# Patient Record
Sex: Male | Born: 1935 | Race: White | Hispanic: No | Smoking: Former smoker
Health system: Southern US, Community
[De-identification: ages and names within clinical notes are randomized; demographics above are authoritative.]

## PROBLEM LIST (undated history)

## (undated) DIAGNOSIS — E039 Hypothyroidism, unspecified: Secondary | ICD-10-CM

## (undated) DIAGNOSIS — J302 Other seasonal allergic rhinitis: Secondary | ICD-10-CM

## (undated) DIAGNOSIS — N200 Calculus of kidney: Principal | ICD-10-CM

## (undated) DIAGNOSIS — Z87442 Personal history of urinary calculi: Secondary | ICD-10-CM

## (undated) DIAGNOSIS — H3530 Unspecified macular degeneration: Secondary | ICD-10-CM

## (undated) DIAGNOSIS — I452 Bifascicular block: Secondary | ICD-10-CM

## (undated) DIAGNOSIS — M545 Low back pain, unspecified: Secondary | ICD-10-CM

## (undated) DIAGNOSIS — E079 Disorder of thyroid, unspecified: Secondary | ICD-10-CM

## (undated) DIAGNOSIS — M1990 Unspecified osteoarthritis, unspecified site: Secondary | ICD-10-CM

## (undated) DIAGNOSIS — I10 Essential (primary) hypertension: Secondary | ICD-10-CM

## (undated) DIAGNOSIS — Z8619 Personal history of other infectious and parasitic diseases: Secondary | ICD-10-CM

## (undated) DIAGNOSIS — C801 Malignant (primary) neoplasm, unspecified: Secondary | ICD-10-CM

## (undated) HISTORY — PX: CATARACT EXTRACTION: SUR2

## (undated) HISTORY — DX: Low back pain: M54.5

## (undated) HISTORY — DX: Calculus of kidney: N20.0

## (undated) HISTORY — DX: Low back pain, unspecified: M54.50

## (undated) HISTORY — DX: Bifascicular block: I45.2

## (undated) HISTORY — DX: Unspecified osteoarthritis, unspecified site: M19.90

## (undated) HISTORY — PX: COLONOSCOPY: SHX174

## (undated) HISTORY — DX: Disorder of thyroid, unspecified: E07.9

## (undated) HISTORY — DX: Essential (primary) hypertension: I10

## (undated) HISTORY — PX: KIDNEY STONE SURGERY: SHX686

---
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
## 1971-09-08 HISTORY — PX: OTHER SURGICAL HISTORY: SHX169

## 1992-09-07 HISTORY — PX: OTHER SURGICAL HISTORY: SHX169

## 1993-07-08 HISTORY — PX: OTHER SURGICAL HISTORY: SHX169

## 2003-01-30 LAB — HM COLONOSCOPY

## 2003-12-26 ENCOUNTER — Encounter: Payer: Self-pay | Admitting: Internal Medicine

## 2003-12-27 ENCOUNTER — Encounter: Admission: RE | Admit: 2003-12-27 | Discharge: 2003-12-27 | Payer: Self-pay | Admitting: Internal Medicine

## 2004-01-15 ENCOUNTER — Encounter (HOSPITAL_COMMUNITY): Admission: RE | Admit: 2004-01-15 | Discharge: 2004-04-14 | Payer: Self-pay | Admitting: Internal Medicine

## 2004-01-30 ENCOUNTER — Encounter: Payer: Self-pay | Admitting: Internal Medicine

## 2004-02-04 ENCOUNTER — Encounter (INDEPENDENT_AMBULATORY_CARE_PROVIDER_SITE_OTHER): Payer: Self-pay | Admitting: Specialist

## 2004-02-04 ENCOUNTER — Ambulatory Visit (HOSPITAL_COMMUNITY): Admission: RE | Admit: 2004-02-04 | Discharge: 2004-02-04 | Payer: Self-pay | Admitting: Internal Medicine

## 2004-02-26 ENCOUNTER — Ambulatory Visit (HOSPITAL_COMMUNITY): Admission: RE | Admit: 2004-02-26 | Discharge: 2004-02-26 | Payer: Self-pay | Admitting: Endocrinology

## 2004-03-06 ENCOUNTER — Ambulatory Visit (HOSPITAL_COMMUNITY): Admission: RE | Admit: 2004-03-06 | Discharge: 2004-03-06 | Payer: Self-pay | Admitting: Internal Medicine

## 2004-03-06 ENCOUNTER — Encounter (INDEPENDENT_AMBULATORY_CARE_PROVIDER_SITE_OTHER): Payer: Self-pay | Admitting: Specialist

## 2004-07-15 ENCOUNTER — Ambulatory Visit: Payer: Self-pay | Admitting: Internal Medicine

## 2004-08-13 ENCOUNTER — Ambulatory Visit: Payer: Self-pay | Admitting: Internal Medicine

## 2004-08-20 ENCOUNTER — Ambulatory Visit: Payer: Self-pay | Admitting: Internal Medicine

## 2004-08-27 ENCOUNTER — Ambulatory Visit: Payer: Self-pay | Admitting: Internal Medicine

## 2004-09-07 ENCOUNTER — Emergency Department (HOSPITAL_COMMUNITY): Admission: EM | Admit: 2004-09-07 | Discharge: 2004-09-07 | Payer: Self-pay | Admitting: Family Medicine

## 2004-09-11 ENCOUNTER — Ambulatory Visit: Payer: Self-pay | Admitting: Internal Medicine

## 2004-12-16 ENCOUNTER — Ambulatory Visit: Payer: Self-pay | Admitting: Internal Medicine

## 2004-12-26 ENCOUNTER — Ambulatory Visit: Payer: Self-pay | Admitting: Internal Medicine

## 2005-01-06 ENCOUNTER — Ambulatory Visit: Payer: Self-pay | Admitting: Internal Medicine

## 2005-01-09 ENCOUNTER — Ambulatory Visit: Payer: Self-pay | Admitting: Internal Medicine

## 2005-01-16 ENCOUNTER — Ambulatory Visit: Payer: Self-pay | Admitting: Internal Medicine

## 2005-01-21 ENCOUNTER — Ambulatory Visit: Payer: Self-pay | Admitting: Internal Medicine

## 2005-02-12 ENCOUNTER — Ambulatory Visit: Payer: Self-pay | Admitting: Internal Medicine

## 2005-04-23 ENCOUNTER — Ambulatory Visit: Payer: Self-pay | Admitting: Internal Medicine

## 2005-07-11 ENCOUNTER — Ambulatory Visit: Payer: Self-pay | Admitting: Internal Medicine

## 2005-12-10 ENCOUNTER — Ambulatory Visit: Payer: Self-pay | Admitting: Internal Medicine

## 2005-12-17 ENCOUNTER — Ambulatory Visit: Payer: Self-pay | Admitting: Internal Medicine

## 2006-02-04 ENCOUNTER — Ambulatory Visit: Payer: Self-pay | Admitting: Internal Medicine

## 2006-02-22 ENCOUNTER — Encounter: Admission: RE | Admit: 2006-02-22 | Discharge: 2006-02-22 | Payer: Self-pay | Admitting: Internal Medicine

## 2006-06-15 ENCOUNTER — Ambulatory Visit: Payer: Self-pay | Admitting: Internal Medicine

## 2006-06-15 LAB — CONVERTED CEMR LAB
ALT: 21 units/L (ref 0–40)
Albumin: 3.5 g/dL (ref 3.5–5.2)
Alkaline Phosphatase: 62 units/L (ref 39–117)
Cholesterol: 131 mg/dL (ref 0–200)
LDL Cholesterol: 75 mg/dL (ref 0–99)
Total Protein: 6.8 g/dL (ref 6.0–8.3)

## 2006-06-22 ENCOUNTER — Ambulatory Visit: Payer: Self-pay | Admitting: Internal Medicine

## 2006-12-21 ENCOUNTER — Ambulatory Visit: Payer: Self-pay | Admitting: Internal Medicine

## 2006-12-21 LAB — CONVERTED CEMR LAB
ALT: 20 units/L (ref 0–40)
AST: 19 units/L (ref 0–37)
Alkaline Phosphatase: 61 units/L (ref 39–117)
BUN: 14 mg/dL (ref 6–23)
Basophils Relative: 1.6 % — ABNORMAL HIGH (ref 0.0–1.0)
Bilirubin, Direct: 0.1 mg/dL (ref 0.0–0.3)
CO2: 28 meq/L (ref 19–32)
Calcium: 9 mg/dL (ref 8.4–10.5)
Chloride: 109 meq/L (ref 96–112)
Creatinine, Ser: 0.8 mg/dL (ref 0.4–1.5)
Eosinophils Relative: 2.1 % (ref 0.0–5.0)
Glucose, Bld: 104 mg/dL — ABNORMAL HIGH (ref 70–99)
LDL Cholesterol: 59 mg/dL (ref 0–99)
Lymphocytes Relative: 29.1 % (ref 12.0–46.0)
Neutro Abs: 2.7 10*3/uL (ref 1.4–7.7)
Platelets: 231 10*3/uL (ref 150–400)
RBC: 4.6 M/uL (ref 4.22–5.81)
RDW: 12.7 % (ref 11.5–14.6)
Total Bilirubin: 0.7 mg/dL (ref 0.3–1.2)
Total Protein: 6.6 g/dL (ref 6.0–8.3)
Triglycerides: 43 mg/dL (ref 0–149)
VLDL: 9 mg/dL (ref 0–40)
WBC: 4.8 10*3/uL (ref 4.5–10.5)

## 2006-12-28 ENCOUNTER — Ambulatory Visit: Payer: Self-pay | Admitting: Internal Medicine

## 2007-02-28 ENCOUNTER — Ambulatory Visit: Payer: Self-pay | Admitting: Internal Medicine

## 2007-03-30 ENCOUNTER — Telehealth (INDEPENDENT_AMBULATORY_CARE_PROVIDER_SITE_OTHER): Payer: Self-pay | Admitting: *Deleted

## 2007-06-20 DIAGNOSIS — I10 Essential (primary) hypertension: Secondary | ICD-10-CM | POA: Insufficient documentation

## 2007-06-20 DIAGNOSIS — N200 Calculus of kidney: Secondary | ICD-10-CM

## 2007-06-20 HISTORY — DX: Calculus of kidney: N20.0

## 2007-07-01 ENCOUNTER — Ambulatory Visit: Payer: Self-pay | Admitting: Internal Medicine

## 2007-07-01 DIAGNOSIS — M545 Low back pain, unspecified: Secondary | ICD-10-CM | POA: Insufficient documentation

## 2007-07-01 DIAGNOSIS — M1990 Unspecified osteoarthritis, unspecified site: Secondary | ICD-10-CM | POA: Insufficient documentation

## 2007-09-08 HISTORY — PX: ROTATOR CUFF REPAIR: SHX139

## 2007-10-10 ENCOUNTER — Ambulatory Visit: Payer: Self-pay | Admitting: Internal Medicine

## 2007-10-10 DIAGNOSIS — M7500 Adhesive capsulitis of unspecified shoulder: Secondary | ICD-10-CM | POA: Insufficient documentation

## 2007-10-14 ENCOUNTER — Telehealth: Payer: Self-pay | Admitting: Internal Medicine

## 2007-10-21 ENCOUNTER — Encounter: Admission: RE | Admit: 2007-10-21 | Discharge: 2007-10-21 | Payer: Self-pay | Admitting: Internal Medicine

## 2007-10-25 DIAGNOSIS — M67919 Unspecified disorder of synovium and tendon, unspecified shoulder: Secondary | ICD-10-CM | POA: Insufficient documentation

## 2007-10-25 DIAGNOSIS — M719 Bursopathy, unspecified: Secondary | ICD-10-CM

## 2007-10-26 ENCOUNTER — Telehealth (INDEPENDENT_AMBULATORY_CARE_PROVIDER_SITE_OTHER): Payer: Self-pay | Admitting: *Deleted

## 2007-11-10 ENCOUNTER — Encounter: Payer: Self-pay | Admitting: Internal Medicine

## 2007-12-19 ENCOUNTER — Ambulatory Visit: Payer: Self-pay | Admitting: Internal Medicine

## 2007-12-19 LAB — CONVERTED CEMR LAB
Basophils Absolute: 0 10*3/uL (ref 0.0–0.1)
Basophils Relative: 0.4 % (ref 0.0–1.0)
Eosinophils Absolute: 0.3 10*3/uL (ref 0.0–0.7)
Eosinophils Relative: 6.8 % — ABNORMAL HIGH (ref 0.0–5.0)
HDL: 49.6 mg/dL (ref >39.0)
LDL Cholesterol: 79 mg/dL (ref 0–99)
Lymphocytes Relative: 36.7 % (ref 12.0–46.0)
MCV: 91.8 fL (ref 78.0–100.0)
Neutrophils Relative %: 43.8 % (ref 43.0–77.0)
Platelets: 240 10*3/uL (ref 150–400)
RBC: 4.44 M/uL (ref 4.22–5.81)
VLDL: 4 mg/dL (ref 0–40)
WBC: 4.6 10*3/uL (ref 4.5–10.5)

## 2007-12-26 ENCOUNTER — Ambulatory Visit: Payer: Self-pay | Admitting: Internal Medicine

## 2008-06-19 ENCOUNTER — Ambulatory Visit: Payer: Self-pay | Admitting: Internal Medicine

## 2008-06-26 ENCOUNTER — Ambulatory Visit: Payer: Self-pay | Admitting: Internal Medicine

## 2008-06-26 DIAGNOSIS — R498 Other voice and resonance disorders: Secondary | ICD-10-CM | POA: Insufficient documentation

## 2008-06-29 ENCOUNTER — Encounter: Admission: RE | Admit: 2008-06-29 | Discharge: 2008-06-29 | Payer: Self-pay | Admitting: Internal Medicine

## 2008-07-10 ENCOUNTER — Encounter: Payer: Self-pay | Admitting: Internal Medicine

## 2008-07-12 ENCOUNTER — Telehealth: Payer: Self-pay | Admitting: Internal Medicine

## 2008-08-20 ENCOUNTER — Encounter: Payer: Self-pay | Admitting: Internal Medicine

## 2008-09-07 HISTORY — PX: THYROIDECTOMY: SHX17

## 2008-10-16 ENCOUNTER — Ambulatory Visit (HOSPITAL_COMMUNITY): Admission: RE | Admit: 2008-10-16 | Discharge: 2008-10-17 | Payer: Self-pay | Admitting: Surgery

## 2008-10-16 ENCOUNTER — Encounter (INDEPENDENT_AMBULATORY_CARE_PROVIDER_SITE_OTHER): Payer: Self-pay | Admitting: Surgery

## 2008-10-16 ENCOUNTER — Encounter: Payer: Self-pay | Admitting: Internal Medicine

## 2008-10-23 ENCOUNTER — Encounter: Payer: Self-pay | Admitting: Internal Medicine

## 2008-10-26 ENCOUNTER — Ambulatory Visit: Payer: Self-pay | Admitting: Oncology

## 2008-10-29 ENCOUNTER — Encounter: Payer: Self-pay | Admitting: Internal Medicine

## 2008-11-07 ENCOUNTER — Encounter (HOSPITAL_COMMUNITY): Admission: RE | Admit: 2008-11-07 | Discharge: 2009-01-11 | Payer: Self-pay | Admitting: Oncology

## 2008-11-20 ENCOUNTER — Ambulatory Visit: Payer: Self-pay | Admitting: Endocrinology

## 2008-11-20 DIAGNOSIS — C73 Malignant neoplasm of thyroid gland: Secondary | ICD-10-CM | POA: Insufficient documentation

## 2008-11-20 DIAGNOSIS — E890 Postprocedural hypothyroidism: Secondary | ICD-10-CM | POA: Insufficient documentation

## 2008-11-27 ENCOUNTER — Encounter: Payer: Self-pay | Admitting: Endocrinology

## 2008-12-17 ENCOUNTER — Ambulatory Visit: Payer: Self-pay | Admitting: Endocrinology

## 2008-12-19 LAB — CONVERTED CEMR LAB: TSH: 1.7 microintl units/mL (ref 0.35–5.50)

## 2008-12-21 ENCOUNTER — Telehealth (INDEPENDENT_AMBULATORY_CARE_PROVIDER_SITE_OTHER): Payer: Self-pay | Admitting: *Deleted

## 2008-12-27 ENCOUNTER — Ambulatory Visit: Payer: Self-pay | Admitting: Internal Medicine

## 2009-02-22 ENCOUNTER — Encounter: Payer: Self-pay | Admitting: Internal Medicine

## 2009-02-22 ENCOUNTER — Ambulatory Visit: Payer: Self-pay | Admitting: Internal Medicine

## 2009-02-22 DIAGNOSIS — C44599 Other specified malignant neoplasm of skin of other part of trunk: Secondary | ICD-10-CM | POA: Insufficient documentation

## 2009-03-27 ENCOUNTER — Encounter: Payer: Self-pay | Admitting: Internal Medicine

## 2009-03-27 ENCOUNTER — Ambulatory Visit: Payer: Self-pay | Admitting: Internal Medicine

## 2009-04-11 ENCOUNTER — Encounter: Payer: Self-pay | Admitting: Endocrinology

## 2009-06-11 ENCOUNTER — Encounter: Payer: Self-pay | Admitting: Internal Medicine

## 2009-10-16 ENCOUNTER — Encounter: Payer: Self-pay | Admitting: Internal Medicine

## 2009-12-24 ENCOUNTER — Ambulatory Visit: Payer: Self-pay | Admitting: Internal Medicine

## 2009-12-24 LAB — CONVERTED CEMR LAB
Albumin: 3.6 g/dL (ref 3.5–5.2)
BUN: 17 mg/dL (ref 6–23)
Basophils Absolute: 0.1 10*3/uL (ref 0.0–0.1)
Bilirubin Urine: NEGATIVE
Blood in Urine, dipstick: NEGATIVE
CO2: 27 meq/L (ref 19–32)
Calcium: 8.8 mg/dL (ref 8.4–10.5)
Cholesterol: 129 mg/dL (ref 0–200)
Eosinophils Absolute: 0.2 10*3/uL (ref 0.0–0.7)
Glucose, Bld: 94 mg/dL (ref 70–99)
Glucose, Urine, Semiquant: NEGATIVE
HCT: 39.6 % (ref 39.0–52.0)
HDL: 62.5 mg/dL (ref >39.00)
Hemoglobin: 13.5 g/dL (ref 13.0–17.0)
Ketones, urine, test strip: NEGATIVE
Lymphocytes Relative: 32.7 % (ref 12.0–46.0)
Lymphs Abs: 1.4 10*3/uL (ref 0.7–4.0)
MCHC: 34.1 g/dL (ref 30.0–36.0)
Neutro Abs: 2.3 10*3/uL (ref 1.4–7.7)
RDW: 14.2 % (ref 11.5–14.6)
Sodium: 142 meq/L (ref 135–145)
Specific Gravity, Urine: 1.02
TSH: 5.58 microintl units/mL — ABNORMAL HIGH (ref 0.35–5.50)
WBC Urine, dipstick: NEGATIVE
pH: 5.5

## 2010-01-02 ENCOUNTER — Ambulatory Visit: Payer: Self-pay | Admitting: Internal Medicine

## 2010-01-03 ENCOUNTER — Ambulatory Visit: Payer: Self-pay | Admitting: Internal Medicine

## 2010-03-24 ENCOUNTER — Ambulatory Visit: Payer: Self-pay | Admitting: Internal Medicine

## 2010-03-24 LAB — CONVERTED CEMR LAB: TSH: 52.93 microintl units/mL — ABNORMAL HIGH (ref 0.35–5.50)

## 2010-03-25 ENCOUNTER — Encounter (HOSPITAL_COMMUNITY): Admission: RE | Admit: 2010-03-25 | Discharge: 2010-06-05 | Payer: Self-pay | Admitting: Internal Medicine

## 2010-06-16 ENCOUNTER — Telehealth: Payer: Self-pay | Admitting: Internal Medicine

## 2010-06-17 ENCOUNTER — Ambulatory Visit: Payer: Self-pay | Admitting: Internal Medicine

## 2010-09-28 ENCOUNTER — Encounter: Payer: Self-pay | Admitting: Internal Medicine

## 2010-10-05 LAB — CONVERTED CEMR LAB
ALT: 20 units/L (ref 0–53)
BUN: 14 mg/dL (ref 6–23)
Basophils Absolute: 0 10*3/uL (ref 0.0–0.1)
Bilirubin, Direct: 0.1 mg/dL (ref 0.0–0.3)
Chloride: 107 meq/L (ref 96–112)
Cholesterol: 144 mg/dL (ref 0–200)
Creatinine, Ser: 0.8 mg/dL (ref 0.4–1.5)
Eosinophils Absolute: 0.2 10*3/uL (ref 0.0–0.7)
Glucose, Bld: 102 mg/dL — ABNORMAL HIGH (ref 70–99)
HCT: 44 % (ref 39.0–52.0)
LDL Cholesterol: 80 mg/dL (ref 0–99)
Lymphs Abs: 1.7 10*3/uL (ref 0.7–4.0)
MCHC: 34.7 g/dL (ref 30.0–36.0)
MCV: 91.5 fL (ref 78.0–100.0)
Monocytes Absolute: 0.5 10*3/uL (ref 0.1–1.0)
Neutrophils Relative %: 56.3 % (ref 43.0–77.0)
Platelets: 226 10*3/uL (ref 150.0–400.0)
Potassium: 4.2 meq/L (ref 3.5–5.1)
RDW: 13.9 % (ref 11.5–14.6)
TSH: 1.52 microintl units/mL (ref 0.35–5.50)
Total Bilirubin: 0.9 mg/dL (ref 0.3–1.2)
Triglycerides: 37 mg/dL (ref 0.0–149.0)
VLDL: 7.4 mg/dL (ref 0.0–40.0)

## 2010-10-07 NOTE — Letter (Signed)
Summary: [facility]   Imported By: Maryln Gottron 11/04/2009 12:49:28  _____________________________________________________________________  External Attachment:    Type:   Image     Comment:   External Document

## 2010-10-07 NOTE — Assessment & Plan Note (Signed)
Summary: hip inj/ok per bonnie/njr   Vital Signs:  Patient profile:   77 year old male Height:      68 inches Weight:      196 pounds BMI:     29.91 Temp:     98.2 degrees F oral Pulse rate:   72 / minute Resp:     14 per minute BP sitting:   130 / 80  (left arm)  Vitals Entered By: Willy Eddy, LPN (June 17, 2010 4:31 PM) CC: c/o left hip pain, Hypertension Management Is Patient Diabetic? No   Primary Care [place]:  Stacie Glaze MD  CC:  c/o left hip pain and Hypertension Management.  History of Present Illness: the pt has increased  hip pain fro several months that has been responding to motrin 600 for daily use and this is controlling pain Back dz is a contributing factor he is requestimg evaluation fro a hip injection blood pressure stable on the nsaids  Hypertension History:      He denies headache, chest pain, palpitations, dyspnea with exertion, orthopnea, PND, peripheral edema, visual symptoms, neurologic problems, syncope, and side effects from treatment.        Positive major cardiovascular risk factors include male age 59 years old or older and hypertension.  Negative major cardiovascular risk factors include negative family history for ischemic heart disease and non-tobacco-user status.     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) 5)  Hoarseness, Chronic  (ICD-784.49) 6)  Preventive Health Care  (ICD-V70.0) 7)  Rotator Cuff Syndrome, Left  (ICD-726.10) 8)  Adhesive Capsulitis of Shoulder  (ICD-726.0) 9)  Osteoarthritis  (ICD-715.90) 10)  Low Back Pain  (ICD-724.2) 11)   Hypertension  (ICD-401.9) 12)  Gout  (ICD-274.9) 13)  Family History Diabetes 1st Degree Relative  (ICD-V18.0) 14)  Family History of Cad Male 1st Degree Relative <50  (ICD-V17.3)  Current Problems (verified): 1)  Hip Pain, Left  (ICD-719.45) 2)  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 Once Daily 2)  Adult Aspirin Low Strength 81 Mg  Tbdp (Aspirin) .... Once Daily 3)  Lotensin 20 Mg  Tabs (Benazepril Hcl) .... One  By Mouth Daily 4)  Icaps  Caps (Multiple Vitamins-Minerals) .... 2 Once Daily 5)  Fish Oil 1000 Mg Caps (Omega-3 Fatty Acids) .Marland Kitchen.. 1 Once Daily 6)  Levothyroxine Sodium 150 Mcg Tabs (Levothyroxine Sodium) .... One By Mouth Daily( Overrides The )  Current Medications (verified): 1)  Allopurinol 300 Mg  Tabs (Allopurinol) .... Once Daily 2)  Adult Aspirin Low Strength 81 Mg  Tbdp (Aspirin) .... Once Daily 3)  Lotensin 20 Mg  Tabs (Benazepril Hcl) .... One  By Mouth Daily 4)  Icaps  Caps (Multiple Vitamins-Minerals) .... 2 Once Daily 5)  Fish Oil 1000 Mg Caps (Omega-3 Fatty Acids) .Marland Kitchen.. 1 Once Daily 6)  Levothyroxine Sodium 150 Mcg Tabs (Levothyroxine Sodium) .... One By Mouth Daily( Overrides The ) 7)  Avastin .05 in Eye Monthly  Allergies (verified): 1)  ! Pcn  Past History:  Family History: Last updated: 06/20/2007 Family History Thyroid  disease Family History of Anemia/FE deficiency Family History of CAD Male 1st degree relative <50 Family History Diabetes 1st degree relative  Social History: Last updated: 07/01/2007 Retired Single Former Smoker Alcohol use-yes Domestic Partner  Risk Factors: Smoking Status: quit (06/17/2010) Passive Smoke Exposure: no (06/17/2010)  Past medical, surgical, family and social histories (including risk factors) reviewed, and no changes noted (except as noted below).  Past Medical History: Reviewed history from 12/27/2008 and no changes required. Gout Hypertension Low back pain Osteoarthritis Hypothyroidism right bundle branch block  Past Surgical History: Reviewed history from 12/26/2007 and no changes required. Colonoscopy Appendectomy Rotator cuff repair  Family History: Reviewed history from 06/20/2007 and no changes required. Family History Thyroid disease Family History of Anemia/FE deficiency Family History of CAD Male 1st degree relative <50 Family History Diabetes 1st degree relative  Social History: Reviewed history from 07/01/2007 and no changes required. 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    Physical Exam  General:  Well developed, well nourished, in no acute distress.  Head:  head: no deformity eyes: no periorbital swelling, no proptosis external nose and ears are normal mouth: no lesion seen  Eyes:   pupils equal and pupils round.   Ears:  R ear normal and L ear normal.   Nose:  no nasal discharge.  no mucosal edema.   Mouth:  pharynx pink and moist.   Neck:  a healed scar is present.  i do not appreciate a nodule in the thyroid or elsewhere in the neck  Lungs:  Clear to auscultation bilaterally. Normal respiratory effort.  Heart:  Regular rate and rhythm without murmurs or gallops noted. Normal S1,S2.   Msk:  tender groin tendon no mass or adenpopathy pai n with eversion of hipno joint swelling, no joint warmth, no redness over joints, decreased ROM, and joint tenderness.   Neurologic:  No cranial nerve deficits noted. Station and gait are normal. Plantar reflexes are down-going bilaterally. DTRs are symmetrical throughout. Sensory, motor and coordinative functions appear intact.   Impression & Recommendations:  Problem # 1:  HIP PAIN, LEFT (ICD-719.45) Assessment Deteriorated Informed consent obtained and then the ligamient in the femoral triagle were palpated and the groin tendon  was prepped in a sterile manor and 40 mg depo and 1/2 cc 1% lidocaine injected into the synovial space. After care discussed. Pt tolerated procedure well.  His updated medication list for this problem includes:    Adult Aspirin Low Strength 81 Mg Tbdp (Aspirin) ..... Once daily  Discussed use  of medications, application of heat or cold, and exercises.   Orders: Injection, Tendon / Ligament ([address]) Depo- Medrol 40mg  (J1030)  Problem # 2:  LOW BACK PAIN (ICD-724.2) Assessment: Unchanged  His updated medication list for this problem includes:    Adult Aspirin Low Strength 81 Mg Tbdp (Aspirin) ..... Once daily  Discussed use of moist heat or ice, modified activities, medications, and stretching/strengthening exercises. Back care instructions given. To be seen in 2 weeks if no improvement; sooner if worsening of symptoms.   Problem # 3:  HYPERTENSION (ICD-401.9) Assessment: Unchanged  His updated  medication list for this problem includes:    Lotensin 20 Mg Tabs (Benazepril hcl) ..... One  by mouth daily  BP today: 130/80 Prior BP: 116/80 (01/02/2010)  10 Yr Risk Heart Disease: 9 % Prior 10 Yr Risk Heart Disease: 7 % (01/02/2010)  Labs Reviewed: K+: 4.4 (12/24/2009) Creat: : 0.8 (12/24/2009)   Chol: 129 (12/24/2009)   HDL: 62.50 (12/24/2009)   LDL: 59 (12/24/2009)   TG: 40.0 (12/24/2009)  Complete Medication List: 1)  Allopurinol 300 Mg Tabs (Allopurinol) .... Once daily 2)  Adult Aspirin Low Strength 81 Mg Tbdp (Aspirin) .... Once daily 3)  Lotensin 20 Mg Tabs (Benazepril hcl) .... One  by mouth daily 4)  Icaps Caps (Multiple vitamins-minerals) .... 2 once daily 5)  Fish Oil 1000 Mg Caps (Omega-3 fatty acids) .Marland Kitchen.. 1 once daily 6)  Levothyroxine Sodium 150 Mcg Tabs (Levothyroxine sodium) .... One by mouth daily( overrides the ) 7)  Avastin .05 in Eye Monthly   Hypertension Assessment/Plan:      The patient's hypertensive risk group is category B: At least one risk factor (excluding diabetes) with no target organ damage.  His calculated 10 year risk of coronary heart disease is 9 %.  Today's blood pressure is 130/80.  His blood pressure goal is < 140/90.  Patient Instructions: 1)  ice site for 15 min every couple of hours tonight 2)  no exertion for 2-3 days 3)  40 mf of depomedrol in hip joint

## 2010-10-07 NOTE — Assessment & Plan Note (Signed)
Summary: cpx/njr......Jay KitchenPT RSC - BUMPED // RS/PT [facility] FROM BMP/CJR/pt re...   Vital Signs:  Patient profile:   77 year old male Height:      68 inches Weight:      196 pounds BMI:     29.91 Temp:     98.2 degrees F oral Pulse rate:   72 / minute Resp:     14 per minute BP sitting:   116 / 80  (left arm)  Vitals Entered By: Willy Eddy, LPN (January 02, 2010 2:11 PM) CC: annual visit for disease mangement, Hypertension Management   Primary Care Provider:  Stacie Glaze MD  CC:  annual visit for disease mangement and Hypertension Management.  History of Present Illness: In sept he pulled a groin muscle and this has caused increased difficulty with exercize the pain is preceiptated by movements and have a variable duration he has no hx of hip disorder the pt has a hx of surgical hypothroidism with stable prior tsh   The pt was asked about all immunizations, health maint. services that are appropriate to their age and was given guidance on diet exercize  and weight management   Hypertension History:      Positive major cardiovascular risk factors include male age 55 years old or older and hypertension.  Negative major cardiovascular risk factors include negative family history for ischemic heart disease and non-tobacco-user status.    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 Once Daily 2)  Adult Aspirin Low Strength 81 Mg  Tbdp (Aspirin) .... Once Daily 3)  Lotensin 20 Mg  Tabs (Benazepril Hcl) .... One  By Mouth Daily 4)  Icaps  Caps (Multiple Vitamins-Minerals) .... 2 Once Daily 5)  Fish Oil 1000 Mg Caps (Omega-3 Fatty Acids) .Jay Webster.. 1 Once Daily 6)  Levothyroxine Sodium 125 Mcg Tabs (Levothyroxine Sodium) .... Qd  Current Medications (verified): 1)  Allopurinol 300 Mg  Tabs (Allopurinol) .... Once Daily 2)  Adult Aspirin Low Strength 81 Mg  Tbdp (Aspirin) .... Once Daily 3)  Lotensin 20 Mg  Tabs (Benazepril Hcl) .... One  By Mouth Daily 4)  Icaps  Caps (Multiple Vitamins-Minerals) .... 2 Once Daily 5)  Fish Oil 1000 Mg Caps (Omega-3 Fatty Acids) .Jay Webster.. 1 Once Daily 6)  Levothyroxine Sodium 150 Mcg Tabs (Levothyroxine Sodium) .... One By Mouth Daily( Overrides The )  Allergies (verified): 1)  ! 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  Past Medical History: Reviewed history from 12/27/2008 and no changes required. Gout Hypertension Low back pain Osteoarthritis Hypothyroidism right bundle branch block  Past Surgical History: Reviewed history from 12/26/2007 and no changes required. Colonoscopy Appendectomy Rotator cuff repair  Family  History: Reviewed history from 06/20/2007 and no changes required. Family History Thyroid disease Family History of Anemia/FE deficiency Family History of CAD Male 1st degree relative <50 Family History Diabetes 1st degree relative  Social History: Reviewed history from 07/01/2007 and no changes required. 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    Physical Exam  General:  Well developed, well nourished, in no acute distress.  Head:  head: no deformity eyes: no periorbital swelling, no proptosis external nose and ears are normal mouth: no lesion seen  Eyes:  pupils equal and pupils round.   Ears:  R ear normal and L ear normal.   Nose:  no nasal discharge.  no mucosal edema.   Mouth:  pharynx pink and moist.   Neck:  a healed scar is present.  i do not appreciate a nodule in the thyroid or elsewhere in the neck  Chest Wall:  barrel-chest deformity.   Lungs:  Clear to auscultation bilaterally. Normal respiratory effort.  Heart:  Regular rate and rhythm without murmurs or gallops noted. Normal S1,S2.   Abdomen:  soft, non-tender, and normal bowel sounds.   Msk:  Normal muscle tone and bulk.  Normal posture, no vertebral or CVA tenderness.  No joint swelling.  Pulses:  R and L carotid,radial,femoral,dorsalis pedis and posterior tibial pulses are full and equal bilaterally Extremities:  trace right pedal edema and trace left pedal edema.     Neurologic:  cn 2-12 grossly intact.   readily moves all 4's.   sensation is intact to touch on the feet  Skin:  TOW MOLE N CHAEST AND ONE ON BACK IDENTIFIED Cervical Nodes:  No significant adenopathy.  Axillary Nodes:  No palpable lymphadenopathy Inguinal Nodes:  No significant adenopathy Psych:  Alert and cooperative; normal mood and affect; normal attention span and concentration.     Impression & Recommendations:  Problem # 1:  PREVENTIVE HEALTH CARE (ICD-V70.0) The pt was asked about all immunizations, health maint. services that are appropriate to their age and was given guidance on diet exercize  and weight management  Colonoscopy: normal (01/30/2003) Td Booster: Historical (09/07/2005)   Flu Vax: Historical (05/17/2009)   Pneumovax: Historical (09/07/2006) Chol: 129 (12/24/2009)   HDL: 62.50 (12/24/2009)   LDL: 59 (12/24/2009)   TG: 40.0 (12/24/2009) TSH: 5.58 (12/24/2009)   PSA: 1.39 (12/24/2009) Next Colonoscopy due:: 02/2013 (12/27/2008)  Discussed using sunscreen, use of alcohol, drug use, self testicular exam, routine dental care, routine eye care, routine physical exam, seat belts, multiple vitamins, osteoporosis prevention, adequate calcium intake in diet, and recommendations for immunizations.  Discussed exercise and checking cholesterol.  Discussed gun safety, safe sex, and contraception. Also recommend checking PSA.  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 Qd His updated medication list for this problem includes:    Levothyroxine Sodium 150 Mcg Tabs (Levothyroxine sodium) ..... One by mouth daily( overrides the )  Labs Reviewed: TSH: 5.58 (12/24/2009)    Chol: 129 (12/24/2009)   HDL: 62.50 (12/24/2009)   LDL: 59 (12/24/2009)   TG: 40.0 (12/24/2009)  Orders: Radiology Referral (Radiology)  Problem # 3:   OSTEOARTHRITIS (ICD-715.90)  ont eh hip sus[pect sput His updated medication list for this problem includes:    Adult Aspirin Low Strength 81 Mg Tbdp (Aspirin) ..... Once daily  Discussed use of medications, application of heat or cold, and exercises.   Orders: T-Hip Comp Left Min 2-views (73510TC)  Complete Medication List: 1)  Allopurinol 300 Mg Tabs (Allopurinol) .... Once daily 2)  Adult Aspirin Low Strength 81 Mg Tbdp (Aspirin) .... Once daily 3)  Lotensin 20 Mg Tabs (Benazepril hcl) .... One  by mouth daily 4)  Icaps Caps (Multiple vitamins-minerals) .... 2 once daily 5)  Fish Oil 1000 Mg Caps (Omega-3 fatty acids) .Jay Webster.. 1 once daily 6)  Levothyroxine Sodium 150 Mcg Tabs (Levothyroxine sodium) .... One by mouth daily( overrides the )  Hypertension Assessment/Plan:      The patient's hypertensive risk group is category B: At least one risk factor (excluding diabetes) with no target organ damage.  His calculated 10 year risk of coronary heart disease is 7 %.  Today's blood pressure is 116/80.  His blood pressure goal is < 140/90.   Patient Instructions: 1)  Please schedule a follow-up appointment in 2 months. 2)  TSH prior to visit, ICD-9:244.8 Prescriptions: LEVOTHYROXINE SODIUM 150 MCG TABS (LEVOTHYROXINE SODIUM) one by mouth daily( overrides the )  #90 x 3   Entered and Authorized by:   Stacie Glaze MD   Signed by:   Stacie Glaze MD on 01/02/2010   Method used:   Electronically to        [id number]* (retail)       [address]       [place], [place]  [address]       Ph: [phone]       Fax: [phone]   RxID:   [id number] LEVOTHYROXINE SODIUM 125 MCG TABS (LEVOTHYROXINE SODIUM) qd  #90 x 3   Entered by:   Willy Eddy, LPN   Authorized by:   Stacie Glaze MD   Signed by:   Willy Eddy, LPN on 43/32/9518   Method used:   Electronically to        [id number]* (retail)       [address]       [place], [place]  [address]       Ph: [phone]       Fax: [phone]   RxID:   [id number] LOTENSIN 20 MG  TABS (BENAZEPRIL HCL) one  by mouth daily  #90 x 3   Entered by:   Willy Eddy, LPN   Authorized by:   Stacie Glaze MD   Signed by:   Willy Eddy, LPN on 17/61/6073   Method used:   Electronically to        [id number]* (retail)       [address]       [place],  [place]  [address]       Ph: [phone]       Fax: [phone]   RxID:   [id number] ALLOPURINOL 300 MG  TABS (ALLOPURINOL) once daily  #90 x 3   Entered by:   Willy Eddy, LPN   Authorized by:   Stacie Glaze MD   Signed by:   Willy Eddy, LPN on 84/13/2440   Method used:   Electronically to        [facility] [address] (retail)       [address]       [place], [place]  [address]       Ph: [phone]       Fax: [phone]   RxID:   [id number]

## 2010-10-07 NOTE — Progress Notes (Signed)
Summary: ?hip inj  Phone Note Call from Patient Call back at Home Phone [phone]   Caller: Patient Call For: Stacie Glaze MD Summary of Call: pt had xray on hip in april shown bone spur. Pt would like to come in for hip inj can I sch? Initial call taken by: Heron Sabins,  June 16, 2010 8:04 AM  Follow-up for Phone Call        may use 4 pm tomorrow Follow-up by: Willy Eddy, LPN,  June 16, 2010 8:42 AM  Additional Follow-up for Phone Call Additional follow up Details #1::        sch for 06-17-2010 4pm pt is aware Additional Follow-up by: Heron Sabins,  June 16, 2010 8:46 AM

## 2010-12-23 LAB — COMPREHENSIVE METABOLIC PANEL
AST: 20 U/L (ref 0–37)
Albumin: 3.6 g/dL (ref 3.5–5.2)
Alkaline Phosphatase: 72 U/L (ref 39–117)
Chloride: 105 mEq/L (ref 96–112)
Creatinine, Ser: 0.75 mg/dL (ref 0.4–1.5)
GFR calc Af Amer: >60 mL/min (ref >60)
Potassium: 4.4 mEq/L (ref 3.5–5.1)
Sodium: 136 mEq/L (ref 135–145)
Total Bilirubin: 0.7 mg/dL (ref 0.3–1.2)

## 2010-12-23 LAB — CBC
Platelets: 227 10*3/uL (ref 150–400)
WBC: 5.8 10*3/uL (ref 4.0–10.5)

## 2010-12-23 LAB — URINALYSIS, ROUTINE W REFLEX MICROSCOPIC
Bilirubin Urine: NEGATIVE
Ketones, ur: NEGATIVE mg/dL
Nitrite: NEGATIVE
Protein, ur: NEGATIVE mg/dL
Urobilinogen, UA: 0.2 mg/dL (ref 0.0–1.0)
pH: 7.5 (ref 5.0–8.0)

## 2010-12-23 LAB — DIFFERENTIAL
Basophils Absolute: 0 10*3/uL (ref 0.0–0.1)
Basophils Relative: 1 % (ref 0–1)
Eosinophils Absolute: 0.1 10*3/uL (ref 0.0–0.7)
Monocytes Relative: 9 % (ref 3–12)
Neutro Abs: 3.5 10*3/uL (ref 1.7–7.7)
Neutrophils Relative %: 60 % (ref 43–77)

## 2010-12-23 LAB — PROTIME-INR
INR: 1.1 (ref 0.00–1.49)
Prothrombin Time: 14.4 seconds (ref 11.6–15.2)

## 2010-12-25 ENCOUNTER — Encounter: Payer: Self-pay | Admitting: Internal Medicine

## 2010-12-29 ENCOUNTER — Other Ambulatory Visit (INDEPENDENT_AMBULATORY_CARE_PROVIDER_SITE_OTHER): Payer: Medicare Other | Admitting: Internal Medicine

## 2010-12-29 DIAGNOSIS — Z0000 Encounter for general adult medical examination without abnormal findings: Secondary | ICD-10-CM

## 2010-12-29 DIAGNOSIS — I10 Essential (primary) hypertension: Secondary | ICD-10-CM

## 2010-12-29 DIAGNOSIS — Z125 Encounter for screening for malignant neoplasm of prostate: Secondary | ICD-10-CM

## 2010-12-29 LAB — BASIC METABOLIC PANEL
BUN: 17 mg/dL (ref 6–23)
CO2: 26 mEq/L (ref 19–32)
Calcium: 8.5 mg/dL (ref 8.4–10.5)
Chloride: 101 mEq/L (ref 96–112)
Creatinine, Ser: 0.7 mg/dL (ref 0.4–1.5)
Glucose, Bld: 99 mg/dL (ref 70–99)

## 2010-12-29 LAB — LIPID PANEL
HDL: 55.3 mg/dL (ref >39.00)
Total CHOL/HDL Ratio: 2
Triglycerides: 33 mg/dL (ref 0.0–149.0)
VLDL: 6.6 mg/dL (ref 0.0–40.0)

## 2010-12-29 LAB — HEPATIC FUNCTION PANEL
Albumin: 3.4 g/dL — ABNORMAL LOW (ref 3.5–5.2)
Bilirubin, Direct: 0.1 mg/dL (ref 0.0–0.3)
Total Protein: 6.3 g/dL (ref 6.0–8.3)

## 2010-12-29 LAB — POCT URINALYSIS DIPSTICK
Bilirubin, UA: NEGATIVE
Blood, UA: NEGATIVE
Ketones, UA: NEGATIVE
Protein, UA: NEGATIVE
pH, UA: 5

## 2010-12-29 LAB — CBC WITH DIFFERENTIAL/PLATELET
Basophils Absolute: 0 10*3/uL (ref 0.0–0.1)
Basophils Relative: 0.8 % (ref 0.0–3.0)
Eosinophils Absolute: 0.1 10*3/uL (ref 0.0–0.7)
Lymphocytes Relative: 33.7 % (ref 12.0–46.0)
MCHC: 34.2 g/dL (ref 30.0–36.0)
MCV: 92.7 fl (ref 78.0–100.0)
Monocytes Absolute: 0.5 10*3/uL (ref 0.1–1.0)
Neutrophils Relative %: 51.9 % (ref 43.0–77.0)
RBC: 4.43 Mil/uL (ref 4.22–5.81)
RDW: 14.3 % (ref 11.5–14.6)

## 2011-01-05 ENCOUNTER — Encounter: Payer: Self-pay | Admitting: Internal Medicine

## 2011-01-05 ENCOUNTER — Ambulatory Visit (INDEPENDENT_AMBULATORY_CARE_PROVIDER_SITE_OTHER): Payer: PRIVATE HEALTH INSURANCE | Admitting: Internal Medicine

## 2011-01-05 ENCOUNTER — Other Ambulatory Visit (INDEPENDENT_AMBULATORY_CARE_PROVIDER_SITE_OTHER): Payer: PRIVATE HEALTH INSURANCE | Admitting: *Deleted

## 2011-01-05 ENCOUNTER — Other Ambulatory Visit: Payer: Self-pay | Admitting: *Deleted

## 2011-01-05 DIAGNOSIS — Z2911 Encounter for prophylactic immunotherapy for respiratory syncytial virus (RSV): Secondary | ICD-10-CM

## 2011-01-05 DIAGNOSIS — Z0000 Encounter for general adult medical examination without abnormal findings: Secondary | ICD-10-CM

## 2011-01-05 DIAGNOSIS — H3530 Unspecified macular degeneration: Secondary | ICD-10-CM | POA: Insufficient documentation

## 2011-01-05 DIAGNOSIS — M25552 Pain in left hip: Secondary | ICD-10-CM

## 2011-01-05 DIAGNOSIS — M25559 Pain in unspecified hip: Secondary | ICD-10-CM

## 2011-01-05 MED ORDER — BENAZEPRIL HCL 20 MG PO TABS: 20.0000 mg | ORAL_TABLET | Freq: Every day | ORAL | Status: DC

## 2011-01-05 MED ORDER — LEVOTHYROXINE SODIUM 150 MCG PO TABS: 150.0000 ug | ORAL_TABLET | Freq: Every day | ORAL | Status: DC

## 2011-01-05 MED ORDER — ALLOPURINOL 300 MG PO TABS: 300.0000 mg | ORAL_TABLET | Freq: Every day | ORAL | Status: DC

## 2011-01-05 NOTE — Progress Notes (Signed)
  Subjective:    Patient ID: Jay Webster, male    DOB: 1932/03/19, 77 y.o.   MRN: [id number]  HPI Shingles vaccine discusssion, Advanced macular degeneration on multiple therapies including  avastin and laser He has notes weight gain Hip pain left hip...injection helped for one month The pain is worse with walking up hill He limps after overuse and has not been exercizing.    Review of Systems  Constitutional: Negative for fever and fatigue.  HENT: Negative for hearing loss, congestion, neck pain and postnasal drip.   Eyes: Positive for visual disturbance. Negative for discharge.  Respiratory: Negative for cough, shortness of breath and wheezing.   Cardiovascular: Negative for leg swelling.  Gastrointestinal: Negative for abdominal pain, constipation and abdominal distention.  Genitourinary: Negative for urgency and frequency.  Musculoskeletal: Positive for joint swelling, arthralgias and gait problem.  Skin: Negative for color change and rash.  Neurological: Negative for weakness and light-headedness.  Hematological: Negative for adenopathy.  Psychiatric/Behavioral: Negative for behavioral problems.   Past Medical History  Diagnosis Date  . Hypertension   . Gout   . Low back pain   . Arthritis   . Thyroid disease   . RBBB (right bundle branch block with left anterior fascicular block)    Past Surgical History  Procedure Date  . Colonoscopy   . Appendectomy   . Rotator cuff repair     reports that he has quit smoking. He does not have any smokeless tobacco history on file. He reports that he does not drink alcohol or use illicit drugs. family history includes Anemia in an unspecified family member; Diabetes in an unspecified family member; Heart disease in an unspecified family member; and Thyroid disease in an unspecified family member. Allergies  Allergen Reactions  . Penicillins         Objective:   Physical Exam  Constitutional: He is oriented to person,  place, and time. He appears well-developed and well-nourished.  HENT:  Head: Normocephalic and atraumatic.  Eyes: Conjunctivae are normal. Pupils are equal, round, and reactive to light.  Neck: Normal range of motion. Neck supple.  Cardiovascular: Normal rate and regular rhythm.   Pulmonary/Chest: Effort normal and breath sounds normal.  Abdominal: Soft. Bowel sounds are normal.  Musculoskeletal: Normal range of motion.  Neurological: He is alert and oriented to person, place, and time.  Skin: Skin is warm and dry.  Psychiatric: He has a normal mood and affect. His behavior is normal.          Assessment & Plan:  Referral made for macular degeneration T. think [facility] ophthalmology clinic for further evaluation of treatment options and possible experimental protocols.  Refer to previous per orthopedic for left hip pain consideration for possible arthroscopic surgery for spurs versus hip resurfacing per consultant  prescription for shingles vaccination  Patient presents for yearly preventative medicine examination.   all immunizations and health maintenance protocols were reviewed with the patient and they are up to date with these protocols.   screening laboratory values were reviewed with the patient including screening of hyperlipidemia PSA renal function and hepatic function.   There medications past medical history social history problem list and allergies were reviewed in detail.   Goals were established with regard to weight loss exercise diet in compliance with medications

## 2011-01-20 NOTE — Op Note (Signed)
NAMESIGISMUND, CROSS               ACCOUNT NO.:  [id number]   MEDICAL RECORD NO.:  [id number]          PATIENT TYPE:  AMB   LOCATION:  DAY                          FACILITY:  [facility]   PHYSICIAN:  Velora Heckler, MD      DATE OF BIRTH:  [birth year]   DATE OF PROCEDURE:  10/16/2008  DATE OF DISCHARGE:                               OPERATIVE REPORT   PREOPERATIVE DIAGNOSES:  Multinodular thyroid goiter with compressive  symptoms.   POSTOPERATIVE DIAGNOSES:  Multinodular thyroid goiter with compressive  symptoms.   PROCEDURE:  Total thyroidectomy.   SURGEON:  Velora Heckler, MD, FACS   ASSISTANT:  Ardeth Sportsman, MD   ANESTHESIA:  General per Hezzie Bump. Okey Dupre, M.D.   ESTIMATED BLOOD LOSS:  Minimal.   PREPARATION:  Betadine.   COMPLICATIONS:  None.   INDICATIONS:  The patient is a 77 year old white male from [place],  [place].  He has been followed by Stacie Glaze, MD with a  known multinodular goiter.  He has been on thyroid hormone suppression.  Ultrasound examination showed an enlarging thyroid gland with dominant  nodules bilaterally.  There were microcalcifications present.  The  patient was referred for thyroidectomy for management of multinodular  thyroid goiter with compressive symptoms.   BODY OF REPORT:  The procedure was done in OR #6 at the [facility].  The patient was brought to the operating room,  placed in a supine position on the operating room table.  Following  administration of general anesthesia the patient was positioned and then  prepped and draped in the usual strict aseptic fashion.  After  ascertaining that an adequate level of anesthesia had been obtained, a  Kocher incision was made with a #15 blade.  Dissection was carried  through subcutaneous tissues.  The platysma was divided with the  electrocautery.  Subplatysmal flaps were elevated cephalad and caudad  from the thyroid notch to the sternal notch.  The Mahorner  self-  retaining retractor was placed for exposure.  The strap muscles were  incised in the midline and dissection was begun on the left thyroid  lobe.  The strap muscles were reflected laterally.  Left lobe was  exposed.  Using gentle blunt dissection the entire gland was mobilized.  Venous tributaries are divided between medium Ligaclips with the  Harmonic scalpel.  The superior pole vessels were dissected out, ligated  in continuity with 2-0 silk ties, and divided with the Harmonic scalpel.  The middle thyroid vein was divided between Ligaclips with the Harmonic  scalpel.  The inferior venous tributaries are ligated in continuity with  2-0 silk ties and divided with the Harmonic scalpel.  The gland was  rolled anteriorly.  The parathyroid tissue was identified and preserved.  Branches of the inferior thyroid artery are divided between small  Ligaclips with the Harmonic scalpel.  The gland was rolled further  anteriorly.  The recurrent nerve was identified and preserved.  Ligament  of Allyson Sabal was transected with electrocautery and the gland was mobilized  up and onto the anterior trachea.  The isthmus was mobilized across the  midline.  The inferior venous tributaries are ligated with 2-0 silk ties  and divided with the Harmonic scalpel.   Next, we turned our attention to the right thyroid lobe.  The right lobe  contains a dominant nodule in the superior portion of the right lobe.  This was gently mobilized with blunt dissection.  The superior pole  vessels are ligated in continuity with 2-0 silk ties and divided with  the Harmonic scalpel.  The gland was rolled anteriorly.  Parathyroid  tissue was again identified and preserved.  Branches of the inferior  thyroid artery are divided between small and medium Ligaclips with the  Harmonic scalpel.  Recurrent nerve was identified and preserved.  Inferior venous tributaries were ligated in continuity with 2-0 silk  ties and divided with the  Harmonic scalpel.  The gland was rolled  further anteriorly and the ligament of Allyson Sabal was transected with  electrocautery.  The isthmus was mobilized onto the anterior trachea.  There was a moderate size pyramidal lobe which was dissected off of the  anterior thyroid cartilage with electrocautery.  The entire gland was  then excised.  A suture was used to mark the left superior pole.  The  specimen was submitted to pathology for review.  The neck was irrigated  bilaterally with warm saline which was evacuated.  Surgicel was placed  in the operative field bilaterally.  The strap muscles were  reapproximated in the midline with interrupted 3-0 Vicryl sutures.  The  platysma was closed with interrupted 3-0 Vicryl sutures.  The skin was  closed with a running 4-0 Monocryl subcuticular suture.  The wound was  washed and dried.  Benzoin and Steri-Strips are applied.  Sterile  dressings were applied.  The patient was awakened from anesthesia and  brought to the recovery room in stable condition.  The patient tolerated  the procedure well.      Velora Heckler, MD  Electronically Signed     TMG/MEDQ  D:  10/16/2008  T:  10/16/2008  Job:  [id number]   cc:   Stacie Glaze, MD  [address] [place]  [place] [address]

## 2011-04-06 ENCOUNTER — Encounter: Payer: Self-pay | Admitting: Internal Medicine

## 2011-04-06 ENCOUNTER — Ambulatory Visit (INDEPENDENT_AMBULATORY_CARE_PROVIDER_SITE_OTHER): Payer: PRIVATE HEALTH INSURANCE | Admitting: Internal Medicine

## 2011-04-06 VITALS — BP 130/80 | HR 72 | Temp 98.2°F | Resp 16 | Ht 67.0 in | Wt 198.0 lb

## 2011-04-06 DIAGNOSIS — M1990 Unspecified osteoarthritis, unspecified site: Secondary | ICD-10-CM

## 2011-04-06 DIAGNOSIS — M109 Gout, unspecified: Secondary | ICD-10-CM

## 2011-04-06 DIAGNOSIS — E890 Postprocedural hypothyroidism: Secondary | ICD-10-CM

## 2011-04-06 DIAGNOSIS — R221 Localized swelling, mass and lump, neck: Secondary | ICD-10-CM

## 2011-04-06 DIAGNOSIS — M545 Low back pain, unspecified: Secondary | ICD-10-CM

## 2011-04-06 DIAGNOSIS — R220 Localized swelling, mass and lump, head: Secondary | ICD-10-CM

## 2011-04-06 DIAGNOSIS — I10 Essential (primary) hypertension: Secondary | ICD-10-CM

## 2011-04-06 LAB — BASIC METABOLIC PANEL
Calcium: 8.9 mg/dL (ref 8.4–10.5)
GFR: 94.22 mL/min (ref >60.00)
Sodium: 139 mEq/L (ref 135–145)

## 2011-04-06 LAB — TSH: TSH: 2.84 u[IU]/mL (ref 0.35–5.50)

## 2011-04-06 LAB — T4, FREE: Free T4: 1.29 ng/dL (ref 0.60–1.60)

## 2011-04-06 LAB — URIC ACID: Uric Acid, Serum: 4 mg/dL (ref 4.0–7.8)

## 2011-04-06 NOTE — Progress Notes (Signed)
  Subjective:    Patient ID: Jay Webster, male    DOB: 05/01/1932, 77 y.o.   MRN: [id number]  HPI On new drug therapy for macular degeration Had been receiving laser for eyes The hip injection from [facility] has worked The pts thyroid  Is due for monitoring of levels Has appointment with surgeon for recheck Has recurrent mass in neck    Review of Systems  Constitutional: Negative for fever and fatigue.  HENT: Negative for hearing loss, congestion, neck pain and postnasal drip.   Eyes: Negative for discharge, redness and visual disturbance.  Respiratory: Negative for cough, shortness of breath and wheezing.   Cardiovascular: Negative for leg swelling.  Gastrointestinal: Negative for abdominal pain, constipation and abdominal distention.  Genitourinary: Negative for urgency and frequency.  Musculoskeletal: Negative for joint swelling and arthralgias.  Skin: Negative for color change and rash.  Neurological: Negative for weakness and light-headedness.  Hematological: Negative for adenopathy.  Psychiatric/Behavioral: Negative for behavioral problems.   Past Medical History  Diagnosis Date  . Hypertension   . Gout   . Low back pain   . Arthritis   . Thyroid disease   . RBBB (right bundle branch block with left anterior fascicular block)    Past Surgical History  Procedure Date  . Colonoscopy   . Appendectomy   . Rotator cuff repair     reports that he has quit smoking. He does not have any smokeless tobacco history on file. He reports that he does not drink alcohol or use illicit drugs. family history includes Anemia in an unspecified family member; Diabetes in his mother; Heart disease in his mother; and Thyroid disease in an unspecified family member. Allergies  Allergen Reactions  . Penicillins        Objective:   Physical Exam  Nursing note and vitals reviewed. Constitutional: He appears well-developed and well-nourished.  HENT:  Head: Normocephalic and atraumatic.    Eyes: Conjunctivae are normal. Pupils are equal, round, and reactive to light.  Neck: Normal range of motion. Neck supple.  Cardiovascular: Normal rate and regular rhythm.   Pulmonary/Chest: Effort normal and breath sounds normal.  Abdominal: Soft. Bowel sounds are normal.   Examination soft tissues of the neck show a midline scar across the base of the neck, left side there is a palpable mass approximately 3 x 3 cm       Assessment & Plan:   New neck mass with a history of thyroid cancer Will obtain a CT scan of the soft tissues prior to his surgery visit on August 15.  We'll monitor T3-T4 and TSH today.  We'll get a basic metabolic panel prior to the dye exposure but his blood pressure is stable on his current medications.  We'll check her uric acid due to the gout history

## 2011-04-14 ENCOUNTER — Ambulatory Visit (INDEPENDENT_AMBULATORY_CARE_PROVIDER_SITE_OTHER)
Admission: RE | Admit: 2011-04-14 | Discharge: 2011-04-14 | Disposition: A | Discharge status: Home / Self Care | Payer: Medicare Other | Source: Ambulatory Visit | Attending: Internal Medicine | Admitting: Internal Medicine

## 2011-04-14 DIAGNOSIS — R220 Localized swelling, mass and lump, head: Secondary | ICD-10-CM

## 2011-04-14 DIAGNOSIS — R221 Localized swelling, mass and lump, neck: Secondary | ICD-10-CM

## 2011-04-14 MED ORDER — IOHEXOL 300 MG/ML  SOLN
75.0000 mL | Freq: Once | INTRAMUSCULAR | Status: AC | PRN
  Administered 2011-04-14: 75 mL via INTRAVENOUS

## 2011-04-21 ENCOUNTER — Ambulatory Visit (INDEPENDENT_AMBULATORY_CARE_PROVIDER_SITE_OTHER): Payer: Medicare Other | Admitting: Surgery

## 2011-04-21 ENCOUNTER — Encounter (INDEPENDENT_AMBULATORY_CARE_PROVIDER_SITE_OTHER): Payer: Self-pay | Admitting: Surgery

## 2011-04-21 VITALS — BP 138/92 | HR 80 | Temp 97.4°F | Ht 67.0 in | Wt 198.0 lb

## 2011-04-21 DIAGNOSIS — C73 Malignant neoplasm of thyroid gland: Secondary | ICD-10-CM

## 2011-04-21 DIAGNOSIS — Z85850 Personal history of malignant neoplasm of thyroid: Secondary | ICD-10-CM | POA: Insufficient documentation

## 2011-04-21 NOTE — Progress Notes (Signed)
Visit Diagnoses: 1. Thyroid cancer     HISTORY: Patient is a 77 year old male who underwent total thyroidectomy in February of 2010. 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 Patient was subsequently treated with radioactive iodine. Over the past 2-1/2 years he has been disease-free. He presents today for his final surgical followup.   PERTINENT REVIEW OF SYSTEMS: Patient does note a soft tissue mass in the anterior left neck. This is a long-standing lipoma which has had minimal change. Patient denies any problems with voice. He denies dysphagia. He denies tremors. He denies palpitations.   EXAM: HEENT: normocephalic; pupils equal and reactive; sclerae clear; dentition good; mucous membranes moist NECK:  Palpation reveals no nodularity in the thyroid bed. Surgical wound is well healed; symmetric on extension; no palpable anterior or posterior cervical lymphadenopathy; no supraclavicular masses; no tenderness CHEST: clear to auscultation bilaterally without rales, rhonchi, or wheezes CARDIAC: regular rate and rhythm without significant murmur; peripheral pulses are full EXT:  non-tender without edema; no deformity NEURO: no gross focal deficits; no sign of tremor   IMPRESSION: Follicular variant of papillary thyroid carcinoma, T1b, NX, no evidence of recurrent disease   PLAN: Patient has no evidence of recurrent disease at this point in time. He continued close followup with his primary physician. Patient should have a TSH level done at regular intervals. He should have a thyroglobulin determination done at least yearly. He will followup with Dr. Romero Belling in endocrinology as needed. He will return to see me in this office as needed.   Velora Heckler, MD, FACS General & Endocrine Surgery [facility]

## 2011-06-09 DIAGNOSIS — H3530 Unspecified macular degeneration: Secondary | ICD-10-CM | POA: Insufficient documentation

## 2011-08-07 ENCOUNTER — Ambulatory Visit (INDEPENDENT_AMBULATORY_CARE_PROVIDER_SITE_OTHER): Payer: Medicare Other | Admitting: Internal Medicine

## 2011-08-07 ENCOUNTER — Encounter: Payer: Self-pay | Admitting: Internal Medicine

## 2011-08-07 VITALS — BP 110/78 | HR 72 | Temp 98.3°F | Resp 16 | Ht 66.0 in | Wt 198.0 lb

## 2011-08-07 DIAGNOSIS — E039 Hypothyroidism, unspecified: Secondary | ICD-10-CM

## 2011-08-07 DIAGNOSIS — N209 Urinary calculus, unspecified: Secondary | ICD-10-CM

## 2011-08-07 DIAGNOSIS — I10 Essential (primary) hypertension: Secondary | ICD-10-CM

## 2011-08-07 DIAGNOSIS — M109 Gout, unspecified: Secondary | ICD-10-CM

## 2011-08-07 LAB — T4, FREE: Free T4: 1.43 ng/dL (ref 0.60–1.60)

## 2011-08-07 LAB — BASIC METABOLIC PANEL
CO2: 26 mEq/L (ref 19–32)
Chloride: 105 mEq/L (ref 96–112)
Potassium: 4.4 mEq/L (ref 3.5–5.1)
Sodium: 138 mEq/L (ref 135–145)

## 2011-08-07 LAB — URIC ACID: Uric Acid, Serum: 4.6 mg/dL (ref 4.0–7.8)

## 2011-08-07 LAB — TSH: TSH: 1.18 u[IU]/mL (ref 0.35–5.50)

## 2011-08-07 LAB — T3, FREE: T3, Free: 2.8 pg/mL (ref 2.3–4.2)

## 2011-08-07 NOTE — Patient Instructions (Signed)
The patient is instructed to continue all medications as prescribed. Schedule followup with check out clerk upon leaving the clinic  

## 2011-08-07 NOTE — Progress Notes (Signed)
Subjective:    Patient ID: Jay Webster, male    DOB: Dec 14, 1931, 77 y.o.   MRN: [id number]  HPI Follow up for Hypertension, hypothyroidism and hyperlipidemia No chest pain, SOB or edema No current gout but needs measurement of uric acid due to stone hx Blood pressure medications stable      Review of Systems  Constitutional: Negative for fever and fatigue.  HENT: Negative for hearing loss, congestion, neck pain and postnasal drip.   Eyes: Negative for discharge, redness and visual disturbance.  Respiratory: Negative for cough, shortness of breath and wheezing.   Cardiovascular: Negative for leg swelling.  Gastrointestinal: Negative for abdominal pain, constipation and abdominal distention.  Genitourinary: Negative for urgency and frequency.  Musculoskeletal: Negative for joint swelling and arthralgias.  Skin: Negative for color change and rash.  Neurological: Negative for weakness and light-headedness.  Hematological: Negative for adenopathy.  Psychiatric/Behavioral: Negative for behavioral problems.   Past Medical History  Diagnosis Date  . Hypertension   . Gout     Patient stated he does not have gout. He has a problem with kidney stones  . Low back pain   . Arthritis   . Thyroid disease   . RBBB (right bundle branch block with left anterior fascicular block)   . Chronic kidney disease     Patient had stones. Does not and never had gout.    History   Social History  . Marital Status: Significant Other    Spouse Name: N/A    Number of Children: N/A  . Years of Education: N/A   Occupational History  . retired    Social History Main Topics  . Smoking status: Former Games developer  . Smokeless tobacco: Not on file  . Alcohol Use: No  . Drug Use: No  . Sexually Active: Yes   Other Topics Concern  . Not on file   Social History Narrative  . No narrative on file    Past Surgical History  Procedure Date  . Colonoscopy   . Appendectomy   . Rotator cuff repair      Family History  Problem Relation Age of Onset  . Anemia    . Thyroid disease    . Heart disease Mother   . Diabetes Mother     Allergies  Allergen Reactions  . Penicillins Swelling and Palpitations    Swelling of joints.    Current Outpatient Prescriptions on File Prior to Visit  Medication Sig Dispense Refill  . allopurinol (ZYLOPRIM) 300 MG tablet Take 1 tablet (300 mg total) by mouth daily.  90 tablet  3  . aspirin 81 MG tablet Take 81 mg by mouth as needed. Taken every other night      . benazepril (LOTENSIN) 20 MG tablet Take 1 tablet (20 mg total) by mouth daily.  90 tablet  3  . fish oil-omega-3 fatty acids 1000 MG capsule Take 2 g by mouth daily.        Marland Kitchen levothyroxine (SYNTHROID, LEVOTHROID) 150 MCG tablet Take 1 tablet (150 mcg total) by mouth daily.  90 tablet  3  . NON FORMULARY eylia- administered by opth         BP 110/78  Pulse 72  Temp 98.3 F (36.8 C)  Resp 16  Ht 5\' 6"  (1.676 m)  Wt 198 lb (89.812 kg)  BMI 31.96 kg/m2       Objective:   Physical Exam  Nursing note and vitals reviewed. Constitutional: He appears well-developed and well-nourished.  HENT:  Head: Normocephalic and atraumatic.  Eyes: Conjunctivae are normal. Pupils are equal, round, and reactive to light.  Neck: Normal range of motion. Neck supple.  Cardiovascular: Normal rate and regular rhythm.   Pulmonary/Chest: Effort normal and breath sounds normal.  Abdominal: Soft. Bowel sounds are normal.  l lipoma on left neck corresponds to CT finding last year of lipoma.  Status post thyroidectomy for thyroid cancer       Assessment & Plan:  Patient status post thyroid cancer on Synthroid replacement we'll monitor TSH T3 free T4 today patient with a history of uric acid renal stones on IVP or an ALT will monitor uric acid level today patient with controlled hypertension on Lotensin we'll monitor basic metabolic panel today

## 2011-09-11 DIAGNOSIS — H35723 Serous detachment of retinal pigment epithelium, bilateral: Secondary | ICD-10-CM | POA: Insufficient documentation

## 2011-09-11 DIAGNOSIS — H35059 Retinal neovascularization, unspecified, unspecified eye: Secondary | ICD-10-CM | POA: Insufficient documentation

## 2011-11-10 DIAGNOSIS — H35359 Cystoid macular degeneration, unspecified eye: Secondary | ICD-10-CM | POA: Insufficient documentation

## 2012-01-01 ENCOUNTER — Other Ambulatory Visit (INDEPENDENT_AMBULATORY_CARE_PROVIDER_SITE_OTHER): Payer: Medicare Other

## 2012-01-01 DIAGNOSIS — C73 Malignant neoplasm of thyroid gland: Secondary | ICD-10-CM

## 2012-01-01 DIAGNOSIS — Z0000 Encounter for general adult medical examination without abnormal findings: Secondary | ICD-10-CM

## 2012-01-01 DIAGNOSIS — I10 Essential (primary) hypertension: Secondary | ICD-10-CM

## 2012-01-01 DIAGNOSIS — Z125 Encounter for screening for malignant neoplasm of prostate: Secondary | ICD-10-CM

## 2012-01-01 LAB — HEPATIC FUNCTION PANEL
ALT: 18 U/L (ref 0–53)
AST: 18 U/L (ref 0–37)
Albumin: 3.5 g/dL (ref 3.5–5.2)
Total Bilirubin: 0.4 mg/dL (ref 0.3–1.2)

## 2012-01-01 LAB — BASIC METABOLIC PANEL
BUN: 16 mg/dL (ref 6–23)
CO2: 24 mEq/L (ref 19–32)
Chloride: 104 mEq/L (ref 96–112)
Glucose, Bld: 94 mg/dL (ref 70–99)
Potassium: 4.5 mEq/L (ref 3.5–5.1)
Sodium: 136 mEq/L (ref 135–145)

## 2012-01-01 LAB — POCT URINALYSIS DIPSTICK
Bilirubin, UA: NEGATIVE
Ketones, UA: NEGATIVE
Leukocytes, UA: NEGATIVE
Spec Grav, UA: 1.015

## 2012-01-01 LAB — PSA: PSA: 1.02 ng/mL (ref 0.10–4.00)

## 2012-01-01 LAB — CBC WITH DIFFERENTIAL/PLATELET
Basophils Absolute: 0 10*3/uL (ref 0.0–0.1)
HCT: 41.6 % (ref 39.0–52.0)
Hemoglobin: 14 g/dL (ref 13.0–17.0)
Lymphs Abs: 1.5 10*3/uL (ref 0.7–4.0)
MCV: 91.2 fl (ref 78.0–100.0)
Monocytes Absolute: 0.6 10*3/uL (ref 0.1–1.0)
Monocytes Relative: 11.4 % (ref 3.0–12.0)
Neutro Abs: 2.7 10*3/uL (ref 1.4–7.7)
Platelets: 233 10*3/uL (ref 150.0–400.0)
RDW: 14.3 % (ref 11.5–14.6)

## 2012-01-01 LAB — LIPID PANEL
Cholesterol: 131 mg/dL (ref 0–200)
HDL: 56 mg/dL (ref >39.00)
VLDL: 7 mg/dL (ref 0.0–40.0)

## 2012-01-01 LAB — TSH: TSH: 0.7 u[IU]/mL (ref 0.35–5.50)

## 2012-01-08 ENCOUNTER — Encounter: Payer: Self-pay | Admitting: Internal Medicine

## 2012-01-08 ENCOUNTER — Encounter: Payer: PRIVATE HEALTH INSURANCE | Admitting: Internal Medicine

## 2012-01-08 ENCOUNTER — Ambulatory Visit (INDEPENDENT_AMBULATORY_CARE_PROVIDER_SITE_OTHER): Payer: Medicare Other | Admitting: Internal Medicine

## 2012-01-08 VITALS — BP 124/80 | HR 72 | Temp 98.2°F | Resp 16 | Ht 66.0 in | Wt 195.0 lb

## 2012-01-08 DIAGNOSIS — Z0000 Encounter for general adult medical examination without abnormal findings: Secondary | ICD-10-CM

## 2012-01-08 DIAGNOSIS — M25559 Pain in unspecified hip: Secondary | ICD-10-CM

## 2012-01-08 DIAGNOSIS — E669 Obesity, unspecified: Secondary | ICD-10-CM

## 2012-01-08 DIAGNOSIS — M25552 Pain in left hip: Secondary | ICD-10-CM

## 2012-01-08 DIAGNOSIS — I10 Essential (primary) hypertension: Secondary | ICD-10-CM

## 2012-01-08 MED ORDER — ALLOPURINOL 300 MG PO TABS: 300.0000 mg | ORAL_TABLET | Freq: Every day | ORAL | Status: DC

## 2012-01-08 MED ORDER — LEVOTHYROXINE SODIUM 150 MCG PO TABS: 150.0000 ug | ORAL_TABLET | Freq: Every day | ORAL | Status: DC

## 2012-01-08 MED ORDER — BENAZEPRIL HCL 20 MG PO TABS: 20.0000 mg | ORAL_TABLET | Freq: Every day | ORAL | Status: DC

## 2012-01-08 NOTE — Progress Notes (Signed)
Subjective:    Patient ID: Jay Webster, male    DOB: 20-Dec-1931, 78 y.o.   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 History of gout on allopurinol a history of hypothyroidism on Synthroid mild hyperlipidemia on visual only.  He has a primary complaint of progressive arthritis of his lower extremities knees and hips to prevent him from exercising he also has an issue with obesity and this is secondary to overeating and lack of exercise    Review of Systems  Constitutional: Negative for fever and fatigue.  HENT: Negative for hearing loss, congestion, neck pain and postnasal drip.   Eyes: Negative for discharge, redness and visual disturbance.  Respiratory: Negative for cough, shortness of breath and wheezing.   Cardiovascular: Negative for leg swelling.  Gastrointestinal: Negative for abdominal pain, constipation and abdominal distention.  Genitourinary: Negative for urgency and frequency.  Musculoskeletal: Negative for joint swelling and arthralgias.  Skin: Negative for color change and rash.  Neurological: Negative for weakness and light-headedness.  Hematological: Negative for adenopathy.  Psychiatric/Behavioral: Negative for behavioral problems.   Past Medical History  Diagnosis Date  . Hypertension   . Gout     Patient stated he does not have gout. He has a problem with kidney stones  . Low back pain   . Arthritis   . Thyroid disease   . RBBB (right bundle branch block with left anterior fascicular block)   . Chronic kidney disease     Patient had stones. Does not and never had gout.    History   Social History  . Marital Status: Significant Other    Spouse Name: N/A    Number of Children: N/A  . Years of Education: N/A   Occupational History  . retired    Social History Main Topics  . Smoking status: Former Games developer  . Smokeless tobacco: Not on file    . Alcohol Use: No  . Drug Use: No  . Sexually Active: Yes   Other Topics Concern  . Not on file   Social History Narrative  . No narrative on file    Past Surgical History  Procedure Date  . Colonoscopy   . Appendectomy   . Rotator cuff repair     Family History  Problem Relation Age of Onset  . Anemia    . Thyroid disease    . Heart disease Mother   . Diabetes Mother     Allergies  Allergen Reactions  . Penicillins Swelling and Palpitations    Swelling of joints.    Current Outpatient Prescriptions on File Prior to Visit  Medication Sig Dispense Refill  . aspirin 81 MG tablet Take 81 mg by mouth as needed. Taken every other night      . fish oil-omega-3 fatty acids 1000 MG capsule Take 2 g by mouth daily.        . NON FORMULARY eylia- administered by opth       . DISCONTD: allopurinol (ZYLOPRIM) 300 MG tablet Take 1 tablet (300 mg total) by mouth daily.  90 tablet  3  . DISCONTD: benazepril (LOTENSIN) 20 MG tablet Take 1 tablet (20 mg total) by mouth daily.  90 tablet  3  . DISCONTD: levothyroxine (SYNTHROID, LEVOTHROID) 150 MCG tablet Take 1 tablet (150 mcg total) by mouth daily.  90 tablet  3    BP 124/80  Pulse 72  Temp 98.2 F (  36.8 C)  Resp 16  Ht 5\' 6"  (1.676 m)  Wt 195 lb (88.451 kg)  BMI 31.47 kg/m2       Objective:   Physical Exam  Nursing note and vitals reviewed. Constitutional: He appears well-developed and well-nourished.  HENT:  Head: Normocephalic and atraumatic.  Eyes: Conjunctivae are normal.  Neck: Normal range of motion. Neck supple.  Cardiovascular: Normal rate and regular rhythm.   Pulmonary/Chest: Effort normal and breath sounds normal.  Abdominal: Soft. Bowel sounds are normal.          Assessment & Plan:   Patient presents for yearly preventative medicine examination.   all immunizations and health maintenance protocols were reviewed with the patient and they are up to date with these protocols.   screening  laboratory values were reviewed with the patient including screening of hyperlipidemia PSA renal function and hepatic function.   There medications past medical history social history problem list and allergies were reviewed in detail.   Goals were established with regard to weight loss exercise diet in compliance with medications  Plan was put in place for exercise including water aerobics and dietary changes to help him with his obesity we'll monitor that in 3 months time.  Thyroid is stable blood pressure is stable gout is stable. The

## 2012-01-08 NOTE — Patient Instructions (Addendum)
The patient is instructed to continue all medications as prescribed. Schedule followup with check out clerk upon leaving the clinic 3 things are going to suggest for helping you with weight loss He is exercising he can tolerate such as pool activities water aerobics swimming,  recumbent bike.   The diet I want to look up online "paleo" diet

## 2012-04-12 DIAGNOSIS — H43819 Vitreous degeneration, unspecified eye: Secondary | ICD-10-CM | POA: Insufficient documentation

## 2012-04-14 ENCOUNTER — Ambulatory Visit (INDEPENDENT_AMBULATORY_CARE_PROVIDER_SITE_OTHER): Payer: Medicare Other | Admitting: Internal Medicine

## 2012-04-14 ENCOUNTER — Encounter: Payer: Self-pay | Admitting: Internal Medicine

## 2012-04-14 VITALS — BP 120/74 | HR 72 | Temp 98.1°F | Resp 14 | Wt 190.0 lb

## 2012-04-14 DIAGNOSIS — E038 Other specified hypothyroidism: Secondary | ICD-10-CM

## 2012-04-14 DIAGNOSIS — I10 Essential (primary) hypertension: Secondary | ICD-10-CM

## 2012-04-14 DIAGNOSIS — H3530 Unspecified macular degeneration: Secondary | ICD-10-CM

## 2012-04-14 NOTE — Progress Notes (Signed)
Subjective:    Patient ID: Jay Webster, male    DOB: 1932/01/25, 78 y.o.   MRN: [id number]  HPI The patient has been on avastin for progressive loss of vision but has not had good results He is follow by an opthamalogist I believe he has situational depression monitoring of hypothyroidism HTN stable    Review of Systems  Constitutional: Negative for fever and fatigue.  HENT: Negative for hearing loss, congestion, neck pain and postnasal drip.   Eyes: Positive for pain and visual disturbance. Negative for discharge and redness.  Respiratory: Negative for cough, shortness of breath and wheezing.   Cardiovascular: Negative for leg swelling.  Gastrointestinal: Negative for abdominal pain, constipation and abdominal distention.  Genitourinary: Negative for urgency and frequency.  Musculoskeletal: Negative for joint swelling and arthralgias.  Skin: Negative for color change and rash.  Neurological: Negative for weakness and light-headedness.  Hematological: Negative for adenopathy.  Psychiatric/Behavioral: Positive for behavioral problems.   Past Medical History  Diagnosis Date  . Hypertension   . Gout     Patient stated he does not have gout. He has a problem with kidney stones  . Low back pain   . Arthritis   . Thyroid disease   . RBBB (right bundle branch block with left anterior fascicular block)   . Chronic kidney disease     Patient had stones. Does not and never had gout.    History   Social History  . Marital Status: Significant Other    Spouse Name: N/A    Number of Children: N/A  . Years of Education: N/A   Occupational History  . retired    Social History Main Topics  . Smoking status: Former Games developer  . Smokeless tobacco: Not on file  . Alcohol Use: No  . Drug Use: No  . Sexually Active: Yes   Other Topics Concern  . Not on file   Social History Narrative  . No narrative on file    Past Surgical History  Procedure Date  . Colonoscopy   .  Appendectomy   . Rotator cuff repair     Family History  Problem Relation Age of Onset  . Anemia    . Thyroid disease    . Heart disease Mother   . Diabetes Mother     Allergies  Allergen Reactions  . Penicillins Swelling and Palpitations    Swelling of joints.    Current Outpatient Prescriptions on File Prior to Visit  Medication Sig Dispense Refill  . allopurinol (ZYLOPRIM) 300 MG tablet Take 1 tablet (300 mg total) by mouth daily.  90 tablet  3  . aspirin 81 MG tablet Take 81 mg by mouth as needed. Taken every other night      . benazepril (LOTENSIN) 20 MG tablet Take 1 tablet (20 mg total) by mouth daily.  90 tablet  3  . fish oil-omega-3 fatty acids 1000 MG capsule Take 2 g by mouth daily.        Marland Kitchen levothyroxine (SYNTHROID, LEVOTHROID) 150 MCG tablet Take 1 tablet (150 mcg total) by mouth daily.  90 tablet  3  . Multiple Vitamin (MULTIVITAMIN) capsule Take 1 capsule by mouth daily.      . Multiple Vitamins-Minerals (OCUVITE ADULT 50+) CAPS Take 1 capsule by mouth daily.      . NON FORMULARY eylia- administered by opth         BP 120/74  Pulse 72  Temp 98.1 F (36.7 C)  Resp 14  Wt 190 lb (86.183 kg)       Objective:   Physical Exam  Nursing note and vitals reviewed. Constitutional: He appears well-developed and well-nourished.  HENT:  Head: Normocephalic and atraumatic.  Eyes: Conjunctivae are normal. Pupils are equal, round, and reactive to light.  Neck: Normal range of motion. Neck supple.  Cardiovascular: Normal rate and regular rhythm.   Pulmonary/Chest: Effort normal and breath sounds normal.  Abdominal: Soft. Bowel sounds are normal.          Assessment & Plan:  Discussion of medications ofr depression Stable blood pressure Lab monitoring Colon due in 2014 Follow up of macular degeneration

## 2012-04-14 NOTE — Patient Instructions (Addendum)
The patient is instructed to continue all medications as prescribed. Schedule followup with check out clerk upon leaving the clinic  

## 2012-05-24 DIAGNOSIS — H43829 Vitreomacular adhesion, unspecified eye: Secondary | ICD-10-CM | POA: Insufficient documentation

## 2012-06-06 ENCOUNTER — Telehealth: Payer: Self-pay | Admitting: Internal Medicine

## 2012-06-06 MED ORDER — CITALOPRAM HYDROBROMIDE 40 MG PO TABS: 40.0000 mg | ORAL_TABLET | Freq: Every day | ORAL | Status: DC

## 2012-06-06 NOTE — Telephone Encounter (Signed)
He was calling to let doctor know how Cymbalta working   He said people around him have said they don't see any change in his attitude but he has noticed a big improvement in skeletal pain.  He was also wanting to know if there is something equivalent that is less expensive     If you call and no answer let ring 20-30 seconds because it will ring to his cell phone

## 2012-06-06 NOTE — Telephone Encounter (Signed)
Per dr Lovell Sheehan- can try celexa 40-pt informed and med sent in

## 2012-10-25 ENCOUNTER — Telehealth: Payer: Self-pay | Admitting: *Deleted

## 2012-10-25 DIAGNOSIS — Z79899 Other long term (current) drug therapy: Secondary | ICD-10-CM | POA: Insufficient documentation

## 2012-10-25 NOTE — Telephone Encounter (Signed)
Saw dr Hebert Soho from [facility] here in [place], this am .  He wants to start him on methortrexate 2.5 4 tabs q week and will need lab work prior-please advise- dr Liana Crocker office number in [place] is [phone]

## 2012-10-26 ENCOUNTER — Other Ambulatory Visit: Payer: Self-pay | Admitting: *Deleted

## 2012-10-27 ENCOUNTER — Other Ambulatory Visit (INDEPENDENT_AMBULATORY_CARE_PROVIDER_SITE_OTHER): Payer: Medicare Other

## 2012-10-27 DIAGNOSIS — Z79899 Other long term (current) drug therapy: Secondary | ICD-10-CM

## 2012-10-27 LAB — CBC WITH DIFFERENTIAL/PLATELET
Basophils Absolute: 0.1 10*3/uL (ref 0.0–0.1)
HCT: 38.1 % — ABNORMAL LOW (ref 39.0–52.0)
Lymphs Abs: 1.5 10*3/uL (ref 0.7–4.0)
MCV: 91.8 fl (ref 78.0–100.0)
Monocytes Absolute: 0.4 10*3/uL (ref 0.1–1.0)
Platelets: 219 10*3/uL (ref 150.0–400.0)
RDW: 13.6 % (ref 11.5–14.6)

## 2012-10-27 LAB — HEPATIC FUNCTION PANEL
ALT: 21 U/L (ref 0–53)
Albumin: 3.5 g/dL (ref 3.5–5.2)
Total Bilirubin: 0.4 mg/dL (ref 0.3–1.2)

## 2012-10-28 NOTE — Telephone Encounter (Signed)
Faxed labs to dr Bethel Born

## 2012-11-09 ENCOUNTER — Other Ambulatory Visit (INDEPENDENT_AMBULATORY_CARE_PROVIDER_SITE_OTHER): Payer: Medicare Other

## 2012-11-09 DIAGNOSIS — Z79899 Other long term (current) drug therapy: Secondary | ICD-10-CM

## 2012-11-09 LAB — HEPATIC FUNCTION PANEL
ALT: 24 U/L (ref 0–53)
AST: 19 U/L (ref 0–37)
Alkaline Phosphatase: 60 U/L (ref 39–117)
Bilirubin, Direct: 0.1 mg/dL (ref 0.0–0.3)
Total Protein: 7 g/dL (ref 6.0–8.3)

## 2012-11-09 LAB — CBC WITH DIFFERENTIAL/PLATELET
Basophils Relative: 1.3 % (ref 0.0–3.0)
Eosinophils Relative: 4.1 % (ref 0.0–5.0)
HCT: 40.1 % (ref 39.0–52.0)
MCV: 90.3 fl (ref 78.0–100.0)
Monocytes Absolute: 0.5 10*3/uL (ref 0.1–1.0)
Monocytes Relative: 11 % (ref 3.0–12.0)
Neutrophils Relative %: 47.7 % (ref 43.0–77.0)
RBC: 4.45 Mil/uL (ref 4.22–5.81)
WBC: 4.6 10*3/uL (ref 4.5–10.5)

## 2012-11-15 ENCOUNTER — Other Ambulatory Visit (INDEPENDENT_AMBULATORY_CARE_PROVIDER_SITE_OTHER): Payer: Medicare Other

## 2012-11-15 ENCOUNTER — Other Ambulatory Visit: Payer: Medicare Other

## 2012-11-15 DIAGNOSIS — Z79899 Other long term (current) drug therapy: Secondary | ICD-10-CM

## 2012-11-15 LAB — CBC WITH DIFFERENTIAL/PLATELET
Basophils Absolute: 0 10*3/uL (ref 0.0–0.1)
Eosinophils Absolute: 0.1 10*3/uL (ref 0.0–0.7)
Lymphocytes Relative: 26.6 % (ref 12.0–46.0)
MCHC: 34.4 g/dL (ref 30.0–36.0)
MCV: 90.3 fl (ref 78.0–100.0)
Monocytes Absolute: 0.5 10*3/uL (ref 0.1–1.0)
Neutrophils Relative %: 60.1 % (ref 43.0–77.0)
Platelets: 248 10*3/uL (ref 150.0–400.0)
RBC: 4.42 Mil/uL (ref 4.22–5.81)
RDW: 13.7 % (ref 11.5–14.6)

## 2012-11-15 LAB — HEPATIC FUNCTION PANEL
Alkaline Phosphatase: 66 U/L (ref 39–117)
Bilirubin, Direct: 0 mg/dL (ref 0.0–0.3)
Total Bilirubin: 0.5 mg/dL (ref 0.3–1.2)

## 2012-11-22 ENCOUNTER — Other Ambulatory Visit (INDEPENDENT_AMBULATORY_CARE_PROVIDER_SITE_OTHER): Payer: Medicare Other

## 2012-11-22 DIAGNOSIS — Z79899 Other long term (current) drug therapy: Secondary | ICD-10-CM

## 2012-11-22 LAB — CBC WITH DIFFERENTIAL/PLATELET
Basophils Absolute: 0 10*3/uL (ref 0.0–0.1)
Basophils Relative: 1 % (ref 0.0–3.0)
Eosinophils Absolute: 0.1 10*3/uL (ref 0.0–0.7)
HCT: 41.3 % (ref 39.0–52.0)
Hemoglobin: 14.1 g/dL (ref 13.0–17.0)
Lymphs Abs: 1.4 10*3/uL (ref 0.7–4.0)
MCHC: 34 g/dL (ref 30.0–36.0)
Neutro Abs: 3.1 10*3/uL (ref 1.4–7.7)
RBC: 4.55 Mil/uL (ref 4.22–5.81)
RDW: 13.8 % (ref 11.5–14.6)

## 2012-11-22 LAB — HEPATIC FUNCTION PANEL
ALT: 31 U/L (ref 0–53)
Albumin: 3.7 g/dL (ref 3.5–5.2)
Total Bilirubin: 0.7 mg/dL (ref 0.3–1.2)

## 2013-01-02 ENCOUNTER — Other Ambulatory Visit (INDEPENDENT_AMBULATORY_CARE_PROVIDER_SITE_OTHER): Payer: Medicare Other

## 2013-01-02 DIAGNOSIS — Z0000 Encounter for general adult medical examination without abnormal findings: Secondary | ICD-10-CM

## 2013-01-02 LAB — HEPATIC FUNCTION PANEL
ALT: 19 U/L (ref 0–53)
AST: 18 U/L (ref 0–37)
Albumin: 3.3 g/dL — ABNORMAL LOW (ref 3.5–5.2)
Alkaline Phosphatase: 67 U/L (ref 39–117)

## 2013-01-02 LAB — BASIC METABOLIC PANEL
Calcium: 8.5 mg/dL (ref 8.4–10.5)
GFR: 103.7 mL/min (ref >60.00)
Glucose, Bld: 85 mg/dL (ref 70–99)
Potassium: 4.4 mEq/L (ref 3.5–5.1)
Sodium: 135 mEq/L (ref 135–145)

## 2013-01-02 LAB — POCT URINALYSIS DIPSTICK
Blood, UA: NEGATIVE
Ketones, UA: NEGATIVE
Protein, UA: NEGATIVE
Spec Grav, UA: 1.01
Urobilinogen, UA: 0.2

## 2013-01-02 LAB — CBC WITH DIFFERENTIAL/PLATELET
Basophils Relative: 1.3 % (ref 0.0–3.0)
Eosinophils Relative: 3.3 % (ref 0.0–5.0)
HCT: 38.7 % — ABNORMAL LOW (ref 39.0–52.0)
Hemoglobin: 13.4 g/dL (ref 13.0–17.0)
Lymphocytes Relative: 31.2 % (ref 12.0–46.0)
Lymphs Abs: 1.6 10*3/uL (ref 0.7–4.0)
Monocytes Relative: 10.1 % (ref 3.0–12.0)
Neutro Abs: 2.8 10*3/uL (ref 1.4–7.7)
RBC: 4.22 Mil/uL (ref 4.22–5.81)
RDW: 14.1 % (ref 11.5–14.6)
WBC: 5.2 10*3/uL (ref 4.5–10.5)

## 2013-01-02 LAB — LIPID PANEL
Cholesterol: 116 mg/dL (ref 0–200)
LDL Cholesterol: 62 mg/dL (ref 0–99)
Total CHOL/HDL Ratio: 2
Triglycerides: 29 mg/dL (ref 0.0–149.0)
VLDL: 5.8 mg/dL (ref 0.0–40.0)

## 2013-01-03 LAB — TSH: TSH: 1.16 u[IU]/mL (ref 0.35–5.50)

## 2013-01-09 ENCOUNTER — Encounter: Payer: Self-pay | Admitting: Internal Medicine

## 2013-01-09 ENCOUNTER — Ambulatory Visit (INDEPENDENT_AMBULATORY_CARE_PROVIDER_SITE_OTHER): Payer: Medicare Other | Admitting: Internal Medicine

## 2013-01-09 VITALS — BP 130/80 | HR 72 | Temp 98.1°F | Resp 16 | Ht 66.0 in | Wt 195.0 lb

## 2013-01-09 DIAGNOSIS — T887XXA Unspecified adverse effect of drug or medicament, initial encounter: Secondary | ICD-10-CM

## 2013-01-09 DIAGNOSIS — Z0000 Encounter for general adult medical examination without abnormal findings: Secondary | ICD-10-CM

## 2013-01-09 DIAGNOSIS — N200 Calculus of kidney: Secondary | ICD-10-CM

## 2013-01-09 NOTE — Patient Instructions (Addendum)
Tell your [facility] doctor to look for your blood work under "care everywhere"

## 2013-01-09 NOTE — Progress Notes (Signed)
  Subjective:    Patient ID: Jay Webster, male    DOB: 07/14/1932, 79 y.o.   MRN: [id number]  HPI  Wellness exam Discussion of the need for hip replacement Hypothyroid HTN stable On Methotrexate Need screening for hep C due to the MXT  Review of Systems  Constitutional: Negative for fever and fatigue.  HENT: Negative for hearing loss, congestion, neck pain and postnasal drip.   Eyes: Negative for discharge, redness and visual disturbance.  Respiratory: Negative for cough, shortness of breath and wheezing.   Cardiovascular: Negative for leg swelling.  Gastrointestinal: Negative for abdominal pain, constipation and abdominal distention.  Genitourinary: Negative for urgency and frequency.  Musculoskeletal: Positive for joint swelling, arthralgias and gait problem.  Skin: Negative for color change and rash.  Neurological: Negative for weakness and light-headedness.  Hematological: Negative for adenopathy.  Psychiatric/Behavioral: Negative for behavioral problems.       Objective:   Physical Exam  Nursing note and vitals reviewed. Constitutional: He is oriented to person, place, and time. He appears well-developed and well-nourished.  HENT:  Head: Normocephalic and atraumatic.  Eyes: Conjunctivae are normal. Pupils are equal, round, and reactive to light.  Neck: Normal range of motion. Neck supple.  Cardiovascular: Normal rate and regular rhythm.   Pulmonary/Chest: Effort normal and breath sounds normal.  Abdominal: Soft. Bowel sounds are normal.  Musculoskeletal: He exhibits edema and tenderness.  Left hip decreased ROM  Neurological: He is alert and oriented to person, place, and time.  Skin: Skin is warm and dry.  Psychiatric: He has a normal mood and affect. His behavior is normal.          Assessment & Plan:

## 2013-01-17 ENCOUNTER — Encounter: Payer: Self-pay | Admitting: Internal Medicine

## 2013-01-18 ENCOUNTER — Telehealth: Payer: Self-pay | Admitting: Internal Medicine

## 2013-01-18 NOTE — Telephone Encounter (Signed)
Updated health maintenance

## 2013-03-27 ENCOUNTER — Other Ambulatory Visit: Payer: Self-pay | Admitting: Internal Medicine

## 2013-04-03 ENCOUNTER — Telehealth: Payer: Self-pay | Admitting: Internal Medicine

## 2013-04-03 NOTE — Telephone Encounter (Signed)
PT called and stated that he will be having surgery with Dr. Lequita Halt on 05/31/13. He will be seeing the PA on 05/16/13, and they would like him to have his surgical clearance prior to then. He would like to see Dr. Lovell Sheehan. Please assist.

## 2013-04-03 NOTE — Telephone Encounter (Signed)
Pt informed , they can send paper her for clearance and dr Lovell Sheehan will sign

## 2013-04-04 ENCOUNTER — Encounter (HOSPITAL_COMMUNITY): Payer: Self-pay | Admitting: Pharmacy Technician

## 2013-04-04 NOTE — Progress Notes (Signed)
Need orders in EPIC.  Surgery scheduled for 05/31/13.  Preop on 05/16/13 at 0800am.  Thank You.

## 2013-04-15 ENCOUNTER — Other Ambulatory Visit: Payer: Self-pay | Admitting: Orthopedic Surgery

## 2013-04-15 NOTE — Progress Notes (Signed)
Preoperative surgical orders have been place into the Epic hospital system for Jay Webster on 04/15/2013, 10:52 AM  by Patrica Duel for surgery on 05/31/13.  Preop Total Hip - Anterior Approach orders including Experel Injecion, IV Tylenol, and IV Decadron as long as there are no contraindications to the above medications. Avel Peace, PA-C

## 2013-05-16 ENCOUNTER — Other Ambulatory Visit: Payer: Self-pay | Admitting: Orthopedic Surgery

## 2013-05-16 ENCOUNTER — Encounter (HOSPITAL_COMMUNITY): Payer: Self-pay

## 2013-05-16 ENCOUNTER — Encounter (HOSPITAL_COMMUNITY)
Admission: RE | Admit: 2013-05-16 | Discharge: 2013-05-16 | Disposition: A | Discharge status: Home / Self Care | Payer: Medicare Other | Source: Ambulatory Visit | Attending: Orthopedic Surgery | Admitting: Orthopedic Surgery

## 2013-05-16 ENCOUNTER — Ambulatory Visit (HOSPITAL_COMMUNITY)
Admission: RE | Admit: 2013-05-16 | Discharge: 2013-05-16 | Disposition: A | Discharge status: Home / Self Care | Payer: Medicare Other | Source: Ambulatory Visit | Attending: Orthopedic Surgery | Admitting: Orthopedic Surgery

## 2013-05-16 DIAGNOSIS — Z01810 Encounter for preprocedural cardiovascular examination: Secondary | ICD-10-CM | POA: Insufficient documentation

## 2013-05-16 DIAGNOSIS — M169 Osteoarthritis of hip, unspecified: Secondary | ICD-10-CM | POA: Insufficient documentation

## 2013-05-16 DIAGNOSIS — I10 Essential (primary) hypertension: Secondary | ICD-10-CM | POA: Insufficient documentation

## 2013-05-16 DIAGNOSIS — Z01818 Encounter for other preprocedural examination: Secondary | ICD-10-CM | POA: Insufficient documentation

## 2013-05-16 DIAGNOSIS — Z01812 Encounter for preprocedural laboratory examination: Secondary | ICD-10-CM | POA: Insufficient documentation

## 2013-05-16 DIAGNOSIS — M1610 Unilateral primary osteoarthritis, unspecified hip: Secondary | ICD-10-CM | POA: Insufficient documentation

## 2013-05-16 HISTORY — DX: Personal history of urinary calculi: Z87.442

## 2013-05-16 HISTORY — DX: Other seasonal allergic rhinitis: J30.2

## 2013-05-16 HISTORY — DX: Unspecified macular degeneration: H35.30

## 2013-05-16 HISTORY — DX: Hypothyroidism, unspecified: E03.9

## 2013-05-16 HISTORY — DX: Personal history of other infectious and parasitic diseases: Z86.19

## 2013-05-16 HISTORY — DX: Malignant (primary) neoplasm, unspecified: C80.1

## 2013-05-16 LAB — URINALYSIS, ROUTINE W REFLEX MICROSCOPIC
Bilirubin Urine: NEGATIVE
Ketones, ur: NEGATIVE mg/dL
Leukocytes, UA: NEGATIVE
Nitrite: NEGATIVE
Protein, ur: NEGATIVE mg/dL
Urobilinogen, UA: 0.2 mg/dL (ref 0.0–1.0)

## 2013-05-16 LAB — CBC
HCT: 40.3 % (ref 39.0–52.0)
MCH: 30.7 pg (ref 26.0–34.0)
MCV: 87.6 fL (ref 78.0–100.0)
Platelets: 222 10*3/uL (ref 150–400)
RBC: 4.6 MIL/uL (ref 4.22–5.81)
WBC: 4.5 10*3/uL (ref 4.0–10.5)

## 2013-05-16 LAB — PROTIME-INR
INR: 1.03 (ref 0.00–1.49)
Prothrombin Time: 13.3 seconds (ref 11.6–15.2)

## 2013-05-16 LAB — COMPREHENSIVE METABOLIC PANEL
BUN: 17 mg/dL (ref 6–23)
CO2: 27 mEq/L (ref 19–32)
Calcium: 9.4 mg/dL (ref 8.4–10.5)
Chloride: 103 mEq/L (ref 96–112)
Creatinine, Ser: 0.71 mg/dL (ref 0.50–1.35)
GFR calc Af Amer: >90 mL/min (ref >90)
GFR calc non Af Amer: 88 mL/min — ABNORMAL LOW (ref >90)
Total Bilirubin: 0.3 mg/dL (ref 0.3–1.2)

## 2013-05-16 LAB — SURGICAL PCR SCREEN: MRSA, PCR: NEGATIVE

## 2013-05-16 NOTE — Pre-Procedure Instructions (Signed)
PT'S PTT ELEVATED, PT, INR W/IN NORMALS.  PTT, PT REPORTS FAXED TO DR. ALUISIO'S OFFICE ALONG WITH NASAL SWAB REPORT POSITIVE FOR [facility]- AND WENDY AT DR'S OFFICE NOTIFIED OF POSITIVE [facility] AND FAX SENT.

## 2013-05-16 NOTE — Pre-Procedure Instructions (Signed)
EKG, CXR, LEFT HIP XRAY WERE DONE TODAY - PREOP AT [facility].

## 2013-05-16 NOTE — Patient Instructions (Addendum)
YOUR SURGERY IS SCHEDULED AT [facility]  ON:  Wednesday  9/24  REPORT TO [place] SHORT STAY CENTER AT:  5:15 AM      PHONE # FOR SHORT STAY IS [phone]  DO NOT EAT OR DRINK ANYTHING AFTER MIDNIGHT THE NIGHT BEFORE YOUR SURGERY.  YOU MAY BRUSH YOUR TEETH, RINSE OUT YOUR MOUTH--BUT NO WATER, NO FOOD, NO CHEWING GUM, NO MINTS, NO CANDIES, NO CHEWING TOBACCO.  PLEASE TAKE THE FOLLOWING MEDICATIONS THE AM OF YOUR SURGERY WITH A FEW SIPS OF WATER:  LEVOTHYROXINE    DO NOT BRING VALUABLES, MONEY, CREDIT CARDS.  DO NOT WEAR JEWELRY, MAKE-UP, NAIL POLISH AND NO METAL PINS OR CLIPS IN YOUR HAIR. CONTACT LENS, DENTURES / PARTIALS, GLASSES SHOULD NOT BE WORN TO SURGERY AND IN MOST CASES-HEARING AIDS WILL NEED TO BE REMOVED.  BRING YOUR GLASSES CASE, ANY EQUIPMENT NEEDED FOR YOUR CONTACT LENS. FOR PATIENTS ADMITTED TO THE HOSPITAL--CHECK OUT TIME THE DAY OF DISCHARGE IS 11:00 AM.  ALL INPATIENT ROOMS ARE PRIVATE - WITH BATHROOM, TELEPHONE, TELEVISION AND WIFI INTERNET.                             PLEASE READ OVER ANY  FACT SHEETS THAT YOU WERE GIVEN: MRSA INFORMATION, BLOOD TRANSFUSION INFORMATION, INCENTIVE SPIROMETER INFORMATION. FAILURE TO FOLLOW THESE INSTRUCTIONS MAY RESULT IN THE CANCELLATION OF YOUR SURGERY.   PATIENT SIGNATURE_________________________________

## 2013-05-17 ENCOUNTER — Ambulatory Visit (INDEPENDENT_AMBULATORY_CARE_PROVIDER_SITE_OTHER): Payer: Medicare Other

## 2013-05-17 DIAGNOSIS — Z23 Encounter for immunization: Secondary | ICD-10-CM

## 2013-05-30 ENCOUNTER — Other Ambulatory Visit: Payer: Self-pay | Admitting: Orthopedic Surgery

## 2013-05-30 NOTE — Anesthesia Preprocedure Evaluation (Addendum)
Anesthesia Evaluation  Patient identified by MRN, date of birth, ID band Patient awake    Reviewed: Allergy & Precautions, H&P , NPO status , Patient's Chart, lab work & pertinent test results  Airway Mallampati: II TM Distance: >3 FB     Dental  (+) Teeth Intact, Chipped and Dental Advisory Given,    Pulmonary former smoker,  breath sounds clear to auscultation  Pulmonary exam normal       Cardiovascular hypertension, Pt. on medications + dysrhythmias Rhythm:Regular     Neuro/Psych negative neurological ROS  negative psych ROS   GI/Hepatic negative GI ROS, Neg liver ROS,   Endo/Other  Hypothyroidism   Renal/GU negative Renal ROS  negative genitourinary   Musculoskeletal negative musculoskeletal ROS (+)   Abdominal   Peds  Hematology negative hematology ROS (+)   Anesthesia Other Findings   Reproductive/Obstetrics                          Anesthesia Physical Anesthesia Plan  ASA: II  Anesthesia Plan: General   Post-op Pain Management:    Induction: Intravenous  Airway Management Planned: Oral ETT  Additional Equipment:   Intra-op Plan:   Post-operative Plan: Extubation in OR  Informed Consent: I have reviewed the patients History and Physical, chart, labs and discussed the procedure including the risks, benefits and alternatives for the proposed anesthesia with the patient or authorized representative who has indicated his/her understanding and acceptance.   Dental advisory given  Plan Discussed with: CRNA  Anesthesia Plan Comments:         Anesthesia Quick Evaluation

## 2013-05-30 NOTE — H&P (Signed)
Jay Webster  DOB: [birth year] Single / Language: [place] / Race: White Male  Date of Admission:  05/31/2013  Chief Complaint:  Left Hip Pain  History of Present Illness The patient is a 79 year old male who comes in  for a preoperative History and Physical. The patient is scheduled for a total hip arthroplasty (anterior approach) to be performed by Dr. Gus Rankin. Aluisio, MD at [facility] on 05/31/2013. The patient is a 79 year old male who presents for follow up of their hip. The patient is being followed for their left hip pain and osteoarthritis. They are 1 year(s) (5 months IA with Dr. Ethelene Hal) out. Symptoms reported today include: pain. The patient feels that they are doing poorly (shot only lasted week so decided to watch it) and report their pain level to be moderate (good days bad days). The following medication has been used for pain control: none. The hip is getting progressively worse. This is starting to limit him more as to what he can and can not do. He said for the most part he can tolerate the pain but the function is getting intolerable. He is ready to proceed with surgery. They have been treated conservatively in the past for the above stated problem and despite conservative measures, they continue to have progressive pain and severe functional limitations and dysfunction. They have failed non-operative management including home exercise, medications, and injections. It is felt that they would benefit from undergoing total joint replacement. Risks and benefits of the procedure have been discussed with the patient and they elect to proceed with surgery. There are no active contraindications to surgery such as ongoing infection or rapidly progressive neurological disease.   Problem List Osteoarthrosis NOS, pelvis/thigh (715.95). 01/30/2011   Allergies PENICILLIN. 11/15/2007 Swelling. Palpitations   Family History Cancer. brother Diabetes Mellitus.  brother Osteoarthritis. mother   Social History Drug/Alcohol Rehab (Currently). no Exercise. Exercises rarely Living situation. live with partner Alcohol use. current drinker; drinks beer, wine and hard liquor; less than 5 per week Children. 0 Current work status. retired Previously in rehab. no Tobacco / smoke exposure. no Tobacco use. former smoker; smoke(d) 3 or more pack(s) per day Marital status. single Number of flights of stairs before winded. 4-5 Pain Contract. no Advance Directives. Living Will, Healthcare POA Post-Surgical Plans. Home   Medication History Allopurinol (300MG  Tablet, Oral) Active. Benazepril HCl (20MG  Tablet, Oral) Active. Levothyroxine Sodium ( Tablet, Oral) Active.   Past Surgical History Thyroidectomy; Total. Date: 2010. Rotator Cuff Repair. right Other Surgery. 1st rib removed right side. It and the collarbone had closed off a blood vain to the right arm Appendectomy. Date: 38. Kidney Stone Surgery. 1952, 1971 Elbow Radial Head Resection. Date: 76.   Medical History Kidney Stone. 1986 Skin Cancer High blood pressure Hepatitis B Impaired Vision Impaired Hearing Macular Degeneration Cataract Diverticulosis Hypothyroidism Measles Mumps Rubella Thyroid Cancer. Surgical Removal - No treatment   Review of Systems General:Not Present- Chills, Fever, Night Sweats, Fatigue, Weight Gain, Weight Loss and Memory Loss. Skin:Not Present- Hives, Itching, Rash, Eczema and Lesions. HEENT:Present- Decreased Hearing. Not Present- Tinnitus, Headache, Double Vision, Visual Loss, Hearing Loss and Dentures. Respiratory:Not Present- Shortness of breath with exertion, Shortness of breath at rest, Allergies, Coughing up blood and Chronic Cough. Cardiovascular:Not Present- Chest Pain, Racing/skipping heartbeats, Difficulty Breathing Lying Down, Murmur, Swelling and Palpitations. Gastrointestinal:Not Present-  Bloody Stool, Heartburn, Abdominal Pain, Vomiting, Nausea, Constipation, Diarrhea, Difficulty Swallowing, Jaundice and Loss of appetitie. Male Genitourinary:Not Present-  Urinary frequency, Blood in Urine, Weak urinary stream, Discharge, Flank Pain, Incontinence, Painful Urination, Urgency, Urinary Retention and Urinating at Night. Musculoskeletal:Present- Morning Stiffness. Not Present- Muscle Weakness, Muscle Pain, Joint Swelling, Joint Pain, Back Pain and Spasms. Neurological:Not Present- Tremor, Dizziness, Blackout spells, Paralysis, Difficulty with balance and Weakness. Psychiatric:Not Present- Insomnia.   Vitals Pulse: 72 (Regular) Resp.: 14 (Unlabored) BP: 142/72 (Sitting, Left Arm, Standard)    Physical Exam The physical exam findings are as follows:   General Mental Status - Alert, cooperative and good historian. General Appearance- pleasant. Not in acute distress. Orientation- Oriented X3. Build & Nutrition- Well nourished and Well developed.   Head and Neck Head- normocephalic, atraumatic . Neck Global Assessment- supple. no bruit auscultated on the right and no bruit auscultated on the left.   Eye Vision- Wears corrective lenses. Pupil- Bilateral- Regular and Round. Motion- Bilateral- EOMI.   ENMT EarsNote: bilateral hearing aids partial lower bridge  Chest and Lung Exam Auscultation: Breath sounds:- clear at anterior chest wall and - clear at posterior chest wall. Adventitious sounds:- No Adventitious sounds.   Cardiovascular Auscultation:Rhythm- Regular rate and rhythm. Heart Sounds- S1 WNL and S2 WNL. Murmurs & Other Heart Sounds:Auscultation of the heart reveals - No Murmurs.   Abdomen Palpation/Percussion:Tenderness- Abdomen is non-tender to palpation. Rigidity (guarding)- Abdomen is soft. Auscultation:Auscultation of the abdomen reveals - Bowel sounds normal.   Male Genitourinary Not done, not  pertinent to present illness  Musculoskeletal He is alert and oriented. No apparent distress. The left hip can be flexed to 90 with no internal rotation. There is about 5 degrees of external rotation and 10 degrees of abduction. The right hip has a normal range of motion. He is not having any trochanteric tenderness at all. He does have pain on range of motion of that left hip. His knee examination is normal. Pulse, sensation and motor are intact.  RADIOGRAPHS: AP of the pelvis and lateral of the left hip show he is now completely bone on bone of the left hip with some small erosion in the superior femoral head. He also has a subchondral cystic formation in the femoral head and acetabulum. We compared these X-rays with those of a year and a half ago and he has had significant progression during that time span.  Assessment & Plan Osteoarthrosis NOS, pelvis/thigh (715.95) Impression: Left Hip  Note: Plan is for a Left Total Hip Replacement - Anterior Approach by Dr. Lequita Halt.  Plan is to go home.  PCP - Dr. Darryll Capers - Patient has been seen preoperatively and felt to be stable for surgery.   The patient does not have any contraindications and will recieve TXA (tranexamic acid) prior to surgery.  Signed electronically by Lauraine Rinne, III PA-C

## 2013-05-31 ENCOUNTER — Encounter (HOSPITAL_COMMUNITY): Payer: Self-pay | Admitting: Anesthesiology

## 2013-05-31 ENCOUNTER — Inpatient Hospital Stay (HOSPITAL_COMMUNITY): Payer: Medicare Other

## 2013-05-31 ENCOUNTER — Encounter (HOSPITAL_COMMUNITY)
Admission: RE | Disposition: A | Discharge status: Home Health | Payer: Self-pay | Source: Ambulatory Visit | Attending: Orthopedic Surgery

## 2013-05-31 ENCOUNTER — Inpatient Hospital Stay (HOSPITAL_COMMUNITY)
Admission: RE | Admit: 2013-05-31 | Discharge: 2013-06-02 | DRG: 470 | Disposition: A | Discharge status: Home Health | Payer: Medicare Other | Source: Ambulatory Visit | Attending: Orthopedic Surgery | Admitting: Orthopedic Surgery

## 2013-05-31 ENCOUNTER — Inpatient Hospital Stay (HOSPITAL_COMMUNITY): Payer: Medicare Other | Admitting: Anesthesiology

## 2013-05-31 ENCOUNTER — Encounter (HOSPITAL_COMMUNITY): Payer: Self-pay | Admitting: *Deleted

## 2013-05-31 DIAGNOSIS — Z96649 Presence of unspecified artificial hip joint: Secondary | ICD-10-CM

## 2013-05-31 DIAGNOSIS — E871 Hypo-osmolality and hyponatremia: Secondary | ICD-10-CM | POA: Diagnosis not present

## 2013-05-31 DIAGNOSIS — M169 Osteoarthritis of hip, unspecified: Secondary | ICD-10-CM

## 2013-05-31 DIAGNOSIS — M1610 Unilateral primary osteoarthritis, unspecified hip: Principal | ICD-10-CM | POA: Diagnosis present

## 2013-05-31 DIAGNOSIS — H9190 Unspecified hearing loss, unspecified ear: Secondary | ICD-10-CM | POA: Diagnosis present

## 2013-05-31 DIAGNOSIS — E890 Postprocedural hypothyroidism: Secondary | ICD-10-CM | POA: Diagnosis present

## 2013-05-31 DIAGNOSIS — Z96642 Presence of left artificial hip joint: Secondary | ICD-10-CM | POA: Insufficient documentation

## 2013-05-31 DIAGNOSIS — Z87891 Personal history of nicotine dependence: Secondary | ICD-10-CM

## 2013-05-31 DIAGNOSIS — Z85850 Personal history of malignant neoplasm of thyroid: Secondary | ICD-10-CM

## 2013-05-31 DIAGNOSIS — D62 Acute posthemorrhagic anemia: Secondary | ICD-10-CM

## 2013-05-31 HISTORY — PX: TOTAL HIP ARTHROPLASTY: SHX124

## 2013-05-31 LAB — TYPE AND SCREEN

## 2013-05-31 SURGERY — ARTHROPLASTY, HIP, TOTAL, ANTERIOR APPROACH
Anesthesia: General | Site: Hip | Laterality: Left | Wound class: Clean

## 2013-05-31 MED ORDER — METOCLOPRAMIDE HCL 5 MG/ML IJ SOLN: 5.0000 mg | Freq: Three times a day (TID) | INTRAMUSCULAR | Status: DC | PRN

## 2013-05-31 MED ORDER — ONDANSETRON HCL 4 MG PO TABS: 4.0000 mg | ORAL_TABLET | Freq: Four times a day (QID) | ORAL | Status: DC | PRN

## 2013-05-31 MED ORDER — MENTHOL 3 MG MT LOZG: 1.0000 | LOZENGE | OROMUCOSAL | Status: DC | PRN

## 2013-05-31 MED ORDER — METOCLOPRAMIDE HCL 10 MG PO TABS
5.0000 mg | ORAL_TABLET | Freq: Three times a day (TID) | ORAL | Status: DC | PRN
Start: 2013-05-31 — End: 2013-06-02

## 2013-05-31 MED ORDER — LACTATED RINGERS IV SOLN: INTRAVENOUS | Status: DC

## 2013-05-31 MED ORDER — SODIUM CHLORIDE 0.9 % IJ SOLN
INTRAMUSCULAR | Status: AC
  Filled 2013-05-31: qty 50

## 2013-05-31 MED ORDER — OXYCODONE HCL 5 MG PO TABS
5.0000 mg | ORAL_TABLET | ORAL | Status: DC | PRN
  Administered 2013-05-31 – 2013-06-01 (×2): 5 mg via ORAL
  Administered 2013-06-01: 10 mg via ORAL
  Administered 2013-06-01 – 2013-06-02 (×4): 5 mg via ORAL
  Filled 2013-05-31 (×3): qty 1
  Filled 2013-05-31: qty 2
  Filled 2013-05-31 (×3): qty 1

## 2013-05-31 MED ORDER — ACETAMINOPHEN 500 MG PO TABS
1000.0000 mg | ORAL_TABLET | Freq: Four times a day (QID) | ORAL | Status: AC
  Administered 2013-05-31 – 2013-06-01 (×4): 1000 mg via ORAL
  Filled 2013-05-31 (×4): qty 2

## 2013-05-31 MED ORDER — PHENOL 1.4 % MT LIQD: 1.0000 | OROMUCOSAL | Status: DC | PRN

## 2013-05-31 MED ORDER — RIVAROXABAN 10 MG PO TABS
10.0000 mg | ORAL_TABLET | Freq: Every day | ORAL | Status: DC
  Administered 2013-06-01 – 2013-06-02 (×2): 10 mg via ORAL
  Filled 2013-05-31 (×3): qty 1

## 2013-05-31 MED ORDER — DEXAMETHASONE SODIUM PHOSPHATE 10 MG/ML IJ SOLN
INTRAMUSCULAR | Status: DC | PRN
  Administered 2013-05-31: 10 mg via INTRAVENOUS

## 2013-05-31 MED ORDER — POLYETHYLENE GLYCOL 3350 17 G PO PACK: 17.0000 g | PACK | Freq: Every day | ORAL | Status: DC | PRN

## 2013-05-31 MED ORDER — BUPIVACAINE HCL (PF) 0.25 % IJ SOLN
INTRAMUSCULAR | Status: AC
  Filled 2013-05-31: qty 30

## 2013-05-31 MED ORDER — SODIUM CHLORIDE 0.9 % IV SOLN
INTRAVENOUS | Status: DC
  Administered 2013-05-31 – 2013-06-01 (×2): via INTRAVENOUS

## 2013-05-31 MED ORDER — STERILE WATER FOR IRRIGATION IR SOLN
Status: DC | PRN
  Administered 2013-05-31: 3000 mL

## 2013-05-31 MED ORDER — SODIUM CHLORIDE 0.9 % IJ SOLN
INTRAMUSCULAR | Status: DC | PRN
  Administered 2013-05-31: 09:00:00

## 2013-05-31 MED ORDER — NEOSTIGMINE METHYLSULFATE 1 MG/ML IJ SOLN
INTRAMUSCULAR | Status: DC | PRN
  Administered 2013-05-31: 5 mg via INTRAVENOUS

## 2013-05-31 MED ORDER — VANCOMYCIN HCL IN DEXTROSE 1-5 GM/200ML-% IV SOLN
1000.0000 mg | Freq: Two times a day (BID) | INTRAVENOUS | Status: AC
  Administered 2013-05-31: 1000 mg via INTRAVENOUS
  Filled 2013-05-31: qty 200

## 2013-05-31 MED ORDER — BUPIVACAINE LIPOSOME 1.3 % IJ SUSP
20.0000 mL | Freq: Once | INTRAMUSCULAR | Status: DC
  Filled 2013-05-31: qty 20

## 2013-05-31 MED ORDER — ONDANSETRON HCL 4 MG/2ML IJ SOLN: 4.0000 mg | Freq: Four times a day (QID) | INTRAMUSCULAR | Status: DC | PRN

## 2013-05-31 MED ORDER — EPHEDRINE SULFATE 50 MG/ML IJ SOLN
INTRAMUSCULAR | Status: DC | PRN
  Administered 2013-05-31: 5 mg via INTRAVENOUS
  Administered 2013-05-31: 10 mg via INTRAVENOUS

## 2013-05-31 MED ORDER — BENAZEPRIL HCL 20 MG PO TABS
20.0000 mg | ORAL_TABLET | Freq: Every day | ORAL | Status: DC
  Administered 2013-05-31 – 2013-06-02 (×3): 20 mg via ORAL
  Filled 2013-05-31 (×4): qty 1

## 2013-05-31 MED ORDER — PROMETHAZINE HCL 25 MG/ML IJ SOLN: 6.2500 mg | INTRAMUSCULAR | Status: DC | PRN

## 2013-05-31 MED ORDER — GLYCOPYRROLATE 0.2 MG/ML IJ SOLN
INTRAMUSCULAR | Status: DC | PRN
  Administered 2013-05-31: .8 mg via INTRAVENOUS

## 2013-05-31 MED ORDER — BUPIVACAINE HCL (PF) 0.25 % IJ SOLN
INTRAMUSCULAR | Status: DC | PRN
  Administered 2013-05-31: 20 mL

## 2013-05-31 MED ORDER — HYDROMORPHONE HCL PF 1 MG/ML IJ SOLN
0.2500 mg | INTRAMUSCULAR | Status: DC | PRN
  Administered 2013-05-31 (×2): 0.5 mg via INTRAVENOUS

## 2013-05-31 MED ORDER — TRANEXAMIC ACID 100 MG/ML IV SOLN
1000.0000 mg | INTRAVENOUS | Status: AC
  Administered 2013-05-31: 1000 mg via INTRAVENOUS
  Filled 2013-05-31: qty 10

## 2013-05-31 MED ORDER — DOCUSATE SODIUM 100 MG PO CAPS
100.0000 mg | ORAL_CAPSULE | Freq: Two times a day (BID) | ORAL | Status: DC
  Administered 2013-05-31 – 2013-06-02 (×4): 100 mg via ORAL

## 2013-05-31 MED ORDER — FLEET ENEMA 7-19 GM/118ML RE ENEM: 1.0000 | ENEMA | Freq: Once | RECTAL | Status: AC | PRN

## 2013-05-31 MED ORDER — ACETAMINOPHEN 10 MG/ML IV SOLN
1000.0000 mg | Freq: Once | INTRAVENOUS | Status: AC
  Administered 2013-05-31: 1000 mg via INTRAVENOUS
  Filled 2013-05-31: qty 100

## 2013-05-31 MED ORDER — LACTATED RINGERS IV SOLN
INTRAVENOUS | Status: DC | PRN
  Administered 2013-05-31 (×2): via INTRAVENOUS

## 2013-05-31 MED ORDER — CHLORHEXIDINE GLUCONATE 4 % EX LIQD
60.0000 mL | Freq: Once | CUTANEOUS | Status: DC
  Filled 2013-05-31: qty 60

## 2013-05-31 MED ORDER — SODIUM CHLORIDE 0.9 % IV SOLN
1500.0000 mg | INTRAVENOUS | Status: AC
  Administered 2013-05-31: 1500 mg via INTRAVENOUS
  Filled 2013-05-31: qty 1500

## 2013-05-31 MED ORDER — SODIUM CHLORIDE 0.9 % IV SOLN: INTRAVENOUS | Status: DC

## 2013-05-31 MED ORDER — DEXAMETHASONE 4 MG PO TABS
10.0000 mg | ORAL_TABLET | Freq: Every day | ORAL | Status: AC
  Administered 2013-06-01: 10 mg via ORAL
  Filled 2013-05-31: qty 1

## 2013-05-31 MED ORDER — LEVOTHYROXINE SODIUM 150 MCG PO TABS
150.0000 ug | ORAL_TABLET | Freq: Every day | ORAL | Status: DC
  Administered 2013-06-01 – 2013-06-02 (×2): 150 ug via ORAL
  Filled 2013-05-31 (×4): qty 1

## 2013-05-31 MED ORDER — DEXAMETHASONE SODIUM PHOSPHATE 10 MG/ML IJ SOLN
10.0000 mg | Freq: Every day | INTRAMUSCULAR | Status: AC
  Filled 2013-05-31: qty 1

## 2013-05-31 MED ORDER — ONDANSETRON HCL 4 MG/2ML IJ SOLN
INTRAMUSCULAR | Status: DC | PRN
  Administered 2013-05-31: 4 mg via INTRAVENOUS

## 2013-05-31 MED ORDER — KETOROLAC TROMETHAMINE 15 MG/ML IJ SOLN: 7.5000 mg | Freq: Four times a day (QID) | INTRAMUSCULAR | Status: AC | PRN

## 2013-05-31 MED ORDER — DEXAMETHASONE SODIUM PHOSPHATE 10 MG/ML IJ SOLN: 10.0000 mg | Freq: Once | INTRAMUSCULAR | Status: DC

## 2013-05-31 MED ORDER — TRAMADOL HCL 50 MG PO TABS: 50.0000 mg | ORAL_TABLET | Freq: Four times a day (QID) | ORAL | Status: DC | PRN

## 2013-05-31 MED ORDER — ALLOPURINOL 300 MG PO TABS
300.0000 mg | ORAL_TABLET | Freq: Every day | ORAL | Status: DC
  Administered 2013-05-31 – 2013-06-01 (×2): 300 mg via ORAL
  Filled 2013-05-31 (×3): qty 1

## 2013-05-31 MED ORDER — PROPOFOL 10 MG/ML IV BOLUS
INTRAVENOUS | Status: DC | PRN
  Administered 2013-05-31: 170 mg via INTRAVENOUS

## 2013-05-31 MED ORDER — DIPHENHYDRAMINE HCL 12.5 MG/5ML PO ELIX: 12.5000 mg | ORAL_SOLUTION | ORAL | Status: DC | PRN

## 2013-05-31 MED ORDER — FENTANYL CITRATE 0.05 MG/ML IJ SOLN
INTRAMUSCULAR | Status: DC | PRN
  Administered 2013-05-31 (×5): 50 ug via INTRAVENOUS
  Administered 2013-05-31 (×2): 100 ug via INTRAVENOUS

## 2013-05-31 MED ORDER — METHOCARBAMOL 100 MG/ML IJ SOLN
500.0000 mg | Freq: Four times a day (QID) | INTRAVENOUS | Status: DC | PRN
  Administered 2013-05-31: 500 mg via INTRAVENOUS
  Filled 2013-05-31: qty 5

## 2013-05-31 MED ORDER — CISATRACURIUM BESYLATE (PF) 10 MG/5ML IV SOLN
INTRAVENOUS | Status: DC | PRN
  Administered 2013-05-31: 10 mg via INTRAVENOUS
  Administered 2013-05-31: 4 mg via INTRAVENOUS

## 2013-05-31 MED ORDER — LORATADINE 10 MG PO TABS
10.0000 mg | ORAL_TABLET | Freq: Every day | ORAL | Status: DC | PRN
  Filled 2013-05-31: qty 1

## 2013-05-31 MED ORDER — HYDROMORPHONE HCL PF 1 MG/ML IJ SOLN
INTRAMUSCULAR | Status: AC
  Filled 2013-05-31: qty 1

## 2013-05-31 MED ORDER — 0.9 % SODIUM CHLORIDE (POUR BTL) OPTIME
TOPICAL | Status: DC | PRN
  Administered 2013-05-31: 1000 mL

## 2013-05-31 MED ORDER — MORPHINE SULFATE 2 MG/ML IJ SOLN
1.0000 mg | INTRAMUSCULAR | Status: DC | PRN
  Administered 2013-05-31: 2 mg via INTRAVENOUS
  Filled 2013-05-31: qty 1

## 2013-05-31 MED ORDER — METHOCARBAMOL 500 MG PO TABS
500.0000 mg | ORAL_TABLET | Freq: Four times a day (QID) | ORAL | Status: DC | PRN
  Administered 2013-05-31 – 2013-06-01 (×2): 500 mg via ORAL
  Filled 2013-05-31 (×2): qty 1

## 2013-05-31 MED ORDER — BISACODYL 10 MG RE SUPP: 10.0000 mg | Freq: Every day | RECTAL | Status: DC | PRN

## 2013-05-31 SURGICAL SUPPLY — 42 items
BAG SPEC THK2 15X12 ZIP CLS (MISCELLANEOUS) ×2
BAG ZIPLOCK 12X15 (MISCELLANEOUS) ×4 IMPLANT
BLADE SAW SGTL 18X1.27X75 (BLADE) ×2 IMPLANT
CAPT HIP PF MOP ×2 IMPLANT
CLOTH BEACON ORANGE TIMEOUT ST (SAFETY) ×2 IMPLANT
DECANTER SPIKE VIAL GLASS SM (MISCELLANEOUS) ×2 IMPLANT
DRAPE C-ARM 42X120 X-RAY (DRAPES) ×2 IMPLANT
DRAPE STERI IOBAN 125X83 (DRAPES) ×2 IMPLANT
DRAPE U-SHAPE 47X51 STRL (DRAPES) ×6 IMPLANT
DRSG ADAPTIC 3X8 NADH LF (GAUZE/BANDAGES/DRESSINGS) ×2 IMPLANT
DRSG MEPILEX BORDER 4X4 (GAUZE/BANDAGES/DRESSINGS) ×2 IMPLANT
DRSG MEPILEX BORDER 4X8 (GAUZE/BANDAGES/DRESSINGS) ×2 IMPLANT
DURAPREP 26ML APPLICATOR (WOUND CARE) ×2 IMPLANT
ELECT BLADE 6.5 EXT (BLADE) ×2 IMPLANT
ELECT REM PT RETURN 9FT ADLT (ELECTROSURGICAL) ×2
ELECTRODE REM PT RTRN 9FT ADLT (ELECTROSURGICAL) ×1 IMPLANT
EVACUATOR 1/8 PVC DRAIN (DRAIN) ×2 IMPLANT
FACESHIELD LNG OPTICON STERILE (SAFETY) ×9 IMPLANT
GLOVE BIO SURGEON STRL SZ7.5 (GLOVE) ×2 IMPLANT
GLOVE BIO SURGEON STRL SZ8 (GLOVE) ×4 IMPLANT
GLOVE BIOGEL PI IND STRL 8 (GLOVE) ×2 IMPLANT
GLOVE BIOGEL PI INDICATOR 8 (GLOVE) ×2
GOWN PREVENTION PLUS LG XLONG (DISPOSABLE) ×2 IMPLANT
GOWN STRL REIN XL XLG (GOWN DISPOSABLE) ×2 IMPLANT
KIT BASIN OR (CUSTOM PROCEDURE TRAY) ×2 IMPLANT
NDL SAFETY ECLIPSE 18X1.5 (NEEDLE) ×2 IMPLANT
NEEDLE HYPO 18GX1.5 SHARP (NEEDLE) ×4
PACK TOTAL JOINT (CUSTOM PROCEDURE TRAY) ×2 IMPLANT
PADDING CAST COTTON 6X4 STRL (CAST SUPPLIES) ×2 IMPLANT
SPONGE GAUZE 4X4 12PLY (GAUZE/BANDAGES/DRESSINGS) ×2 IMPLANT
STRIP CLOSURE SKIN 1/2X4 (GAUZE/BANDAGES/DRESSINGS) ×2 IMPLANT
SUCTION FRAZIER 12FR DISP (SUCTIONS) IMPLANT
SUT ETHIBOND NAB CT1 #1 30IN (SUTURE) ×2 IMPLANT
SUT MNCRL AB 4-0 PS2 18 (SUTURE) ×2 IMPLANT
SUT VIC AB 2-0 CT1 27 (SUTURE) ×4
SUT VIC AB 2-0 CT1 TAPERPNT 27 (SUTURE) ×2 IMPLANT
SUT VLOC 180 0 24IN GS25 (SUTURE) ×2 IMPLANT
SYR 20CC LL (SYRINGE) ×2 IMPLANT
SYR 50ML LL SCALE MARK (SYRINGE) ×2 IMPLANT
TOWEL OR 17X26 10 PK STRL BLUE (TOWEL DISPOSABLE) ×2 IMPLANT
TRAY FOLEY CATH 14FRSI W/METER (CATHETERS) ×1 IMPLANT
TRAY FOLEY CATH 16FR SILVER (SET/KITS/TRAYS/PACK) ×1 IMPLANT

## 2013-05-31 NOTE — Interval H&P Note (Signed)
History and Physical Interval Note:  05/31/2013 7:08 AM  Jay Webster  has presented today for surgery, with the diagnosis of OA OF LEFT HIP  The various methods of treatment have been discussed with the patient and family. After consideration of risks, benefits and other options for treatment, the patient has consented to  Procedure(s): LEFT TOTAL HIP ARTHROPLASTY ANTERIOR APPROACH (Left) as a surgical intervention .  The patient's history has been reviewed, patient examined, no change in status, stable for surgery.  I have reviewed the patient's chart and labs.  Questions were answered to the patient's satisfaction.     Loanne Drilling

## 2013-05-31 NOTE — Progress Notes (Signed)
Utilization review completed.  

## 2013-05-31 NOTE — Op Note (Signed)
OPERATIVE REPORT  PREOPERATIVE DIAGNOSIS: Osteoarthritis of the Left hip.   POSTOPERATIVE DIAGNOSIS: Osteoarthritis of the Left  hip.   PROCEDURE: Left total hip arthroplasty, anterior approach.   SURGEON: Ollen Gross, MD   ASSISTANT: Avel Peace, PA-C  ANESTHESIA:  General  ESTIMATED BLOOD LOSS:- 700 ml   DRAINS: Hemovac x1.   COMPLICATIONS: None   CONDITION: PACU - hemodynamically stable.   BRIEF CLINICAL NOTE: Jay Webster is a 79 y.o. male who has advanced end-  stage arthritis of his Left  hip with progressively worsening pain and  dysfunction.The patient has failed nonoperative management and presents for  total hip arthroplasty.   PROCEDURE IN DETAIL: After successful administration of spinal  anesthetic, the traction boots for the [facility] bed were placed on both  feet and the patient was placed onto the [facility] bed, boots placed into the leg  holders. The Left hip was then isolated from the perineum with plastic  drapes and prepped and draped in the usual sterile fashion. ASIS and  greater trochanter were marked and a oblique incision was made, starting  at about 1 cm lateral and 2 cm distal to the ASIS and coursing towards  the anterior cortex of the femur. The skin was cut with a 10 blade  through subcutaneous tissue to the level of the fascia overlying the  tensor fascia lata muscle. The fascia was then incised in line with the  incision at the junction of the anterior third and posterior 2/3rd. The  muscle was teased off the fascia and then the interval between the TFL  and the rectus was developed. The Hohmann retractor was then placed at  the top of the femoral neck over the capsule. The vessels overlying the  capsule were cauterized and the fat on top of the capsule was removed.  A Hohmann retractor was then placed anterior underneath the rectus  femoris to give exposure to the entire anterior capsule. A T-shaped  capsulotomy was performed. The  edges were tagged and the femoral head  was identified.       Osteophytes are removed off the superior acetabulum.  The femoral neck was then cut in situ with an oscillating saw. Traction  was then applied to the left lower extremity utilizing the [facility]  traction. The femoral head was then removed. Retractors were placed  around the acetabulum and then circumferential removal of the labrum was  performed. Osteophytes were also removed. Reaming starts at 47 mm to  medialize and  Increased in 2 mm increments to 51 mm. We reamed in  approximately 40 degrees of abduction, 20 degrees anteversion. A 52 mm  pinnacle acetabular shell was then impacted in anatomic position under  fluoroscopic guidance with excellent purchase. We did not need to place  any additional dome screws. A 32 mm neutral + 4 marathon liner was then  placed into the acetabular shell.       The femoral lift was then placed along the lateral aspect of the femur  just distal to the vastus ridge. The leg was  externally rotated and capsule  was stripped off the inferior aspect of the femoral neck down to the  level of the lesser trochanter, this was done with electrocautery. The femur was lifted after this was performed. The  leg was then placed and extended in adducted position to essentially delivering the femur. We also removed the capsule superiorly and the  piriformis from  the piriformis fossa to gain excellent exposure of the  proximal femur. Rongeur was used to remove some cancellous bone to get  into the lateral portion of the proximal femur for placement of the  initial starter reamer. The starter broaches was placed  the starter broach  and was shown to go down the center of the canal. Broaching  with the  Corail system was then performed starting at size 8, coursing  Up to size 11. A size 11 had excellent torsional and rotational  and axial stability. The trial standard offset neck was then placed  with a 32 +1 trial  head. The hip was then reduced. We confirmed that  the stem was in the canal both on AP and lateral x-rays. It also has excellent sizing. The hip was reduced with outstanding stability through full extension, full external rotation,  and then flexion in adduction internal rotation. AP pelvis was taken  and the leg lengths were measured and found to be exactly equal. Hip  was then dislocated again and the femoral head and neck removed. The  femoral broach was removed. Size 11 Corail stem with a standard offset  neck was then impacted into the femur following native anteversion. Has  excellent purchase in the canal. Excellent torsional and rotational and  axial stability. It is confirmed to be in the canal on AP and lateral  fluoroscopic views. The 32 + 1 metal head was placed and the hip  reduced with outstanding stability. Again AP pelvis was taken and it  confirmed that the leg lengths were equal. The wound was then copiously  irrigated with saline solution and the capsule reattached and repaired  with Ethibond suture.  20 mL of Exparel mixed with 50 mL of saline then additional 20 ml of .25% Bupivicaine injected into the capsule and into the edge of the tensor fascia lata as well as subcutaneous tissue. The fascia overlying the tensor fascia lata was  then closed with a running #1 V-Loc. Subcu was closed with interrupted  2-0 Vicryl and subcuticular running 4-0 Monocryl. Incision was cleaned  and dried. Steri-Strips and a bulky sterile dressing applied. Hemovac  drain was hooked to suction and then he was awakened and transported to  recovery in stable condition.        Please note that a surgical assistant was a medical necessity for this procedure to perform it in a safe and expeditious manner. Assistant was necessary to provide appropriate retraction of vital neurovascular structures and to prevent femoral fracture and allow for anatomic placement of the prosthesis.  Ollen Gross, M.D.

## 2013-05-31 NOTE — Anesthesia Postprocedure Evaluation (Signed)
Anesthesia Post Note  Patient: Jay Webster  Procedure(s) Performed: Procedure(s) (LRB): LEFT TOTAL HIP ARTHROPLASTY ANTERIOR APPROACH (Left)  Anesthesia type: General  Patient location: PACU  Post pain: Pain level controlled  Post assessment: Post-op Vital signs reviewed  Last Vitals:  Filed Vitals:   05/31/13 1357  BP: 117/68  Pulse: 98  Temp: 36 C  Resp: 16    Post vital signs: Reviewed  Level of consciousness: sedated  Complications: No apparent anesthesia complications

## 2013-05-31 NOTE — Progress Notes (Signed)
X-ray results noted 

## 2013-05-31 NOTE — Transfer of Care (Signed)
Immediate Anesthesia Transfer of Care Note  Patient: Jay Webster  Procedure(s) Performed: Procedure(s): LEFT TOTAL HIP ARTHROPLASTY ANTERIOR APPROACH (Left)  Patient Location: PACU  Anesthesia Type:General  Level of Consciousness: awake, alert , sedated and patient cooperative  Airway & Oxygen Therapy: Patient Spontanous Breathing and Patient connected to face mask oxygen  Post-op Assessment: Report given to PACU RN and Post -op Vital signs reviewed and stable  Post vital signs: Reviewed and stable  Complications: No apparent anesthesia complications

## 2013-05-31 NOTE — Evaluation (Signed)
Physical Therapy Evaluation Patient Details Name: Jay Webster MRN: [id number] DOB: [birth year] Today's Date: 05/31/2013 Time: 4098-1191 PT Time Calculation (min): 30 min  PT Assessment / Plan / Recommendation History of Present Illness     Clinical Impression  Pt s/p L THR presents with decreased L LE strength/ROM and post op pain limiting functional mobility.  Pt should progress well to d/c home with family assist and HHPT follow up.    PT Assessment  Patient needs continued PT services    Follow Up Recommendations  Home health PT    Does the patient have the potential to tolerate intense rehabilitation      Barriers to Discharge        Equipment Recommendations  None recommended by PT    Recommendations for Other Services OT consult   Frequency 7X/week    Precautions / Restrictions Precautions Precautions: Fall Restrictions Weight Bearing Restrictions: No Other Position/Activity Restrictions: WBAT   Pertinent Vitals/Pain 3/10; premed, ice pack provided      Mobility  Bed Mobility Bed Mobility: Supine to Sit Supine to Sit: 4: Min assist;3: Mod assist Details for Bed Mobility Assistance: cues for sequence and use of R LE to self assist. Transfers Transfers: Sit to Stand;Stand to Sit Sit to Stand: 4: Min assist;3: Mod assist Stand to Sit: 4: Min assist Details for Transfer Assistance: cues for LE management and use of UEs to self assist Ambulation/Gait Ambulation/Gait Assistance: 4: Min assist Ambulation Distance (Feet): 55 Feet Assistive device: Rolling walker Ambulation/Gait Assistance Details: cues for initial sequence, posture, stride length and position from RW Gait Pattern: Decreased step length - right;Decreased step length - left;Step-to pattern;Trunk flexed;Shuffle;Step-through pattern    Exercises Total Joint Exercises Ankle Circles/Pumps: AROM;10 reps;Supine;Both Quad Sets: AROM;Both;10 reps;Supine Heel Slides: AAROM;15 reps;Supine;Left Hip  ABduction/ADduction: AAROM;10 reps;Left;Supine   PT Diagnosis: Difficulty walking  PT Problem List: Decreased strength;Decreased range of motion;Decreased activity tolerance;Decreased balance;Decreased mobility;Decreased knowledge of use of DME;Pain PT Treatment Interventions: DME instruction;Gait training;Stair training;Functional mobility training;Therapeutic activities;Therapeutic exercise;Patient/family education     PT Goals(Current goals can be found in the care plan section) Acute Rehab PT Goals Patient Stated Goal: Resume previous lifestyle with decreased pain PT Goal Formulation: With patient Time For Goal Achievement: 06/07/13 Potential to Achieve Goals: Good  Visit Information  Last PT Received On: 05/31/13 Assistance Needed: +1       Prior Functioning  Home Living Family/patient expects to be discharged to:: Private residence Living Arrangements: Spouse/significant other Available Help at Discharge: Family Type of Home: House Home Access: Stairs to enter Secretary/administrator of Steps: 1 Entrance Stairs-Rails: None Home Layout: One level Home Equipment: Environmental consultant - 2 wheels;Bedside commode Prior Function Level of Independence: Independent;Independent with assistive device(s) Communication Communication: No difficulties Dominant Hand: Right    Cognition  Cognition Arousal/Alertness: Awake/alert Behavior During Therapy: WFL for tasks assessed/performed Overall Cognitive Status: Within Functional Limits for tasks assessed    Extremity/Trunk Assessment Upper Extremity Assessment Upper Extremity Assessment: Overall WFL for tasks assessed Lower Extremity Assessment Lower Extremity Assessment: LLE deficits/detail LLE Deficits / Details: Hip strength 2+/5 with AAROM at hip to 100 flex and 20 abd   Balance    End of Session PT - End of Session Equipment Utilized During Treatment: Gait belt Activity Tolerance: Patient tolerated treatment well Patient left: in  chair;with call bell/phone within reach Nurse Communication: Mobility status  GP     Loriene Taunton 05/31/2013, 6:25 PM

## 2013-05-31 NOTE — Care Management Note (Addendum)
    Page 1 of 1   06/02/2013     10:44:06 AM   CARE MANAGEMENT NOTE 06/02/2013  Patient:  [name], Webster   Account Number:  [id number]  Date Initiated:  05/31/2013  Documentation initiated by:  Colleen Can  Subjective/Objective Assessment:   dx left hip replacemnt-anterior approach    Referral from doctor's office for [facility] to provide [facility] services after pt is discharged to home.     Action/Plan:   CM spoke with patient. Plans are for patient to return to his home in [place] where partner will be caregiver. He already has 3n1, RW and cane. Wants HH agency in network for hhpt services   Anticipated DC Date:  06/02/2013   Anticipated DC Plan:  HOME W HOME HEALTH SERVICES      DC Planning Services  CM consult      [facility] Choice  HOME HEALTH   Choice offered to / List presented to:  C-1 Patient        HH arranged  HH-2 PT      [facility] agency  [facility]   Status of service:  Completed, signed off Medicare Important Message given?  NA - LOS <3 / Initial given by admissions (If response is "NO", the following Medicare IM given date fields will be blank) Date Medicare IM given:   Date Additional Medicare IM given:    Discharge Disposition:  HOME W HOME HEALTH SERVICES  Per UR Regulation:    If discussed at Long Length of Stay Meetings, dates discussed:    Comments:  06/02/2013 Colleen Can BSN RN CCM [phone] Pt for discharge today with [facility] services to [facility] services tomorrow 06/03/2013.

## 2013-05-31 NOTE — H&P (View-Only) (Signed)
Jay Webster  DOB: 03/03/1932 Single / Language: English / Race: White Male  Date of Admission:  05/31/2013  Chief Complaint:  Left Hip Pain  History of Present Illness The patient is a 79 year old male who comes in  for a preoperative History and Physical. The patient is scheduled for a total hip arthroplasty (anterior approach) to be performed by Dr. Frank V. Aluisio, MD at [facility] on 05/31/2013. The patient is a 79 year old male who presents for follow up of their hip. The patient is being followed for their left hip pain and osteoarthritis. They are 1 year(s) (5 months IA with Dr. Ramos) out. Symptoms reported today include: pain. The patient feels that they are doing poorly (shot only lasted week so decided to watch it) and report their pain level to be moderate (good days bad days). The following medication has been used for pain control: none. The hip is getting progressively worse. This is starting to limit him more as to what he can and can not do. He said for the most part he can tolerate the pain but the function is getting intolerable. He is ready to proceed with surgery. They have been treated conservatively in the past for the above stated problem and despite conservative measures, they continue to have progressive pain and severe functional limitations and dysfunction. They have failed non-operative management including home exercise, medications, and injections. It is felt that they would benefit from undergoing total joint replacement. Risks and benefits of the procedure have been discussed with the patient and they elect to proceed with surgery. There are no active contraindications to surgery such as ongoing infection or rapidly progressive neurological disease.   Problem List Osteoarthrosis NOS, pelvis/thigh (715.95). 01/30/2011   Allergies PENICILLIN. 11/15/2007 Swelling. Palpitations   Family History Cancer. brother Diabetes Mellitus.  brother Osteoarthritis. mother   Social History Drug/Alcohol Rehab (Currently). no Exercise. Exercises rarely Living situation. live with partner Alcohol use. current drinker; drinks beer, wine and hard liquor; less than 5 per week Children. 0 Current work status. retired Previously in rehab. no Tobacco / smoke exposure. no Tobacco use. former smoker; smoke(d) 3 or more pack(s) per day Marital status. single Number of flights of stairs before winded. 4-5 Pain Contract. no Advance Directives. Living Will, Healthcare POA Post-Surgical Plans. Home   Medication History Allopurinol (300MG Tablet, Oral) Active. Benazepril HCl (20MG Tablet, Oral) Active. Levothyroxine Sodium (150MCG Tablet, Oral) Active.   Past Surgical History Thyroidectomy; Total. Date: 2010. Rotator Cuff Repair. right Other Surgery. 1st rib removed right side. It and the collarbone had closed off a blood vain to the right arm Appendectomy. Date: 1948. Kidney Stone Surgery. 1952, 1971 Elbow Radial Head Resection. Date: 1973.   Medical History Kidney Stone. 1986 Skin Cancer High blood pressure Hepatitis B Impaired Vision Impaired Hearing Macular Degeneration Cataract Diverticulosis Hypothyroidism Measles Mumps Rubella Thyroid Cancer. Surgical Removal - No treatment   Review of Systems General:Not Present- Chills, Fever, Night Sweats, Fatigue, Weight Gain, Weight Loss and Memory Loss. Skin:Not Present- Hives, Itching, Rash, Eczema and Lesions. HEENT:Present- Decreased Hearing. Not Present- Tinnitus, Headache, Double Vision, Visual Loss, Hearing Loss and Dentures. Respiratory:Not Present- Shortness of breath with exertion, Shortness of breath at rest, Allergies, Coughing up blood and Chronic Cough. Cardiovascular:Not Present- Chest Pain, Racing/skipping heartbeats, Difficulty Breathing Lying Down, Murmur, Swelling and Palpitations. Gastrointestinal:Not Present-  Bloody Stool, Heartburn, Abdominal Pain, Vomiting, Nausea, Constipation, Diarrhea, Difficulty Swallowing, Jaundice and Loss of appetitie. Male Genitourinary:Not Present-   Urinary frequency, Blood in Urine, Weak urinary stream, Discharge, Flank Pain, Incontinence, Painful Urination, Urgency, Urinary Retention and Urinating at Night. Musculoskeletal:Present- Morning Stiffness. Not Present- Muscle Weakness, Muscle Pain, Joint Swelling, Joint Pain, Back Pain and Spasms. Neurological:Not Present- Tremor, Dizziness, Blackout spells, Paralysis, Difficulty with balance and Weakness. Psychiatric:Not Present- Insomnia.   Vitals Pulse: 72 (Regular) Resp.: 14 (Unlabored) BP: 142/72 (Sitting, Left Arm, Standard)    Physical Exam The physical exam findings are as follows:   General Mental Status - Alert, cooperative and good historian. General Appearance- pleasant. Not in acute distress. Orientation- Oriented X3. Build & Nutrition- Well nourished and Well developed.   Head and Neck Head- normocephalic, atraumatic . Neck Global Assessment- supple. no bruit auscultated on the right and no bruit auscultated on the left.   Eye Vision- Wears corrective lenses. Pupil- Bilateral- Regular and Round. Motion- Bilateral- EOMI.   ENMT EarsNote: bilateral hearing aids partial lower bridge  Chest and Lung Exam Auscultation: Breath sounds:- clear at anterior chest wall and - clear at posterior chest wall. Adventitious sounds:- No Adventitious sounds.   Cardiovascular Auscultation:Rhythm- Regular rate and rhythm. Heart Sounds- S1 WNL and S2 WNL. Murmurs & Other Heart Sounds:Auscultation of the heart reveals - No Murmurs.   Abdomen Palpation/Percussion:Tenderness- Abdomen is non-tender to palpation. Rigidity (guarding)- Abdomen is soft. Auscultation:Auscultation of the abdomen reveals - Bowel sounds normal.   Male Genitourinary Not done, not  pertinent to present illness  Musculoskeletal He is alert and oriented. No apparent distress. The left hip can be flexed to 90 with no internal rotation. There is about 5 degrees of external rotation and 10 degrees of abduction. The right hip has a normal range of motion. He is not having any trochanteric tenderness at all. He does have pain on range of motion of that left hip. His knee examination is normal. Pulse, sensation and motor are intact.  RADIOGRAPHS: AP of the pelvis and lateral of the left hip show he is now completely bone on bone of the left hip with some small erosion in the superior femoral head. He also has a subchondral cystic formation in the femoral head and acetabulum. We compared these X-rays with those of a year and a half ago and he has had significant progression during that time span.  Assessment & Plan Osteoarthrosis NOS, pelvis/thigh (715.95) Impression: Left Hip  Note: Plan is for a Left Total Hip Replacement - Anterior Approach by Dr. Aluisio.  Plan is to go home.  PCP - Dr. John Jenkins - Patient has been seen preoperatively and felt to be stable for surgery.   The patient does not have any contraindications and will recieve TXA (tranexamic acid) prior to surgery.  Signed electronically by Dajsha Massaro L Dezra Mandella, III PA-C 

## 2013-05-31 NOTE — Progress Notes (Signed)
Portable AP Pelvis X-ray done. 

## 2013-06-01 ENCOUNTER — Encounter (HOSPITAL_COMMUNITY): Payer: Self-pay | Admitting: Orthopedic Surgery

## 2013-06-01 DIAGNOSIS — E871 Hypo-osmolality and hyponatremia: Secondary | ICD-10-CM

## 2013-06-01 LAB — CBC
HCT: 31.9 % — ABNORMAL LOW (ref 39.0–52.0)
Hemoglobin: 11 g/dL — ABNORMAL LOW (ref 13.0–17.0)
MCH: 30 pg (ref 26.0–34.0)
MCHC: 34.5 g/dL (ref 30.0–36.0)
MCV: 86.9 fL (ref 78.0–100.0)
RDW: 13.5 % (ref 11.5–15.5)
WBC: 10.3 10*3/uL (ref 4.0–10.5)

## 2013-06-01 LAB — BASIC METABOLIC PANEL
BUN: 13 mg/dL (ref 6–23)
Calcium: 8.5 mg/dL (ref 8.4–10.5)
Chloride: 104 mEq/L (ref 96–112)
GFR calc Af Amer: >90 mL/min (ref >90)
GFR calc non Af Amer: 89 mL/min — ABNORMAL LOW (ref >90)
Glucose, Bld: 131 mg/dL — ABNORMAL HIGH (ref 70–99)
Potassium: 4.2 mEq/L (ref 3.5–5.1)
Sodium: 134 mEq/L — ABNORMAL LOW (ref 135–145)

## 2013-06-01 MED ORDER — ALUM & MAG HYDROXIDE-SIMETH 200-200-20 MG/5ML PO SUSP
30.0000 mL | Freq: Four times a day (QID) | ORAL | Status: DC | PRN
  Administered 2013-06-01: 30 mL via ORAL
  Filled 2013-06-01: qty 30

## 2013-06-01 NOTE — Progress Notes (Signed)
Physical Therapy Treatment Patient Details Name: Doniven Vanpatten MRN: [id number] DOB: May 14, 1932 Today's Date: 06/01/2013 Time: 3086-5784 PT Time Calculation (min): 27 min  PT Assessment / Plan / Recommendation  History of Present Illness     PT Comments   Pt remains very motivated and progressing well.  Stairs and car transfers reviewed  Follow Up Recommendations  Home health PT     Does the patient have the potential to tolerate intense rehabilitation     Barriers to Discharge        Equipment Recommendations  None recommended by PT    Recommendations for Other Services OT consult  Frequency 7X/week   Progress towards PT Goals Progress towards PT goals: Progressing toward goals  Plan Current plan remains appropriate    Precautions / Restrictions Precautions Precautions: Fall Restrictions Weight Bearing Restrictions: No Other Position/Activity Restrictions: WBAT   Pertinent Vitals/Pain 1/10;    Mobility  Bed Mobility Bed Mobility: Supine to Sit Supine to Sit: 4: Min guard Details for Bed Mobility Assistance: cues for sequence and use of R LE to self assist. Transfers Transfers: Sit to Stand;Stand to Sit Sit to Stand: 4: Min guard Stand to Sit: 4: Min guard Details for Transfer Assistance: cues for LE management and use of UEs to self assist Ambulation/Gait Ambulation/Gait Assistance: 4: Min assist;4: Min guard Ambulation Distance (Feet): 400 Feet Assistive device: Rolling walker Ambulation/Gait Assistance Details: cues for position from RW and posture Gait Pattern: Step-to pattern;Step-through pattern;Antalgic;Trunk flexed Stairs: Yes Stairs Assistance: 4: Min assist Stairs Assistance Details (indicate cue type and reason): cues for sequence, foot/RW placement Stair Management Technique: No rails;Two rails Number of Stairs: 4 (2 x 1 step with RW fwd and 2 steps with bil rails)    Exercises     PT Diagnosis:    PT Problem List:   PT Treatment  Interventions:     PT Goals (current goals can now be found in the care plan section) Acute Rehab PT Goals Patient Stated Goal: Resume previous lifestyle with decreased pain PT Goal Formulation: With patient Time For Goal Achievement: 06/07/13 Potential to Achieve Goals: Good  Visit Information  Last PT Received On: 06/01/13 Assistance Needed: +1    Subjective Data  Patient Stated Goal: Resume previous lifestyle with decreased pain   Cognition  Cognition Arousal/Alertness: Awake/alert Behavior During Therapy: WFL for tasks assessed/performed Overall Cognitive Status: Within Functional Limits for tasks assessed    Balance     End of Session PT - End of Session Equipment Utilized During Treatment: Gait belt Activity Tolerance: Patient tolerated treatment well Patient left: with call bell/phone within reach;in chair Nurse Communication: Mobility status   GP     William Laske 06/01/2013, 2:26 PM

## 2013-06-01 NOTE — Progress Notes (Signed)
   Subjective: 1 Day Post-Op Procedure(s) (LRB): LEFT TOTAL HIP ARTHROPLASTY ANTERIOR APPROACH (Left) Patient reports pain as mild.   Patient seen in rounds with Dr. Lequita Halt. Patient is well, and has had no acute complaints or problems We will start therapy today.  Plan is to go Home after hospital stay.  Objective: Vital signs in last 24 hours: Temp:  [96.8 F (36 C)-97.8 F (36.6 C)] 97.7 F (36.5 C) (09/25 0309) Pulse Rate:  [79-124] 79 (09/25 0309) Resp:  [12-20] 16 (09/25 0440) BP: (108-173)/(65-87) 111/67 mmHg (09/25 0309) SpO2:  [97 %-100 %] 97 % (09/25 0309) Weight:  [86.637 kg (191 lb)] 86.637 kg (191 lb) (09/24 1110)  Intake/Output from previous day:  Intake/Output Summary (Last 24 hours) at 06/01/13 0823 Last data filed at 06/01/13 0500  Gross per 24 hour  Intake 3461.25 ml  Output   4155 ml  Net -693.75 ml    Intake/Output this shift:    Labs:  Recent Labs  06/01/13 0403  HGB 11.0*    Recent Labs  06/01/13 0403  WBC 10.3  RBC 3.67*  HCT 31.9*  PLT 189    Recent Labs  06/01/13 0403  NA 134*  K 4.2  CL 104  CO2 25  BUN 13  CREATININE 0.69  GLUCOSE 131*  CALCIUM 8.5   No results found for this basename: LABPT, INR,  in the last 72 hours  EXAM General - Patient is Alert, Appropriate and Oriented Extremity - Neurovascular intact Sensation intact distally Dorsiflexion/Plantar flexion intact Dressing - dressing C/D/I Motor Function - intact, moving foot and toes well on exam.  Hemovac pulled without difficulty.  Past Medical History  Diagnosis Date  . Hypertension   . Low back pain   . Thyroid disease   . RBBB (right bundle branch block with left anterior fascicular block)   . Uric acid renal calculus 06/20/2007    Qualifier: Diagnosis of  By: Claiborne Billings CMA, Terance Ice    . Hypothyroidism   . Seasonal allergies   . History of kidney stones   . History of shingles     RESOLVED  . Arthritis     OA AND PAIN LEFT HIP AND OTHER  JOINT PAINS  . Cancer     THYROID CANCER - HAD THYROID REMOVED  . Macular degeneration of both eyes     Assessment/Plan: 1 Day Post-Op Procedure(s) (LRB): LEFT TOTAL HIP ARTHROPLASTY ANTERIOR APPROACH (Left) Principal Problem:   OA (osteoarthritis) of hip Active Problems:   Hyponatremia  Estimated body mass index is 30.84 kg/(m^2) as calculated from the following:   Height as of this encounter: 5\' 6"  (1.676 m).   Weight as of this encounter: 86.637 kg (191 lb). Advance diet Up with therapy Plan for discharge tomorrow Discharge home with home health  DVT Prophylaxis - Xarelto Weight Bearing As Tolerated left Leg Hemovac Pulled Begin Therapy No vaccines.  Tinita Brooker 06/01/2013, 8:23 AM

## 2013-06-01 NOTE — Progress Notes (Signed)
Physical Therapy Treatment Patient Details Name: Jay Webster MRN: [id number] DOB: 01-Oct-1931 Today's Date: 06/01/2013 Time: 4098-1191 PT Time Calculation (min): 30 min  PT Assessment / Plan / Recommendation  History of Present Illness     PT Comments     Follow Up Recommendations  Home health PT     Does the patient have the potential to tolerate intense rehabilitation     Barriers to Discharge        Equipment Recommendations  None recommended by PT    Recommendations for Other Services OT consult  Frequency 7X/week   Progress towards PT Goals Progress towards PT goals: Progressing toward goals  Plan Current plan remains appropriate    Precautions / Restrictions Precautions Precautions: Fall Restrictions Weight Bearing Restrictions: No Other Position/Activity Restrictions: WBAT   Pertinent Vitals/Pain Min c/o pain; premedicated, ice pack provided    Mobility  Bed Mobility Bed Mobility: Sit to Supine Sit to Supine: 4: Min assist Details for Bed Mobility Assistance: cues for sequence and use of R LE to self assist. Transfers Transfers: Sit to Stand;Stand to Sit Sit to Stand: 4: Min assist Stand to Sit: 4: Min assist Details for Transfer Assistance: cues for LE management and use of UEs to self assist Ambulation/Gait Ambulation/Gait Assistance: 4: Min assist Ambulation Distance (Feet): 200 Feet Assistive device: Rolling walker Ambulation/Gait Assistance Details: cues for initial sequence, posture and position from RW Gait Pattern: Decreased step length - right;Decreased step length - left;Step-to pattern;Trunk flexed;Shuffle;Step-through pattern Stairs: No    Exercises Total Joint Exercises Ankle Circles/Pumps: AROM;Supine;Both;15 reps Quad Sets: AROM;Both;10 reps;Supine Heel Slides: AAROM;Supine;Left;20 reps Hip ABduction/ADduction: AAROM;Left;Supine;15 reps   PT Diagnosis:    PT Problem List:   PT Treatment Interventions:     PT Goals (current  goals can now be found in the care plan section) Acute Rehab PT Goals Patient Stated Goal: Resume previous lifestyle with decreased pain PT Goal Formulation: With patient Time For Goal Achievement: 06/07/13 Potential to Achieve Goals: Good  Visit Information  Last PT Received On: 06/01/13 Assistance Needed: +1    Subjective Data  Patient Stated Goal: Resume previous lifestyle with decreased pain   Cognition  Cognition Arousal/Alertness: Awake/alert Behavior During Therapy: WFL for tasks assessed/performed Overall Cognitive Status: Within Functional Limits for tasks assessed    Balance     End of Session PT - End of Session Equipment Utilized During Treatment: Gait belt Activity Tolerance: Patient tolerated treatment well Patient left: with call bell/phone within reach;in bed Nurse Communication: Mobility status   GP     Jay Webster 06/01/2013, 1:00 PM

## 2013-06-02 LAB — CBC
HCT: 30.7 % — ABNORMAL LOW (ref 39.0–52.0)
Hemoglobin: 10.8 g/dL — ABNORMAL LOW (ref 13.0–17.0)
MCH: 30.7 pg (ref 26.0–34.0)
MCHC: 35.2 g/dL (ref 30.0–36.0)
MCV: 87.2 fL (ref 78.0–100.0)
Platelets: 204 10*3/uL (ref 150–400)
RBC: 3.52 MIL/uL — ABNORMAL LOW (ref 4.22–5.81)

## 2013-06-02 LAB — BASIC METABOLIC PANEL
BUN: 14 mg/dL (ref 6–23)
CO2: 24 mEq/L (ref 19–32)
Calcium: 8.6 mg/dL (ref 8.4–10.5)
Creatinine, Ser: 0.69 mg/dL (ref 0.50–1.35)
GFR calc Af Amer: >90 mL/min (ref >90)
GFR calc non Af Amer: 89 mL/min — ABNORMAL LOW (ref >90)
Glucose, Bld: 141 mg/dL — ABNORMAL HIGH (ref 70–99)
Sodium: 137 mEq/L (ref 135–145)

## 2013-06-02 MED ORDER — TRAMADOL HCL 50 MG PO TABS: 50.0000 mg | ORAL_TABLET | Freq: Four times a day (QID) | ORAL | Status: DC | PRN

## 2013-06-02 MED ORDER — RIVAROXABAN 10 MG PO TABS: 10.0000 mg | ORAL_TABLET | Freq: Every day | ORAL | Status: DC

## 2013-06-02 MED ORDER — METHOCARBAMOL 500 MG PO TABS: 500.0000 mg | ORAL_TABLET | Freq: Four times a day (QID) | ORAL | Status: DC | PRN

## 2013-06-02 MED ORDER — OXYCODONE HCL 5 MG PO TABS: 5.0000 mg | ORAL_TABLET | ORAL | Status: DC | PRN

## 2013-06-02 NOTE — Progress Notes (Signed)
Physical Therapy Treatment Patient Details Name: Jay Webster MRN: [id number] DOB: Apr 17, 1932 Today's Date: 06/02/2013 Time: 4098-1191 PT Time Calculation (min): 41 min  PT Assessment / Plan / Recommendation  History of Present Illness Pt with L THR   PT Comments   Reviewed stairs and car transfers with pt.  Follow Up Recommendations  Home health PT     Does the patient have the potential to tolerate intense rehabilitation     Barriers to Discharge        Equipment Recommendations  None recommended by PT    Recommendations for Other Services OT consult  Frequency 7X/week   Progress towards PT Goals Progress towards PT goals: Progressing toward goals  Plan Current plan remains appropriate    Precautions / Restrictions Precautions Precautions: Fall Restrictions Weight Bearing Restrictions: No Other Position/Activity Restrictions: WBAT   Pertinent Vitals/Pain 2/10    Mobility  Bed Mobility Bed Mobility: Not assessed Supine to Sit: 5: Supervision Details for Bed Mobility Assistance: pt in recliner Transfers Transfers: Sit to Stand;Stand to Sit Sit to Stand: From chair/3-in-1;From toilet Stand to Sit: To toilet;To chair/3-in-1;5: Supervision Details for Transfer Assistance: cues for LE management and use of UEs to self assist Ambulation/Gait Ambulation/Gait Assistance: 4: Min guard;5: Supervision Ambulation Distance (Feet): 600 Feet Assistive device: Rolling walker Ambulation/Gait Assistance Details: cues for posture and position from RW Gait Pattern: Step-to pattern;Step-through pattern;Antalgic;Trunk flexed General Gait Details: Pt ambulating into bathroom, hallways Stairs: Yes Stairs Assistance: 4: Min assist Stair Management Technique: No rails;Forwards;With walker;Step to pattern Number of Stairs: 1    Exercises Total Joint Exercises Ankle Circles/Pumps: AROM;Supine;Both;15 reps Quad Sets: AROM;Both;10 reps;Supine Gluteal Sets: AROM;Both;10  reps;Supine Heel Slides: AAROM;Supine;Left;20 reps Hip ABduction/ADduction: AAROM;Left;Supine;20 reps Long Arc Quad: AROM;Both;15 reps;Seated   PT Diagnosis:    PT Problem List:   PT Treatment Interventions:     PT Goals (current goals can now be found in the care plan section) Acute Rehab PT Goals Patient Stated Goal: Resume previous lifestyle with decreased pain PT Goal Formulation: With patient Time For Goal Achievement: 06/07/13 Potential to Achieve Goals: Good  Visit Information  Last PT Received On: 06/02/13 Assistance Needed: +1 History of Present Illness: Pt with L THR    Subjective Data  Patient Stated Goal: Resume previous lifestyle with decreased pain   Cognition  Cognition Arousal/Alertness: Awake/alert Behavior During Therapy: WFL for tasks assessed/performed Overall Cognitive Status: Within Functional Limits for tasks assessed    Balance  Balance Balance Assessed: Yes Dynamic Standing Balance Dynamic Standing - Balance Support: During functional activity;No upper extremity supported Dynamic Standing - Level of Assistance: 5: Stand by assistance  End of Session PT - End of Session Equipment Utilized During Treatment: Gait belt Activity Tolerance: Patient tolerated treatment well Patient left: with call bell/phone within reach;in chair Nurse Communication: Mobility status   GP     Jay Webster 06/02/2013, 12:22 PM

## 2013-06-02 NOTE — Evaluation (Signed)
Occupational Therapy Evaluation Patient Details Name: Jay Webster MRN: [id number] DOB: [birth year] Today's Date: 06/02/2013 Time: 4696-2952 OT Time Calculation (min): 25 min  OT Assessment / Plan / Recommendation History of present illness Pt with L THR   Clinical Impression   Pt demos decline in function with LB ADLs and is at sup level with ADL mobility. Pt has A/E at home and will have necessary DME. All education completed and no further acute OT services indicated at this time    OT Assessment  Patient does not need any further OT services    Follow Up Recommendations  No OT follow up;Supervision - Intermittent    Barriers to Discharge  None    Equipment Recommendations  3 in 1 bedside comode    Recommendations for Other Services    Frequency       Precautions / Restrictions Precautions Precautions: Fall Restrictions Other Position/Activity Restrictions: WBAT   Pertinent Vitals/Pain 2/10    ADL  Grooming: Performed;Wash/dry hands;Wash/dry face;Supervision/safety;Min guard Where Assessed - Grooming: Supported standing Upper Body Bathing: Simulated;Set up;Supervision/safety Lower Body Bathing: Simulated;Moderate assistance Upper Body Dressing: Performed;Set up;Supervision/safety Lower Body Dressing: Performed;Moderate assistance Toilet Transfer: Research scientist (life sciences) Method: Sit to Barista: Raised toilet seat with arms (or 3-in-1 over toilet) Toileting - Clothing Manipulation and Hygiene: Performed;Min guard Where Assessed - Engineer, mining and Hygiene: Standing Tub/Shower Transfer Method: Not assessed Equipment Used: Long-handled shoe horn;Long-handled sponge;Reacher;Gait belt;Rolling walker ADL Comments: Pt familiar with A/E and has at home, reviewed use with pt    OT Diagnosis:    OT Problem List:   OT Treatment Interventions:     OT Goals(Current goals can be found in the care plan  section) Acute Rehab OT Goals Patient Stated Goal: Resume previous lifestyle with decreased pain  Visit Information  Last OT Received On: 06/02/13 Assistance Needed: +1 History of Present Illness: Pt with L THR       Prior Functioning     Home Living Family/patient expects to be discharged to:: Private residence Living Arrangements: Spouse/significant other Available Help at Discharge: Family Type of Home: House Home Access: Stairs to enter Secretary/administrator of Steps: 1 Entrance Stairs-Rails: None Home Layout: One level Home Equipment: Environmental consultant - 2 wheels;Bedside commode;Cane - single point;Adaptive equipment;Shower seat Adaptive Equipment: Reacher;Sock aid;Long-handled shoe horn;Long-handled sponge Additional Comments: has leg lifter Prior Function Level of Independence: Independent;Independent with assistive device(s) Communication Communication: No difficulties Dominant Hand: Right         Vision/Perception Vision - History Baseline Vision: Wears glasses all the time Patient Visual Report: No change from baseline Perception Perception: Within Functional Limits   Cognition  Cognition Arousal/Alertness: Awake/alert Behavior During Therapy: WFL for tasks assessed/performed Overall Cognitive Status: Within Functional Limits for tasks assessed    Extremity/Trunk Assessment Upper Extremity Assessment Upper Extremity Assessment: Overall WFL for tasks assessed Lower Extremity Assessment Lower Extremity Assessment: Defer to PT evaluation Cervical / Trunk Assessment Cervical / Trunk Assessment: Normal     Mobility Bed Mobility Bed Mobility: Not assessed Details for Bed Mobility Assistance: pt in recliner Transfers Transfers: Sit to Stand Sit to Stand: From chair/3-in-1;From toilet Stand to Sit: To toilet;To chair/3-in-1;5: Supervision     Exercise     Balance Balance Balance Assessed: Yes Dynamic Standing Balance Dynamic Standing - Balance Support:  During functional activity;No upper extremity supported Dynamic Standing - Level of Assistance: 5: Stand by assistance   End of Session OT - End of Session Equipment Utilized During  Treatment: Rolling walker;Other (comment) (3 in 1 over toilet, ADL A/E) Activity Tolerance: Patient tolerated treatment well Patient left: in chair;with call bell/phone within reach  GO     Galen Manila 06/02/2013, 11:55 AM

## 2013-06-02 NOTE — Discharge Summary (Signed)
Physician Discharge Summary   Patient ID: Jay Webster MRN: [id number] DOB/AGE: Jan 12, 1932 79 y.o.  Admit date: 05/31/2013 Discharge date: 06/02/2013  Primary Diagnosis:  Osteoarthritis of the Left hip.   Admission Diagnoses:  Past Medical History  Diagnosis Date  . Hypertension   . Low back pain   . Thyroid disease   . RBBB (right bundle branch block with left anterior fascicular block)   . Uric acid renal calculus 06/20/2007    Qualifier: Diagnosis of  By: Claiborne Billings CMA, Terance Ice    . Hypothyroidism   . Seasonal allergies   . History of kidney stones   . History of shingles     RESOLVED  . Arthritis     OA AND PAIN LEFT HIP AND OTHER JOINT PAINS  . Cancer     THYROID CANCER - HAD THYROID REMOVED  . Macular degeneration of both eyes    Discharge Diagnoses:   Principal Problem:   OA (osteoarthritis) of hip Active Problems:   Hyponatremia   Postoperative anemia due to acute blood loss  Estimated body mass index is 30.84 kg/(m^2) as calculated from the following:   Height as of this encounter: 5\' 6"  (1.676 m).   Weight as of this encounter: 86.637 kg (191 lb).  Procedure(s) (LRB): LEFT TOTAL HIP ARTHROPLASTY ANTERIOR APPROACH (Left)   Consults: None  HPI: Jay Webster is a 79 y.o. male who has advanced end-  stage arthritis of his Left hip with progressively worsening pain and  dysfunction.The patient has failed nonoperative management and presents for  total hip arthroplasty.   Laboratory Data: Admission on 05/31/2013, Discharged on 06/02/2013  Component Date Value Range Status  . ABO/RH(D) 05/31/2013 O POS   Final  . Antibody Screen 05/31/2013 NEG   Final  . Sample Expiration 05/31/2013 06/03/2013   Final  . ABO/RH(D) 05/31/2013 O POS   Final  . WBC 06/01/2013 10.3  4.0 - 10.5 K/uL Final  . RBC 06/01/2013 3.67* 4.22 - 5.81 MIL/uL Final  . Hemoglobin 06/01/2013 11.0* 13.0 - 17.0 g/dL Final  . HCT 30/86/5784 31.9* 39.0 - 52.0 % Final  . MCV  06/01/2013 86.9  78.0 - 100.0 fL Final  . MCH 06/01/2013 30.0  26.0 - 34.0 pg Final  . MCHC 06/01/2013 34.5  30.0 - 36.0 g/dL Final  . RDW 69/62/9528 13.5  11.5 - 15.5 % Final  . Platelets 06/01/2013 189  150 - 400 K/uL Final  . Sodium 06/01/2013 134* 135 - 145 mEq/L Final  . Potassium 06/01/2013 4.2  3.5 - 5.1 mEq/L Final  . Chloride 06/01/2013 104  96 - 112 mEq/L Final  . CO2 06/01/2013 25  19 - 32 mEq/L Final  . Glucose, Bld 06/01/2013 131* 70 - 99 mg/dL Final  . BUN 41/32/4401 13  6 - 23 mg/dL Final  . Creatinine, Ser 06/01/2013 0.69  0.50 - 1.35 mg/dL Final  . Calcium 02/72/5366 8.5  8.4 - 10.5 mg/dL Final  . GFR calc non Af Amer 06/01/2013 89* >90 mL/min Final  . GFR calc Af Amer 06/01/2013 >90  >90 mL/min Final   Comment: (NOTE)                          The eGFR has been calculated using the CKD EPI equation.                          This calculation  has not been validated in all clinical situations.                          eGFR's persistently <90 mL/min signify possible Chronic Kidney                          Disease.  . WBC 06/02/2013 10.7* 4.0 - 10.5 K/uL Final  . RBC 06/02/2013 3.52* 4.22 - 5.81 MIL/uL Final  . Hemoglobin 06/02/2013 10.8* 13.0 - 17.0 g/dL Final  . HCT 08/65/7846 30.7* 39.0 - 52.0 % Final  . MCV 06/02/2013 87.2  78.0 - 100.0 fL Final  . MCH 06/02/2013 30.7  26.0 - 34.0 pg Final  . MCHC 06/02/2013 35.2  30.0 - 36.0 g/dL Final  . RDW 96/29/5284 13.7  11.5 - 15.5 % Final  . Platelets 06/02/2013 204  150 - 400 K/uL Final  . Sodium 06/02/2013 137  135 - 145 mEq/L Final  . Potassium 06/02/2013 3.8  3.5 - 5.1 mEq/L Final  . Chloride 06/02/2013 104  96 - 112 mEq/L Final  . CO2 06/02/2013 24  19 - 32 mEq/L Final  . Glucose, Bld 06/02/2013 141* 70 - 99 mg/dL Final  . BUN 13/24/4010 14  6 - 23 mg/dL Final  . Creatinine, Ser 06/02/2013 0.69  0.50 - 1.35 mg/dL Final  . Calcium 27/25/3664 8.6  8.4 - 10.5 mg/dL Final  . GFR calc non Af Amer 06/02/2013 89* >90 mL/min  Final  . GFR calc Af Amer 06/02/2013 >90  >90 mL/min Final   Comment: (NOTE)                          The eGFR has been calculated using the CKD EPI equation.                          This calculation has not been validated in all clinical situations.                          eGFR's persistently <90 mL/min signify possible Chronic Kidney                          Disease.  Hospital Outpatient Visit on 05/16/2013  Component Date Value Range Status  . MRSA, PCR 05/16/2013 NEGATIVE  NEGATIVE Final  . Staphylococcus aureus 05/16/2013 POSITIVE* NEGATIVE Final   Comment:                                 The Xpert SA Assay (FDA                          approved for NASAL specimens                          in patients over 81 years of age),                          is one component of                          a comprehensive surveillance  program.  Test performance has                          been validated by [facility] for patients greater                          than or equal to 86 year old.                          It is not intended                          to diagnose infection nor to                          guide or monitor treatment.  Marland Kitchen aPTT 05/16/2013 42* 24 - 37 seconds Final   Comment:                                 IF BASELINE aPTT IS ELEVATED,                          SUGGEST PATIENT RISK ASSESSMENT                          BE USED TO DETERMINE APPROPRIATE                          ANTICOAGULANT THERAPY.  . WBC 05/16/2013 4.5  4.0 - 10.5 K/uL Final  . RBC 05/16/2013 4.60  4.22 - 5.81 MIL/uL Final  . Hemoglobin 05/16/2013 14.1  13.0 - 17.0 g/dL Final  . HCT 95/62/1308 40.3  39.0 - 52.0 % Final  . MCV 05/16/2013 87.6  78.0 - 100.0 fL Final  . MCH 05/16/2013 30.7  26.0 - 34.0 pg Final  . MCHC 05/16/2013 35.0  30.0 - 36.0 g/dL Final  . RDW 65/78/4696 13.2  11.5 - 15.5 % Final  . Platelets 05/16/2013 222  150 - 400 K/uL Final    . Sodium 05/16/2013 136  135 - 145 mEq/L Final  . Potassium 05/16/2013 4.5  3.5 - 5.1 mEq/L Final  . Chloride 05/16/2013 103  96 - 112 mEq/L Final  . CO2 05/16/2013 27  19 - 32 mEq/L Final  . Glucose, Bld 05/16/2013 104* 70 - 99 mg/dL Final  . BUN 29/52/8413 17  6 - 23 mg/dL Final  . Creatinine, Ser 05/16/2013 0.71  0.50 - 1.35 mg/dL Final  . Calcium 24/40/1027 9.4  8.4 - 10.5 mg/dL Final  . Total Protein 05/16/2013 7.2  6.0 - 8.3 g/dL Final  . Albumin 25/36/6440 3.3* 3.5 - 5.2 g/dL Final  . AST 34/74/2595 21  0 - 37 U/L Final  . ALT 05/16/2013 24  0 - 53 U/L Final  . Alkaline Phosphatase 05/16/2013 74  39 - 117 U/L Final  . Total Bilirubin 05/16/2013 0.3  0.3 - 1.2 mg/dL Final  . GFR calc non Af Amer 05/16/2013 88* >90 mL/min Final  . GFR calc Af Amer 05/16/2013 >90  >90 mL/min  Final   Comment: (NOTE)                          The eGFR has been calculated using the CKD EPI equation.                          This calculation has not been validated in all clinical situations.                          eGFR's persistently <90 mL/min signify possible Chronic Kidney                          Disease.  Marland Kitchen Prothrombin Time 05/16/2013 13.3  11.6 - 15.2 seconds Final  . INR 05/16/2013 1.03  0.00 - 1.49 Final  . Color, Urine 05/16/2013 YELLOW  YELLOW Final  . APPearance 05/16/2013 CLEAR  CLEAR Final  . Specific Gravity, Urine 05/16/2013 1.013  1.005 - 1.030 Final  . pH 05/16/2013 6.0  5.0 - 8.0 Final  . Glucose, UA 05/16/2013 NEGATIVE  NEGATIVE mg/dL Final  . Hgb urine dipstick 05/16/2013 NEGATIVE  NEGATIVE Final  . Bilirubin Urine 05/16/2013 NEGATIVE  NEGATIVE Final  . Ketones, ur 05/16/2013 NEGATIVE  NEGATIVE mg/dL Final  . Protein, ur 16/06/9603 NEGATIVE  NEGATIVE mg/dL Final  . Urobilinogen, UA 05/16/2013 0.2  0.0 - 1.0 mg/dL Final  . Nitrite 54/05/8118 NEGATIVE  NEGATIVE Final  . Leukocytes, UA 05/16/2013 NEGATIVE  NEGATIVE Final   MICROSCOPIC NOT DONE ON URINES WITH NEGATIVE PROTEIN,  BLOOD, LEUKOCYTES, NITRITE, OR GLUCOSE <1000 mg/dL.     X-Rays:Dg Chest 2 View  05/16/2013   *RADIOLOGY REPORT*  Clinical Data: Preoperative evaluation for left hip replacement, history hypertension, smoking, thyroid cancer  CHEST - 2 VIEW  Comparison: 10/12/2008  Findings: Normal heart size, mediastinal contours, and pulmonary vascularity. Bronchitic changes without infiltrate, pleural effusion or pneumothorax. Minimal scarring lingula. Surgical clips in the cervical region from prior thyroidectomy. Vascular stent at the right subclavian vessels. End plate spur formation thoracic spine.  IMPRESSION: Mild bronchitic changes with minimal lingular scarring. No acute abnormalities.   Original Report Authenticated By: Ulyses Southward, M.D.   Dg Hip Complete Left  05/16/2013   *RADIOLOGY REPORT*  Clinical Data: Preoperative evaluation for left total hip replacement  LEFT HIP - COMPLETE 2+ VIEW  Comparison: 01/03/2010  Findings: Osseous demineralization. Symmetric SI joints. Right hip joint space preserved. Diffuse narrowing of left hip joint with minimal subchondral sclerosis and cystic change compatible with osteoarthritic changes, progressive since 2011. No acute fracture, dislocation or bone destruction. Small left pelvic phleboliths noted. Soft tissues otherwise unremarkable.  IMPRESSION: Progressive osteoarthritic changes of the left hip since 2011.   Original Report Authenticated By: Ulyses Southward, M.D.   Dg Pelvis Portable  05/31/2013   CLINICAL DATA:  Postop left total hip replacement.  EXAM: PORTABLE PELVIS  COMPARISON:  05/16/2013.  FINDINGS: On this single projection, left total hip prosthesis appears in satisfactory position without complication noted. Surgical drain is in place.  Mild right hip joint degenerative changes.  IMPRESSION: On this single projection, left total hip prosthesis appears in satisfactory position without complication noted.   Electronically Signed   By: Bridgett Larsson   On: 05/31/2013  10:25   Dg C-arm 1-60 Min-no Report  05/31/2013   CLINICAL DATA: left anterior hip   C-ARM 1-60 MINUTES  Fluoroscopy was utilized by the requesting physician.  No radiographic  interpretation.     EKG: Orders placed during the hospital encounter of 05/31/13  . EKG     Hospital Course: Patient was admitted to [facility] and taken to the OR and underwent the above state procedure without complications.  Patient tolerated the procedure well and was later transferred to the recovery room and then to the orthopaedic floor for postoperative care.  They were given PO and IV analgesics for pain control following their surgery.  They were given 24 hours of postoperative antibiotics of  Anti-infectives   Start     Dose/Rate Route Frequency Ordered Stop   05/31/13 1800  vancomycin (VANCOCIN) IVPB 1000 mg/200 mL premix     1,000 mg 200 mL/hr over 60 Minutes Intravenous Every 12 hours 05/31/13 1132 05/31/13 1854   05/31/13 0521  vancomycin (VANCOCIN) 1,500 mg in sodium chloride 0.9 % 500 mL IVPB     1,500 mg 250 mL/hr over 120 Minutes Intravenous On call to O.R. 05/31/13 1610 05/31/13 0701     and started on DVT prophylaxis in the form of Xarelto.   PT and OT were ordered for total hip protocol.  The patient was allowed to be WBAT with therapy. Discharge planning was consulted to help with postop disposition and equipment needs.  Patient had a good night on the evening of surgery.  They started to get up OOB with therapy on day one.  Hemovac drain was pulled without difficulty.  The knee immobilizer was removed and discontinued.  Continued to work with therapy into day two.  Dressing was changed on day two and the incision was healing well.  Patient was seen in rounds and was ready to go home later that day.    Discharge Medications: Prior to Admission medications   Medication Sig Start Date End Date Taking? Authorizing Provider  allopurinol (ZYLOPRIM) 300 MG tablet Take 300 mg by mouth at  bedtime.   Yes Historical Provider, MD  benazepril (LOTENSIN) 20 MG tablet Take 20 mg by mouth daily before breakfast.   Yes Historical Provider, MD  levothyroxine (SYNTHROID, LEVOTHROID) 150 MCG tablet Take 150 mcg by mouth daily before breakfast.   Yes Historical Provider, MD  loratadine (ALAVERT) 10 MG tablet Take 10 mg by mouth daily as needed for allergies.   Yes Historical Provider, MD  Multiple Vitamins-Minerals (OCUVITE ADULT 50+) CAPS Take 1 capsule by mouth daily.   Yes Historical Provider, MD  methocarbamol (ROBAXIN) 500 MG tablet Take 1 tablet (500 mg total) by mouth every 6 (six) hours as needed (spasms). 06/02/13   Jeily Guthridge, PA-C  oxyCODONE (OXY IR/ROXICODONE) 5 MG immediate release tablet Take 1-2 tablets (5-10 mg total) by mouth every 3 (three) hours as needed for pain. 06/02/13   Yohana Bartha Julien Girt, PA-C  rivaroxaban (XARELTO) 10 MG TABS tablet Take 1 tablet (10 mg total) by mouth daily with breakfast. Take Xarelto for two and a half more weeks, then discontinue Xarelto. Once the patient has completed the Xarelto, they may resume the 81 mg Aspirin. 06/02/13   Shanty Ginty, PA-C  traMADol (ULTRAM) 50 MG tablet Take 1-2 tablets (50-100 mg total) by mouth every 6 (six) hours as needed (mild pain). 06/02/13   Deanglo Hissong Julien Girt, PA-C   Discharge home with home health  Diet - Cardiac diet  Follow up - in 2 weeks  Activity - WBAT  Disposition - Home  Condition Upon Discharge - Good  D/C Meds -  See DC Summary  DVT Prophylaxis - Xarelto       Discharge Orders   Future Appointments Provider Department Dept Phone   07/12/2013 9:00 AM Stacie Glaze, MD [facility] [phone]   Future Orders Complete By Expires   Call MD / Call 911  As directed    Comments:     If you experience chest pain or shortness of breath, CALL 911 and be transported to the hospital emergency room.  If you develope a fever above 101 F, pus (white drainage) or increased  drainage or redness at the wound, or calf pain, call your surgeon's office.   Change dressing  As directed    Comments:     You may change your dressing dressing daily with sterile 4 x 4 inch gauze dressing and paper tape.  Do not submerge the incision under water.   Constipation Prevention  As directed    Comments:     Drink plenty of fluids.  Prune juice may be helpful.  You may use a stool softener, such as Colace (over the counter) 100 mg twice a day.  Use MiraLax (over the counter) for constipation as needed.   Diet - low sodium heart healthy  As directed    Discharge instructions  As directed    Comments:     Pick up stool softner and laxative for home. Do not submerge incision under water. May shower. Continue to use ice for pain and swelling from surgery.  Take Xarelto for two and a half more weeks, then discontinue Xarelto. Once the patient has completed the Xarelto, they may resume the 81 mg Aspirin.   Do not sit on low chairs, stoools or toilet seats, as it may be difficult to get up from low surfaces  As directed    Driving restrictions  As directed    Comments:     No driving until released by the physician.   Increase activity slowly as tolerated  As directed    Lifting restrictions  As directed    Comments:     No lifting until released by the physician.   Patient may shower  As directed    Comments:     You may shower without a dressing once there is no drainage.  Do not wash over the wound.  If drainage remains, do not shower until drainage stops.   TED hose  As directed    Comments:     Use stockings (TED hose) for 3 weeks on both leg(s).  You may remove them at night for sleeping.   Weight bearing as tolerated  As directed        Medication List    STOP taking these medications       aspirin 81 MG tablet     fish oil-omega-3 fatty acids 1000 MG capsule     multivitamin with minerals Tabs tablet     NON FORMULARY      TAKE these medications        ALAVERT 10 MG tablet  Generic drug:  loratadine  Take 10 mg by mouth daily as needed for allergies.     allopurinol 300 MG tablet  Commonly known as:  ZYLOPRIM  Take 300 mg by mouth at bedtime.     benazepril 20 MG tablet  Commonly known as:  LOTENSIN  Take 20 mg by mouth daily before breakfast.     levothyroxine 150 MCG tablet  Commonly known as:  SYNTHROID, LEVOTHROID  Take 150 mcg by mouth daily before breakfast.     methocarbamol 500 MG tablet  Commonly known as:  ROBAXIN  Take 1 tablet (500 mg total) by mouth every 6 (six) hours as needed (spasms).     OCUVITE ADULT 50+ Caps  Take 1 capsule by mouth daily.     oxyCODONE 5 MG immediate release tablet  Commonly known as:  Oxy IR/ROXICODONE  Take 1-2 tablets (5-10 mg total) by mouth every 3 (three) hours as needed for pain.     rivaroxaban 10 MG Tabs tablet  Commonly known as:  XARELTO  - Take 1 tablet (10 mg total) by mouth daily with breakfast. Take Xarelto for two and a half more weeks, then discontinue Xarelto.  - Once the patient has completed the Xarelto, they may resume the 81 mg Aspirin.     traMADol 50 MG tablet  Commonly known as:  ULTRAM  Take 1-2 tablets (50-100 mg total) by mouth every 6 (six) hours as needed (mild pain).       Follow-up Information   Follow up with Loanne Drilling, MD. Schedule an appointment as soon as possible for a visit in 2 weeks.   Specialty:  Orthopedic Surgery   Contact information:   [address] [place] [address] [phone]       Signed: Patrica Duel 06/08/2013, 10:11 AM

## 2013-06-02 NOTE — Progress Notes (Signed)
   Subjective: 2 Days Post-Op Procedure(s) (LRB): LEFT TOTAL HIP ARTHROPLASTY ANTERIOR APPROACH (Left) Patient reports pain as mild.   Patient seen in rounds for Dr. Lequita Halt. Patient is well, and has had no acute complaints or problems Patient is ready to go home  Objective: Vital signs in last 24 hours: Temp:  [97 F (36.1 C)-98.4 F (36.9 C)] 97.5 F (36.4 C) (09/26 0550) Pulse Rate:  [72-80] 77 (09/26 0550) Resp:  [16-17] 16 (09/26 0550) BP: (92-144)/(48-80) 134/77 mmHg (09/26 0550) SpO2:  [96 %-98 %] 98 % (09/26 0550)  Intake/Output from previous day:  Intake/Output Summary (Last 24 hours) at 06/02/13 1610 Last data filed at 06/02/13 0348  Gross per 24 hour  Intake 641.25 ml  Output   3205 ml  Net -2563.75 ml    Labs:  Recent Labs  06/01/13 0403 06/02/13 0430  HGB 11.0* 10.8*    Recent Labs  06/01/13 0403 06/02/13 0430  WBC 10.3 10.7*  RBC 3.67* 3.52*  HCT 31.9* 30.7*  PLT 189 204    Recent Labs  06/01/13 0403 06/02/13 0430  NA 134* 137  K 4.2 3.8  CL 104 104  CO2 25 24  BUN 13 14  CREATININE 0.69 0.69  GLUCOSE 131* 141*  CALCIUM 8.5 8.6   No results found for this basename: LABPT, INR,  in the last 72 hours  EXAM: General - Patient is Alert, Appropriate and Oriented Extremity - Neurovascular intact Sensation intact distally Dorsiflexion/Plantar flexion intact No cellulitis present Incision - clean, dry, healing, scant serous from previous drain site  Motor Function - intact, moving foot and toes well on exam.   Assessment/Plan: 2 Days Post-Op Procedure(s) (LRB): LEFT TOTAL HIP ARTHROPLASTY ANTERIOR APPROACH (Left) Procedure(s) (LRB): LEFT TOTAL HIP ARTHROPLASTY ANTERIOR APPROACH (Left) Past Medical History  Diagnosis Date  . Hypertension   . Low back pain   . Thyroid disease   . RBBB (right bundle branch block with left anterior fascicular block)   . Uric acid renal calculus 06/20/2007    Qualifier: Diagnosis of  By: Claiborne Billings  CMA, Terance Ice    . Hypothyroidism   . Seasonal allergies   . History of kidney stones   . History of shingles     RESOLVED  . Arthritis     OA AND PAIN LEFT HIP AND OTHER JOINT PAINS  . Cancer     THYROID CANCER - HAD THYROID REMOVED  . Macular degeneration of both eyes    Principal Problem:   OA (osteoarthritis) of hip Active Problems:   Hyponatremia  Estimated body mass index is 30.84 kg/(m^2) as calculated from the following:   Height as of this encounter: 5\' 6"  (1.676 m).   Weight as of this encounter: 86.637 kg (191 lb). Discharge home with home health Diet - Cardiac diet Follow up - in 2 weeks Activity - WBAT Disposition - Home Condition Upon Discharge - Good D/C Meds - See DC Summary DVT Prophylaxis - Xarelto  PERKINS, ALEXZANDREW 06/02/2013, 8:22 AM

## 2013-06-02 NOTE — Plan of Care (Signed)
Problem: Consults Goal: Diagnosis- Total Joint Replacement Outcome: Completed/Met Date Met:  06/02/13 Primary Total Hip Left, Anterior

## 2013-06-08 DIAGNOSIS — D62 Acute posthemorrhagic anemia: Secondary | ICD-10-CM

## 2013-07-12 ENCOUNTER — Ambulatory Visit: Payer: Medicare Other | Admitting: Internal Medicine

## 2013-07-21 ENCOUNTER — Encounter: Payer: Self-pay | Admitting: Internal Medicine

## 2013-07-21 ENCOUNTER — Ambulatory Visit (INDEPENDENT_AMBULATORY_CARE_PROVIDER_SITE_OTHER): Payer: Medicare Other | Admitting: Internal Medicine

## 2013-07-21 VITALS — BP 124/66 | HR 76 | Temp 98.6°F | Ht 66.0 in | Wt 194.0 lb

## 2013-07-21 DIAGNOSIS — H16299 Other keratoconjunctivitis, unspecified eye: Secondary | ICD-10-CM

## 2013-07-21 DIAGNOSIS — L718 Other rosacea: Secondary | ICD-10-CM

## 2013-07-21 DIAGNOSIS — D500 Iron deficiency anemia secondary to blood loss (chronic): Secondary | ICD-10-CM

## 2013-07-21 DIAGNOSIS — H35349 Macular cyst, hole, or pseudohole, unspecified eye: Secondary | ICD-10-CM

## 2013-07-21 DIAGNOSIS — H35343 Macular cyst, hole, or pseudohole, bilateral: Secondary | ICD-10-CM

## 2013-07-21 DIAGNOSIS — L719 Rosacea, unspecified: Secondary | ICD-10-CM

## 2013-07-21 LAB — CBC WITH DIFFERENTIAL/PLATELET
Basophils Absolute: 0 10*3/uL (ref 0.0–0.1)
Basophils Relative: 0.7 % (ref 0.0–3.0)
Eosinophils Absolute: 0.1 10*3/uL (ref 0.0–0.7)
HCT: 38.8 % — ABNORMAL LOW (ref 39.0–52.0)
Lymphocytes Relative: 32.3 % (ref 12.0–46.0)
MCHC: 33.8 g/dL (ref 30.0–36.0)
MCV: 89.1 fl (ref 78.0–100.0)
Monocytes Absolute: 0.7 10*3/uL (ref 0.1–1.0)
Neutrophils Relative %: 52.5 % (ref 43.0–77.0)
Platelets: 273 10*3/uL (ref 150.0–400.0)
RBC: 4.36 Mil/uL (ref 4.22–5.81)
RDW: 14.3 % (ref 11.5–14.6)

## 2013-07-21 NOTE — Progress Notes (Signed)
Subjective:    Patient ID: Jay Webster, male    DOB: 09-12-1931, 79 y.o.   MRN: [id number]  HPI Post surgery left knee with dr Berton Lan and is in PT Had post operative constipation adn mild post operative anemia   Review of Systems  Constitutional: Positive for fatigue. Negative for fever.  HENT: Negative for congestion, hearing loss and postnasal drip.   Eyes: Negative for discharge, redness and visual disturbance.  Respiratory: Negative for cough, shortness of breath and wheezing.   Cardiovascular: Positive for leg swelling.  Gastrointestinal: Negative for abdominal pain, constipation and abdominal distention.  Genitourinary: Positive for frequency. Negative for urgency.  Musculoskeletal: Positive for gait problem and joint swelling. Negative for arthralgias and neck pain.  Skin: Positive for color change and pallor. Negative for rash.  Neurological: Negative for weakness and light-headedness.  Hematological: Negative for adenopathy.  Psychiatric/Behavioral: Negative for behavioral problems.   Past Medical History  Diagnosis Date  . Hypertension   . Low back pain   . Thyroid disease   . RBBB (right bundle branch block with left anterior fascicular block)   . Uric acid renal calculus 06/20/2007    Qualifier: Diagnosis of  By: Claiborne Billings CMA, Terance Ice    . Hypothyroidism   . Seasonal allergies   . History of kidney stones   . History of shingles     RESOLVED  . Arthritis     OA AND PAIN LEFT HIP AND OTHER JOINT PAINS  . Cancer     THYROID CANCER - HAD THYROID REMOVED  . Macular degeneration of both eyes     History   Social History  . Marital Status: Significant Other    Spouse Name: N/A    Number of Children: N/A  . Years of Education: N/A   Occupational History  . retired    Social History Main Topics  . Smoking status: Former Games developer  . Smokeless tobacco: Not on file  . Alcohol Use: No  . Drug Use: No  . Sexual Activity: Yes   Other Topics Concern   . Not on file   Social History Narrative  . No narrative on file    Past Surgical History  Procedure Laterality Date  . Colonoscopy    . Appendectomy    . Rotator cuff repair    . Thyroidectomy  2010  . Removal of first rib   NOV 1994    FOR COMPRESSED VEIN BETWEEN 1 ST RIB AND COLLARBONE  . Total hip arthroplasty Left 05/31/2013    Procedure: LEFT TOTAL HIP ARTHROPLASTY ANTERIOR APPROACH;  Surgeon: Loanne Drilling, MD;  Location: [facility];  Service: Orthopedics;  Laterality: Left;    Family History  Problem Relation Age of Onset  . Anemia    . Thyroid disease    . Heart disease Mother   . Diabetes Mother     Allergies  Allergen Reactions  . Penicillins Swelling and Palpitations    Swelling of joints.    Current Outpatient Prescriptions on File Prior to Visit  Medication Sig Dispense Refill  . allopurinol (ZYLOPRIM) 300 MG tablet Take 300 mg by mouth at bedtime.      . benazepril (LOTENSIN) 20 MG tablet Take 20 mg by mouth daily before breakfast.      . levothyroxine (SYNTHROID, LEVOTHROID) 150 MCG tablet Take 150 mcg by mouth daily before breakfast.      . loratadine (ALAVERT) 10 MG tablet Take 10 mg by mouth daily as needed for  allergies.      . Multiple Vitamins-Minerals (OCUVITE ADULT 50+) CAPS Take 1 capsule by mouth daily.       No current facility-administered medications on file prior to visit.    BP 124/66  Pulse 76  Temp(Src) 98.6 F (37 C) (Oral)  Ht 5\' 6"  (1.676 m)  Wt 194 lb (87.998 kg)  BMI 31.33 kg/m2       Objective:   Physical Exam  Nursing note and vitals reviewed. Constitutional: He is oriented to person, place, and time. He appears well-developed.  HENT:  Head: Normocephalic and atraumatic.  Neck: Normal range of motion. Neck supple.  Cardiovascular: Normal rate.   Murmur heard. Pulmonary/Chest: Effort normal and breath sounds normal.  Musculoskeletal: He exhibits edema and tenderness.  Neurological: He is alert and oriented to  person, place, and time.  Skin: Skin is dry.  Psychiatric: He has a normal mood and affect. His behavior is normal.          Assessment & Plan:  Monitor for resolution of the post operative anemia and iron deficiency rosacea on fore head Trial of mirvason to face trail Has right and left eye macular degeneration

## 2013-07-21 NOTE — Patient Instructions (Signed)
The patient is instructed to continue all medications as prescribed. Schedule followup with check out clerk upon leaving the clinic  

## 2013-07-21 NOTE — Progress Notes (Signed)
Pre visit review using our clinic review tool, if applicable. No additional management support is needed unless otherwise documented below in the visit note. 

## 2014-01-12 ENCOUNTER — Encounter: Payer: Self-pay | Admitting: Internal Medicine

## 2014-01-15 ENCOUNTER — Other Ambulatory Visit (INDEPENDENT_AMBULATORY_CARE_PROVIDER_SITE_OTHER): Payer: Medicare Other

## 2014-01-15 DIAGNOSIS — Z0000 Encounter for general adult medical examination without abnormal findings: Secondary | ICD-10-CM

## 2014-01-15 LAB — PSA: PSA: 0.99 ng/mL (ref 0.10–4.00)

## 2014-01-15 LAB — CBC WITH DIFFERENTIAL/PLATELET
BASOS PCT: 0.9 % (ref 0.0–3.0)
Basophils Absolute: 0 10*3/uL (ref 0.0–0.1)
Eosinophils Absolute: 0.2 10*3/uL (ref 0.0–0.7)
Eosinophils Relative: 2.9 % (ref 0.0–5.0)
HCT: 42 % (ref 39.0–52.0)
Hemoglobin: 14.4 g/dL (ref 13.0–17.0)
LYMPHS ABS: 2.1 10*3/uL (ref 0.7–4.0)
Lymphocytes Relative: 37.9 % (ref 12.0–46.0)
MCHC: 34.2 g/dL (ref 30.0–36.0)
MCV: 90.6 fl (ref 78.0–100.0)
MONO ABS: 0.6 10*3/uL (ref 0.1–1.0)
Monocytes Relative: 10.2 % (ref 3.0–12.0)
Neutro Abs: 2.6 10*3/uL (ref 1.4–7.7)
Neutrophils Relative %: 48.1 % (ref 43.0–77.0)
Platelets: 249 10*3/uL (ref 150.0–400.0)
RBC: 4.64 Mil/uL (ref 4.22–5.81)
RDW: 14.2 % (ref 11.5–15.5)
WBC: 5.5 10*3/uL (ref 4.0–10.5)

## 2014-01-15 LAB — LIPID PANEL
Cholesterol: 133 mg/dL (ref 0–200)
HDL: 55.9 mg/dL (ref >39.00)
LDL Cholesterol: 72 mg/dL (ref 0–99)
Total CHOL/HDL Ratio: 2
Triglycerides: 24 mg/dL (ref 0.0–149.0)
VLDL: 4.8 mg/dL (ref 0.0–40.0)

## 2014-01-15 LAB — POCT URINALYSIS DIPSTICK
Bilirubin, UA: NEGATIVE
Blood, UA: NEGATIVE
Glucose, UA: NEGATIVE
Ketones, UA: NEGATIVE
NITRITE UA: NEGATIVE
PH UA: 5.5
PROTEIN UA: NEGATIVE
Spec Grav, UA: 1.015
UROBILINOGEN UA: 0.2

## 2014-01-15 LAB — BASIC METABOLIC PANEL
BUN: 16 mg/dL (ref 6–23)
CHLORIDE: 104 meq/L (ref 96–112)
CO2: 25 meq/L (ref 19–32)
Calcium: 8.7 mg/dL (ref 8.4–10.5)
Creatinine, Ser: 0.8 mg/dL (ref 0.4–1.5)
GFR: 103.42 mL/min (ref >60.00)
Glucose, Bld: 94 mg/dL (ref 70–99)
Potassium: 4.2 mEq/L (ref 3.5–5.1)
Sodium: 137 mEq/L (ref 135–145)

## 2014-01-15 LAB — HEPATIC FUNCTION PANEL
ALBUMIN: 3.6 g/dL (ref 3.5–5.2)
ALT: 18 U/L (ref 0–53)
AST: 20 U/L (ref 0–37)
Alkaline Phosphatase: 67 U/L (ref 39–117)
Bilirubin, Direct: 0.1 mg/dL (ref 0.0–0.3)
TOTAL PROTEIN: 6.6 g/dL (ref 6.0–8.3)
Total Bilirubin: 0.7 mg/dL (ref 0.2–1.2)

## 2014-01-15 LAB — TSH: TSH: 1.89 u[IU]/mL (ref 0.35–4.50)

## 2014-01-22 ENCOUNTER — Encounter: Payer: Self-pay | Admitting: Internal Medicine

## 2014-01-22 ENCOUNTER — Ambulatory Visit (INDEPENDENT_AMBULATORY_CARE_PROVIDER_SITE_OTHER): Payer: Medicare Other | Admitting: Internal Medicine

## 2014-01-22 VITALS — BP 120/80 | HR 80 | Temp 98.7°F | Ht 66.0 in | Wt 198.0 lb

## 2014-01-22 DIAGNOSIS — T50905A Adverse effect of unspecified drugs, medicaments and biological substances, initial encounter: Secondary | ICD-10-CM

## 2014-01-22 DIAGNOSIS — L299 Pruritus, unspecified: Secondary | ICD-10-CM

## 2014-01-22 DIAGNOSIS — E0789 Other specified disorders of thyroid: Secondary | ICD-10-CM

## 2014-01-22 DIAGNOSIS — L298 Other pruritus: Secondary | ICD-10-CM

## 2014-01-22 DIAGNOSIS — T50904A Poisoning by unspecified drugs, medicaments and biological substances, undetermined, initial encounter: Secondary | ICD-10-CM

## 2014-01-22 DIAGNOSIS — D497 Neoplasm of unspecified behavior of endocrine glands and other parts of nervous system: Secondary | ICD-10-CM

## 2014-01-22 LAB — HEPATIC FUNCTION PANEL
ALT: 18 U/L (ref 0–53)
AST: 20 U/L (ref 0–37)
Albumin: 3.8 g/dL (ref 3.5–5.2)
Alkaline Phosphatase: 64 U/L (ref 39–117)
BILIRUBIN DIRECT: 0 mg/dL (ref 0.0–0.3)
BILIRUBIN TOTAL: 0.6 mg/dL (ref 0.2–1.2)
Total Protein: 7.1 g/dL (ref 6.0–8.3)

## 2014-01-22 LAB — T3, FREE: T3, Free: 2.8 pg/mL (ref 2.3–4.2)

## 2014-01-22 LAB — CBC WITH DIFFERENTIAL/PLATELET
BASOS PCT: 0.7 % (ref 0.0–3.0)
Basophils Absolute: 0 10*3/uL (ref 0.0–0.1)
EOS ABS: 0.1 10*3/uL (ref 0.0–0.7)
EOS PCT: 1.5 % (ref 0.0–5.0)
HCT: 40.1 % (ref 39.0–52.0)
Hemoglobin: 13.6 g/dL (ref 13.0–17.0)
Lymphocytes Relative: 29.9 % (ref 12.0–46.0)
Lymphs Abs: 1.9 10*3/uL (ref 0.7–4.0)
MCHC: 33.9 g/dL (ref 30.0–36.0)
MCV: 91.2 fl (ref 78.0–100.0)
Monocytes Absolute: 0.6 10*3/uL (ref 0.1–1.0)
Monocytes Relative: 9.3 % (ref 3.0–12.0)
NEUTROS PCT: 58.6 % (ref 43.0–77.0)
Neutro Abs: 3.8 10*3/uL (ref 1.4–7.7)
Platelets: 230 10*3/uL (ref 150.0–400.0)
RBC: 4.4 Mil/uL (ref 4.22–5.81)
RDW: 14 % (ref 11.5–15.5)
WBC: 6.4 10*3/uL (ref 4.0–10.5)

## 2014-01-22 LAB — T4, FREE: FREE T4: 1.4 ng/dL (ref 0.60–1.60)

## 2014-01-22 LAB — SEDIMENTATION RATE: Sed Rate: 28 mm/hr — ABNORMAL HIGH (ref 0–22)

## 2014-01-22 MED ORDER — METHYLPREDNISOLONE (PAK) 4 MG PO TABS: ORAL_TABLET | ORAL | Status: DC

## 2014-01-22 NOTE — Progress Notes (Signed)
Subjective:    Patient ID: Jay Webster, male    DOB: 1932-08-11, 80 y.o.   MRN: [id number]  HPI Itching  Diffuse without fever. Has been using anti itching cream. Using dove soap No new medications Started about one week ago  wellness exam today Stable lipids iching started after the labs  Increased thyroid mass Has seen Dr Harlow Asa in the past Has been on benadryl prn for the itching   Review of Systems  Constitutional: Negative for fever and fatigue.  HENT: Negative for congestion, hearing loss and postnasal drip.   Eyes: Negative for discharge, redness and visual disturbance.  Respiratory: Negative for cough, shortness of breath and wheezing.   Cardiovascular: Negative for leg swelling.  Gastrointestinal: Negative for abdominal pain, constipation and abdominal distention.  Genitourinary: Negative for urgency and frequency.  Musculoskeletal: Negative for arthralgias, joint swelling and neck pain.  Skin: Negative for color change.       itching  Neurological: Negative for weakness and light-headedness.  Hematological: Negative for adenopathy.  Psychiatric/Behavioral: Negative.  Negative for behavioral problems.   Past Medical History  Diagnosis Date  . Hypertension   . Low back pain   . Thyroid disease   . RBBB (right bundle branch block with left anterior fascicular block)   . Uric acid renal calculus 06/20/2007    Qualifier: Diagnosis of  By: Rogue Bussing CMA, Maryann Alar    . Hypothyroidism   . Seasonal allergies   . History of kidney stones   . History of shingles     RESOLVED  . Arthritis     OA AND PAIN LEFT HIP AND OTHER JOINT PAINS  . Cancer     THYROID CANCER - HAD THYROID REMOVED  . Macular degeneration of both eyes     History   Social History  . Marital Status: Significant Other    Spouse Name: N/A    Number of Children: N/A  . Years of Education: N/A   Occupational History  . retired    Social History Main Topics  . Smoking status:  Former Research scientist (life sciences)  . Smokeless tobacco: Not on file  . Alcohol Use: No  . Drug Use: No  . Sexual Activity: Yes   Other Topics Concern  . Not on file   Social History Narrative  . No narrative on file    Past Surgical History  Procedure Laterality Date  . Colonoscopy    . Appendectomy    . Rotator cuff repair    . Thyroidectomy  2010  . Removal of first rib   NOV 1994    FOR COMPRESSED VEIN BETWEEN 1 ST RIB AND COLLARBONE  . Total hip arthroplasty Left 05/31/2013    Procedure: LEFT TOTAL HIP ARTHROPLASTY ANTERIOR APPROACH;  Surgeon: Gearlean Alf, MD;  Location: [facility];  Service: Orthopedics;  Laterality: Left;    Family History  Problem Relation Age of Onset  . Anemia    . Thyroid disease    . Heart disease Mother   . Diabetes Mother     Allergies  Allergen Reactions  . Penicillins Swelling and Palpitations    Swelling of joints.    Current Outpatient Prescriptions on File Prior to Visit  Medication Sig Dispense Refill  . allopurinol (ZYLOPRIM) 300 MG tablet Take 300 mg by mouth at bedtime.      . benazepril (LOTENSIN) 20 MG tablet Take 20 mg by mouth daily before breakfast.      . cetirizine (ZYRTEC) 10 MG  tablet Take 10 mg by mouth daily.      Marland Kitchen levothyroxine (SYNTHROID, LEVOTHROID) 150 MCG tablet Take 150 mcg by mouth daily before breakfast.      . loratadine (ALAVERT) 10 MG tablet Take 10 mg by mouth daily as needed for allergies.      . Multiple Vitamins-Minerals (OCUVITE ADULT 50+) CAPS Take 1 capsule by mouth daily.       No current facility-administered medications on file prior to visit.    BP 120/80  Pulse 80  Temp(Src) 98.7 F (37.1 C) (Oral)  Ht 5\' 6"  (1.676 m)  Wt 198 lb (89.812 kg)  BMI 31.97 kg/m2  SpO2 96%       Objective:   Physical Exam  Nursing note and vitals reviewed. Constitutional: He is oriented to person, place, and time. He appears well-developed and well-nourished.  HENT:  Head: Normocephalic and atraumatic.  Eyes:  Conjunctivae are normal. Pupils are equal, round, and reactive to light.  Neck: Normal range of motion. Neck supple.  Cardiovascular: Normal rate and regular rhythm.   Pulmonary/Chest: Effort normal and breath sounds normal.  Abdominal: Soft. Bowel sounds are normal.  Neurological: He is alert and oriented to person, place, and time.  Skin: Skin is warm and dry. No rash noted. No erythema.  Psychiatric: He has a normal mood and affect. His behavior is normal.          Assessment & Plan:  Patient presents for yearly preventative medicine examination. Medicare questionnaire was completed  All immunizations and health maintenance protocols were reviewed with the patient and needed orders were placed.  Appropriate screening laboratory values were ordered for the patient including screening of hyperlipidemia, renal function and hepatic function. If indicated by BPH, a PSA was ordered.  Medication reconciliation,  past medical history, social history, problem list and allergies were reviewed in detail with the patient  Goals were established with regard to weight loss, exercise, and  diet in compliance with medications  End of life planning was discussed.  Itching.... T3 and T 4 due to history of thyroid cancer. March 2010 CT of head and neck at [place] Refer back to Dr Harlow Asa  Medrol dose pack  And resume the zytec

## 2014-01-22 NOTE — Patient Instructions (Signed)
Alvino oatmeal bath  Resume the zyrtec twice a day for week Then get the medrol dose pack

## 2014-01-22 NOTE — Progress Notes (Signed)
Pre visit review using our clinic review tool, if applicable. No additional management support is needed unless otherwise documented below in the visit note. 

## 2014-01-26 ENCOUNTER — Ambulatory Visit (INDEPENDENT_AMBULATORY_CARE_PROVIDER_SITE_OTHER)
Admission: RE | Admit: 2014-01-26 | Discharge: 2014-01-26 | Disposition: A | Discharge status: Home / Self Care | Payer: Medicare Other | Source: Ambulatory Visit | Attending: Internal Medicine | Admitting: Internal Medicine

## 2014-01-26 DIAGNOSIS — D497 Neoplasm of unspecified behavior of endocrine glands and other parts of nervous system: Secondary | ICD-10-CM

## 2014-01-26 DIAGNOSIS — E0789 Other specified disorders of thyroid: Secondary | ICD-10-CM

## 2014-01-26 MED ORDER — IOHEXOL 300 MG/ML  SOLN
76.0000 mL | Freq: Once | INTRAMUSCULAR | Status: AC | PRN
  Administered 2014-01-26: 76 mL via INTRAVENOUS

## 2014-01-30 ENCOUNTER — Telehealth: Payer: Self-pay | Admitting: Internal Medicine

## 2014-01-30 NOTE — Telephone Encounter (Signed)
Derinda Late, does this need to go to you or Dr Arnoldo Morale?

## 2014-01-30 NOTE — Telephone Encounter (Signed)
OK 

## 2014-01-30 NOTE — Telephone Encounter (Signed)
Patient does not wish to proceed with colonoscopy recall.  He will call back for any other GI concerns

## 2014-01-30 NOTE — Telephone Encounter (Signed)
Pt states that dr. Arnoldo Morale ordered a blood test on 01/19/14, pt states that [facility] denied the test and pt is now needing a justification for the blood test, because the payment was denied.

## 2014-02-05 ENCOUNTER — Encounter: Payer: Self-pay | Admitting: Physician Assistant

## 2014-02-05 ENCOUNTER — Ambulatory Visit (INDEPENDENT_AMBULATORY_CARE_PROVIDER_SITE_OTHER): Payer: Medicare Other | Admitting: Physician Assistant

## 2014-02-05 VITALS — BP 118/80 | HR 78 | Temp 98.4°F | Resp 18 | Wt 195.0 lb

## 2014-02-05 DIAGNOSIS — T50905A Adverse effect of unspecified drugs, medicaments and biological substances, initial encounter: Secondary | ICD-10-CM

## 2014-02-05 DIAGNOSIS — L299 Pruritus, unspecified: Secondary | ICD-10-CM

## 2014-02-05 DIAGNOSIS — L298 Other pruritus: Secondary | ICD-10-CM

## 2014-02-05 DIAGNOSIS — T50904A Poisoning by unspecified drugs, medicaments and biological substances, undetermined, initial encounter: Secondary | ICD-10-CM

## 2014-02-05 DIAGNOSIS — Z09 Encounter for follow-up examination after completed treatment for conditions other than malignant neoplasm: Secondary | ICD-10-CM

## 2014-02-05 NOTE — Progress Notes (Signed)
Pre visit review using our clinic review tool, if applicable. No additional management support is needed unless otherwise documented below in the visit note. 

## 2014-02-05 NOTE — Patient Instructions (Signed)
Follow up as needed.   Health Maintenance, Males A healthy lifestyle and preventative care can promote health and wellness.  Maintain regular health, dental, and eye exams.  Eat a healthy diet. Foods like vegetables, fruits, whole grains, low-fat dairy products, and lean protein foods contain the nutrients you need and are low in calories. Decrease your intake of foods high in solid fats, added sugars, and salt. Get information about a proper diet from your health care provider, if necessary.  Regular physical exercise is one of the most important things you can do for your health. Most adults should get at least 150 minutes of moderate-intensity exercise (any activity that increases your heart rate and causes you to sweat) each week. In addition, most adults need muscle-strengthening exercises on 2 or more days a week.   Maintain a healthy weight. The body mass index (BMI) is a screening tool to identify possible weight problems. It provides an estimate of body fat based on height and weight. Your health care provider can find your BMI and can help you achieve or maintain a healthy weight. For males 20 years and older:  A BMI below 18.5 is considered underweight.  A BMI of 18.5 to 24.9 is normal.  A BMI of 25 to 29.9 is considered overweight.  A BMI of 30 and above is considered obese.  Maintain normal blood lipids and cholesterol by exercising and minimizing your intake of saturated fat. Eat a balanced diet with plenty of fruits and vegetables. Blood tests for lipids and cholesterol should begin at age 74 and be repeated every 5 years. If your lipid or cholesterol levels are high, you are over 50, or you are at high risk for heart disease, you may need your cholesterol levels checked more frequently.Ongoing high lipid and cholesterol levels should be treated with medicines, if diet and exercise are not working.  If you smoke, find out from your health care provider how to quit. If you do  not use tobacco, do not start.  Lung cancer screening is recommended for adults aged 17 80 years who are at high risk for developing lung cancer because of a history of smoking. A yearly low-dose CT scan of the lungs is recommended for people who have at least a 30-pack-year history of smoking and are a current smoker or have quit within the past 15 years. A pack year of smoking is smoking an average of 1 pack of cigarettes a day for 1 year (for example, a 30-pack-year history of smoking could mean smoking 1 pack a day for 30 years or 2 packs a day for 15 years). Yearly screening should continue until the smoker has stopped smoking for at least 15 years. Yearly screening should be stopped for people who develop a health problem that would prevent them from having lung cancer treatment.  If you choose to drink alcohol, do not have more than 2 drinks per day. One drink is considered to be 12 oz (360 mL) of beer, 5 oz (150 mL) of wine, or 1.5 oz (45 mL) of liquor.  Avoid use of street drugs. Do not share needles with anyone. Ask for help if you need support or instructions about stopping the use of drugs.  High blood pressure causes heart disease and increases the risk of stroke. Blood pressure should be checked at least every 1 2 years. Ongoing high blood pressure should be treated with medicines if weight loss and exercise are not effective.  If you are 45  78 years old, ask your health care provider if you should take aspirin to prevent heart disease.  Diabetes screening involves taking a blood sample to check your fasting blood sugar level. This should be done once every 3 years after age 65, if you are at a normal weight and without risk factors for diabetes. Testing should be considered at a younger age or be carried out more frequently if you are overweight and have at least 1 risk factor for diabetes.  Colorectal cancer can be detected and often prevented. Most routine colorectal cancer screening  begins at the age of 26 and continues through age 33. However, your health care provider may recommend screening at an earlier age if you have risk factors for colon cancer. On a yearly basis, your health care provider may provide home test kits to check for hidden blood in the stool. A small camera at the end of a tube may be used to directly examine the colon (sigmoidoscopy or colonoscopy) to detect the earliest forms of colorectal cancer. Talk to your health care provider about this at age 83, when routine screening begins. A direct exam of the colon should be repeated every 5 10 years through age 67, unless early forms of pre-cancerous polyps or small growths are found.  People who are at an increased risk for hepatitis B should be screened for this virus. You are considered at high risk for hepatitis B if:  You were born in a country where hepatitis B occurs often. Talk with your health care provider about which countries are considered high-risk.  Your parents were born in a high-risk country and you have not received a shot to protect against hepatitis B (hepatitis B vaccine).  You have HIV or AIDS.  You use needles to inject street drugs.  You live with, or have sex with, someone who has hepatitis B.  You are a man who has sex with other men (MSM).  You get hemodialysis treatment.  You take certain medicines for conditions like cancer, organ transplantation, and autoimmune conditions.  Hepatitis C blood testing is recommended for all people born from [birth year] through 1963 and any individual with known risk factors for hepatitis C.  Healthy men should no longer receive prostate-specific antigen (PSA) blood tests as part of routine cancer screening. Talk to your health care provider about prostate cancer screening.  Testicular cancer screening is not recommended for adolescents or adult males who have no symptoms. Screening includes self-exam, a health care provider exam, and other  screening tests. Consult with your health care provider about any symptoms you have or any concerns you have about testicular cancer.  Practice safe sex. Use condoms and avoid high-risk sexual practices to reduce the spread of sexually transmitted infections (STIs).  Use sunscreen. Apply sunscreen liberally and repeatedly throughout the day. You should seek shade when your shadow is shorter than you. Protect yourself by wearing long sleeves, pants, a wide-brimmed hat, and sunglasses year round, whenever you are outdoors.  Tell your health care provider of new moles or changes in moles, especially if there is a change in shape or color. Also tell your provider if a mole is larger than the size of a pencil eraser.  A one-time screening for abdominal aortic aneurysm (AAA) and surgical repair of large AAAs by ultrasound is recommended for men aged 83 75 years who are current or former smokers.  Stay current with your vaccines (immunizations). Document Released: 02/20/2008 Document Revised: 06/14/2013 Document Reviewed: 01/19/2011  ExitCare Patient Information 2014 [employer].  

## 2014-02-05 NOTE — Progress Notes (Signed)
Subjective:    Patient ID: Jay Webster, male    DOB: 1932/01/15, 80 y.o.   MRN: [id number]  HPI Patient is a 80 year old Caucasian male presenting to the clinic for followup of itching. Patient states that 2 weeks ago he was seen for head to waist itching with no rash. He is still unsure of what the actual cause of this was. He was prescribed a prednisone Dosepak for this. He states that the prednisone Dosepak completely resolved his symptoms. He is no longer experiencing itching symptoms. He is currently asymptomatic. He denies fevers, chills, nausea, vomiting, diarrhea, shortness of breath.   Review of Systems As per the history of present illness and otherwise negative.   Past Medical History  Diagnosis Date  . Hypertension   . Low back pain   . Thyroid disease   . RBBB (right bundle branch block with left anterior fascicular block)   . Uric acid renal calculus 06/20/2007    Qualifier: Diagnosis of  By: Rogue Bussing CMA, Maryann Alar    . Hypothyroidism   . Seasonal allergies   . History of kidney stones   . History of shingles     RESOLVED  . Arthritis     OA AND PAIN LEFT HIP AND OTHER JOINT PAINS  . Cancer     THYROID CANCER - HAD THYROID REMOVED  . Macular degeneration of both eyes    Past Surgical History  Procedure Laterality Date  . Colonoscopy    . Appendectomy    . Rotator cuff repair    . Thyroidectomy  2010  . Removal of first rib   NOV 1994    FOR COMPRESSED VEIN BETWEEN 1 ST RIB AND COLLARBONE  . Total hip arthroplasty Left 05/31/2013    Procedure: LEFT TOTAL HIP ARTHROPLASTY ANTERIOR APPROACH;  Surgeon: Gearlean Alf, MD;  Location: [facility];  Service: Orthopedics;  Laterality: Left;    reports that he has quit smoking. He does not have any smokeless tobacco history on file. He reports that he does not drink alcohol or use illicit drugs. family history includes Anemia in an other family member; Diabetes in his mother; Heart disease in his mother; Thyroid  disease in an other family member. Allergies  Allergen Reactions  . Penicillins Swelling and Palpitations    Swelling of joints.        Objective:   Physical Exam  Nursing note and vitals reviewed. Constitutional: He is oriented to person, place, and time. He appears well-developed and well-nourished. No distress.  HENT:  Head: Normocephalic and atraumatic.  Eyes: Conjunctivae and EOM are normal. Pupils are equal, round, and reactive to light.  Neck: Normal range of motion. Neck supple.  Cardiovascular: Normal rate, regular rhythm, normal heart sounds and intact distal pulses.  Exam reveals no gallop and no friction rub.   No murmur heard. Pulmonary/Chest: Effort normal and breath sounds normal. No respiratory distress. He has no wheezes. He has no rales. He exhibits no tenderness.  Musculoskeletal: Normal range of motion. He exhibits no edema.  Neurological: He is alert and oriented to person, place, and time.  Skin: Skin is warm and dry. No rash noted. He is not diaphoretic. No erythema. No pallor.  Psychiatric: He has a normal mood and affect. His behavior is normal. Judgment and thought content normal.    Filed Vitals:   02/05/14 0920  BP: 118/80  Pulse: 78  Temp: 98.4 F (36.9 C)  Resp: 18    Lab Results  Component Value Date   WBC 6.4 01/22/2014   HGB 13.6 01/22/2014   HCT 40.1 01/22/2014   PLT 230.0 01/22/2014   GLUCOSE 94 01/15/2014   CHOL 133 01/15/2014   TRIG 24.0 01/15/2014   HDL 55.90 01/15/2014   LDLCALC 72 01/15/2014   ALT 18 01/22/2014   AST 20 01/22/2014   NA 137 01/15/2014   K 4.2 01/15/2014   CL 104 01/15/2014   CREATININE 0.8 01/15/2014   BUN 16 01/15/2014   CO2 25 01/15/2014   TSH 1.89 01/15/2014   PSA 0.99 01/15/2014   INR 1.03 05/16/2013        Assessment & Plan:  Jay Webster was seen today for follow-up itching.  Diagnoses and associated orders for this visit:  Follow up Comments: Provided copy of lab results. Went over results with pt. - No change in  management per lab results. Itching due to drug Comments: Resolved. Prednisone dose pack helped this resolve.    Plan to follow up as needed.  Patient Instructions  Follow up as needed.

## 2014-02-21 ENCOUNTER — Ambulatory Visit (INDEPENDENT_AMBULATORY_CARE_PROVIDER_SITE_OTHER): Payer: Medicare Other | Admitting: Surgery

## 2014-02-21 ENCOUNTER — Encounter (INDEPENDENT_AMBULATORY_CARE_PROVIDER_SITE_OTHER): Payer: Self-pay | Admitting: Surgery

## 2014-02-21 VITALS — BP 128/82 | HR 78 | Temp 98.0°F | Ht 66.0 in | Wt 194.0 lb

## 2014-02-21 DIAGNOSIS — R220 Localized swelling, mass and lump, head: Secondary | ICD-10-CM

## 2014-02-21 DIAGNOSIS — R221 Localized swelling, mass and lump, neck: Secondary | ICD-10-CM

## 2014-02-21 DIAGNOSIS — Z85850 Personal history of malignant neoplasm of thyroid: Secondary | ICD-10-CM

## 2014-02-21 NOTE — Patient Instructions (Signed)
Lipoma  A lipoma is a noncancerous (benign) tumor composed of fat cells. They are usually found under the skin (subcutaneous). A lipoma may occur in any tissue of the body that contains fat. Common areas for lipomas to appear include the back, shoulders, buttocks, and thighs. Lipomas are a very common soft tissue growth. They are soft and grow slowly. Most problems caused by a lipoma depend on where it is growing.  DIAGNOSIS   A lipoma can be diagnosed with a physical exam. These tumors rarely become cancerous, but radiographic studies can help determine this for certain. Studies used may include:  · Computerized X-ray scans (CT or CAT scan).  · Computerized magnetic scans (MRI).  TREATMENT   Small lipomas that are not causing problems may be watched. If a lipoma continues to enlarge or causes problems, removal is often the best treatment. Lipomas can also be removed to improve appearance. Surgery is done to remove the fatty cells and the surrounding capsule. Most often, this is done with medicine that numbs the area (local anesthetic). The removed tissue is examined under a microscope to make sure it is not cancerous. Keep all follow-up appointments with your caregiver.  SEEK MEDICAL CARE IF:   · The lipoma becomes larger or hard.  · The lipoma becomes painful, red, or increasingly swollen. These could be signs of infection or a more serious condition.  Document Released: 08/14/2002 Document Revised: 11/16/2011 Document Reviewed: 01/24/2010  ExitCare® Patient Information ©2015 [employer]. This information is not intended to replace advice given to you by your health care provider. Make sure you discuss any questions you have with your health care provider.

## 2014-02-21 NOTE — Progress Notes (Signed)
General Surgery [facility]  Chief Complaint  Patient presents with  . Follow-up    evaluate neck mass - personal history of thyroid cancer - referral from Dr. Benay Pillow    HISTORY: Patient is a 80 year old male known to my practice from previous thyroidectomy for management of an early stage thyroid carcinoma. Patient has a soft tissue mass in the left neck which has been present for several years. It was recently noted by his dentist on routine examination.  Patient's primary care physician ordered a CT scan of the neck. This showed the mass to be a simple lipoma measuring 1.0 x 2.8 x 2.6 cm and was minimally enlarged compared to a prior scan in August 2012. There was no sign of recurrence of his thyroid cancer and no sign of metastatic disease.  PERTINENT REVIEW OF SYSTEMS: Denies pain. Denies tremor. Denies palpitation. Denies new palpable masses.  EXAM: HEENT: normocephalic; pupils equal and reactive; sclerae clear; dentition good; mucous membranes moist NECK:  Well-healed cervical incision; no palpable masses in the thyroid bed; palpable soft tissue mass in the subcutaneous tissues overlying the anterior and inferior portion of the left sternocleidomastoid muscle; symmetric on extension; no palpable anterior or posterior cervical lymphadenopathy; no supraclavicular masses; no tenderness CHEST: clear to auscultation bilaterally without rales, rhonchi, or wheezes CARDIAC: regular rate and rhythm without significant murmur; peripheral pulses are full EXT:  non-tender without edema; no deformity NEURO: no gross focal deficits; no sign of tremor   IMPRESSION: #1 personal history of thyroid carcinoma, no evidence of recurrent disease #2 postsurgical hypothyroidism #3 soft tissue mass left neck, 2.8 cm, consistent with benign lipoma  PLAN: Patient and I discussed these findings and review the CT scan results. This is almost certainly a benign lipoma which has not  changed significantly over the past several years. I do not think it requires surgical excision unless it is symptomatic. Patient does not wish to have it removed at this time. Certainly if it enlarges or changes in character, he should call me and we will proceed with surgical excision on an outpatient surgical basis.  Patient will return for surgical care as needed.  Earnstine Regal, MD, [facility] Office: [phone]  Visit Diagnoses: 1. History of thyroid cancer   2. Mass of left side of neck, 2.8 cm

## 2014-03-26 ENCOUNTER — Telehealth: Payer: Self-pay | Admitting: Internal Medicine

## 2014-03-26 ENCOUNTER — Other Ambulatory Visit: Payer: Self-pay | Admitting: Internal Medicine

## 2014-03-26 NOTE — Telephone Encounter (Signed)
Pt would like to switch to dr kwiatkowski °

## 2014-03-26 NOTE — Telephone Encounter (Signed)
ok 

## 2014-03-27 NOTE — Telephone Encounter (Signed)
Pt is aware.  

## 2014-03-28 ENCOUNTER — Other Ambulatory Visit: Payer: Self-pay | Admitting: Internal Medicine

## 2014-04-12 DIAGNOSIS — H25813 Combined forms of age-related cataract, bilateral: Secondary | ICD-10-CM | POA: Insufficient documentation

## 2014-06-25 ENCOUNTER — Other Ambulatory Visit: Payer: Self-pay | Admitting: Internal Medicine

## 2014-07-18 ENCOUNTER — Ambulatory Visit (INDEPENDENT_AMBULATORY_CARE_PROVIDER_SITE_OTHER): Payer: Medicare Other | Admitting: Internal Medicine

## 2014-07-18 ENCOUNTER — Encounter: Payer: Self-pay | Admitting: Internal Medicine

## 2014-07-18 VITALS — BP 128/80 | HR 70 | Resp 20 | Ht 66.0 in | Wt 197.0 lb

## 2014-07-18 DIAGNOSIS — I10 Essential (primary) hypertension: Secondary | ICD-10-CM

## 2014-07-18 DIAGNOSIS — R221 Localized swelling, mass and lump, neck: Secondary | ICD-10-CM

## 2014-07-18 DIAGNOSIS — C73 Malignant neoplasm of thyroid gland: Secondary | ICD-10-CM

## 2014-07-18 DIAGNOSIS — M150 Primary generalized (osteo)arthritis: Secondary | ICD-10-CM

## 2014-07-18 DIAGNOSIS — M159 Polyosteoarthritis, unspecified: Secondary | ICD-10-CM

## 2014-07-18 MED ORDER — FLUTICASONE PROPIONATE 50 MCG/ACT NA SUSP: 2.0000 | Freq: Every day | NASAL | Status: DC

## 2014-07-18 NOTE — Progress Notes (Signed)
Pre visit review using our clinic review tool, if applicable. No additional management support is needed unless otherwise documented below in the visit note. 

## 2014-07-18 NOTE — Progress Notes (Signed)
Subjective:    Patient ID: Jay Webster, male    DOB: Nov 04, 1931, 80 y.o.   MRN: [id number]  HPI  80 year old patient who is seen today in follow-up.  He has treated hypertension, osteoarthritis and remote history of papillary thyroid cancer.  He has been seen by general surgery recently due to a benign nodule in the left neck area consistent with a lipoma. He is doing quite well without concerns or complaints.  He has a history of kidney stone disease which has been stable.  He has done quite well following left total hip replacement surgery about 1 year ago.  He is followed closely by ophthalmology due to macular degeneration.  Past Medical History  Diagnosis Date  . Hypertension   . Low back pain   . Thyroid disease   . RBBB (right bundle branch block with left anterior fascicular block)   . Uric acid renal calculus 06/20/2007    Qualifier: Diagnosis of  By: Rogue Bussing CMA, Maryann Alar    . Hypothyroidism   . Seasonal allergies   . History of kidney stones   . History of shingles     RESOLVED  . Arthritis     OA AND PAIN LEFT HIP AND OTHER JOINT PAINS  . Cancer     THYROID CANCER - HAD THYROID REMOVED  . Macular degeneration of both eyes     History   Social History  . Marital Status: Married    Spouse Name: N/A    Number of Children: N/A  . Years of Education: N/A   Occupational History  . retired    Social History Main Topics  . Smoking status: Former Smoker    Types: Cigarettes    Quit date: 02/22/1979  . Smokeless tobacco: Not on file  . Alcohol Use: Yes  . Drug Use: No  . Sexual Activity: Yes   Other Topics Concern  . Not on file   Social History Narrative    Past Surgical History  Procedure Laterality Date  . Colonoscopy    . Appendectomy    . Rotator cuff repair    . Thyroidectomy  2010  . Removal of first rib   NOV 1994    FOR COMPRESSED VEIN BETWEEN 1 ST RIB AND COLLARBONE  . Total hip arthroplasty Left 05/31/2013    Procedure: LEFT TOTAL  HIP ARTHROPLASTY ANTERIOR APPROACH;  Surgeon: Gearlean Alf, MD;  Location: [facility];  Service: Orthopedics;  Laterality: Left;    Family History  Problem Relation Age of Onset  . Anemia    . Thyroid disease    . Heart disease Mother   . Diabetes Mother     Allergies  Allergen Reactions  . Penicillins Swelling and Palpitations    Swelling of joints.    Current Outpatient Prescriptions on File Prior to Visit  Medication Sig Dispense Refill  . Aflibercept (EYLEA) 2 MG/0.05ML SOLN Inject into the eye.    Marland Kitchen allopurinol (ZYLOPRIM) 300 MG tablet TAKE 1 TABLET BY MOUTH EVERY DAY 90 tablet 0  . benazepril (LOTENSIN) 20 MG tablet TAKE 1 TABLET BY MOUTH EVERY DAY 90 tablet 0  . cetirizine (ZYRTEC) 10 MG tablet Take 10 mg by mouth daily.    Marland Kitchen levothyroxine (SYNTHROID, LEVOTHROID) 150 MCG tablet TAKE 1 TABLET BY MOUTH EVERY DAY 90 tablet 0   No current facility-administered medications on file prior to visit.    BP 128/80 mmHg  Pulse 70  Resp 20  Ht 5\' 6"  (  1.676 m)  Wt 197 lb (89.359 kg)  BMI 31.81 kg/m2  SpO2 98%     Review of Systems  Constitutional: Negative for fever, chills, appetite change and fatigue.  HENT: Positive for hearing loss. Negative for congestion, dental problem, ear pain, sore throat, tinnitus, trouble swallowing and voice change.   Eyes: Negative for pain, discharge and visual disturbance.  Respiratory: Negative for cough, chest tightness, wheezing and stridor.   Cardiovascular: Negative for chest pain, palpitations and leg swelling.  Gastrointestinal: Negative for nausea, vomiting, abdominal pain, diarrhea, constipation, blood in stool and abdominal distention.  Genitourinary: Negative for urgency, hematuria, flank pain, discharge, difficulty urinating and genital sores.  Musculoskeletal: Negative for myalgias, back pain, joint swelling, arthralgias, gait problem and neck stiffness.  Skin: Negative for rash.  Neurological: Negative for dizziness, syncope,  speech difficulty, weakness, numbness and headaches.  Hematological: Negative for adenopathy. Does not bruise/bleed easily.  Psychiatric/Behavioral: Negative for behavioral problems and dysphoric mood. The patient is not nervous/anxious.        Objective:   Physical Exam  Constitutional: He appears well-developed and well-nourished.  Blood pressure 130/70  HENT:  Head: Normocephalic and atraumatic.  Right Ear: External ear normal.  Left Ear: External ear normal.  Nose: Nose normal.  Mouth/Throat: Oropharynx is clear and moist.  Left tympanic membrane slightly dull Weber did lateralize to the left  Eyes: Conjunctivae and EOM are normal. Pupils are equal, round, and reactive to light. No scleral icterus.  Neck: Normal range of motion. Neck supple. No JVD present. No thyromegaly present.  Soft, freely movable nodule, left upper neck area  Cardiovascular: Regular rhythm, normal heart sounds and intact distal pulses.  Exam reveals no gallop and no friction rub.   No murmur heard. Pulmonary/Chest: Effort normal and breath sounds normal. He exhibits no tenderness.  Abdominal: Soft. Bowel sounds are normal. He exhibits no distension and no mass. There is no tenderness.  Genitourinary: Prostate normal and penis normal.  Musculoskeletal: Normal range of motion. He exhibits no edema or tenderness.  Lymphadenopathy:    He has no cervical adenopathy.  Neurological: He is alert. He has normal reflexes. No cranial nerve deficit. Coordination normal.  Skin: Skin is warm and dry. No rash noted.  Psychiatric: He has a normal mood and affect. His behavior is normal.          Assessment & Plan:   Hypertension, well controlled Left serous otitis media Osteoarthritis History thyroid cancer  CPX 6 months

## 2014-07-18 NOTE — Patient Instructions (Addendum)
Serous Otitis Media Serous otitis media is fluid in the middle ear space. This space contains the bones for hearing and air. Air in the middle ear space helps to transmit sound.  The air gets there through the eustachian tube. This tube goes from the back of the nose (nasopharynx) to the middle ear space. It keeps the pressure in the middle ear the same as the outside world. It also helps to drain fluid from the middle ear space. CAUSES  Serous otitis media occurs when the eustachian tube gets blocked. Blockage can come from:  Ear infections.  Colds and other upper respiratory infections.  Allergies.  Irritants such as cigarette smoke.  Sudden changes in air pressure (such as descending in an airplane).  Enlarged adenoids.  A mass in the nasopharynx. During colds and upper respiratory infections, the middle ear space can become temporarily filled with fluid. This can happen after an ear infection also. Once the infection clears, the fluid will generally drain out of the ear through the eustachian tube. If it does not, then serous otitis media occurs. SIGNS AND SYMPTOMS   Hearing loss.  A feeling of fullness in the ear, without pain.  Young children may not show any symptoms but may show slight behavioral changes, such as agitation, ear pulling, or crying. DIAGNOSIS  Serous otitis media is diagnosed by an ear exam. Tests may be done to check on the movement of the eardrum. Hearing exams may also be done. TREATMENT  The fluid most often goes away without treatment. If allergy is the cause, allergy treatment may be helpful. Fluid that persists for several months may require minor surgery. A small tube is placed in the eardrum to:  Drain the fluid.  Restore the air in the middle ear space. In certain situations, antibiotic medicines are used to avoid surgery. Surgery may be done to remove enlarged adenoids (if this is the cause). HOME CARE INSTRUCTIONS   Keep children away from  tobacco smoke.  Keep all follow-up visits as directed by your health care provider. SEEK MEDICAL CARE IF:   Your hearing is not better in 3 months.  Your hearing is worse.  You have ear pain.  You have drainage from the ear.  You have dizziness.  You have serous otitis media only in one ear or have any bleeding from your nose (epistaxis).  You notice a lump on your neck. MAKE SURE YOU:  Understand these instructions.   Will watch your condition.   Will get help right away if you are not doing well or get worse.  Document Released: 11/14/2003 Document Revised: 01/08/2014 Document Reviewed: 03/21/2013 [facility] Patient Information 2015 [place], [place]. This information is not intended to replace advice given to you by your health care provider. Make sure you discuss any questions you have with your health care provider.  Limit your sodium (Salt) intake  Please check your blood pressure on a regular basis.  If it is consistently greater than 150/90, please make an office appointment.  Return in 6 months for follow-up

## 2014-08-14 ENCOUNTER — Encounter: Payer: Self-pay | Admitting: *Deleted

## 2014-09-10 ENCOUNTER — Telehealth: Payer: Self-pay | Admitting: Internal Medicine

## 2014-09-10 MED ORDER — HYDROCODONE-HOMATROPINE 5-1.5 MG/5ML PO SYRP: 5.0000 mL | ORAL_SOLUTION | Freq: Four times a day (QID) | ORAL | Status: DC | PRN

## 2014-09-10 NOTE — Telephone Encounter (Signed)
Hydromet 6 ounces 1 teaspoon  every 6 hours as needed for cough and congestion 

## 2014-09-10 NOTE — Telephone Encounter (Signed)
Pt notified Rx for cough medicine is ready to pickup. Rx printed and signed.

## 2014-09-10 NOTE — Telephone Encounter (Signed)
Please advise 

## 2014-09-10 NOTE — Telephone Encounter (Signed)
Has been taking Mucinex x 3 days and it is breaking up his congestion and it is clear but he has a cough that he cannot get to stop. Would like to have something called in please.

## 2014-09-17 ENCOUNTER — Other Ambulatory Visit: Payer: Self-pay | Admitting: Internal Medicine

## 2014-09-18 ENCOUNTER — Encounter: Payer: Self-pay | Admitting: Internal Medicine

## 2014-09-18 ENCOUNTER — Ambulatory Visit (INDEPENDENT_AMBULATORY_CARE_PROVIDER_SITE_OTHER)
Admission: RE | Admit: 2014-09-18 | Discharge: 2014-09-18 | Disposition: A | Discharge status: Home / Self Care | Payer: Medicare Other | Source: Ambulatory Visit | Attending: Internal Medicine | Admitting: Internal Medicine

## 2014-09-18 ENCOUNTER — Ambulatory Visit (INDEPENDENT_AMBULATORY_CARE_PROVIDER_SITE_OTHER): Payer: Medicare Other | Admitting: Internal Medicine

## 2014-09-18 VITALS — BP 130/76 | HR 81 | Temp 98.3°F | Resp 20 | Ht 66.0 in | Wt 195.0 lb

## 2014-09-18 DIAGNOSIS — J189 Pneumonia, unspecified organism: Secondary | ICD-10-CM

## 2014-09-18 DIAGNOSIS — E890 Postprocedural hypothyroidism: Secondary | ICD-10-CM

## 2014-09-18 DIAGNOSIS — M159 Polyosteoarthritis, unspecified: Secondary | ICD-10-CM

## 2014-09-18 DIAGNOSIS — M150 Primary generalized (osteo)arthritis: Secondary | ICD-10-CM

## 2014-09-18 DIAGNOSIS — I10 Essential (primary) hypertension: Secondary | ICD-10-CM

## 2014-09-18 MED ORDER — AZITHROMYCIN 250 MG PO TABS: ORAL_TABLET | ORAL | Status: DC

## 2014-09-18 MED ORDER — HYDROCODONE-HOMATROPINE 5-1.5 MG/5ML PO SYRP: 5.0000 mL | ORAL_SOLUTION | Freq: Four times a day (QID) | ORAL | Status: DC | PRN

## 2014-09-18 NOTE — Patient Instructions (Signed)
   Take over-the-counter expectorants and cough medications such as  Mucinex DM.  Call if there is no improvement in 5 to 7 days or if  you develop worsening cough, fever, or new symptoms, such as shortness of breath or chest pain.  Chest x-ray as discussed  Take your antibiotic as prescribed until ALL of it is gone, but stop if you develop a rash, swelling, or any side effects of the medication.  Contact our office as soon as possible if  there are side effects of the medication.

## 2014-09-18 NOTE — Progress Notes (Signed)
Subjective:    Patient ID: [place], male    DOB: 1932/01/22, 81 y.o.   MRN: [id number]  HPI 81 year old patient who presents with a eight-day history of cough.  Cough is largely nonproductive but he has had low-grade fever and worsening chest congestion.  He has been treated symptomatically.  Medical problems include hypertension and osteoarthritis.  No chills, shortness of breath or active wheezing.  Past Medical History  Diagnosis Date  . Hypertension   . Low back pain   . Thyroid disease   . RBBB (right bundle branch block with left anterior fascicular block)   . Uric acid renal calculus 06/20/2007    Qualifier: Diagnosis of  By: Rogue Bussing CMA, Maryann Alar    . Hypothyroidism   . Seasonal allergies   . History of kidney stones   . History of shingles     RESOLVED  . Arthritis     OA AND PAIN LEFT HIP AND OTHER JOINT PAINS  . Cancer     THYROID CANCER - HAD THYROID REMOVED  . Macular degeneration of both eyes     History   Social History  . Marital Status: Married    Spouse Name: N/A    Number of Children: N/A  . Years of Education: N/A   Occupational History  . retired    Social History Main Topics  . Smoking status: Former Smoker    Types: Cigarettes    Quit date: 02/22/1979  . Smokeless tobacco: Not on file  . Alcohol Use: Yes  . Drug Use: No  . Sexual Activity: Yes   Other Topics Concern  . Not on file   Social History Narrative    Past Surgical History  Procedure Laterality Date  . Colonoscopy    . Appendectomy  1948  . Rotator cuff repair  2009  . Thyroidectomy  2010  . Removal of first rib   NOV 1994    FOR COMPRESSED VEIN BETWEEN 1 ST RIB AND COLLARBONE  . Total hip arthroplasty Left 05/31/2013    Procedure: LEFT TOTAL HIP ARTHROPLASTY ANTERIOR APPROACH;  Surgeon: Gearlean Alf, MD;  Location: [facility];  Service: Orthopedics;  Laterality: Left;  . Kidney stone surgery  1971, 1976  . Elbow radical reduction  1973  . Rib removed, 1st   1994    Family History  Problem Relation Age of Onset  . Anemia    . Thyroid disease    . Heart disease Mother   . Diabetes Mother     Allergies  Allergen Reactions  . Penicillins Swelling and Palpitations    Swelling of joints.    Current Outpatient Prescriptions on File Prior to Visit  Medication Sig Dispense Refill  . Aflibercept (EYLEA) 2 MG/0.05ML SOLN Inject into the eye.    Marland Kitchen allopurinol (ZYLOPRIM) 300 MG tablet TAKE 1 TABLET BY MOUTH EVERY DAY 90 tablet 1  . benazepril (LOTENSIN) 20 MG tablet TAKE 1 TABLET BY MOUTH EVERY DAY 90 tablet 1  . cetirizine (ZYRTEC) 10 MG tablet Take 10 mg by mouth daily.    . clindamycin (CLEOCIN) 300 MG capsule Take 300 mg by mouth. PRIOR TO DENTAL PROCEDURES.  5  . fluticasone (FLONASE) 50 MCG/ACT nasal spray Place 2 sprays into both nostrils daily. 16 g 6  . HYDROcodone-homatropine (HYCODAN) 5-1.5 MG/5ML syrup Take 5 mLs by mouth every 6 (six) hours as needed for cough. 120 mL 0  . levothyroxine (SYNTHROID, LEVOTHROID) 150 MCG tablet TAKE 1 TABLET  BY MOUTH EVERY DAY 90 tablet 1  . Multiple Vitamins-Minerals (PRESERVISION AREDS 2 PO) Take 1 tablet by mouth 2 (two) times daily.     No current facility-administered medications on file prior to visit.    BP 130/76 mmHg  Pulse 81  Temp(Src) 98.3 F (36.8 C) (Oral)  Resp 20  Ht 5\' 6"  (1.676 m)  Wt 195 lb (88.451 kg)  BMI 31.49 kg/m2  SpO2 97%       Review of Systems  Constitutional: Positive for fever, activity change, appetite change and fatigue. Negative for chills.  HENT: Positive for congestion. Negative for dental problem, ear pain, hearing loss, sore throat, tinnitus, trouble swallowing and voice change.   Eyes: Negative for pain, discharge and visual disturbance.  Respiratory: Positive for cough. Negative for chest tightness, wheezing and stridor.   Cardiovascular: Negative for chest pain, palpitations and leg swelling.  Gastrointestinal: Negative for nausea, vomiting,  abdominal pain, diarrhea, constipation, blood in stool and abdominal distention.  Genitourinary: Negative for urgency, hematuria, flank pain, discharge, difficulty urinating and genital sores.  Musculoskeletal: Negative for myalgias, back pain, joint swelling, arthralgias, gait problem and neck stiffness.  Skin: Negative for rash.  Neurological: Negative for dizziness, syncope, speech difficulty, weakness, numbness and headaches.  Hematological: Negative for adenopathy. Does not bruise/bleed easily.  Psychiatric/Behavioral: Negative for behavioral problems and dysphoric mood. The patient is not nervous/anxious.        Objective:   Physical Exam  Constitutional: He is oriented to person, place, and time. He appears well-developed.  HENT:  Head: Normocephalic.  Right Ear: External ear normal.  Left Ear: External ear normal.  Eyes: Conjunctivae and EOM are normal.  Neck: Normal range of motion.  Cardiovascular: Normal rate and normal heart sounds.   Pulmonary/Chest: Effort normal.  Right thoracotomy scar Right lung clear Extensive rales on the left  No tachycardia or tachypnea O2 saturation 97%  Abdominal: Bowel sounds are normal.  Musculoskeletal: Normal range of motion. He exhibits no edema or tenderness.  Neurological: He is alert and oriented to person, place, and time.  Psychiatric: He has a normal mood and affect. His behavior is normal.          Assessment & Plan:   URI/bronchitis.  Rule out community acquired pneumonia.  Chest x-ray will be reviewed.  Will treat with azithromycin and expectorants Hypertension, stable Osteoarthritis

## 2014-09-18 NOTE — Progress Notes (Signed)
Pre visit review using our clinic review tool, if applicable. No additional management support is needed unless otherwise documented below in the visit note. 

## 2014-10-02 ENCOUNTER — Encounter: Payer: Self-pay | Admitting: Internal Medicine

## 2014-10-02 ENCOUNTER — Ambulatory Visit (INDEPENDENT_AMBULATORY_CARE_PROVIDER_SITE_OTHER): Payer: Medicare Other | Admitting: Internal Medicine

## 2014-10-02 DIAGNOSIS — M159 Polyosteoarthritis, unspecified: Secondary | ICD-10-CM

## 2014-10-02 DIAGNOSIS — M150 Primary generalized (osteo)arthritis: Secondary | ICD-10-CM

## 2014-10-02 DIAGNOSIS — Z23 Encounter for immunization: Secondary | ICD-10-CM

## 2014-10-02 DIAGNOSIS — I10 Essential (primary) hypertension: Secondary | ICD-10-CM

## 2014-10-02 NOTE — Progress Notes (Signed)
Subjective:    Patient ID: Jay Webster, male    DOB: 06/13/1932, 81 y.o.   MRN: [id number]  HPI 81 year old patient who is seen today in follow-up.  He was seen 2 days ago and treated with azithromycin for a community-acquired pneumonia.  He has had a very nice clinical response and today feels that he has fully recovered.  No cough, fever or other complaints.  He generally feels well.  Past Medical History  Diagnosis Date  . Hypertension   . Low back pain   . Thyroid disease   . RBBB (right bundle branch block with left anterior fascicular block)   . Uric acid renal calculus 06/20/2007    Qualifier: Diagnosis of  By: Rogue Bussing CMA, Maryann Alar    . Hypothyroidism   . Seasonal allergies   . History of kidney stones   . History of shingles     RESOLVED  . Arthritis     OA AND PAIN LEFT HIP AND OTHER JOINT PAINS  . Cancer     THYROID CANCER - HAD THYROID REMOVED  . Macular degeneration of both eyes     History   Social History  . Marital Status: Married    Spouse Name: N/A    Number of Children: N/A  . Years of Education: N/A   Occupational History  . retired    Social History Main Topics  . Smoking status: Former Smoker    Types: Cigarettes    Quit date: 02/22/1979  . Smokeless tobacco: Not on file  . Alcohol Use: Yes  . Drug Use: No  . Sexual Activity: Yes   Other Topics Concern  . Not on file   Social History Narrative    Past Surgical History  Procedure Laterality Date  . Colonoscopy    . Appendectomy  1948  . Rotator cuff repair  2009  . Thyroidectomy  2010  . Removal of first rib   NOV 1994    FOR COMPRESSED VEIN BETWEEN 1 ST RIB AND COLLARBONE  . Total hip arthroplasty Left 05/31/2013    Procedure: LEFT TOTAL HIP ARTHROPLASTY ANTERIOR APPROACH;  Surgeon: Gearlean Alf, MD;  Location: [facility];  Service: Orthopedics;  Laterality: Left;  . Kidney stone surgery  1971, 1976  . Elbow radical reduction  1973  . Rib removed, 1st  1994    Family  History  Problem Relation Age of Onset  . Anemia    . Thyroid disease    . Heart disease Mother   . Diabetes Mother     Allergies  Allergen Reactions  . Penicillins Swelling and Palpitations    Swelling of joints.    Current Outpatient Prescriptions on File Prior to Visit  Medication Sig Dispense Refill  . Aflibercept (EYLEA) 2 MG/0.05ML SOLN Inject into the eye.    Marland Kitchen allopurinol (ZYLOPRIM) 300 MG tablet TAKE 1 TABLET BY MOUTH EVERY DAY 90 tablet 1  . benazepril (LOTENSIN) 20 MG tablet TAKE 1 TABLET BY MOUTH EVERY DAY 90 tablet 1  . cetirizine (ZYRTEC) 10 MG tablet Take 10 mg by mouth daily.    . clindamycin (CLEOCIN) 300 MG capsule Take 300 mg by mouth. PRIOR TO DENTAL PROCEDURES.  5  . levothyroxine (SYNTHROID, LEVOTHROID) 150 MCG tablet TAKE 1 TABLET BY MOUTH EVERY DAY 90 tablet 1  . Multiple Vitamins-Minerals (PRESERVISION AREDS 2 PO) Take 1 tablet by mouth 2 (two) times daily.     No current facility-administered medications on file prior to  visit.    BP 118/70 mmHg  Pulse 71  Temp(Src) 97.9 F (36.6 C) (Oral)  Resp 20  Ht 5\' 6"  (1.676 m)  Wt 195 lb (88.451 kg)  BMI 31.49 kg/m2  SpO2 99%      Review of Systems  Constitutional: Negative for fever, chills, appetite change and fatigue.  HENT: Negative for congestion, dental problem, ear pain, hearing loss, sore throat, tinnitus, trouble swallowing and voice change.   Eyes: Negative for pain, discharge and visual disturbance.  Respiratory: Negative for cough, chest tightness, wheezing and stridor.   Cardiovascular: Negative for chest pain, palpitations and leg swelling.  Gastrointestinal: Negative for nausea, vomiting, abdominal pain, diarrhea, constipation, blood in stool and abdominal distention.  Genitourinary: Negative for urgency, hematuria, flank pain, discharge, difficulty urinating and genital sores.  Musculoskeletal: Negative for myalgias, back pain, joint swelling, arthralgias, gait problem and neck  stiffness.  Skin: Negative for rash.  Neurological: Negative for dizziness, syncope, speech difficulty, weakness, numbness and headaches.  Hematological: Negative for adenopathy. Does not bruise/bleed easily.  Psychiatric/Behavioral: Negative for behavioral problems and dysphoric mood. The patient is not nervous/anxious.        Objective:   Physical Exam  Constitutional: He is oriented to person, place, and time. He appears well-developed.  HENT:  Head: Normocephalic.  Right Ear: External ear normal.  Left Ear: External ear normal.  Eyes: Conjunctivae and EOM are normal.  Neck: Normal range of motion.  Cardiovascular: Normal rate and normal heart sounds.   Pulmonary/Chest:  Rales, left lower lung field, improved  Abdominal: Bowel sounds are normal.  Musculoskeletal: Normal range of motion. He exhibits no edema or tenderness.  Neurological: He is alert and oriented to person, place, and time.  Psychiatric: He has a normal mood and affect. His behavior is normal.          Assessment & Plan:   Status post required pneumonia.  Clinically stable Hypertension, well-controlled  CPX as scheduled this spring

## 2014-10-02 NOTE — Addendum Note (Signed)
Addended by: Marian Sorrow on: 10/02/2014 09:15 AM   Modules accepted: Orders

## 2014-10-02 NOTE — Patient Instructions (Signed)
Return as scheduled for your annual examination  Call or return to clinic prn if these symptoms worsen or fail to improve as anticipated.

## 2014-10-02 NOTE — Progress Notes (Signed)
Pre visit review using our clinic review tool, if applicable. No additional management support is needed unless otherwise documented below in the visit note. 

## 2014-10-04 ENCOUNTER — Telehealth: Payer: Self-pay | Admitting: Internal Medicine

## 2014-10-04 NOTE — Telephone Encounter (Signed)
emmi emailed °

## 2014-11-08 ENCOUNTER — Ambulatory Visit (INDEPENDENT_AMBULATORY_CARE_PROVIDER_SITE_OTHER): Payer: Medicare Other | Admitting: Internal Medicine

## 2014-11-08 ENCOUNTER — Telehealth: Payer: Self-pay | Admitting: Internal Medicine

## 2014-11-08 ENCOUNTER — Encounter: Payer: Self-pay | Admitting: Internal Medicine

## 2014-11-08 VITALS — BP 130/74 | HR 84 | Temp 98.4°F | Resp 20 | Ht 66.0 in | Wt 198.0 lb

## 2014-11-08 DIAGNOSIS — M150 Primary generalized (osteo)arthritis: Secondary | ICD-10-CM

## 2014-11-08 DIAGNOSIS — I10 Essential (primary) hypertension: Secondary | ICD-10-CM

## 2014-11-08 DIAGNOSIS — M159 Polyosteoarthritis, unspecified: Secondary | ICD-10-CM

## 2014-11-08 DIAGNOSIS — L299 Pruritus, unspecified: Secondary | ICD-10-CM

## 2014-11-08 MED ORDER — TRIAMCINOLONE ACETONIDE 0.1 % EX CREA: 1.0000 "application " | TOPICAL_CREAM | Freq: Two times a day (BID) | CUTANEOUS | Status: DC

## 2014-11-08 MED ORDER — PREDNISONE 10 MG PO TABS: 10.0000 mg | ORAL_TABLET | Freq: Two times a day (BID) | ORAL | Status: DC

## 2014-11-08 NOTE — Progress Notes (Signed)
Pre visit review using our clinic review tool, if applicable. No additional management support is needed unless otherwise documented below in the visit note. 

## 2014-11-08 NOTE — Progress Notes (Signed)
Subjective:    Patient ID: [place], male    DOB: [birth year], 81 y.o.   MRN: [id number]  HPI 81 year old patient who presents with a two-week history of a pruritic rash.  He states that he had a similar rash in June of last year that responded to oral prednisone.  The rash and pruritus is most marked over the upper back area with less involvement of the arms and legs. He has hypertension and osteoarthritis.  Did have been stable.  Past Medical History  Diagnosis Date  . Hypertension   . Low back pain   . Thyroid disease   . RBBB (right bundle branch block with left anterior fascicular block)   . Uric acid renal calculus 06/20/2007    Qualifier: Diagnosis of  By: Rogue Bussing CMA, Maryann Alar    . Hypothyroidism   . Seasonal allergies   . History of kidney stones   . History of shingles     RESOLVED  . Arthritis     OA AND PAIN LEFT HIP AND OTHER JOINT PAINS  . Cancer     THYROID CANCER - HAD THYROID REMOVED  . Macular degeneration of both eyes     History   Social History  . Marital Status: Married    Spouse Name: N/A  . Number of Children: N/A  . Years of Education: N/A   Occupational History  . retired    Social History Main Topics  . Smoking status: Former Smoker    Types: Cigarettes    Quit date: 02/22/1979  . Smokeless tobacco: Not on file  . Alcohol Use: Yes  . Drug Use: No  . Sexual Activity: Yes   Other Topics Concern  . Not on file   Social History Narrative    Past Surgical History  Procedure Laterality Date  . Colonoscopy    . Appendectomy  1948  . Rotator cuff repair  2009  . Thyroidectomy  2010  . Removal of first rib   NOV 1994    FOR COMPRESSED VEIN BETWEEN 1 ST RIB AND COLLARBONE  . Total hip arthroplasty Left 05/31/2013    Procedure: LEFT TOTAL HIP ARTHROPLASTY ANTERIOR APPROACH;  Surgeon: Gearlean Alf, MD;  Location: [facility];  Service: Orthopedics;  Laterality: Left;  . Kidney stone surgery  1971, 1976  . Elbow radical  reduction  1973  . Rib removed, 1st  1994    Family History  Problem Relation Age of Onset  . Anemia    . Thyroid disease    . Heart disease Mother   . Diabetes Mother     Allergies  Allergen Reactions  . Penicillins Swelling and Palpitations    Swelling of joints.    Current Outpatient Prescriptions on File Prior to Visit  Medication Sig Dispense Refill  . Aflibercept (EYLEA) 2 MG/0.05ML SOLN Inject into the eye.    Marland Kitchen allopurinol (ZYLOPRIM) 300 MG tablet TAKE 1 TABLET BY MOUTH EVERY DAY 90 tablet 1  . benazepril (LOTENSIN) 20 MG tablet TAKE 1 TABLET BY MOUTH EVERY DAY 90 tablet 1  . cetirizine (ZYRTEC) 10 MG tablet Take 10 mg by mouth daily.    Marland Kitchen levothyroxine (SYNTHROID, LEVOTHROID) 150 MCG tablet TAKE 1 TABLET BY MOUTH EVERY DAY 90 tablet 1  . Multiple Vitamin (MULTIVITAMIN) tablet Take 1 tablet by mouth daily.    . Multiple Vitamins-Minerals (PRESERVISION AREDS 2 PO) Take 1 tablet by mouth 2 (two) times daily.    . clindamycin (CLEOCIN) 300  MG capsule Take 300 mg by mouth. PRIOR TO DENTAL PROCEDURES.  5   No current facility-administered medications on file prior to visit.    BP 130/74 mmHg  Pulse 84  Temp(Src) 98.4 F (36.9 C) (Oral)  Resp 20  Ht 5\' 6"  (1.676 m)  Wt 198 lb (89.812 kg)  BMI 31.97 kg/m2  SpO2 98%      Review of Systems  Constitutional: Negative for fever, chills, appetite change and fatigue.  HENT: Negative for congestion, dental problem, ear pain, hearing loss, sore throat, tinnitus, trouble swallowing and voice change.   Eyes: Negative for pain, discharge and visual disturbance.  Respiratory: Negative for cough, chest tightness, wheezing and stridor.   Cardiovascular: Negative for chest pain, palpitations and leg swelling.  Gastrointestinal: Negative for nausea, vomiting, abdominal pain, diarrhea, constipation, blood in stool and abdominal distention.  Genitourinary: Negative for urgency, hematuria, flank pain, discharge, difficulty urinating  and genital sores.  Musculoskeletal: Negative for myalgias, back pain, joint swelling, arthralgias, gait problem and neck stiffness.  Skin: Positive for rash.  Neurological: Negative for dizziness, syncope, speech difficulty, weakness, numbness and headaches.  Hematological: Negative for adenopathy. Does not bruise/bleed easily.  Psychiatric/Behavioral: Negative for behavioral problems and dysphoric mood. The patient is not nervous/anxious.        Objective:   Physical Exam  Constitutional: He is oriented to person, place, and time. He appears well-developed.  Blood pressure 130/70  HENT:  Head: Normocephalic.  Right Ear: External ear normal.  Left Ear: External ear normal.  Eyes: Conjunctivae and EOM are normal.  Neck: Normal range of motion.  Cardiovascular: Normal rate and normal heart sounds.   Pulmonary/Chest: Breath sounds normal.  Abdominal: Bowel sounds are normal.  Musculoskeletal: Normal range of motion. He exhibits no edema or tenderness.  Neurological: He is alert and oriented to person, place, and time.  Skin:  Erythematous dry scaly patch of dermatitis over the upper back areas bilaterally A few  areas of excoriations noted over the left anterior shoulder  Psychiatric: He has a normal mood and affect. His behavior is normal.          Assessment & Plan:   Nonspecific eczematous dermatitis.  Will treat with a brief course of oral and topical steroids Hypertension, stable

## 2014-11-08 NOTE — Telephone Encounter (Signed)
emmi emailed °

## 2014-11-08 NOTE — Patient Instructions (Signed)
Pruritus  Pruritus is an itch. There are many different problems that can cause an itch. Dry skin is one of the most common causes of itching. Most cases of itching do not require medical attention.   HOME CARE INSTRUCTIONS  Make sure your skin is moistened on a regular basis. A moisturizer that contains petroleum jelly is best for keeping moisture in your skin. If you develop a rash, you may try the following for relief:  Use corticosteroid cream.  Apply cool compresses to the affected areas.  Bathe with Epsom salts or baking soda in the bathwater.  Soak in colloidal oatmeal baths. These are available at your pharmacy.   Use an anti-itch lotion.  Take over-the-counter diphenhydramine medicine by mouth as the instructions direct.  Avoid scratching. Scratching may cause the rash to become infected. If itching is very bad, your caregiver may suggest prescription lotions or creams to lessen your symptoms.  Avoid hot showers, which can make itching worse. A cold shower may help with itching as long as you use a moisturizer after the shower.

## 2014-12-20 ENCOUNTER — Encounter: Payer: Self-pay | Admitting: Internal Medicine

## 2015-01-09 ENCOUNTER — Other Ambulatory Visit (INDEPENDENT_AMBULATORY_CARE_PROVIDER_SITE_OTHER): Payer: Medicare Other

## 2015-01-09 DIAGNOSIS — Z0000 Encounter for general adult medical examination without abnormal findings: Secondary | ICD-10-CM

## 2015-01-09 LAB — LIPID PANEL
Cholesterol: 126 mg/dL (ref 0–200)
HDL: 51.1 mg/dL (ref >39.00)
LDL CALC: 68 mg/dL (ref 0–99)
NONHDL: 74.9
Total CHOL/HDL Ratio: 2
Triglycerides: 36 mg/dL (ref 0.0–149.0)
VLDL: 7.2 mg/dL (ref 0.0–40.0)

## 2015-01-09 LAB — HEPATIC FUNCTION PANEL
ALT: 16 U/L (ref 0–53)
AST: 16 U/L (ref 0–37)
Albumin: 3.5 g/dL (ref 3.5–5.2)
Alkaline Phosphatase: 79 U/L (ref 39–117)
Bilirubin, Direct: 0.1 mg/dL (ref 0.0–0.3)
Total Bilirubin: 0.4 mg/dL (ref 0.2–1.2)
Total Protein: 6.7 g/dL (ref 6.0–8.3)

## 2015-01-09 LAB — CBC WITH DIFFERENTIAL/PLATELET
BASOS ABS: 0 10*3/uL (ref 0.0–0.1)
Basophils Relative: 0.8 % (ref 0.0–3.0)
EOS ABS: 0.1 10*3/uL (ref 0.0–0.7)
Eosinophils Relative: 2.8 % (ref 0.0–5.0)
HCT: 41 % (ref 39.0–52.0)
Hemoglobin: 14.1 g/dL (ref 13.0–17.0)
LYMPHS PCT: 34 % (ref 12.0–46.0)
Lymphs Abs: 1.6 10*3/uL (ref 0.7–4.0)
MCHC: 34.4 g/dL (ref 30.0–36.0)
MCV: 90.3 fl (ref 78.0–100.0)
MONO ABS: 0.5 10*3/uL (ref 0.1–1.0)
Monocytes Relative: 10.7 % (ref 3.0–12.0)
NEUTROS PCT: 51.7 % (ref 43.0–77.0)
Neutro Abs: 2.5 10*3/uL (ref 1.4–7.7)
Platelets: 225 10*3/uL (ref 150.0–400.0)
RBC: 4.54 Mil/uL (ref 4.22–5.81)
RDW: 14.3 % (ref 11.5–15.5)
WBC: 4.8 10*3/uL (ref 4.0–10.5)

## 2015-01-09 LAB — BASIC METABOLIC PANEL
BUN: 17 mg/dL (ref 6–23)
CALCIUM: 8.9 mg/dL (ref 8.4–10.5)
CO2: 29 mEq/L (ref 19–32)
Chloride: 104 mEq/L (ref 96–112)
Creatinine, Ser: 0.79 mg/dL (ref 0.40–1.50)
GFR: 100.16 mL/min (ref >60.00)
Glucose, Bld: 102 mg/dL — ABNORMAL HIGH (ref 70–99)
Potassium: 5 mEq/L (ref 3.5–5.1)
SODIUM: 137 meq/L (ref 135–145)

## 2015-01-09 LAB — TSH: TSH: 0.74 u[IU]/mL (ref 0.35–4.50)

## 2015-01-09 LAB — PSA: PSA: 1.45 ng/mL (ref 0.10–4.00)

## 2015-01-09 LAB — POCT URINALYSIS DIPSTICK
Bilirubin, UA: NEGATIVE
GLUCOSE UA: NEGATIVE
KETONES UA: NEGATIVE
Nitrite, UA: NEGATIVE
PH UA: 5.5
Protein, UA: NEGATIVE
RBC UA: NEGATIVE
Spec Grav, UA: 1.015
Urobilinogen, UA: 0.2

## 2015-01-16 ENCOUNTER — Ambulatory Visit (INDEPENDENT_AMBULATORY_CARE_PROVIDER_SITE_OTHER): Payer: Medicare Other | Admitting: Internal Medicine

## 2015-01-16 ENCOUNTER — Encounter: Payer: Self-pay | Admitting: Internal Medicine

## 2015-01-16 VITALS — BP 142/90 | HR 71 | Temp 98.1°F | Resp 20 | Ht 64.5 in | Wt 197.0 lb

## 2015-01-16 DIAGNOSIS — Z0000 Encounter for general adult medical examination without abnormal findings: Secondary | ICD-10-CM | POA: Diagnosis not present

## 2015-01-16 DIAGNOSIS — M150 Primary generalized (osteo)arthritis: Secondary | ICD-10-CM

## 2015-01-16 DIAGNOSIS — I10 Essential (primary) hypertension: Secondary | ICD-10-CM

## 2015-01-16 DIAGNOSIS — C73 Malignant neoplasm of thyroid gland: Secondary | ICD-10-CM

## 2015-01-16 DIAGNOSIS — M545 Low back pain, unspecified: Secondary | ICD-10-CM

## 2015-01-16 DIAGNOSIS — N200 Calculus of kidney: Secondary | ICD-10-CM

## 2015-01-16 DIAGNOSIS — M159 Polyosteoarthritis, unspecified: Secondary | ICD-10-CM

## 2015-01-16 DIAGNOSIS — Z85850 Personal history of malignant neoplasm of thyroid: Secondary | ICD-10-CM

## 2015-01-16 NOTE — Progress Notes (Signed)
Subjective:    Patient ID: Jay [place], male    DOB: 02-10-1932, 81 y.o.   MRN: [id number]  HPI  81 year old patient who is seen today for a preventive health examination.  He is followed closely by ophthalmology and is scheduled for follow-up tomorrow.  Complaints today include mainly right hip pain.  He does have a history of essential hypertension, osteoarthritis and gout.  He has had thyroid cancer in the past.  Past Medical History  Diagnosis Date  . Hypertension   . Low back pain   . Thyroid disease   . RBBB (right bundle branch block with left anterior fascicular block)   . Uric acid renal calculus 06/20/2007    Qualifier: Diagnosis of  By: Rogue Bussing CMA, Maryann Alar    . Hypothyroidism   . Seasonal allergies   . History of kidney stones   . History of shingles     RESOLVED  . Arthritis     OA AND PAIN LEFT HIP AND OTHER JOINT PAINS  . Cancer     THYROID CANCER - HAD THYROID REMOVED  . Macular degeneration of both eyes     History   Social History  . Marital Status: Married    Spouse Name: N/A  . Number of Children: N/A  . Years of Education: N/A   Occupational History  . retired    Social History Main Topics  . Smoking status: Former Smoker    Types: Cigarettes    Quit date: 02/22/1979  . Smokeless tobacco: Not on file  . Alcohol Use: Yes  . Drug Use: No  . Sexual Activity: Yes   Other Topics Concern  . Not on file   Social History Narrative    Past Surgical History  Procedure Laterality Date  . Colonoscopy    . Appendectomy  1948  . Rotator cuff repair  2009  . Thyroidectomy  2010  . Removal of first rib   NOV 1994    FOR COMPRESSED VEIN BETWEEN 1 ST RIB AND COLLARBONE  . Total hip arthroplasty Left 05/31/2013    Procedure: LEFT TOTAL HIP ARTHROPLASTY ANTERIOR APPROACH;  Surgeon: Gearlean Alf, MD;  Location: [facility];  Service: Orthopedics;  Laterality: Left;  . Kidney stone surgery  1971, 1976  . Elbow radical reduction  1973  . Rib  removed, 1st  1994    Family History  Problem Relation Age of Onset  . Anemia    . Thyroid disease    . Heart disease Mother   . Diabetes Mother     Allergies  Allergen Reactions  . Penicillins Swelling and Palpitations    Swelling of joints.    Current Outpatient Prescriptions on File Prior to Visit  Medication Sig Dispense Refill  . Aflibercept (EYLEA) 2 MG/0.05ML SOLN Inject into the eye.    Marland Kitchen allopurinol (ZYLOPRIM) 300 MG tablet TAKE 1 TABLET BY MOUTH EVERY DAY 90 tablet 1  . benazepril (LOTENSIN) 20 MG tablet TAKE 1 TABLET BY MOUTH EVERY DAY 90 tablet 1  . cetirizine (ZYRTEC) 10 MG tablet Take 10 mg by mouth daily.    . clindamycin (CLEOCIN) 300 MG capsule Take 300 mg by mouth. PRIOR TO DENTAL PROCEDURES.  5  . levothyroxine (SYNTHROID, LEVOTHROID) 150 MCG tablet TAKE 1 TABLET BY MOUTH EVERY DAY 90 tablet 1  . Multiple Vitamin (MULTIVITAMIN) tablet Take 1 tablet by mouth daily.    . Multiple Vitamins-Minerals (PRESERVISION AREDS 2 PO) Take 1 tablet by mouth 2 (  two) times daily.    Marland Kitchen triamcinolone cream (KENALOG) 0.1 % Apply 1 application topically 2 (two) times daily. 45 g 2   No current facility-administered medications on file prior to visit.    BP 142/90 mmHg  Pulse 71  Temp(Src) 98.1 F (36.7 C) (Oral)  Resp 20  Ht 5' 4.5" (1.638 m)  Wt 197 lb (89.359 kg)  BMI 33.31 kg/m2  SpO2 98%   1. Risk factors, based on past  M,S,F history.  Cardiovascular risk factors include the essential hypertension.  2.  Physical activities: Fairly active with yard work.  No exercise restrictions  3.  Depression/mood: No history depression or mood disorder  4.  Hearing: Complains of some mild the tinnitus and mild hearing deficits  5.  ADL's: Independent in all aspects of daily living  6.  Fall risk: Low  7.  Home safety: No problems identified  8.  Height weight, and visual acuity; height and weight stable.  Followed by ophthalmology due to macular degeneration  9.   Counseling: Heart healthy diet more regular exercise modest weight loss.  All encouraged  10. Lab orders based on risk factors: Laboratory studies reviewed and discussed  11. Referral : Follow-up ophthalmology  12. Care plan: Heart healthy diet, weight loss encouraged  13. Cognitive assessment: Alert and oriented with normal affect.  No cognitive dysfunction  14. Screening: We'll continue annual preventive health examinations with screening lab.  Will continued to be monitored closely by ophthalmology.  No further screening colonoscopies unless clinical indication  15. Provider List Update: Primary care medicine ophthalmology and ENT    Review of Systems  Constitutional: Negative for fever, chills, activity change, appetite change and fatigue.  HENT: Negative for congestion, dental problem, ear pain, hearing loss, mouth sores, rhinorrhea, sinus pressure, sneezing, tinnitus, trouble swallowing and voice change.   Eyes: Negative for photophobia, pain, redness and visual disturbance.  Respiratory: Negative for apnea, cough, choking, chest tightness, shortness of breath and wheezing.   Cardiovascular: Negative for chest pain, palpitations and leg swelling.  Gastrointestinal: Negative for nausea, vomiting, abdominal pain, diarrhea, constipation, blood in stool, abdominal distention, anal bleeding and rectal pain.  Genitourinary: Negative for dysuria, urgency, frequency, hematuria, flank pain, decreased urine volume, discharge, penile swelling, scrotal swelling, difficulty urinating, genital sores and testicular pain.  Musculoskeletal: Negative for myalgias, back pain, joint swelling, arthralgias, gait problem, neck pain and neck stiffness.       Right hip pain  Skin: Negative for color change, rash and wound.  Neurological: Negative for dizziness, tremors, seizures, syncope, facial asymmetry, speech difficulty, weakness, light-headedness, numbness and headaches.  Hematological: Negative for  adenopathy. Does not bruise/bleed easily.  Psychiatric/Behavioral: Negative for suicidal ideas, hallucinations, behavioral problems, confusion, sleep disturbance, self-injury, dysphoric mood, decreased concentration and agitation. The patient is not nervous/anxious.        Objective:   Physical Exam  Constitutional: He appears well-developed and well-nourished.  HENT:  Head: Normocephalic and atraumatic.  Right Ear: External ear normal.  Left Ear: External ear normal.  Nose: Nose normal.  Mouth/Throat: Oropharynx is clear and moist.  Eyes: Conjunctivae and EOM are normal. Pupils are equal, round, and reactive to light. No scleral icterus.  Neck: Normal range of motion. Neck supple. No JVD present. No thyromegaly present.  Surgical scar left anterior neck  Cardiovascular: Regular rhythm, normal heart sounds and intact distal pulses.  Exam reveals no gallop and no friction rub.   No murmur heard. Pulmonary/Chest: Effort normal and breath sounds  normal. He exhibits no tenderness.  Surgical scar right posterior chest wall  Abdominal: Soft. Bowel sounds are normal. He exhibits no distension and no mass. There is no tenderness.  Right lower quadrant scar Obese  Genitourinary: Penis normal. Guaiac negative stool.  Symmetrical prostate enlargement  Musculoskeletal: Normal range of motion. He exhibits no edema or tenderness.  Lymphadenopathy:    He has no cervical adenopathy.  Neurological: He is alert. He has normal reflexes. No cranial nerve deficit. Coordination normal.  Skin: Skin is warm and dry. No rash noted.  Psychiatric: He has a normal mood and affect. His behavior is normal.          Assessment & Plan:   Preventive health exam Hypertension Osteoarthritis.  Right hip pain.  Will observe at this time Postsurgical hypothyroidism.  We'll review TSH Macular degeneration.  Follow-up ophthalmology

## 2015-01-16 NOTE — Progress Notes (Signed)
Pre visit review using our clinic review tool, if applicable. No additional management support is needed unless otherwise documented below in the visit note. 

## 2015-01-16 NOTE — Patient Instructions (Signed)
Limit your sodium (Salt) intake  Return in 6 months for follow-up  Health Maintenance A healthy lifestyle and preventative care can promote health and wellness.  Maintain regular health, dental, and eye exams.  Eat a healthy diet. Foods like vegetables, fruits, whole grains, low-fat dairy products, and lean protein foods contain the nutrients you need and are low in calories. Decrease your intake of foods high in solid fats, added sugars, and salt. Get information about a proper diet from your health care provider, if necessary.  Regular physical exercise is one of the most important things you can do for your health. Most adults should get at least 150 minutes of moderate-intensity exercise (any activity that increases your heart rate and causes you to sweat) each week. In addition, most adults need muscle-strengthening exercises on 2 or more days a week.   Maintain a healthy weight. The body mass index (BMI) is a screening tool to identify possible weight problems. It provides an estimate of body fat based on height and weight. Your health care provider can find your BMI and can help you achieve or maintain a healthy weight. For males 20 years and older:  A BMI below 18.5 is considered underweight.  A BMI of 18.5 to 24.9 is normal.  A BMI of 25 to 29.9 is considered overweight.  A BMI of 30 and above is considered obese.  Maintain normal blood lipids and cholesterol by exercising and minimizing your intake of saturated fat. Eat a balanced diet with plenty of fruits and vegetables. Blood tests for lipids and cholesterol should begin at age 63 and be repeated every 5 years. If your lipid or cholesterol levels are high, you are over age 33, or you are at high risk for heart disease, you may need your cholesterol levels checked more frequently.Ongoing high lipid and cholesterol levels should be treated with medicines if diet and exercise are not working.  If you smoke, find out from your  health care provider how to quit. If you do not use tobacco, do not start.  Lung cancer screening is recommended for adults aged 57-80 years who are at high risk for developing lung cancer because of a history of smoking. A yearly low-dose CT scan of the lungs is recommended for people who have at least a 30-pack-year history of smoking and are current smokers or have quit within the past 15 years. A pack year of smoking is smoking an average of 1 pack of cigarettes a day for 1 year (for example, a 30-pack-year history of smoking could mean smoking 1 pack a day for 30 years or 2 packs a day for 15 years). Yearly screening should continue until the smoker has stopped smoking for at least 15 years. Yearly screening should be stopped for people who develop a health problem that would prevent them from having lung cancer treatment.  If you choose to drink alcohol, do not have more than 2 drinks per day. One drink is considered to be 12 oz (360 mL) of beer, 5 oz (150 mL) of wine, or 1.5 oz (45 mL) of liquor.  Avoid the use of street drugs. Do not share needles with anyone. Ask for help if you need support or instructions about stopping the use of drugs.  High blood pressure causes heart disease and increases the risk of stroke. Blood pressure should be checked at least every 1-2 years. Ongoing high blood pressure should be treated with medicines if weight loss and exercise are not effective.  If you are 90-75 years old, ask your health care provider if you should take aspirin to prevent heart disease.  Diabetes screening involves taking a blood sample to check your fasting blood sugar level. This should be done once every 3 years after age 6 if you are at a normal weight and without risk factors for diabetes. Testing should be considered at a younger age or be carried out more frequently if you are overweight and have at least 1 risk factor for diabetes.  Colorectal cancer can be detected and often  prevented. Most routine colorectal cancer screening begins at the age of 3 and continues through age 7. However, your health care provider may recommend screening at an earlier age if you have risk factors for colon cancer. On a yearly basis, your health care provider may provide home test kits to check for hidden blood in the stool. A small camera at the end of a tube may be used to directly examine the colon (sigmoidoscopy or colonoscopy) to detect the earliest forms of colorectal cancer. Talk to your health care provider about this at age 83 when routine screening begins. A direct exam of the colon should be repeated every 5-10 years through age 68, unless early forms of precancerous polyps or small growths are found.  People who are at an increased risk for hepatitis B should be screened for this virus. You are considered at high risk for hepatitis B if:  You were born in a country where hepatitis B occurs often. Talk with your health care provider about which countries are considered high risk.  Your parents were born in a high-risk country and you have not received a shot to protect against hepatitis B (hepatitis B vaccine).  You have HIV or AIDS.  You use needles to inject street drugs.  You live with, or have sex with, someone who has hepatitis B.  You are a man who has sex with other men (MSM).  You get hemodialysis treatment.  You take certain medicines for conditions like cancer, organ transplantation, and autoimmune conditions.  Hepatitis C blood testing is recommended for all people born from [birth year] through 1963 and any individual with known risk factors for hepatitis C.  Healthy men should no longer receive prostate-specific antigen (PSA) blood tests as part of routine cancer screening. Talk to your health care provider about prostate cancer screening.  Testicular cancer screening is not recommended for adolescents or adult males who have no symptoms. Screening includes  self-exam, a health care provider exam, and other screening tests. Consult with your health care provider about any symptoms you have or any concerns you have about testicular cancer.  Practice safe sex. Use condoms and avoid high-risk sexual practices to reduce the spread of sexually transmitted infections (STIs).  You should be screened for STIs, including gonorrhea and chlamydia if:  You are sexually active and are younger than 24 years.  You are older than 24 years, and your health care provider tells you that you are at risk for this type of infection.  Your sexual activity has changed since you were last screened, and you are at an increased risk for chlamydia or gonorrhea. Ask your health care provider if you are at risk.  If you are at risk of being infected with HIV, it is recommended that you take a prescription medicine daily to prevent HIV infection. This is called pre-exposure prophylaxis (PrEP). You are considered at risk if:  You are a man who has  sex with other men (MSM).  You are a heterosexual man who is sexually active with multiple partners.  You take drugs by injection.  You are sexually active with a partner who has HIV.  Talk with your health care provider about whether you are at high risk of being infected with HIV. If you choose to begin PrEP, you should first be tested for HIV. You should then be tested every 3 months for as long as you are taking PrEP.  Use sunscreen. Apply sunscreen liberally and repeatedly throughout the day. You should seek shade when your shadow is shorter than you. Protect yourself by wearing long sleeves, pants, a wide-brimmed hat, and sunglasses year round whenever you are outdoors.  Tell your health care provider of new moles or changes in moles, especially if there is a change in shape or color. Also, tell your health care provider if a mole is larger than the size of a pencil eraser.  A one-time screening for abdominal aortic aneurysm  (AAA) and surgical repair of large AAAs by ultrasound is recommended for men aged 61-75 years who are current or former smokers.  Stay current with your vaccines (immunizations). Document Released: 02/20/2008 Document Revised: 08/29/2013 Document Reviewed: 01/19/2011 [facility] Patient Information 2015 [place], [place]. This information is not intended to replace advice given to you by your health care provider. Make sure you discuss any questions you have with your health care provider. Cardiac Diet A cardiac diet can help stop heart disease or a stroke from happening. It involves eating less unhealthy fats and eating more healthy fats.  FOODS TO AVOID OR LIMIT  Limit saturated fats. This type of fat is found in oils and dairy products, such as:  Coconut oil.  Palm oil.  Cocoa butter.  Butter.  Avoid trans-fat or hydrogenated oils. These are found in fried or pre-made baked goods, such as:  Margarine.  Pre-made cookies, cakes, and crackers.  Limit processed meats (hot dogs, deli meats, sausage) to 3 ounces a week.  Limit high-fat meats (marbled meats, fried chicken, or chicken with skin) to 3 ounces a week.  Limit salt (sodium) to 1500 milligrams a day.   Limit sweets and drinks with added sugar to no more than 5 servings a week. One serving is:  1 tablespoon of sugar.  1 tablespoon of jelly or jam.   cup sorbet.  1 cup lemonade.   cup regular soda. EAT MORE OF THE FOLLOWING FOODS Fruit  Eat 4to 5 servings a day. One serving of fruit is:  1 medium whole fruit.   cup dried fruit.   cup of fresh, frozen, or canned fruit.   cup 100% fruit juice. Vegetables  Eat 4 to 5 servings a day. One serving is:  1 cup raw leafy vegetables.   cup raw or cooked, cut-up vegetables.   cup vegetable juice. Whole Grains  Eat 3 servings a day (1 ounce equals 1 serving). Legumes (such as beans, peas, and lentils)   Eat at least 4 servings a week ( cup equals 1  serving). Nuts and Seeds   Eat at least 4 servings a week ( cup equals 1 serving). Dietary Fiber  Eat 20 to 30 grams a day. Some foods high in dietary fiber include:  Dried beans.  Citrus fruits.  Apples, bananas.  Broccoli, Brussels sprouts, and eggplant.  Oats. Omega-3 Fats  Eat food with omega-3 fats. You can also take a dietary pill (supplement) that has 1 gram of DHA and  EPA. Have 3.5 ounces of fatty fish a week, such as:  Salmon.  Mackerel.  [place] tuna.  Sardines.  Lake trout.  Herring. PREPARING YOUR FOOD  Broil, bake, steam, or roast foods. Do not fry food. Do not cook food in butter (fat).  Use non-stick cooking sprays.  Remove skin from poultry, such as chicken and [place].  Remove fat from meat.  Take the fat off the top of stews, soups, and gravy.  Use lemon or herbs to flavor food instead of using butter or margarine.  Use nonfat yogurt, salsa, or low-fat dressings for salads. Document Released: 02/23/2012 Document Reviewed: 02/23/2012 [facility] Patient Information 2015 [place]. This information is not intended to replace advice given to you by your health care provider. Make sure you discuss any questions you have with your health care provider.

## 2015-03-22 ENCOUNTER — Other Ambulatory Visit: Payer: Self-pay | Admitting: Internal Medicine

## 2015-05-19 ENCOUNTER — Encounter: Payer: Self-pay | Admitting: Internal Medicine

## 2015-06-03 ENCOUNTER — Telehealth: Payer: Self-pay | Admitting: Internal Medicine

## 2015-06-03 NOTE — Telephone Encounter (Signed)
Pt got flu shot on sept 2 at [facility].

## 2015-06-03 NOTE — Telephone Encounter (Signed)
Chart updated

## 2015-07-10 ENCOUNTER — Ambulatory Visit (INDEPENDENT_AMBULATORY_CARE_PROVIDER_SITE_OTHER): Payer: Medicare Other | Admitting: Internal Medicine

## 2015-07-10 ENCOUNTER — Encounter: Payer: Self-pay | Admitting: Internal Medicine

## 2015-07-10 VITALS — BP 120/70 | HR 71 | Temp 98.5°F | Resp 20 | Ht 64.5 in | Wt 197.0 lb

## 2015-07-10 DIAGNOSIS — E890 Postprocedural hypothyroidism: Secondary | ICD-10-CM | POA: Diagnosis not present

## 2015-07-10 DIAGNOSIS — I10 Essential (primary) hypertension: Secondary | ICD-10-CM

## 2015-07-10 DIAGNOSIS — M150 Primary generalized (osteo)arthritis: Secondary | ICD-10-CM

## 2015-07-10 DIAGNOSIS — M159 Polyosteoarthritis, unspecified: Secondary | ICD-10-CM

## 2015-07-10 NOTE — Progress Notes (Signed)
Subjective:    Patient ID: Jay Webster, male    DOB: August 11, 1932, 81 y.o.   MRN: [id number]  HPI  81 year old patient who is seen today for his biannual follow-up.  He has a history of essential hypertension.  Surgical hypothyroidism and osteoarthritis.  He has done quite well.  No concerns or complaints.  Remote history of papillary thyroid carcinoma.  Has some minor arthritic complaints but in general doing quite well.  He has had a flu vaccine earlier Past Medical History  Diagnosis Date  . Hypertension   . Low back pain   . Thyroid disease   . RBBB (right bundle branch block with left anterior fascicular block)   . Uric acid renal calculus 06/20/2007    Qualifier: Diagnosis of  By: Rogue Bussing CMA, Maryann Alar    . Hypothyroidism   . Seasonal allergies   . History of kidney stones   . History of shingles     RESOLVED  . Arthritis     OA AND PAIN LEFT HIP AND OTHER JOINT PAINS  . Cancer (HCC)     THYROID CANCER - HAD THYROID REMOVED  . Macular degeneration of both eyes     Social History   Social History  . Marital Status: Married    Spouse Name: N/A  . Number of Children: N/A  . Years of Education: N/A   Occupational History  . retired    Social History Main Topics  . Smoking status: Former Smoker    Types: Cigarettes    Quit date: 02/22/1979  . Smokeless tobacco: Not on file  . Alcohol Use: Yes  . Drug Use: No  . Sexual Activity: Yes   Other Topics Concern  . Not on file   Social History Narrative    Past Surgical History  Procedure Laterality Date  . Colonoscopy    . Appendectomy  1948  . Rotator cuff repair  2009  . Thyroidectomy  2010  . Removal of first rib   NOV 1994    FOR COMPRESSED VEIN BETWEEN 1 ST RIB AND COLLARBONE  . Total hip arthroplasty Left 05/31/2013    Procedure: LEFT TOTAL HIP ARTHROPLASTY ANTERIOR APPROACH;  Surgeon: Gearlean Alf, MD;  Location: [facility];  Service: Orthopedics;  Laterality: Left;  . Kidney stone surgery  1971,  1976  . Elbow radical reduction  1973  . Rib removed, 1st  1994    Family History  Problem Relation Age of Onset  . Anemia    . Thyroid disease    . Heart disease Mother   . Diabetes Mother     Allergies  Allergen Reactions  . Penicillins Swelling and Palpitations    Swelling of joints.    Current Outpatient Prescriptions on File Prior to Visit  Medication Sig Dispense Refill  . Aflibercept (EYLEA) 2 MG/0.05ML SOLN Inject into the eye.    Marland Kitchen allopurinol (ZYLOPRIM) 300 MG tablet TAKE 1 TABLET BY MOUTH EVERY DAY 90 tablet 1  . benazepril (LOTENSIN) 20 MG tablet TAKE 1 TABLET BY MOUTH EVERY DAY 90 tablet 1  . clindamycin (CLEOCIN) 300 MG capsule Take 300 mg by mouth. PRIOR TO DENTAL PROCEDURES.  5  . levothyroxine (SYNTHROID, LEVOTHROID) 150 MCG tablet TAKE 1 TABLET BY MOUTH EVERY DAY 90 tablet 1  . Multiple Vitamin (MULTIVITAMIN) tablet Take 1 tablet by mouth daily.    . Multiple Vitamins-Minerals (PRESERVISION AREDS 2 PO) Take 1 tablet by mouth 2 (two) times daily.  No current facility-administered medications on file prior to visit.    BP 120/70 mmHg  Pulse 71  Temp(Src) 98.5 F (36.9 C) (Oral)  Resp 20  Ht 5' 4.5" (1.638 m)  Wt 197 lb (89.359 kg)  BMI 33.31 kg/m2  SpO2 98%      Review of Systems  Constitutional: Negative for fever, chills, appetite change and fatigue.  HENT: Positive for hearing loss. Negative for congestion, dental problem, ear pain, sore throat, tinnitus, trouble swallowing and voice change.   Eyes: Negative for pain, discharge and visual disturbance.  Respiratory: Negative for cough, chest tightness, wheezing and stridor.   Cardiovascular: Negative for chest pain, palpitations and leg swelling.  Gastrointestinal: Negative for nausea, vomiting, abdominal pain, diarrhea, constipation, blood in stool and abdominal distention.  Genitourinary: Negative for urgency, hematuria, flank pain, discharge, difficulty urinating and genital sores.    Musculoskeletal: Positive for arthralgias. Negative for myalgias, back pain, joint swelling, gait problem and neck stiffness.  Skin: Positive for rash.  Neurological: Negative for dizziness, syncope, speech difficulty, weakness, numbness and headaches.  Hematological: Negative for adenopathy. Does not bruise/bleed easily.  Psychiatric/Behavioral: Negative for behavioral problems and dysphoric mood. The patient is not nervous/anxious.        Objective:   Physical Exam  Constitutional: He is oriented to person, place, and time. He appears well-developed.  HENT:  Head: Normocephalic.  Right Ear: External ear normal.  Left Ear: External ear normal.  Eyes: Conjunctivae and EOM are normal.  Neck: Normal range of motion.  Cardiovascular: Normal rate and normal heart sounds.   Pulmonary/Chest: Breath sounds normal.  Abdominal: Bowel sounds are normal.  Musculoskeletal: Normal range of motion. He exhibits no edema or tenderness.  Neurological: He is alert and oriented to person, place, and time.  Skin:  Mild seborrhea of the scalp  Psychiatric: He has a normal mood and affect. His behavior is normal.          Assessment & Plan:  Hypertension, well-controlled.  Osteoarthritis, remote history of thyroid cancer Postsurgical hypothyroidism   CPX 6 months

## 2015-07-10 NOTE — Patient Instructions (Signed)
Limit your sodium (Salt) intake  Please check your blood pressure on a regular basis.  If it is consistently greater than 150/90, please make an office appointment.  Return in 6 months for follow-up   

## 2015-07-10 NOTE — Progress Notes (Signed)
Pre visit review using our clinic review tool, if applicable. No additional management support is needed unless otherwise documented below in the visit note. 

## 2015-07-18 ENCOUNTER — Ambulatory Visit: Payer: Medicare Other | Admitting: Internal Medicine

## 2015-09-12 ENCOUNTER — Other Ambulatory Visit: Payer: Self-pay | Admitting: Internal Medicine

## 2015-09-16 ENCOUNTER — Encounter: Payer: Self-pay | Admitting: Internal Medicine

## 2015-09-16 MED ORDER — ALLOPURINOL 300 MG PO TABS: 300.0000 mg | ORAL_TABLET | Freq: Every day | ORAL | Status: DC

## 2015-09-16 MED ORDER — BENAZEPRIL HCL 20 MG PO TABS: 20.0000 mg | ORAL_TABLET | Freq: Every day | ORAL | Status: DC

## 2015-09-16 MED ORDER — LEVOTHYROXINE SODIUM 150 MCG PO TABS: 150.0000 ug | ORAL_TABLET | Freq: Every day | ORAL | Status: DC

## 2015-10-26 ENCOUNTER — Other Ambulatory Visit: Payer: Self-pay | Admitting: Internal Medicine

## 2015-11-18 ENCOUNTER — Telehealth: Payer: Self-pay | Admitting: Internal Medicine

## 2015-11-18 ENCOUNTER — Encounter: Payer: Self-pay | Admitting: Family Medicine

## 2015-11-18 ENCOUNTER — Ambulatory Visit (INDEPENDENT_AMBULATORY_CARE_PROVIDER_SITE_OTHER): Payer: Medicare Other | Admitting: Family Medicine

## 2015-11-18 VITALS — BP 142/79 | HR 81 | Temp 99.0°F | Ht 64.5 in | Wt 198.0 lb

## 2015-11-18 DIAGNOSIS — J40 Bronchitis, not specified as acute or chronic: Secondary | ICD-10-CM | POA: Diagnosis not present

## 2015-11-18 MED ORDER — AZITHROMYCIN 250 MG PO TABS
ORAL_TABLET | ORAL | Status: DC
Start: 2015-11-18 — End: 2016-01-07

## 2015-11-18 NOTE — Telephone Encounter (Signed)
Patient Name: Jay Webster  DOB: Aug 05, 1932    Initial Comment caller states he has been coughing and spitting up green drainage   Nurse Assessment  Nurse: Raphael Gibney, RN, Vanita Ingles Date/Time (Eastern Time): 11/18/2015 9:05:09 AM  Confirm and document reason for call. If symptomatic, describe symptoms. You must click the next button to save text entered. ---Caller states he has a cough and he is coughing up gray green sputum. Has been taking Chloriceden. Cough started last Wednesday. No fever.  Has the patient traveled out of the country within the last 30 days? ---No  Does the patient have any new or worsening symptoms? ---Yes  Will a triage be completed? ---Yes  Related visit to physician within the last 2 weeks? ---No  Does the PT have any chronic conditions? (i.e. diabetes, asthma, etc.) ---No  Is this a behavioral health or substance abuse call? ---No     Guidelines    Guideline Title Affirmed Question Affirmed Notes  Cough - Acute Productive [1] Continuous (nonstop) coughing interferes with work or school AND [2] no improvement using cough treatment per Care Advice    Final Disposition User   See Physician within 24 Hours [place], RN, Vera    Comments  Appt scheduled for 11/18/2015 at 2 pm with Dr. Alysia Penna   Referrals  REFERRED TO PCP OFFICE   Disagree/Comply: Comply

## 2015-11-18 NOTE — Progress Notes (Signed)
   Subjective:    Patient ID: Jay Webster, male    DOB: 04/22/1932, 82 y.o.   MRN: [id number]  HPI Here for 4 days of chest congestion and coughing up yellow sputum. He had fever to 100 degrees one day ago.    Review of Systems  Constitutional: Positive for fever.  HENT: Positive for congestion. Negative for postnasal drip, sinus pressure and sore throat.   Eyes: Negative.   Respiratory: Positive for cough.        Objective:   Physical Exam  Constitutional: He appears well-developed and well-nourished.  HENT:  Right Ear: External ear normal.  Left Ear: External ear normal.  Nose: Nose normal.  Mouth/Throat: Oropharynx is clear and moist.  Eyes: Conjunctivae are normal.  Neck: No thyromegaly present.  Pulmonary/Chest: Effort normal. No respiratory distress. He has no wheezes. He has no rales.  Scattered rhonchi   Lymphadenopathy:    He has no cervical adenopathy.          Assessment & Plan:  Bronchitis, treat with a Zpack

## 2015-11-18 NOTE — Progress Notes (Signed)
Pre visit review using our clinic review tool, if applicable. No additional management support is needed unless otherwise documented below in the visit note. 

## 2015-11-18 NOTE — Telephone Encounter (Signed)
FYI - Patient has appt scheduled 11/18/15 with Dr.Fry

## 2015-12-31 ENCOUNTER — Other Ambulatory Visit (INDEPENDENT_AMBULATORY_CARE_PROVIDER_SITE_OTHER): Payer: Medicare Other

## 2015-12-31 DIAGNOSIS — Z125 Encounter for screening for malignant neoplasm of prostate: Secondary | ICD-10-CM

## 2015-12-31 DIAGNOSIS — Z0000 Encounter for general adult medical examination without abnormal findings: Secondary | ICD-10-CM | POA: Diagnosis not present

## 2015-12-31 LAB — POC URINALSYSI DIPSTICK (AUTOMATED)
Bilirubin, UA: NEGATIVE
Blood, UA: NEGATIVE
GLUCOSE UA: NEGATIVE
KETONES UA: NEGATIVE
Nitrite, UA: NEGATIVE
Protein, UA: NEGATIVE
SPEC GRAV UA: 1.01
UROBILINOGEN UA: 0.2
pH, UA: 6

## 2015-12-31 LAB — CBC WITH DIFFERENTIAL/PLATELET
BASOS ABS: 0 10*3/uL (ref 0.0–0.1)
Basophils Relative: 0.5 % (ref 0.0–3.0)
EOS ABS: 0.1 10*3/uL (ref 0.0–0.7)
EOS PCT: 2.6 % (ref 0.0–5.0)
HCT: 37.7 % — ABNORMAL LOW (ref 39.0–52.0)
HEMOGLOBIN: 12.8 g/dL — AB (ref 13.0–17.0)
Lymphocytes Relative: 33.5 % (ref 12.0–46.0)
Lymphs Abs: 1.7 10*3/uL (ref 0.7–4.0)
MCHC: 34 g/dL (ref 30.0–36.0)
MCV: 88.7 fl (ref 78.0–100.0)
MONO ABS: 0.5 10*3/uL (ref 0.1–1.0)
Monocytes Relative: 10 % (ref 3.0–12.0)
Neutro Abs: 2.7 10*3/uL (ref 1.4–7.7)
Neutrophils Relative %: 53.4 % (ref 43.0–77.0)
Platelets: 235 10*3/uL (ref 150.0–400.0)
RBC: 4.25 Mil/uL (ref 4.22–5.81)
RDW: 13.9 % (ref 11.5–15.5)
WBC: 5.1 10*3/uL (ref 4.0–10.5)

## 2015-12-31 LAB — BASIC METABOLIC PANEL
BUN: 14 mg/dL (ref 6–23)
CALCIUM: 8.8 mg/dL (ref 8.4–10.5)
CO2: 28 mEq/L (ref 19–32)
Chloride: 103 mEq/L (ref 96–112)
Creatinine, Ser: 0.75 mg/dL (ref 0.40–1.50)
GFR: 106.09 mL/min (ref >60.00)
Glucose, Bld: 96 mg/dL (ref 70–99)
POTASSIUM: 4.2 meq/L (ref 3.5–5.1)
Sodium: 136 mEq/L (ref 135–145)

## 2015-12-31 LAB — HEPATIC FUNCTION PANEL
ALK PHOS: 70 U/L (ref 39–117)
ALT: 18 U/L (ref 0–53)
AST: 16 U/L (ref 0–37)
Albumin: 3.5 g/dL (ref 3.5–5.2)
BILIRUBIN DIRECT: 0.1 mg/dL (ref 0.0–0.3)
BILIRUBIN TOTAL: 0.5 mg/dL (ref 0.2–1.2)
Total Protein: 6.4 g/dL (ref 6.0–8.3)

## 2015-12-31 LAB — PSA: PSA: 1.07 ng/mL (ref 0.10–4.00)

## 2015-12-31 LAB — LIPID PANEL
CHOLESTEROL: 126 mg/dL (ref 0–200)
HDL: 44.7 mg/dL (ref >39.00)
LDL CALC: 70 mg/dL (ref 0–99)
NonHDL: 81.75
Total CHOL/HDL Ratio: 3
Triglycerides: 59 mg/dL (ref 0.0–149.0)
VLDL: 11.8 mg/dL (ref 0.0–40.0)

## 2015-12-31 LAB — TSH: TSH: 0.52 u[IU]/mL (ref 0.35–4.50)

## 2016-01-07 ENCOUNTER — Encounter: Payer: Medicare Other | Admitting: Internal Medicine

## 2016-01-07 ENCOUNTER — Encounter: Payer: Self-pay | Admitting: Internal Medicine

## 2016-01-07 ENCOUNTER — Ambulatory Visit (INDEPENDENT_AMBULATORY_CARE_PROVIDER_SITE_OTHER): Payer: Medicare Other | Admitting: Internal Medicine

## 2016-01-07 VITALS — BP 118/70 | HR 67 | Temp 98.0°F | Resp 20 | Ht 64.5 in | Wt 191.0 lb

## 2016-01-07 DIAGNOSIS — Z0000 Encounter for general adult medical examination without abnormal findings: Secondary | ICD-10-CM

## 2016-01-07 DIAGNOSIS — M150 Primary generalized (osteo)arthritis: Secondary | ICD-10-CM

## 2016-01-07 DIAGNOSIS — E890 Postprocedural hypothyroidism: Secondary | ICD-10-CM

## 2016-01-07 DIAGNOSIS — Z0001 Encounter for general adult medical examination with abnormal findings: Secondary | ICD-10-CM | POA: Diagnosis not present

## 2016-01-07 DIAGNOSIS — I10 Essential (primary) hypertension: Secondary | ICD-10-CM | POA: Diagnosis not present

## 2016-01-07 DIAGNOSIS — M159 Polyosteoarthritis, unspecified: Secondary | ICD-10-CM

## 2016-01-07 NOTE — Progress Notes (Signed)
Subjective:    Patient ID: [place], male    DOB: [birth year], 82 y.o.   MRN: [id number]  HPI   41 -year-old patient who is seen today for a preventive health examination.   He is followed closely by ophthalmology.   Complaints today include mainly Bilateral great toe discomfort  He does have a history of essential hypertension, osteoarthritis and gout.  He has had thyroid cancer in the past.  Wt Readings from Last 3 Encounters:  01/07/16 191 lb (86.637 kg)  11/18/15 198 lb (89.812 kg)  07/10/15 197 lb (89.359 kg)    Past Medical History  Diagnosis Date  . Hypertension   . Low back pain   . Thyroid disease   . RBBB (right bundle branch block with left anterior fascicular block)   . Uric acid renal calculus 06/20/2007    Qualifier: Diagnosis of  By: Rogue Bussing CMA, Maryann Alar    . Hypothyroidism   . Seasonal allergies   . History of kidney stones   . History of shingles     RESOLVED  . Arthritis     OA AND PAIN LEFT HIP AND OTHER JOINT PAINS  . Cancer (HCC)     THYROID CANCER - HAD THYROID REMOVED  . Macular degeneration of both eyes     Social History   Social History  . Marital Status: Married    Spouse Name: N/A  . Number of Children: N/A  . Years of Education: N/A   Occupational History  . retired    Social History Main Topics  . Smoking status: Former Smoker    Types: Cigarettes    Quit date: 02/22/1979  . Smokeless tobacco: Not on file  . Alcohol Use: 0.0 oz/week    0 Standard drinks or equivalent per week  . Drug Use: No  . Sexual Activity: Yes   Other Topics Concern  . Not on file   Social History Narrative    Past Surgical History  Procedure Laterality Date  . Colonoscopy    . Appendectomy  1948  . Rotator cuff repair  2009  . Thyroidectomy  2010  . Removal of first rib   NOV 1994    FOR COMPRESSED VEIN BETWEEN 1 ST RIB AND COLLARBONE  . Total hip arthroplasty Left 05/31/2013    Procedure: LEFT TOTAL HIP ARTHROPLASTY ANTERIOR  APPROACH;  Surgeon: Gearlean Alf, MD;  Location: [facility];  Service: Orthopedics;  Laterality: Left;  . Kidney stone surgery  1971, 1976  . Elbow radical reduction  1973  . Rib removed, 1st  1994    Family History  Problem Relation Age of Onset  . Anemia    . Thyroid disease    . Heart disease Mother   . Diabetes Mother     Allergies  Allergen Reactions  . Penicillins Swelling and Palpitations    Swelling of joints.    Current Outpatient Prescriptions on File Prior to Visit  Medication Sig Dispense Refill  . Aflibercept (EYLEA) 2 MG/0.05ML SOLN Inject into the eye.    Marland Kitchen allopurinol (ZYLOPRIM) 300 MG tablet TAKE 1 TABLET BY MOUTH EVERY DAY 90 tablet 1  . benazepril (LOTENSIN) 20 MG tablet Take 1 tablet (20 mg total) by mouth daily. 90 tablet 1  . levothyroxine (SYNTHROID, LEVOTHROID) 150 MCG tablet Take 1 tablet (150 mcg total) by mouth daily. 90 tablet 1  . Multiple Vitamin (MULTIVITAMIN) tablet Take 1 tablet by mouth daily.    . Multiple Vitamins-Minerals (  PRESERVISION AREDS 2 PO) Take 1 tablet by mouth 2 (two) times daily.     No current facility-administered medications on file prior to visit.    BP 118/70 mmHg  Pulse 67  Temp(Src) 98 F (36.7 C) (Oral)  Resp 20  Ht 5' 4.5" (1.638 m)  Wt 191 lb (86.637 kg)  BMI 32.29 kg/m2  SpO2 98%   1. Risk factors, based on past  M,S,F history.  Cardiovascular risk factors include the essential hypertension.  2.  Physical activities: Fairly active with yard work.  No exercise restrictions  3.  Depression/mood: No history depression or mood disorder  4.  Hearing: Complains of some mild the tinnitus and mild hearing deficits  5.  ADL's: Independent in all aspects of daily living  6.  Fall risk: Low  7.  Home safety: No problems identified  8.  Height weight, and visual acuity; height and weight stable.  Followed by ophthalmology due to macular degeneration  9.  Counseling: Heart healthy diet more regular exercise modest  weight loss.  All encouraged  10. Lab orders based on risk factors: Laboratory studies reviewed and discussed  11. Referral : Follow-up ophthalmology  12. Care plan: Heart healthy diet, weight loss encouraged  13. Cognitive assessment: Alert and oriented with normal affect.  No cognitive dysfunction  14. Screening: We'll continue annual preventive health examinations with screening lab.  Will continued to be monitored closely by ophthalmology.  No further screening colonoscopies unless clinical indication  15. Provider List Update: Primary care medicine ophthalmology and ENT    Review of Systems  Constitutional: Negative for fever, chills, activity change, appetite change and fatigue.  HENT: Negative for congestion, dental problem, ear pain, hearing loss, mouth sores, rhinorrhea, sinus pressure, sneezing, tinnitus, trouble swallowing and voice change.   Eyes: Negative for photophobia, pain, redness and visual disturbance.  Respiratory: Negative for apnea, cough, choking, chest tightness, shortness of breath and wheezing.   Cardiovascular: Negative for chest pain, palpitations and leg swelling.  Gastrointestinal: Negative for nausea, vomiting, abdominal pain, diarrhea, constipation, blood in stool, abdominal distention, anal bleeding and rectal pain.  Genitourinary: Negative for dysuria, urgency, frequency, hematuria, flank pain, decreased urine volume, discharge, penile swelling, scrotal swelling, difficulty urinating, genital sores and testicular pain.  Musculoskeletal: Negative for myalgias, back pain, joint swelling, arthralgias, gait problem, neck pain and neck stiffness.       Right hip pain  Skin: Negative for color change, rash and wound.  Neurological: Negative for dizziness, tremors, seizures, syncope, facial asymmetry, speech difficulty, weakness, light-headedness, numbness and headaches.  Hematological: Negative for adenopathy. Does not bruise/bleed easily.    Psychiatric/Behavioral: Negative for suicidal ideas, hallucinations, behavioral problems, confusion, sleep disturbance, self-injury, dysphoric mood, decreased concentration and agitation. The patient is not nervous/anxious.        Objective:   Physical Exam  Constitutional: He appears well-developed and well-nourished.  HENT:  Head: Normocephalic and atraumatic.  Right Ear: External ear normal.  Left Ear: External ear normal.  Nose: Nose normal.  Mouth/Throat: Oropharynx is clear and moist.  Bilateral hearing aids in place  Eyes: Conjunctivae and EOM are normal. Pupils are equal, round, and reactive to light. No scleral icterus.  Neck: Normal range of motion. Neck supple. No JVD present. No thyromegaly present.  Surgical scar left anterior neck Freely movable subcutaneous nodule left anterior neck, which has been chronic  Cardiovascular: Regular rhythm, normal heart sounds and intact distal pulses.  Exam reveals no gallop and no friction rub.  No murmur heard. Pulmonary/Chest: Effort normal and breath sounds normal. He exhibits no tenderness.  Surgical scar right posterior chest wall  Abdominal: Soft. Bowel sounds are normal. He exhibits no distension and no mass. There is no tenderness.  Right lower quadrant scar Obese  Genitourinary: Penis normal. Guaiac negative stool.  Symmetrical prostate enlargement  Musculoskeletal: Normal range of motion. He exhibits no edema or tenderness.  Bilateral bunions  Lymphadenopathy:    He has no cervical adenopathy.  Neurological: He is alert. He has normal reflexes. No cranial nerve deficit. Coordination normal.  Skin: Skin is warm and dry. No rash noted.  1 cm nodule right heel near the Achilles tendon  Psychiatric: He has a normal mood and affect. His behavior is normal.          Assessment & Plan:   Preventive health exam Hypertension Osteoarthritis.  Postsurgical hypothyroidism.  We'll review TSH Macular degeneration.   Follow-up ophthalmology

## 2016-01-07 NOTE — Progress Notes (Signed)
Pre visit review using our clinic review tool, if applicable. No additional management support is needed unless otherwise documented below in the visit note. 

## 2016-01-07 NOTE — Patient Instructions (Signed)
Limit your sodium (Salt) intake  Please check your blood pressure on a regular basis.  If it is consistently greater than 150/90, please make an office appointment.  Return in 6 months for follow-up  You need to lose weight.  Consider a lower calorie diet and regular exercise.  Please return slides to check for hidden blood

## 2016-01-10 ENCOUNTER — Other Ambulatory Visit: Payer: Medicare Other

## 2016-03-16 ENCOUNTER — Other Ambulatory Visit: Payer: Self-pay | Admitting: Internal Medicine

## 2016-06-14 ENCOUNTER — Other Ambulatory Visit: Payer: Self-pay | Admitting: Internal Medicine

## 2016-06-15 ENCOUNTER — Other Ambulatory Visit: Payer: Self-pay | Admitting: Internal Medicine

## 2016-07-10 ENCOUNTER — Ambulatory Visit (INDEPENDENT_AMBULATORY_CARE_PROVIDER_SITE_OTHER): Payer: Medicare Other | Admitting: Internal Medicine

## 2016-07-10 ENCOUNTER — Encounter: Payer: Self-pay | Admitting: Internal Medicine

## 2016-07-10 VITALS — BP 122/74 | HR 77 | Temp 98.6°F | Resp 20 | Ht 64.5 in | Wt 181.4 lb

## 2016-07-10 DIAGNOSIS — E890 Postprocedural hypothyroidism: Secondary | ICD-10-CM

## 2016-07-10 DIAGNOSIS — M150 Primary generalized (osteo)arthritis: Secondary | ICD-10-CM

## 2016-07-10 DIAGNOSIS — I10 Essential (primary) hypertension: Secondary | ICD-10-CM | POA: Diagnosis not present

## 2016-07-10 DIAGNOSIS — D649 Anemia, unspecified: Secondary | ICD-10-CM | POA: Diagnosis not present

## 2016-07-10 DIAGNOSIS — M159 Polyosteoarthritis, unspecified: Secondary | ICD-10-CM

## 2016-07-10 LAB — CBC WITH DIFFERENTIAL/PLATELET
BASOS ABS: 0 10*3/uL (ref 0.0–0.1)
Basophils Relative: 0.8 % (ref 0.0–3.0)
EOS ABS: 0.1 10*3/uL (ref 0.0–0.7)
Eosinophils Relative: 2.5 % (ref 0.0–5.0)
HCT: 39.6 % (ref 39.0–52.0)
Hemoglobin: 13.4 g/dL (ref 13.0–17.0)
LYMPHS ABS: 2 10*3/uL (ref 0.7–4.0)
Lymphocytes Relative: 33.8 % (ref 12.0–46.0)
MCHC: 34 g/dL (ref 30.0–36.0)
MCV: 87.9 fl (ref 78.0–100.0)
MONO ABS: 0.6 10*3/uL (ref 0.1–1.0)
Monocytes Relative: 9.6 % (ref 3.0–12.0)
NEUTROS ABS: 3.1 10*3/uL (ref 1.4–7.7)
NEUTROS PCT: 53.3 % (ref 43.0–77.0)
PLATELETS: 218 10*3/uL (ref 150.0–400.0)
RBC: 4.5 Mil/uL (ref 4.22–5.81)
RDW: 14.3 % (ref 11.5–15.5)
WBC: 5.8 10*3/uL (ref 4.0–10.5)

## 2016-07-10 NOTE — Patient Instructions (Signed)
Limit your sodium (Salt) intake  Please check your blood pressure on a regular basis.  If it is consistently greater than 150/90, please make an office appointment.    It is important that you exercise regularly, at least 20 minutes 3 to 4 times per week.  If you develop chest pain or shortness of breath seek  medical attention.  Return in 6 months for follow-up  

## 2016-07-10 NOTE — Progress Notes (Signed)
Pre visit review using our clinic review tool, if applicable. No additional management support is needed unless otherwise documented below in the visit note. 

## 2016-07-10 NOTE — Progress Notes (Signed)
Subjective:    Patient ID: [place], male    DOB: [birth year], 82 y.o.   MRN: [id number]  HPI  BP Readings from Last 3 Encounters:  07/10/16 122/74  01/07/16 118/70  11/18/15 (!) 30/70   82 year old patient who has essential hypertension.  He has a history of osteoarthritis and remote history of thyroid cancer.  He remains on supplemental thyroid medication. He was seen for an annual exam 6 months ago.  Laboratory studies were unremarkable except for mild anemia. He takes no anti-inflammatory medications.  No change in his bowel habits.  His arthritis symptoms are controlled with OTC topical medications. He is followed by ophthalmology with macular degeneration.  No new concerns or complaints.  Past Medical History:  Diagnosis Date  . Arthritis    OA AND PAIN LEFT HIP AND OTHER JOINT PAINS  . Cancer (HCC)    THYROID CANCER - HAD THYROID REMOVED  . History of kidney stones   . History of shingles    RESOLVED  . Hypertension   . Hypothyroidism   . Low back pain   . Macular degeneration of both eyes   . RBBB (right bundle branch block with left anterior fascicular block)   . Seasonal allergies   . Thyroid disease   . Uric acid renal calculus 06/20/2007   Qualifier: Diagnosis of  By: Rogue Bussing CMA, Maryann Alar       Social History   Social History  . Marital status: Married    Spouse name: N/A  . Number of children: N/A  . Years of education: N/A   Occupational History  . retired    Social History Main Topics  . Smoking status: Former Smoker    Types: Cigarettes    Quit date: 02/22/1979  . Smokeless tobacco: Not on file  . Alcohol use 0.0 oz/week  . Drug use: No  . Sexual activity: Yes   Other Topics Concern  . Not on file   Social History Narrative  . No narrative on file    Past Surgical History:  Procedure Laterality Date  . APPENDECTOMY  1948  . COLONOSCOPY    . Elbow Radical Reduction  1973  . [place]  . REMOVAL OF  FIRST RIB   NOV 1994   FOR COMPRESSED VEIN BETWEEN 1 ST RIB AND COLLARBONE  . Rib removed, 1st  1994  . ROTATOR CUFF REPAIR  2009  . THYROIDECTOMY  2010  . TOTAL HIP ARTHROPLASTY Left 05/31/2013   Procedure: LEFT TOTAL HIP ARTHROPLASTY ANTERIOR APPROACH;  Surgeon: Gearlean Alf, MD;  Location: [facility];  Service: Orthopedics;  Laterality: Left;    Family History  Problem Relation Age of Onset  . Anemia    . Thyroid disease    . Heart disease Mother   . Diabetes Mother     Allergies  Allergen Reactions  . Penicillins Swelling and Palpitations    Swelling of joints.    Current Outpatient Prescriptions on File Prior to Visit  Medication Sig Dispense Refill  . Aflibercept (EYLEA) 2 MG/0.05ML SOLN Inject into the eye.    Marland Kitchen allopurinol (ZYLOPRIM) 300 MG tablet TAKE 1 TABLET (300 MG TOTAL) BY MOUTH DAILY. 90 tablet 0  . benazepril (LOTENSIN) 20 MG tablet TAKE 1 TABLET (20 MG TOTAL) BY MOUTH DAILY. 90 tablet 1  . levothyroxine (SYNTHROID, LEVOTHROID) 150 MCG tablet TAKE 1 TABLET (150 MCG TOTAL) BY MOUTH DAILY. 90 tablet 1  . Multiple Vitamin (MULTIVITAMIN)  tablet Take 1 tablet by mouth daily.    . Multiple Vitamins-Minerals (PRESERVISION AREDS 2 PO) Take 1 tablet by mouth 2 (two) times daily.     No current facility-administered medications on file prior to visit.     BP 122/74 (BP Location: Left Arm, Patient Position: Sitting, Cuff Size: Normal)   Pulse 77   Temp 98.6 F (37 C) (Oral)   Resp 20   Ht 5' 4.5" (1.638 m)   Wt 181 lb 6.1 oz (82.3 kg)   SpO2 99%   BMI 30.65 kg/m    Review of Systems  Constitutional: Negative for appetite change, chills, fatigue and fever.  HENT: Negative for congestion, dental problem, ear pain, hearing loss, sore throat, tinnitus, trouble swallowing and voice change.   Eyes: Negative for pain, discharge and visual disturbance.  Respiratory: Negative for cough, chest tightness, wheezing and stridor.   Cardiovascular: Negative for chest pain,  palpitations and leg swelling.  Gastrointestinal: Negative for abdominal distention, abdominal pain, blood in stool, constipation, diarrhea, nausea and vomiting.  Genitourinary: Negative for difficulty urinating, discharge, flank pain, genital sores, hematuria and urgency.  Musculoskeletal: Positive for arthralgias. Negative for back pain, gait problem, joint swelling, myalgias and neck stiffness.       Mild knee pain  Skin: Negative for rash.  Neurological: Negative for dizziness, syncope, speech difficulty, weakness, numbness and headaches.  Hematological: Negative for adenopathy. Does not bruise/bleed easily.  Psychiatric/Behavioral: Negative for behavioral problems and dysphoric mood. The patient is not nervous/anxious.        Objective:   Physical Exam  Constitutional: He is oriented to person, place, and time. He appears well-developed.  Blood pressure 120/74  HENT:  Head: Normocephalic.  Right Ear: External ear normal.  Left Ear: External ear normal.  Eyes: Conjunctivae and EOM are normal.  Neck: Normal range of motion.  Cardiovascular: Normal rate and normal heart sounds.   Pulmonary/Chest: Breath sounds normal.  Abdominal: Bowel sounds are normal.  Musculoskeletal: Normal range of motion. He exhibits no edema or tenderness.  Neurological: He is alert and oriented to person, place, and time.  Psychiatric: He has a normal mood and affect. His behavior is normal.          Assessment & Plan:   Hypertension, well-controlled.  No change in medical regimen.  Will continue home blood pressure monitoring and restricted salt diet History mild anemia.  Will repeat CBC Hypothyroidism.  Continue levothyroxine History of hyperuricemia and uric acid stones.  Continue allopurinol  CPX 6 months  Nyoka Cowden

## 2016-07-21 ENCOUNTER — Other Ambulatory Visit: Payer: Self-pay | Admitting: Internal Medicine

## 2016-08-05 ENCOUNTER — Other Ambulatory Visit: Payer: Self-pay | Admitting: Emergency Medicine

## 2016-08-05 ENCOUNTER — Telehealth: Payer: Self-pay | Admitting: Emergency Medicine

## 2016-08-05 MED ORDER — BENAZEPRIL HCL 20 MG PO TABS: ORAL_TABLET | ORAL | 1 refills | Status: DC

## 2016-08-05 MED ORDER — ALLOPURINOL 300 MG PO TABS: ORAL_TABLET | ORAL | 1 refills | Status: DC

## 2016-08-05 MED ORDER — LEVOTHYROXINE SODIUM 150 MCG PO TABS: ORAL_TABLET | ORAL | 1 refills | Status: DC

## 2016-08-05 NOTE — Telephone Encounter (Signed)
Called patient back and informed him.

## 2016-08-05 NOTE — Telephone Encounter (Signed)
Pt called and stated his current PCP is getting ready to retire and wants to know if you would be willing to except him as a transfer patient. Please advise thanks.

## 2016-08-05 NOTE — Telephone Encounter (Signed)
No, I am not available to see him 

## 2016-08-10 ENCOUNTER — Telehealth: Payer: Self-pay | Admitting: Internal Medicine

## 2016-08-10 NOTE — Telephone Encounter (Addendum)
Pt will have new pharm as of jan 2018 and will need all maintenance medications for 90 day w/refills send to [facility] mail order after of 09-07-16

## 2016-08-11 NOTE — Telephone Encounter (Signed)
Refill request received from House and Rx's sent with refills.

## 2016-08-12 ENCOUNTER — Encounter: Payer: Self-pay | Admitting: Adult Health

## 2016-08-12 ENCOUNTER — Ambulatory Visit (INDEPENDENT_AMBULATORY_CARE_PROVIDER_SITE_OTHER): Payer: Medicare Other | Admitting: Adult Health

## 2016-08-12 VITALS — BP 130/70 | Temp 97.8°F | Ht 64.5 in | Wt 179.9 lb

## 2016-08-12 DIAGNOSIS — H6690 Otitis media, unspecified, unspecified ear: Secondary | ICD-10-CM | POA: Diagnosis not present

## 2016-08-12 MED ORDER — CEFDINIR 300 MG PO CAPS: 300.0000 mg | ORAL_CAPSULE | Freq: Two times a day (BID) | ORAL | 0 refills | Status: AC

## 2016-08-12 NOTE — Progress Notes (Signed)
Subjective:    Patient ID: [place], male    DOB: Jan 11, 1932, 82 y.o.   MRN: [id number]  Otalgia   There is pain in both ears. The current episode started in the past 7 days. The problem occurs constantly. The problem has been unchanged. There has been no fever. Associated symptoms include rhinorrhea. Pertinent negatives include no ear discharge or headaches. He has tried ear drops for the symptoms.      Review of Systems  Constitutional: Negative.   HENT: Positive for ear pain and rhinorrhea. Negative for congestion and ear discharge.   Eyes: Negative.   Respiratory: Negative.   Cardiovascular: Negative.   Neurological: Negative for headaches.   Past Medical History:  Diagnosis Date  . Arthritis    OA AND PAIN LEFT HIP AND OTHER JOINT PAINS  . Cancer (HCC)    THYROID CANCER - HAD THYROID REMOVED  . History of kidney stones   . History of shingles    RESOLVED  . Hypertension   . Hypothyroidism   . Low back pain   . Macular degeneration of both eyes   . RBBB (right bundle branch block with left anterior fascicular block)   . Seasonal allergies   . Thyroid disease   . Uric acid renal calculus 06/20/2007   Qualifier: Diagnosis of  By: Rogue Bussing CMA, Maryann Alar      Social History   Social History  . Marital status: Married    Spouse name: N/A  . Number of children: N/A  . Years of education: N/A   Occupational History  . retired    Social History Main Topics  . Smoking status: Former Smoker    Types: Cigarettes    Quit date: 02/22/1979  . Smokeless tobacco: Not on file  . Alcohol use 0.0 oz/week  . Drug use: No  . Sexual activity: Yes   Other Topics Concern  . Not on file   Social History Narrative  . No narrative on file    Past Surgical History:  Procedure Laterality Date  . APPENDECTOMY  1948  . COLONOSCOPY    . Elbow Radical Reduction  1973  . [place]  . REMOVAL OF FIRST RIB   NOV 1994   FOR COMPRESSED VEIN  BETWEEN 1 ST RIB AND COLLARBONE  . Rib removed, 1st  1994  . ROTATOR CUFF REPAIR  2009  . THYROIDECTOMY  2010  . TOTAL HIP ARTHROPLASTY Left 05/31/2013   Procedure: LEFT TOTAL HIP ARTHROPLASTY ANTERIOR APPROACH;  Surgeon: Gearlean Alf, MD;  Location: [facility];  Service: Orthopedics;  Laterality: Left;    Family History  Problem Relation Age of Onset  . Anemia    . Thyroid disease    . Heart disease Mother   . Diabetes Mother     Allergies  Allergen Reactions  . Penicillins Swelling and Palpitations    Swelling of joints.    Current Outpatient Prescriptions on File Prior to Visit  Medication Sig Dispense Refill  . Aflibercept (EYLEA) 2 MG/0.05ML SOLN Inject into the eye.    Marland Kitchen allopurinol (ZYLOPRIM) 300 MG tablet TAKE 1 TABLET (300 MG TOTAL) BY MOUTH DAILY. 90 tablet 1  . benazepril (LOTENSIN) 20 MG tablet TAKE 1 TABLET (20 MG TOTAL) BY MOUTH DAILY. 90 tablet 1  . levothyroxine (SYNTHROID, LEVOTHROID) 150 MCG tablet TAKE 1 TABLET (150 MCG TOTAL) BY MOUTH DAILY. 90 tablet 1  . Multiple Vitamin (MULTIVITAMIN) tablet Take 1 tablet by  mouth daily.    . Multiple Vitamins-Minerals (PRESERVISION AREDS 2 PO) Take 1 tablet by mouth 2 (two) times daily.     No current facility-administered medications on file prior to visit.     BP 130/70   Temp 97.8 F (36.6 C) (Oral)   Ht 5' 4.5" (1.638 m)   Wt 179 lb 14.4 oz (81.6 kg)   BMI 30.40 kg/m       Objective:   Physical Exam  Constitutional: He appears well-developed and well-nourished. No distress.  HENT:  Head: Normocephalic and atraumatic.  Right Ear: External ear and ear canal normal. Tympanic membrane is erythematous and bulging. Decreased hearing is noted.  Left Ear: External ear normal. Tympanic membrane is erythematous and bulging. Tympanic membrane is not injected and not perforated. Decreased hearing is noted.  Nose: Nose normal.  Mouth/Throat: Uvula is midline and oropharynx is clear and moist. No oropharyngeal exudate.    Skin: He is not diaphoretic.  Nursing note and vitals reviewed.     Assessment & Plan:  1. Acute otitis media, unspecified otitis media type - Will treat as otitis media.  - cefdinir (OMNICEF) 300 MG capsule; Take 1 capsule (300 mg total) by mouth 2 (two) times daily.  Dispense: 20 capsule; Refill: 0 - Follow up if no improvement  Dorothyann Peng, NP

## 2016-08-12 NOTE — Patient Instructions (Addendum)
It was great seeing you today!  Your exam is consistent with an inner ear infection. I have sent in a medication called Omnicef. This is an antibiotics. Take it twice a day for 10 days   Follow up if no improvement   Otitis Media, Adult Otitis media is redness, soreness, and puffiness (swelling) in the space just behind your eardrum (middle ear). It may be caused by allergies or infection. It often happens along with a cold. Follow these instructions at home:  Take your medicine as told. Finish it even if you start to feel better.  Only take over-the-counter or prescription medicines for pain, discomfort, or fever as told by your doctor.  Follow up with your doctor as told. Contact a doctor if:  You have otitis media only in one ear, or bleeding from your nose, or both.  You notice a lump on your neck.  You are not getting better in 3-5 days.  You feel worse instead of better. Get help right away if:  You have pain that is not helped with medicine.  You have puffiness, redness, or pain around your ear.  You get a stiff neck.  You cannot move part of your face (paralysis).  You notice that the bone behind your ear hurts when you touch it. This information is not intended to replace advice given to you by your health care provider. Make sure you discuss any questions you have with your health care provider. Document Released: 02/10/2008 Document Revised: 01/30/2016 Document Reviewed: 03/21/2013 Elsevier Interactive Patient Education  2017 [employer].

## 2016-09-24 DIAGNOSIS — H353231 Exudative age-related macular degeneration, bilateral, with active choroidal neovascularization: Secondary | ICD-10-CM | POA: Diagnosis not present

## 2016-11-26 DIAGNOSIS — H353231 Exudative age-related macular degeneration, bilateral, with active choroidal neovascularization: Secondary | ICD-10-CM | POA: Diagnosis not present

## 2016-11-26 DIAGNOSIS — H35723 Serous detachment of retinal pigment epithelium, bilateral: Secondary | ICD-10-CM | POA: Diagnosis not present

## 2017-01-01 ENCOUNTER — Other Ambulatory Visit (INDEPENDENT_AMBULATORY_CARE_PROVIDER_SITE_OTHER): Payer: Medicare PPO

## 2017-01-01 ENCOUNTER — Other Ambulatory Visit: Payer: Medicare Other | Admitting: Internal Medicine

## 2017-01-01 DIAGNOSIS — I10 Essential (primary) hypertension: Secondary | ICD-10-CM

## 2017-01-01 DIAGNOSIS — Z85850 Personal history of malignant neoplasm of thyroid: Secondary | ICD-10-CM | POA: Diagnosis not present

## 2017-01-01 DIAGNOSIS — E785 Hyperlipidemia, unspecified: Secondary | ICD-10-CM | POA: Diagnosis not present

## 2017-01-01 LAB — CBC WITH DIFFERENTIAL/PLATELET
BASOS ABS: 0.1 10*3/uL (ref 0.0–0.1)
Basophils Relative: 1.2 % (ref 0.0–3.0)
EOS ABS: 0.1 10*3/uL (ref 0.0–0.7)
Eosinophils Relative: 2.5 % (ref 0.0–5.0)
HEMATOCRIT: 38.6 % — AB (ref 39.0–52.0)
HEMOGLOBIN: 13.1 g/dL (ref 13.0–17.0)
LYMPHS PCT: 34 % (ref 12.0–46.0)
Lymphs Abs: 1.6 10*3/uL (ref 0.7–4.0)
MCHC: 34 g/dL (ref 30.0–36.0)
MCV: 88.3 fl (ref 78.0–100.0)
Monocytes Absolute: 0.5 10*3/uL (ref 0.1–1.0)
Monocytes Relative: 10 % (ref 3.0–12.0)
Neutro Abs: 2.4 10*3/uL (ref 1.4–7.7)
Neutrophils Relative %: 52.3 % (ref 43.0–77.0)
PLATELETS: 217 10*3/uL (ref 150.0–400.0)
RBC: 4.37 Mil/uL (ref 4.22–5.81)
RDW: 14.1 % (ref 11.5–15.5)
WBC: 4.6 10*3/uL (ref 4.0–10.5)

## 2017-01-01 LAB — HEPATIC FUNCTION PANEL
ALK PHOS: 77 U/L (ref 39–117)
ALT: 16 U/L (ref 0–53)
AST: 15 U/L (ref 0–37)
Albumin: 3.7 g/dL (ref 3.5–5.2)
BILIRUBIN DIRECT: 0.1 mg/dL (ref 0.0–0.3)
Total Bilirubin: 0.5 mg/dL (ref 0.2–1.2)
Total Protein: 6.6 g/dL (ref 6.0–8.3)

## 2017-01-01 LAB — BASIC METABOLIC PANEL
BUN: 19 mg/dL (ref 6–23)
CALCIUM: 9 mg/dL (ref 8.4–10.5)
CO2: 28 mEq/L (ref 19–32)
CREATININE: 0.76 mg/dL (ref 0.40–1.50)
Chloride: 106 mEq/L (ref 96–112)
GFR: 104.22 mL/min (ref >60.00)
Glucose, Bld: 102 mg/dL — ABNORMAL HIGH (ref 70–99)
Potassium: 4.5 mEq/L (ref 3.5–5.1)
SODIUM: 140 meq/L (ref 135–145)

## 2017-01-01 LAB — LIPID PANEL
CHOL/HDL RATIO: 3
Cholesterol: 128 mg/dL (ref 0–200)
HDL: 50.5 mg/dL (ref >39.00)
LDL Cholesterol: 71 mg/dL (ref 0–99)
NONHDL: 77.39
Triglycerides: 33 mg/dL (ref 0.0–149.0)
VLDL: 6.6 mg/dL (ref 0.0–40.0)

## 2017-01-01 LAB — TSH: TSH: 0.26 u[IU]/mL — AB (ref 0.35–4.50)

## 2017-01-07 DIAGNOSIS — H35723 Serous detachment of retinal pigment epithelium, bilateral: Secondary | ICD-10-CM | POA: Diagnosis not present

## 2017-01-07 DIAGNOSIS — H353231 Exudative age-related macular degeneration, bilateral, with active choroidal neovascularization: Secondary | ICD-10-CM | POA: Diagnosis not present

## 2017-01-08 ENCOUNTER — Ambulatory Visit (INDEPENDENT_AMBULATORY_CARE_PROVIDER_SITE_OTHER): Payer: Medicare PPO | Admitting: Internal Medicine

## 2017-01-08 ENCOUNTER — Encounter: Payer: Self-pay | Admitting: Internal Medicine

## 2017-01-08 VITALS — BP 142/72 | HR 78 | Temp 98.1°F | Ht 64.5 in | Wt 183.8 lb

## 2017-01-08 DIAGNOSIS — I10 Essential (primary) hypertension: Secondary | ICD-10-CM | POA: Diagnosis not present

## 2017-01-08 DIAGNOSIS — M150 Primary generalized (osteo)arthritis: Secondary | ICD-10-CM

## 2017-01-08 DIAGNOSIS — Z0000 Encounter for general adult medical examination without abnormal findings: Secondary | ICD-10-CM | POA: Diagnosis not present

## 2017-01-08 DIAGNOSIS — M159 Polyosteoarthritis, unspecified: Secondary | ICD-10-CM

## 2017-01-08 DIAGNOSIS — E890 Postprocedural hypothyroidism: Secondary | ICD-10-CM

## 2017-01-08 NOTE — Progress Notes (Signed)
Pre visit review using our clinic review tool, if applicable. No additional management support is needed unless otherwise documented below in the visit note. 

## 2017-01-08 NOTE — Patient Instructions (Addendum)
WE NOW OFFER   [facility] FAST TRACK!!!  SAME DAY Appointments for ACUTE CARE  Such as: Sprains, Injuries, cuts, abrasions, rashes, muscle pain, joint pain, back pain Colds, flu, sore throats, headache, allergies, cough, fever  Ear pain, sinus and eye infections Abdominal pain, nausea, vomiting, diarrhea, upset stomach Animal/insect bites  3 Easy Ways to Schedule: Walk-In Scheduling Call in scheduling Mychart Sign-up: [email or web address]  Limit your sodium (Salt) intake  Please check your blood pressure on a regular basis.  If it is consistently greater than 150/90, please make an office appointment.  Return in 6 months for follow-up

## 2017-01-14 ENCOUNTER — Encounter: Payer: Self-pay | Admitting: Internal Medicine

## 2017-01-14 NOTE — Progress Notes (Signed)
Subjective:    Patient ID: Jay Webster, male    DOB: April 09, 1932, 83 y.o.   MRN: [id number]  HPI 83 year old patient who is seen today for an annual health examination and annual Medicare wellness visit. He is doing well.  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 He has a history of essential hypertension and is followed by ophthalmology due to macular degeneration. He has a history of osteoarthritis and has had prior hip surgery  Past Medical History:  Diagnosis Date  . Arthritis    OA AND PAIN LEFT HIP AND OTHER JOINT PAINS  . Cancer (HCC)    THYROID CANCER - HAD THYROID REMOVED  . History of kidney stones   . History of shingles    RESOLVED  . Hypertension   . Hypothyroidism   . Low back pain   . Macular degeneration of both eyes   . RBBB (right bundle branch block with left anterior fascicular block)   . Seasonal allergies   . Thyroid disease   . Uric acid renal calculus 06/20/2007   Qualifier: Diagnosis of  By: Rogue Bussing CMA, Maryann Alar       Social History   Social History  . Marital status: Married    Spouse name: N/A  . Number of children: N/A  . Years of education: N/A   Occupational History  . retired    Social History Main Topics  . Smoking status: Former Smoker    Types: Cigarettes    Quit date: 02/22/1979  . Smokeless tobacco: Never Used  . Alcohol use 0.0 oz/week  . Drug use: No  . Sexual activity: Yes   Other Topics Concern  . Not on file   Social History Narrative  . No narrative on file    Past Surgical History:  Procedure Laterality Date  . APPENDECTOMY  1948  . COLONOSCOPY    . Elbow Radical Reduction  1973  . [place]  . REMOVAL OF FIRST RIB   NOV 1994   FOR COMPRESSED VEIN BETWEEN 1 ST RIB AND COLLARBONE  . Rib removed, 1st  1994  . ROTATOR CUFF REPAIR  2009  . THYROIDECTOMY  2010  . TOTAL HIP ARTHROPLASTY Left 05/31/2013   Procedure: LEFT TOTAL HIP ARTHROPLASTY  ANTERIOR APPROACH;  Surgeon: Gearlean Alf, MD;  Location: [facility];  Service: Orthopedics;  Laterality: Left;    Family History  Problem Relation Age of Onset  . Anemia Unknown   . Thyroid disease Unknown   . Heart disease Mother   . Diabetes Mother     Allergies  Allergen Reactions  . Penicillins Swelling and Palpitations    Swelling of joints.    Current Outpatient Prescriptions on File Prior to Visit  Medication Sig Dispense Refill  . Aflibercept (EYLEA) 2 MG/0.05ML SOLN Inject into the eye.    Marland Kitchen allopurinol (ZYLOPRIM) 300 MG tablet TAKE 1 TABLET (300 MG TOTAL) BY MOUTH DAILY. 90 tablet 1  . benazepril (LOTENSIN) 20 MG tablet TAKE 1 TABLET (20 MG TOTAL) BY MOUTH DAILY. 90 tablet 1  . levothyroxine (SYNTHROID, LEVOTHROID) 150 MCG tablet TAKE 1 TABLET (150 MCG TOTAL) BY MOUTH DAILY. 90 tablet 1  . Multiple Vitamin (MULTIVITAMIN) tablet Take 1 tablet by mouth daily.    . Multiple Vitamins-Minerals (PRESERVISION AREDS 2 PO) Take 1 tablet by mouth 2 (two) times daily.     No current facility-administered medications on file prior to visit.  BP (!) 142/72 (BP Location: Left Arm, Patient Position: Sitting, Cuff Size: Normal)   Pulse 78   Temp 98.1 F (36.7 C) (Oral)   Ht 5' 4.5" (1.638 m)   Wt 183 lb 12.8 oz (83.4 kg)   SpO2 98%   BMI 31.06 kg/m   Medicare wellness visit  1. Risk factors, based on past  M,S,F history.  Cardiovascular risk factors include the essential hypertension.  2.  Physical activities: Fairly active with yard work.  No exercise restrictions  3.  Depression/mood: No history depression or mood disorder  4.  Hearing: Complains of some mild the tinnitus and mild hearing deficits  5.  ADL's: Independent in all aspects of daily living  6.  Fall risk: Low  7.  Home safety: No problems identified  8.  Height weight, and visual acuity; height and weight stable.  Followed by ophthalmology due to macular degeneration  9.  Counseling: Heart  healthy diet more regular exercise modest weight loss.  All encouraged  10. Lab orders based on risk factors: Laboratory studies reviewed and discussed  11. Referral : Follow-up ophthalmology  12. Care plan: Heart healthy diet, weight loss encouraged  13. Cognitive assessment: Alert and oriented with normal affect.  No cognitive dysfunction  14. Screening: We'll continue annual preventive health examinations with screening lab.  Will continued to be monitored closely by ophthalmology.  No further screening colonoscopies unless clinical indication  15. Provider List Update: Primary care medicine ophthalmology and ENT   Review of Systems  Constitutional: Negative for appetite change, chills, fatigue and fever.  HENT: Negative for congestion, dental problem, ear pain, hearing loss, sore throat, tinnitus, trouble swallowing and voice change.   Eyes: Negative for pain, discharge and visual disturbance.  Respiratory: Negative for cough, chest tightness, wheezing and stridor.   Cardiovascular: Negative for chest pain, palpitations and leg swelling.  Gastrointestinal: Negative for abdominal distention, abdominal pain, blood in stool, constipation, diarrhea, nausea and vomiting.  Genitourinary: Negative for difficulty urinating, discharge, flank pain, genital sores, hematuria and urgency.  Musculoskeletal: Positive for arthralgias and back pain. Negative for gait problem, joint swelling, myalgias and neck stiffness.  Skin: Negative for rash.  Neurological: Negative for dizziness, syncope, speech difficulty, weakness, numbness and headaches.  Hematological: Negative for adenopathy. Does not bruise/bleed easily.  Psychiatric/Behavioral: Negative for behavioral problems and dysphoric mood. The patient is not nervous/anxious.        Objective:   Physical Exam  Constitutional: He is oriented to person, place, and time. He appears well-developed.  obese  HENT:  Head: Normocephalic.  Right  Ear: External ear normal.  Left Ear: External ear normal.  Hearing aids in place  Eyes: Conjunctivae and EOM are normal.  Neck: Normal range of motion.  Surgical scar left anterior neck  Cardiovascular: Normal rate and normal heart sounds.   Pulmonary/Chest: Breath sounds normal.  Abdominal: Bowel sounds are normal.  Right lower quadrant scar  Genitourinary: Rectal exam shows guaiac negative stool.  Genitourinary Comments: Prostatic enlargement  Musculoskeletal: Normal range of motion. He exhibits no edema or tenderness.  Bilateral bunions  Neurological: He is alert and oriented to person, place, and time.  Skin:  Surgical scar right posterior chest wall  Psychiatric: He has a normal mood and affect. His behavior is normal.          Assessment & Plan:   Preventive health examination Subsequent Medicare wellness visit Essential hypertension, stable Surgical hypothyroidism.  TSH reviewed BPH Osteoarthritis History macular degeneration.  Follow-up ophthalmology  Low-salt diet, modest weight loss encouraged More rigorous exercise regimen discussed Laboratory studies reviewed Follow-up here in 6 months or as needed  Nyoka Cowden

## 2017-02-08 ENCOUNTER — Other Ambulatory Visit: Payer: Self-pay | Admitting: Internal Medicine

## 2017-02-25 DIAGNOSIS — H353221 Exudative age-related macular degeneration, left eye, with active choroidal neovascularization: Secondary | ICD-10-CM | POA: Diagnosis not present

## 2017-02-25 DIAGNOSIS — H353211 Exudative age-related macular degeneration, right eye, with active choroidal neovascularization: Secondary | ICD-10-CM | POA: Diagnosis not present

## 2017-04-15 DIAGNOSIS — H35361 Drusen (degenerative) of macula, right eye: Secondary | ICD-10-CM | POA: Diagnosis not present

## 2017-04-15 DIAGNOSIS — H353231 Exudative age-related macular degeneration, bilateral, with active choroidal neovascularization: Secondary | ICD-10-CM | POA: Diagnosis not present

## 2017-04-15 DIAGNOSIS — H353211 Exudative age-related macular degeneration, right eye, with active choroidal neovascularization: Secondary | ICD-10-CM | POA: Diagnosis not present

## 2017-05-27 ENCOUNTER — Encounter: Payer: Self-pay | Admitting: Internal Medicine

## 2017-06-10 DIAGNOSIS — H35723 Serous detachment of retinal pigment epithelium, bilateral: Secondary | ICD-10-CM | POA: Diagnosis not present

## 2017-06-10 DIAGNOSIS — H353211 Exudative age-related macular degeneration, right eye, with active choroidal neovascularization: Secondary | ICD-10-CM | POA: Diagnosis not present

## 2017-06-10 DIAGNOSIS — H353231 Exudative age-related macular degeneration, bilateral, with active choroidal neovascularization: Secondary | ICD-10-CM | POA: Diagnosis not present

## 2017-06-10 DIAGNOSIS — H35361 Drusen (degenerative) of macula, right eye: Secondary | ICD-10-CM | POA: Diagnosis not present

## 2017-07-12 ENCOUNTER — Ambulatory Visit (INDEPENDENT_AMBULATORY_CARE_PROVIDER_SITE_OTHER): Payer: Medicare PPO | Admitting: Internal Medicine

## 2017-07-12 ENCOUNTER — Encounter: Payer: Self-pay | Admitting: Internal Medicine

## 2017-07-12 VITALS — BP 122/78 | HR 71 | Temp 97.9°F | Ht 64.5 in | Wt 174.0 lb

## 2017-07-12 DIAGNOSIS — I10 Essential (primary) hypertension: Secondary | ICD-10-CM

## 2017-07-12 DIAGNOSIS — E890 Postprocedural hypothyroidism: Secondary | ICD-10-CM | POA: Diagnosis not present

## 2017-07-12 NOTE — Patient Instructions (Signed)
Limit your sodium (Salt) intake  Please check your blood pressure on a regular basis.  If it is consistently greater than 150/90, please make an office appointment.    It is important that you exercise regularly, at least 20 minutes 3 to 4 times per week.  If you develop chest pain or shortness of breath seek  medical attention.  Return in 6 months for follow-up  

## 2017-07-12 NOTE — Progress Notes (Signed)
Subjective:    Patient ID: [place], male    DOB: 08-22-1932, 83 y.o.   MRN: [id number]  HPI  83 year old patient who is seen today for follow-up. He has a history of essential hypertension.  He has surgical hypothyroidism. Doing quite well except adjusting to the death of his mate of greater than 30 years.  He died recently from a ruptured esophageal varix after a prolonged ICU hospital stay  Past Medical History:  Diagnosis Date  . Arthritis    OA AND PAIN LEFT HIP AND OTHER JOINT PAINS  . Cancer (HCC)    THYROID CANCER - HAD THYROID REMOVED  . History of kidney stones   . History of shingles    RESOLVED  . Hypertension   . Hypothyroidism   . Low back pain   . Macular degeneration of both eyes   . RBBB (right bundle branch block with left anterior fascicular block)   . Seasonal allergies   . Thyroid disease   . Uric acid renal calculus 06/20/2007   Qualifier: Diagnosis of  By: Rogue Bussing CMA, Maryann Alar       Social History   Socioeconomic History  . Marital status: Married    Spouse name: Not on file  . Number of children: Not on file  . Years of education: Not on file  . Highest education level: Not on file  Social Needs  . Financial resource strain: Not on file  . Food insecurity - worry: Not on file  . Food insecurity - inability: Not on file  . Transportation needs - medical: Not on file  . Transportation needs - non-medical: Not on file  Occupational History  . Occupation: retired  Tobacco Use  . Smoking status: Former Smoker    Types: Cigarettes    Last attempt to quit: 02/22/1979    Years since quitting: 38.4  . Smokeless tobacco: Never Used  Substance and Sexual Activity  . Alcohol use: Yes    Alcohol/week: 0.0 oz  . Drug use: No  . Sexual activity: Yes  Other Topics Concern  . Not on file  Social History Narrative  . Not on file    Past Surgical History:  Procedure Laterality Date  . APPENDECTOMY  1948  . COLONOSCOPY    . Elbow  Radical Reduction  1973  . [place]  . REMOVAL OF FIRST RIB   NOV 1994   FOR COMPRESSED VEIN BETWEEN 1 ST RIB AND COLLARBONE  . Rib removed, 1st  1994  . ROTATOR CUFF REPAIR  2009  . THYROIDECTOMY  2010    Family History  Problem Relation Age of Onset  . Anemia Unknown   . Thyroid disease Unknown   . Heart disease Mother   . Diabetes Mother     Allergies  Allergen Reactions  . Penicillins Swelling and Palpitations    Swelling of joints.    Current Outpatient Medications on File Prior to Visit  Medication Sig Dispense Refill  . Aflibercept (EYLEA) 2 MG/0.05ML SOLN Inject into the eye.    Marland Kitchen allopurinol (ZYLOPRIM) 300 MG tablet TAKE 1 TABLET (300 MG TOTAL) BY MOUTH DAILY. 90 tablet 1  . benazepril (LOTENSIN) 20 MG tablet TAKE 1 TABLET (20 MG TOTAL) BY MOUTH DAILY. 90 tablet 1  . levothyroxine (SYNTHROID, LEVOTHROID) 150 MCG tablet TAKE 1 TABLET (150 MCG TOTAL) BY MOUTH DAILY. 90 tablet 1  . Multiple Vitamin (MULTIVITAMIN) tablet Take 1 tablet by mouth daily.    Marland Kitchen  Multiple Vitamins-Minerals (PRESERVISION AREDS 2 PO) Take 1 tablet by mouth 2 (two) times daily.     No current facility-administered medications on file prior to visit.     BP 122/78 (BP Location: Left Arm, Patient Position: Sitting, Cuff Size: Normal)   Pulse 71   Temp 97.9 F (36.6 C) (Oral)   Ht 5' 4.5" (1.638 m)   Wt 174 lb (78.9 kg)   SpO2 96%   BMI 29.41 kg/m     Review of Systems  Constitutional: Negative for appetite change, chills, fatigue and fever.  HENT: Negative for congestion, dental problem, ear pain, hearing loss, sore throat, tinnitus, trouble swallowing and voice change.   Eyes: Negative for pain, discharge and visual disturbance.  Respiratory: Negative for cough, chest tightness, wheezing and stridor.   Cardiovascular: Negative for chest pain, palpitations and leg swelling.  Gastrointestinal: Negative for abdominal distention, abdominal pain, blood in stool,  constipation, diarrhea, nausea and vomiting.  Genitourinary: Negative for difficulty urinating, discharge, flank pain, genital sores, hematuria and urgency.  Musculoskeletal: Negative for arthralgias, back pain, gait problem, joint swelling, myalgias and neck stiffness.  Skin: Negative for rash.  Neurological: Negative for dizziness, syncope, speech difficulty, weakness, numbness and headaches.  Hematological: Negative for adenopathy. Does not bruise/bleed easily.  Psychiatric/Behavioral: Positive for dysphoric mood and sleep disturbance. Negative for behavioral problems. The patient is nervous/anxious.        Objective:   Physical Exam  Constitutional: He is oriented to person, place, and time. He appears well-developed.  HENT:  Head: Normocephalic.  Right Ear: External ear normal.  Left Ear: External ear normal.  Eyes: Conjunctivae and EOM are normal.  Neck: Normal range of motion.  Cardiovascular: Normal rate and normal heart sounds.  Pulmonary/Chest: Breath sounds normal.  Abdominal: Bowel sounds are normal.  Musculoskeletal: Normal range of motion. He exhibits no edema or tenderness.  Neurological: He is alert and oriented to person, place, and time.  Psychiatric: He has a normal mood and affect.  Angry mood          Assessment & Plan:   Essential hypertension well-controlled Hypothyroidism. Adjustment disorder with anxiety anger and depressed mood  CPX 6 months Medications updated  Nyoka Cowden

## 2017-07-14 ENCOUNTER — Other Ambulatory Visit: Payer: Self-pay | Admitting: Internal Medicine

## 2017-07-26 ENCOUNTER — Telehealth: Payer: Self-pay | Admitting: Family Medicine

## 2017-07-26 NOTE — Telephone Encounter (Signed)
Notify patient that we can discuss and implement at the time of his next office visit.

## 2017-07-26 NOTE — Telephone Encounter (Signed)
Copied from [place] [id number]. Topic: Inquiry >> Jul 26, 2017 11:37 AM Scherrie Gerlach wrote: Reason for CRM:  Pt would like to sign a DNR. Would like to know how to go about signing this. Home or cell ok to call

## 2017-07-26 NOTE — Telephone Encounter (Signed)
Please advise 

## 2017-07-27 NOTE — Telephone Encounter (Signed)
Spoke with pt and informed him that the DRN form may be discuss and implement at the time of his next office visit.

## 2017-08-05 DIAGNOSIS — H35361 Drusen (degenerative) of macula, right eye: Secondary | ICD-10-CM | POA: Diagnosis not present

## 2017-08-05 DIAGNOSIS — H35371 Puckering of macula, right eye: Secondary | ICD-10-CM | POA: Diagnosis not present

## 2017-08-05 DIAGNOSIS — H35723 Serous detachment of retinal pigment epithelium, bilateral: Secondary | ICD-10-CM | POA: Diagnosis not present

## 2017-08-05 DIAGNOSIS — H25813 Combined forms of age-related cataract, bilateral: Secondary | ICD-10-CM | POA: Diagnosis not present

## 2017-08-05 DIAGNOSIS — H353231 Exudative age-related macular degeneration, bilateral, with active choroidal neovascularization: Secondary | ICD-10-CM | POA: Diagnosis not present

## 2017-08-05 DIAGNOSIS — H353211 Exudative age-related macular degeneration, right eye, with active choroidal neovascularization: Secondary | ICD-10-CM | POA: Diagnosis not present

## 2017-08-09 ENCOUNTER — Ambulatory Visit: Payer: Medicare PPO | Admitting: Internal Medicine

## 2017-08-09 ENCOUNTER — Encounter: Payer: Self-pay | Admitting: Internal Medicine

## 2017-08-09 VITALS — BP 112/70 | HR 81 | Temp 98.4°F | Ht 64.5 in | Wt 175.6 lb

## 2017-08-09 DIAGNOSIS — F4321 Adjustment disorder with depressed mood: Secondary | ICD-10-CM | POA: Diagnosis not present

## 2017-08-09 DIAGNOSIS — I10 Essential (primary) hypertension: Secondary | ICD-10-CM

## 2017-08-09 NOTE — Patient Instructions (Signed)
Please follow-up with hospice for grief counseling  Limit your sodium (Salt) intake  Please check your blood pressure on a regular basis.  If it is consistently greater than 150/90, please make an office appointment.  Return in 6 months for follow-up

## 2017-08-09 NOTE — Progress Notes (Signed)
   Subjective:    Patient ID: [place], male    DOB: 11-30-1931, 83 y.o.   MRN: [id number]  HPI  83 year old patient who is seen today to discuss DNR and to complete paperwork.  He has recently lost his partner of greater than 30 years duration due to esophageal variceal bleeding.  He plans on pursuing counseling with hospice in the very near future DNR discussed at length forms signed and completed.  Patient given a copy  Review of Systems  Psychiatric/Behavioral: Positive for behavioral problems and dysphoric mood. The patient is nervous/anxious.        Objective:   Physical Exam  Constitutional: He is oriented to person, place, and time. He appears well-developed.  HENT:  Head: Normocephalic.  Right Ear: External ear normal.  Left Ear: External ear normal.  Eyes: Conjunctivae and EOM are normal.  Neck: Normal range of motion.  Cardiovascular: Normal rate and normal heart sounds.  Pulmonary/Chest: Breath sounds normal.  Abdominal: Bowel sounds are normal.  Musculoskeletal: Normal range of motion. He exhibits no edema or tenderness.  Neurological: He is alert and oriented to person, place, and time.  Psychiatric: He has a normal mood and affect. His behavior is normal.          Assessment & Plan:   Brief reaction.  Patient plans to follow-up and receive grief counseling with hospice DNR forms completed  Follow-up here 6 months or as needed  Nyoka Cowden

## 2017-09-23 ENCOUNTER — Encounter: Payer: Self-pay | Admitting: Internal Medicine

## 2017-09-23 ENCOUNTER — Ambulatory Visit: Payer: Medicare PPO | Admitting: Internal Medicine

## 2017-09-23 VITALS — BP 142/72 | HR 71 | Temp 97.7°F | Ht 64.0 in | Wt 178.2 lb

## 2017-09-23 DIAGNOSIS — E119 Type 2 diabetes mellitus without complications: Secondary | ICD-10-CM | POA: Diagnosis not present

## 2017-09-23 DIAGNOSIS — I10 Essential (primary) hypertension: Secondary | ICD-10-CM | POA: Diagnosis not present

## 2017-09-23 DIAGNOSIS — G44019 Episodic cluster headache, not intractable: Secondary | ICD-10-CM | POA: Diagnosis not present

## 2017-09-23 LAB — GLUCOSE, POCT (MANUAL RESULT ENTRY): POC Glucose: 100 mg/dl — AB (ref 70–99)

## 2017-09-23 NOTE — Progress Notes (Signed)
Subjective:    Patient ID: [place], male    DOB: 1931/10/19, 84 y.o.   MRN: [id number]  HPI 84 year old patient who has essential hypertension. He has been under considerable situational stress due to the recent loss of his partner of 34 years and also stresses handling his estate.  He woke this morning with headache And also noted accelerated blood pressure with a reading of 097 systolic he also noted some mild blurred vision.  He took Tylenol and 81 mg of aspirin.  He states his headache presently is very mild. Blood pressure on arrival 142/72 Blood pressure later in the exam 124/70  He states that he is followed close by ophthalmology due to macular degeneration and really his vision is at baseline.  Past Medical History:  Diagnosis Date  . Arthritis    OA AND PAIN LEFT HIP AND OTHER JOINT PAINS  . Cancer (HCC)    THYROID CANCER - HAD THYROID REMOVED  . History of kidney stones   . History of shingles    RESOLVED  . Hypertension   . Hypothyroidism   . Low back pain   . Macular degeneration of both eyes   . RBBB (right bundle branch block with left anterior fascicular block)   . Seasonal allergies   . Thyroid disease   . Uric acid renal calculus 06/20/2007   Qualifier: Diagnosis of  By: Rogue Bussing CMA, Maryann Alar       Social History   Socioeconomic History  . Marital status: Married    Spouse name: Not on file  . Number of children: Not on file  . Years of education: Not on file  . Highest education level: Not on file  Social Needs  . Financial resource strain: Not on file  . Food insecurity - worry: Not on file  . Food insecurity - inability: Not on file  . Transportation needs - medical: Not on file  . Transportation needs - non-medical: Not on file  Occupational History  . Occupation: retired  Tobacco Use  . Smoking status: Former Smoker    Types: Cigarettes    Last attempt to quit: 02/22/1979    Years since quitting: 38.6  . Smokeless tobacco:  Never Used  Substance and Sexual Activity  . Alcohol use: Yes    Alcohol/week: 0.0 oz  . Drug use: No  . Sexual activity: Yes  Other Topics Concern  . Not on file  Social History Narrative  . Not on file    Past Surgical History:  Procedure Laterality Date  . APPENDECTOMY  1948  . COLONOSCOPY    . Elbow Radical Reduction  1973  . [place]  . REMOVAL OF FIRST RIB   NOV 1994   FOR COMPRESSED VEIN BETWEEN 1 ST RIB AND COLLARBONE  . Rib removed, 1st  1994  . ROTATOR CUFF REPAIR  2009  . THYROIDECTOMY  2010  . TOTAL HIP ARTHROPLASTY Left 05/31/2013   Procedure: LEFT TOTAL HIP ARTHROPLASTY ANTERIOR APPROACH;  Surgeon: Gearlean Alf, MD;  Location: [facility];  Service: Orthopedics;  Laterality: Left;    Family History  Problem Relation Age of Onset  . Anemia Unknown   . Thyroid disease Unknown   . Heart disease Mother   . Diabetes Mother     Allergies  Allergen Reactions  . Penicillins Swelling and Palpitations    Swelling of joints.    Current Outpatient Medications on File Prior to Visit  Medication  Sig Dispense Refill  . Aflibercept (EYLEA) 2 MG/0.05ML SOLN Inject into the eye.    Marland Kitchen allopurinol (ZYLOPRIM) 300 MG tablet TAKE 1 TABLET (300 MG TOTAL) BY MOUTH DAILY. 90 tablet 1  . benazepril (LOTENSIN) 20 MG tablet TAKE 1 TABLET (20 MG TOTAL) BY MOUTH DAILY. 90 tablet 1  . levothyroxine (SYNTHROID, LEVOTHROID) 150 MCG tablet TAKE 1 TABLET (150 MCG TOTAL) BY MOUTH DAILY. 90 tablet 1  . Multiple Vitamin (MULTIVITAMIN) tablet Take 1 tablet by mouth daily.    . Multiple Vitamins-Minerals (PRESERVISION AREDS 2 PO) Take 1 tablet by mouth 2 (two) times daily.     No current facility-administered medications on file prior to visit.     BP (!) 142/72 (BP Location: Left Arm, Patient Position: Sitting, Cuff Size: Large)   Pulse 71   Temp 97.7 F (36.5 C) (Oral)   Ht 5\' 4"  (1.626 m)   Wt 178 lb 3.2 oz (80.8 kg)   SpO2 98%   BMI 30.59 kg/m       Review of Systems  Constitutional: Negative for appetite change, chills, fatigue and fever.  HENT: Negative for congestion, dental problem, ear pain, hearing loss, sore throat, tinnitus, trouble swallowing and voice change.   Eyes: Negative for pain, discharge and visual disturbance.  Respiratory: Negative for cough, chest tightness, wheezing and stridor.   Cardiovascular: Negative for chest pain, palpitations and leg swelling.  Gastrointestinal: Negative for abdominal distention, abdominal pain, blood in stool, constipation, diarrhea, nausea and vomiting.  Genitourinary: Negative for difficulty urinating, discharge, flank pain, genital sores, hematuria and urgency.  Musculoskeletal: Negative for arthralgias, back pain, gait problem, joint swelling, myalgias and neck stiffness.  Skin: Negative for rash.  Neurological: Positive for headaches. Negative for dizziness, syncope, speech difficulty, weakness and numbness.  Hematological: Negative for adenopathy. Does not bruise/bleed easily.  Psychiatric/Behavioral: Positive for dysphoric mood. Negative for behavioral problems. The patient is nervous/anxious.        Objective:   Physical Exam  Constitutional: He is oriented to person, place, and time. He appears well-developed.  Anxious No distress Follow-up blood pressure readings as low as 124/70   HENT:  Head: Normocephalic.  Right Ear: External ear normal.  Left Ear: External ear normal.  Eyes: Conjunctivae and EOM are normal.  Neck: Normal range of motion.  Cardiovascular: Normal rate and normal heart sounds.  Pulmonary/Chest: Breath sounds normal.  Abdominal: Bowel sounds are normal.  Musculoskeletal: Normal range of motion. He exhibits no edema or tenderness.  Neurological: He is alert and oriented to person, place, and time.  Psychiatric: He has a normal mood and affect. His behavior is normal.          Assessment & Plan:   Situational stress Labile  hypertension Muscle contraction headache.  Largely resolved  Patient reassured Exam unremarkable Patient report any new or worsening symptoms CPX in May as scheduled Continue home blood pressure monitoring  [id number],PETER Pilar Plate

## 2017-09-23 NOTE — Patient Instructions (Signed)
Limit your sodium (Salt) intake  Please check your blood pressure on a regular basis.  If it is consistently greater than 140/90, please make an office appointment.    It is important that you exercise regularly, at least 20 minutes 3 to 4 times per week.  If you develop chest pain or shortness of breath seek  medical attention.

## 2017-10-01 DIAGNOSIS — H2513 Age-related nuclear cataract, bilateral: Secondary | ICD-10-CM | POA: Diagnosis not present

## 2017-10-01 DIAGNOSIS — T1501XA Foreign body in cornea, right eye, initial encounter: Secondary | ICD-10-CM | POA: Diagnosis not present

## 2017-10-01 DIAGNOSIS — H353231 Exudative age-related macular degeneration, bilateral, with active choroidal neovascularization: Secondary | ICD-10-CM | POA: Diagnosis not present

## 2017-10-05 DIAGNOSIS — M5136 Other intervertebral disc degeneration, lumbar region: Secondary | ICD-10-CM | POA: Insufficient documentation

## 2017-10-05 DIAGNOSIS — Z96649 Presence of unspecified artificial hip joint: Secondary | ICD-10-CM | POA: Insufficient documentation

## 2017-10-05 DIAGNOSIS — Z96642 Presence of left artificial hip joint: Secondary | ICD-10-CM | POA: Diagnosis not present

## 2017-10-07 DIAGNOSIS — H35371 Puckering of macula, right eye: Secondary | ICD-10-CM | POA: Diagnosis not present

## 2017-10-07 DIAGNOSIS — H353211 Exudative age-related macular degeneration, right eye, with active choroidal neovascularization: Secondary | ICD-10-CM | POA: Diagnosis not present

## 2017-10-07 DIAGNOSIS — H353231 Exudative age-related macular degeneration, bilateral, with active choroidal neovascularization: Secondary | ICD-10-CM | POA: Diagnosis not present

## 2017-10-25 ENCOUNTER — Ambulatory Visit: Payer: Medicare PPO | Admitting: Family Medicine

## 2017-10-25 ENCOUNTER — Encounter: Payer: Self-pay | Admitting: Family Medicine

## 2017-10-25 VITALS — BP 120/70 | HR 85 | Temp 98.1°F | Ht 64.0 in | Wt 176.7 lb

## 2017-10-25 DIAGNOSIS — R350 Frequency of micturition: Secondary | ICD-10-CM | POA: Diagnosis not present

## 2017-10-25 DIAGNOSIS — R102 Pelvic and perineal pain: Secondary | ICD-10-CM

## 2017-10-25 LAB — CBC WITH DIFFERENTIAL/PLATELET
BASOS ABS: 0 10*3/uL (ref 0.0–0.1)
BASOS PCT: 0.5 % (ref 0.0–3.0)
EOS ABS: 0.1 10*3/uL (ref 0.0–0.7)
Eosinophils Relative: 1 % (ref 0.0–5.0)
HEMATOCRIT: 39.8 % (ref 39.0–52.0)
Hemoglobin: 13.5 g/dL (ref 13.0–17.0)
LYMPHS ABS: 1.5 10*3/uL (ref 0.7–4.0)
Lymphocytes Relative: 20.3 % (ref 12.0–46.0)
MCHC: 33.9 g/dL (ref 30.0–36.0)
MCV: 89.1 fl (ref 78.0–100.0)
MONO ABS: 0.7 10*3/uL (ref 0.1–1.0)
Monocytes Relative: 9.4 % (ref 3.0–12.0)
NEUTROS PCT: 68.8 % (ref 43.0–77.0)
Neutro Abs: 5.2 10*3/uL (ref 1.4–7.7)
PLATELETS: 247 10*3/uL (ref 150.0–400.0)
RBC: 4.47 Mil/uL (ref 4.22–5.81)
RDW: 14 % (ref 11.5–15.5)
WBC: 7.6 10*3/uL (ref 4.0–10.5)

## 2017-10-25 LAB — POC URINALSYSI DIPSTICK (AUTOMATED)
Bilirubin, UA: NEGATIVE
Glucose, UA: NEGATIVE
KETONES UA: NEGATIVE
LEUKOCYTES UA: NEGATIVE
Nitrite, UA: NEGATIVE
PH UA: 6 (ref 5.0–8.0)
PROTEIN UA: NEGATIVE
RBC UA: NEGATIVE
SPEC GRAV UA: 1.01 (ref 1.010–1.025)
Urobilinogen, UA: 0.2 E.U./dL

## 2017-10-25 LAB — COMPREHENSIVE METABOLIC PANEL
ALT: 15 U/L (ref 0–53)
AST: 13 U/L (ref 0–37)
Albumin: 3.8 g/dL (ref 3.5–5.2)
Alkaline Phosphatase: 83 U/L (ref 39–117)
BUN: 13 mg/dL (ref 6–23)
CALCIUM: 9.1 mg/dL (ref 8.4–10.5)
CHLORIDE: 102 meq/L (ref 96–112)
CO2: 26 meq/L (ref 19–32)
Creatinine, Ser: 0.72 mg/dL (ref 0.40–1.50)
GFR: 110.71 mL/min (ref >60.00)
Glucose, Bld: 99 mg/dL (ref 70–99)
POTASSIUM: 4.2 meq/L (ref 3.5–5.1)
Sodium: 137 mEq/L (ref 135–145)
Total Bilirubin: 0.7 mg/dL (ref 0.2–1.2)
Total Protein: 6.9 g/dL (ref 6.0–8.3)

## 2017-10-25 LAB — TSH: TSH: 0.55 u[IU]/mL (ref 0.35–4.50)

## 2017-10-25 LAB — LIPASE: LIPASE: 33 U/L (ref 11.0–59.0)

## 2017-10-25 NOTE — Patient Instructions (Addendum)
We will obtain labs and x-ray this visit.  He should follow-up next week with your PCP. Abdominal Pain, Adult Abdominal pain can be caused by many things. Often, abdominal pain is not serious and it gets better with no treatment or by being treated at home. However, sometimes abdominal pain is serious. Your health care provider will do a medical history and a physical exam to try to determine the cause of your abdominal pain. Follow these instructions at home:  Take over-the-counter and prescription medicines only as told by your health care provider. Do not take a laxative unless told by your health care provider.  Drink enough fluid to keep your urine clear or pale yellow.  Watch your condition for any changes.  Keep all follow-up visits as told by your health care provider. This is important. Contact a health care provider if:  Your abdominal pain changes or gets worse.  You are not hungry or you lose weight without trying.  You are constipated or have diarrhea for more than 2-3 days.  You have pain when you urinate or have a bowel movement.  Your abdominal pain wakes you up at night.  Your pain gets worse with meals, after eating, or with certain foods.  You are throwing up and cannot keep anything down.  You have a fever. Get help right away if:  Your pain does not go away as soon as your health care provider told you to expect.  You cannot stop throwing up.  Your pain is only in areas of the abdomen, such as the right side or the left lower portion of the abdomen.  You have bloody or black stools, or stools that look like tar.  You have severe pain, cramping, or bloating in your abdomen.  You have signs of dehydration, such as: ? Dark urine, very little urine, or no urine. ? Cracked lips. ? Dry mouth. ? Sunken eyes. ? Sleepiness. ? Weakness. This information is not intended to replace advice given to you by your health care provider. Make sure you discuss any  questions you have with your health care provider. Document Released: 06/03/2005 Document Revised: 03/13/2016 Document Reviewed: 02/05/2016 Elsevier Interactive Patient Education  [employer].

## 2017-10-25 NOTE — Progress Notes (Signed)
Subjective:    Patient ID: Jay Webster, male    DOB: September 19, 1931, 84 y.o.   MRN: [id number]  Chief Complaint  Patient presents with  . Abdominal Pain    for 3 days  . Nocturia    HPI Patient was seen today for acute concern.  Pt with suprapubic abdominal pain and urinary frequency x 3 days.  Pt has taken Tylenol for this.  Pt also endorses flatus. Pt's last BM was today, noted as soft.  Pt denies constipation or having to strain.  Pain is a 4-5/10, pt notices the pain when up moving around.  Patient endorses a previous history of kidney stones for which he takes allopurinol.  Patient denies frequent use of antacids.  Patient states he had his appendix removed as a child.  Past Medical History:  Diagnosis Date  . Arthritis    OA AND PAIN LEFT HIP AND OTHER JOINT PAINS  . Cancer (HCC)    THYROID CANCER - HAD THYROID REMOVED  . History of kidney stones   . History of shingles    RESOLVED  . Hypertension   . Hypothyroidism   . Low back pain   . Macular degeneration of both eyes   . RBBB (right bundle branch block with left anterior fascicular block)   . Seasonal allergies   . Thyroid disease   . Uric acid renal calculus 06/20/2007   Qualifier: Diagnosis of  By: Rogue Bussing CMA, Jacqualynn      Allergies  Allergen Reactions  . Penicillins Swelling and Palpitations    Swelling of joints.    ROS General: Denies fever, chills, night sweats, changes in weight, changes in appetite HEENT: Denies headaches, ear pain, changes in vision, rhinorrhea, sore throat CV: Denies CP, palpitations, SOB, orthopnea Pulm: Denies SOB, cough, wheezing GI: Denies nausea, vomiting, diarrhea, constipation   +abdominal pain, flatus GU: Denies dysuria, hematuria, vaginal discharge  +frequency Msk: Denies muscle cramps, joint pains Neuro: Denies weakness, numbness, tingling Skin: Denies rashes, bruising Psych: Denies depression, anxiety, hallucinations     Objective:    Blood pressure 120/70,  pulse 85, temperature 98.1 F (36.7 C), temperature source Oral, height 5\' 4"  (1.626 m), weight 176 lb 11.2 oz (80.2 kg), SpO2 97 %.   Gen. Pleasant, well-nourished, in no distress, normal affect   HEENT: [place]/AT, face symmetric,  no scleral icterus, PERRLA,nares patent without drainage Lungs: no accessory muscle use, CTAB, no wheezes or rales Cardiovascular: RRR, no m/r/g, no peripheral edema Abdomen: BS hyperactive, soft, ND, mild TTP in RLQ>LLQ no hepatosplenomegaly.   Wt Readings from Last 3 Encounters:  10/25/17 176 lb 11.2 oz (80.2 kg)  09/23/17 178 lb 3.2 oz (80.8 kg)  08/09/17 175 lb 9.6 oz (79.7 kg)    Lab Results  Component Value Date   WBC 4.6 01/01/2017   HGB 13.1 01/01/2017   HCT 38.6 (L) 01/01/2017   PLT 217.0 01/01/2017   GLUCOSE 102 (H) 01/01/2017   CHOL 128 01/01/2017   TRIG 33.0 01/01/2017   HDL 50.50 01/01/2017   LDLCALC 71 01/01/2017   ALT 16 01/01/2017   AST 15 01/01/2017   NA 140 01/01/2017   K 4.5 01/01/2017   CL 106 01/01/2017   CREATININE 0.76 01/01/2017   BUN 19 01/01/2017   CO2 28 01/01/2017   TSH 0.26 (L) 01/01/2017   PSA 1.07 12/31/2015   INR 1.03 05/16/2013    Assessment/Plan:  Suprapubic pain -UA negative -will obtain labs and xray - Plan: TSH, CBC  with Differential/Platelet, Comprehensive metabolic panel, Lipase, DG Abd 2 Views, CBC with Differential/Platelet, CANCELED: DG Abd 2 Views  Urine frequency  -UA negative - Plan: POCT Urinalysis Dipstick (Automated)  F/u in the next few days to 1 wk with pcp  Grier Mitts, MD

## 2017-10-26 ENCOUNTER — Ambulatory Visit (INDEPENDENT_AMBULATORY_CARE_PROVIDER_SITE_OTHER)
Admission: RE | Admit: 2017-10-26 | Discharge: 2017-10-26 | Disposition: A | Discharge status: Home / Self Care | Payer: Medicare PPO | Source: Ambulatory Visit | Attending: Family Medicine | Admitting: Family Medicine

## 2017-10-26 DIAGNOSIS — R102 Pelvic and perineal pain: Secondary | ICD-10-CM

## 2017-10-26 DIAGNOSIS — R109 Unspecified abdominal pain: Secondary | ICD-10-CM | POA: Diagnosis not present

## 2017-10-27 ENCOUNTER — Encounter: Payer: Self-pay | Admitting: Family Medicine

## 2017-11-18 DIAGNOSIS — H353231 Exudative age-related macular degeneration, bilateral, with active choroidal neovascularization: Secondary | ICD-10-CM | POA: Diagnosis not present

## 2017-11-18 DIAGNOSIS — H353211 Exudative age-related macular degeneration, right eye, with active choroidal neovascularization: Secondary | ICD-10-CM | POA: Diagnosis not present

## 2017-11-18 DIAGNOSIS — H35362 Drusen (degenerative) of macula, left eye: Secondary | ICD-10-CM | POA: Diagnosis not present

## 2017-12-22 ENCOUNTER — Encounter: Payer: Self-pay | Admitting: Internal Medicine

## 2017-12-22 ENCOUNTER — Ambulatory Visit: Payer: Medicare PPO | Admitting: Internal Medicine

## 2017-12-22 VITALS — BP 116/58 | HR 80 | Temp 98.1°F | Wt 178.0 lb

## 2017-12-22 DIAGNOSIS — L723 Sebaceous cyst: Secondary | ICD-10-CM | POA: Diagnosis not present

## 2017-12-22 NOTE — Progress Notes (Signed)
Subjective:    Patient ID: Jay Webster, male    DOB: 09-Dec-1931, 84 y.o.   MRN: [id number]  HPI  84 year old patient who is seen today concerned about a lesion in the perirectal area.  He first noted a painless lump about 1 month ago this has not changed since that time. No change in bowel habits or rectal bleeding Last colonoscopy was approximately 19 years ago  Past Medical History:  Diagnosis Date  . Arthritis    OA AND PAIN LEFT HIP AND OTHER JOINT PAINS  . Cancer (HCC)    THYROID CANCER - HAD THYROID REMOVED  . History of kidney stones   . History of shingles    RESOLVED  . Hypertension   . Hypothyroidism   . Low back pain   . Macular degeneration of both eyes   . RBBB (right bundle branch block with left anterior fascicular block)   . Seasonal allergies   . Thyroid disease   . Uric acid renal calculus 06/20/2007   Qualifier: Diagnosis of  By: Rogue Bussing CMA, Maryann Alar       Social History   Socioeconomic History  . Marital status: Married    Spouse name: Not on file  . Number of children: Not on file  . Years of education: Not on file  . Highest education level: Not on file  Occupational History  . Occupation: retired  Scientific laboratory technician  . Financial resource strain: Not on file  . Food insecurity:    Worry: Not on file    Inability: Not on file  . Transportation needs:    Medical: Not on file    Non-medical: Not on file  Tobacco Use  . Smoking status: Former Smoker    Types: Cigarettes    Last attempt to quit: 02/22/1979    Years since quitting: 38.8  . Smokeless tobacco: Never Used  Substance and Sexual Activity  . Alcohol use: Yes    Alcohol/week: 0.0 oz  . Drug use: No  . Sexual activity: Yes  Lifestyle  . Physical activity:    Days per week: Not on file    Minutes per session: Not on file  . Stress: Not on file  Relationships  . Social connections:    Talks on phone: Not on file    Gets together: Not on file    Attends religious service:  Not on file    Active member of club or organization: Not on file    Attends meetings of clubs or organizations: Not on file    Relationship status: Not on file  . Intimate partner violence:    Fear of current or ex partner: Not on file    Emotionally abused: Not on file    Physically abused: Not on file    Forced sexual activity: Not on file  Other Topics Concern  . Not on file  Social History Narrative  . Not on file    Past Surgical History:  Procedure Laterality Date  . APPENDECTOMY  1948  . COLONOSCOPY    . Elbow Radical Reduction  1973  . [place]  . REMOVAL OF FIRST RIB   NOV 1994   FOR COMPRESSED VEIN BETWEEN 1 ST RIB AND COLLARBONE  . Rib removed, 1st  1994  . ROTATOR CUFF REPAIR  2009  . THYROIDECTOMY  2010  . TOTAL HIP ARTHROPLASTY Left 05/31/2013   Procedure: LEFT TOTAL HIP ARTHROPLASTY ANTERIOR APPROACH;  Surgeon: Dione Plover  Aluisio, MD;  Location: [facility];  Service: Orthopedics;  Laterality: Left;    Family History  Problem Relation Age of Onset  . Anemia Unknown   . Thyroid disease Unknown   . Heart disease Mother   . Diabetes Mother     Allergies  Allergen Reactions  . Penicillins Swelling and Palpitations    Swelling of joints.    Current Outpatient Medications on File Prior to Visit  Medication Sig Dispense Refill  . Aflibercept (EYLEA) 2 MG/0.05ML SOLN Inject into the eye.    Marland Kitchen allopurinol (ZYLOPRIM) 300 MG tablet TAKE 1 TABLET (300 MG TOTAL) BY MOUTH DAILY. 90 tablet 1  . benazepril (LOTENSIN) 20 MG tablet TAKE 1 TABLET (20 MG TOTAL) BY MOUTH DAILY. 90 tablet 1  . levothyroxine (SYNTHROID, LEVOTHROID) 150 MCG tablet TAKE 1 TABLET (150 MCG TOTAL) BY MOUTH DAILY. 90 tablet 1  . Multiple Vitamin (MULTIVITAMIN) tablet Take 1 tablet by mouth daily.    . Multiple Vitamins-Minerals (PRESERVISION AREDS 2 PO) Take 1 tablet by mouth 2 (two) times daily.     No current facility-administered medications on file prior to visit.     BP  (!) 116/58 (BP Location: Right Arm, Patient Position: Sitting, Cuff Size: Large)   Pulse 80   Temp 98.1 F (36.7 C) (Oral)   Wt 178 lb (80.7 kg)   SpO2 98%   BMI 30.55 kg/m     Review of Systems  Constitutional: Negative for appetite change, chills, fatigue and fever.  HENT: Negative for congestion, dental problem, ear pain, hearing loss, sore throat, tinnitus, trouble swallowing and voice change.   Eyes: Negative for pain, discharge and visual disturbance.  Respiratory: Negative for cough, chest tightness, wheezing and stridor.   Cardiovascular: Negative for chest pain, palpitations and leg swelling.  Gastrointestinal: Negative for abdominal distention, abdominal pain, blood in stool, constipation, diarrhea, nausea and vomiting.  Genitourinary: Negative for difficulty urinating, discharge, flank pain, genital sores, hematuria and urgency.  Musculoskeletal: Negative for arthralgias, back pain, gait problem, joint swelling, myalgias and neck stiffness.  Skin: Positive for rash.  Neurological: Negative for dizziness, syncope, speech difficulty, weakness, numbness and headaches.  Hematological: Negative for adenopathy. Does not bruise/bleed easily.  Psychiatric/Behavioral: Negative for behavioral problems and dysphoric mood. The patient is not nervous/anxious.        Objective:   Physical Exam  Constitutional: He appears well-developed and well-nourished. No distress.  Genitourinary: Penis normal.  Genitourinary Comments: Prostate +2 enlarged Stool hematest negative Perirectal exam normal No external hemorrhoids Patient did have a subcutaneous nodule involving the inner aspect of the left gluteal fold.  This was approximately 1.5 cm and nontender.  Consistent with a sebaceous cyst          Assessment & Plan:   Sebaceous cyst left buttock area near the perineum.  Patient reassured.  Patient does have a physical scheduled in a couple months and will reassess at that  time  Nyoka Cowden

## 2017-12-22 NOTE — Patient Instructions (Signed)
Return as scheduled for your annual exam  Call or return to clinic prn if these symptoms worsen or fail to improve as anticipated.  

## 2018-01-06 DIAGNOSIS — H35361 Drusen (degenerative) of macula, right eye: Secondary | ICD-10-CM | POA: Diagnosis not present

## 2018-01-06 DIAGNOSIS — H353231 Exudative age-related macular degeneration, bilateral, with active choroidal neovascularization: Secondary | ICD-10-CM | POA: Diagnosis not present

## 2018-01-06 DIAGNOSIS — H353211 Exudative age-related macular degeneration, right eye, with active choroidal neovascularization: Secondary | ICD-10-CM | POA: Diagnosis not present

## 2018-01-17 ENCOUNTER — Ambulatory Visit (INDEPENDENT_AMBULATORY_CARE_PROVIDER_SITE_OTHER): Payer: Medicare PPO | Admitting: Internal Medicine

## 2018-01-17 ENCOUNTER — Other Ambulatory Visit: Payer: Self-pay

## 2018-01-17 ENCOUNTER — Encounter: Payer: Self-pay | Admitting: Internal Medicine

## 2018-01-17 VITALS — BP 120/64 | Wt 178.0 lb

## 2018-01-17 DIAGNOSIS — M150 Primary generalized (osteo)arthritis: Secondary | ICD-10-CM

## 2018-01-17 DIAGNOSIS — N200 Calculus of kidney: Secondary | ICD-10-CM | POA: Diagnosis not present

## 2018-01-17 DIAGNOSIS — M159 Polyosteoarthritis, unspecified: Secondary | ICD-10-CM

## 2018-01-17 DIAGNOSIS — E890 Postprocedural hypothyroidism: Secondary | ICD-10-CM | POA: Diagnosis not present

## 2018-01-17 DIAGNOSIS — C73 Malignant neoplasm of thyroid gland: Secondary | ICD-10-CM

## 2018-01-17 DIAGNOSIS — Z0000 Encounter for general adult medical examination without abnormal findings: Secondary | ICD-10-CM

## 2018-01-17 DIAGNOSIS — I10 Essential (primary) hypertension: Secondary | ICD-10-CM | POA: Diagnosis not present

## 2018-01-17 LAB — COMPREHENSIVE METABOLIC PANEL
ALT: 14 U/L (ref 0–53)
AST: 14 U/L (ref 0–37)
Albumin: 3.7 g/dL (ref 3.5–5.2)
Alkaline Phosphatase: 74 U/L (ref 39–117)
BUN: 14 mg/dL (ref 6–23)
CHLORIDE: 104 meq/L (ref 96–112)
CO2: 28 mEq/L (ref 19–32)
Calcium: 8.8 mg/dL (ref 8.4–10.5)
Creatinine, Ser: 0.67 mg/dL (ref 0.40–1.50)
GFR: 120.23 mL/min (ref >60.00)
GLUCOSE: 93 mg/dL (ref 70–99)
POTASSIUM: 4.1 meq/L (ref 3.5–5.1)
SODIUM: 138 meq/L (ref 135–145)
Total Bilirubin: 0.5 mg/dL (ref 0.2–1.2)
Total Protein: 6.8 g/dL (ref 6.0–8.3)

## 2018-01-17 LAB — CBC WITH DIFFERENTIAL/PLATELET
Basophils Absolute: 0.1 10*3/uL (ref 0.0–0.1)
Basophils Relative: 1.1 % (ref 0.0–3.0)
EOS PCT: 3 % (ref 0.0–5.0)
Eosinophils Absolute: 0.2 10*3/uL (ref 0.0–0.7)
HEMATOCRIT: 36.7 % — AB (ref 39.0–52.0)
Hemoglobin: 12.7 g/dL — ABNORMAL LOW (ref 13.0–17.0)
LYMPHS ABS: 1.4 10*3/uL (ref 0.7–4.0)
LYMPHS PCT: 25.9 % (ref 12.0–46.0)
MCHC: 34.6 g/dL (ref 30.0–36.0)
MCV: 87.6 fl (ref 78.0–100.0)
MONOS PCT: 8.4 % (ref 3.0–12.0)
Monocytes Absolute: 0.4 10*3/uL (ref 0.1–1.0)
NEUTROS PCT: 61.6 % (ref 43.0–77.0)
Neutro Abs: 3.2 10*3/uL (ref 1.4–7.7)
Platelets: 262 10*3/uL (ref 150.0–400.0)
RBC: 4.2 Mil/uL — ABNORMAL LOW (ref 4.22–5.81)
RDW: 14 % (ref 11.5–15.5)
WBC: 5.3 10*3/uL (ref 4.0–10.5)

## 2018-01-17 LAB — LIPID PANEL
CHOL/HDL RATIO: 2
Cholesterol: 123 mg/dL (ref 0–200)
HDL: 49.4 mg/dL (ref >39.00)
LDL CALC: 60 mg/dL (ref 0–99)
NONHDL: 73.16
Triglycerides: 67 mg/dL (ref 0.0–149.0)
VLDL: 13.4 mg/dL (ref 0.0–40.0)

## 2018-01-17 LAB — URIC ACID: Uric Acid, Serum: 3.5 mg/dL — ABNORMAL LOW (ref 4.0–7.8)

## 2018-01-17 LAB — TSH: TSH: 0.17 u[IU]/mL — ABNORMAL LOW (ref 0.35–4.50)

## 2018-01-17 MED ORDER — LEVOTHYROXINE SODIUM 125 MCG PO TABS: 125.0000 ug | ORAL_TABLET | Freq: Every day | ORAL | 0 refills | Status: DC

## 2018-01-17 MED ORDER — LEVOTHYROXINE SODIUM 125 MCG PO TABS: 125.0000 ug | ORAL_TABLET | Freq: Every day | ORAL | 3 refills | Status: DC

## 2018-01-17 NOTE — Progress Notes (Signed)
Subjective:    Patient ID: [place], male    DOB: 1931-11-27, 84 y.o.   MRN: [id number]  HPI  84 year old patient who is seen today for annual health assessment and subsequent Medicare wellness visit He has a history of postsurgical hypothyroidism and prior history of papillary thyroid cancer.  He has osteoarthritis.  He is followed closely by ophthalmology. He has a history of uric acid nephrolithiasis as well as osteoarthritis.  He has essential hypertension.  Past Medical History:  Diagnosis Date  . Arthritis    OA AND PAIN LEFT HIP AND OTHER JOINT PAINS  . Cancer (HCC)    THYROID CANCER - HAD THYROID REMOVED  . History of kidney stones   . History of shingles    RESOLVED  . Hypertension   . Hypothyroidism   . Low back pain   . Macular degeneration of both eyes   . RBBB (right bundle branch block with left anterior fascicular block)   . Seasonal allergies   . Thyroid disease   . Uric acid renal calculus 06/20/2007   Qualifier: Diagnosis of  By: Rogue Bussing CMA, Maryann Alar       Social History   Socioeconomic History  . Marital status: Married    Spouse name: Not on file  . Number of children: Not on file  . Years of education: Not on file  . Highest education level: Not on file  Occupational History  . Occupation: retired  Scientific laboratory technician  . Financial resource strain: Not on file  . Food insecurity:    Worry: Not on file    Inability: Not on file  . Transportation needs:    Medical: Not on file    Non-medical: Not on file  Tobacco Use  . Smoking status: Former Smoker    Types: Cigarettes    Last attempt to quit: 02/22/1979    Years since quitting: 38.9  . Smokeless tobacco: Never Used  Substance and Sexual Activity  . Alcohol use: Yes    Alcohol/week: 0.0 oz  . Drug use: No  . Sexual activity: Yes  Lifestyle  . Physical activity:    Days per week: Not on file    Minutes per session: Not on file  . Stress: Not on file  Relationships  . Social  connections:    Talks on phone: Not on file    Gets together: Not on file    Attends religious service: Not on file    Active member of club or organization: Not on file    Attends meetings of clubs or organizations: Not on file    Relationship status: Not on file  . Intimate partner violence:    Fear of current or ex partner: Not on file    Emotionally abused: Not on file    Physically abused: Not on file    Forced sexual activity: Not on file  Other Topics Concern  . Not on file  Social History Narrative  . Not on file    Past Surgical History:  Procedure Laterality Date  . APPENDECTOMY  1948  . COLONOSCOPY    . Elbow Radical Reduction  1973  . [place]  . REMOVAL OF FIRST RIB   NOV 1994   FOR COMPRESSED VEIN BETWEEN 1 ST RIB AND COLLARBONE  . Rib removed, 1st  1994  . ROTATOR CUFF REPAIR  2009  . THYROIDECTOMY  2010  . TOTAL HIP ARTHROPLASTY Left 05/31/2013   Procedure:  LEFT TOTAL HIP ARTHROPLASTY ANTERIOR APPROACH;  Surgeon: Gearlean Alf, MD;  Location: [facility];  Service: Orthopedics;  Laterality: Left;    Family History  Problem Relation Age of Onset  . Anemia Unknown   . Thyroid disease Unknown   . Heart disease Mother   . Diabetes Mother     Allergies  Allergen Reactions  . Penicillins Swelling and Palpitations    Swelling of joints.    Current Outpatient Medications on File Prior to Visit  Medication Sig Dispense Refill  . Acetaminophen (TYLENOL) 325 MG CAPS Tylenol 325 mg capsule  Take by oral route.    . Aflibercept (EYLEA IZ) Eylea    . Aflibercept (EYLEA) 2 MG/0.05ML SOLN Inject into the eye.    Marland Kitchen allopurinol (ZYLOPRIM) 300 MG tablet TAKE 1 TABLET (300 MG TOTAL) BY MOUTH DAILY. 90 tablet 1  . benazepril (LOTENSIN) 20 MG tablet TAKE 1 TABLET (20 MG TOTAL) BY MOUTH DAILY. 90 tablet 1  . cetirizine (ZYRTEC) 10 MG tablet Take by mouth.    Marland Kitchen glucosamine-chondroitin 500-400 MG tablet Take by mouth.    . levothyroxine (SYNTHROID,  LEVOTHROID) 150 MCG tablet TAKE 1 TABLET (150 MCG TOTAL) BY MOUTH DAILY. 90 tablet 1  . Misc Natural Products (OSTEO BI-FLEX/5-LOXIN ADVANCED PO) Osteo Bi-Flex    . Multiple Vitamin (MULTIVITAMIN) tablet Take 1 tablet by mouth daily.    . Multiple Vitamins-Minerals (PRESERVISION AREDS 2 PO) Take 1 tablet by mouth 2 (two) times daily.     No current facility-administered medications on file prior to visit.     BP 120/64 (BP Location: Left Arm, Patient Position: Sitting, Cuff Size: Large)   Wt 178 lb (80.7 kg)   BMI 30.55 kg/m   Subsequent Medicare wellness visit  1. Risk factors, based on past  M,S,F history.  Cardiovascular risk factors include a history of hypertension  2.  Physical activities: Fairly active no exercise limitations.  Fairly sedentary due to arthritis and age  22.  Depression/mood: No history major depression or mood disorder  4.  Hearing: Uses bilateral hearing aids  5.  ADL's: Independent  6.  Fall risk: Increased slightly due to arthritis and visual impairment  7.  Home safety: No problems identified  8.  Height weight, and visual acuity; height and weight stable followed closely by ophthalmology  9.  Counseling: Continue heart healthy diet and active lifestyle  10. Lab orders based on risk factors: Laboratory update will be reviewed  11. Referral : Follow-up audiology and ophthalmology  12. Care plan: Continue efforts at aggressive risk factor modification  13. Cognitive assessment: Alert and appropriate normal affect.  No cognitive dysfunction  14. Screening: Patient provided with a written and personalized 5-10 year screening schedule in the AVS.    15. Provider List Update: Ophthalmology audiology primary care    Review of Systems  Constitutional: Negative for appetite change, chills, fatigue and fever.  HENT: Negative for congestion, dental problem, ear pain, hearing loss, sore throat, tinnitus, trouble swallowing and voice change.   Eyes:  Negative for pain, discharge and visual disturbance.  Respiratory: Negative for cough, chest tightness, wheezing and stridor.   Cardiovascular: Negative for chest pain, palpitations and leg swelling.  Gastrointestinal: Negative for abdominal distention, abdominal pain, blood in stool, constipation, diarrhea, nausea and vomiting.  Genitourinary: Negative for difficulty urinating, discharge, flank pain, genital sores, hematuria and urgency.  Musculoskeletal: Negative for arthralgias, back pain, gait problem, joint swelling, myalgias and neck stiffness.  Skin: Negative for  rash.  Neurological: Negative for dizziness, syncope, speech difficulty, weakness, numbness and headaches.  Hematological: Negative for adenopathy. Does not bruise/bleed easily.  Psychiatric/Behavioral: Negative for behavioral problems and dysphoric mood. The patient is not nervous/anxious.        Objective:   Physical Exam  Constitutional: He is oriented to person, place, and time. He appears well-developed.  HENT:  Head: Normocephalic.  Right Ear: External ear normal.  Left Ear: External ear normal.  Hearing aids in place bilaterally  Eyes: Conjunctivae and EOM are normal.  Neck: Normal range of motion.  Thyroidectomy scar  Cardiovascular: Normal rate and normal heart sounds.  Pulmonary/Chest: Breath sounds normal.  Abdominal: Bowel sounds are normal.  Musculoskeletal: Normal range of motion. He exhibits no edema or tenderness.  Neurological: He is alert and oriented to person, place, and time.  Skin: Skin is warm and dry.  Subcutaneous mass in the high left intergluteal area in the perineum consistent with a sebaceous cyst  Surgical scars in the right posterior chest wall right lower quadrant.  Status post thyroidectomy scar  Psychiatric: He has a normal mood and affect. His behavior is normal.          Assessment & Plan:   Preventive health examination.  Will review updated lab Subsequent Medicare  wellness visit Status post thyroidectomy.  Will review a TSH Essential hypertension stable Osteoarthritis Uric acid stones.  Will check a uric acid level  Follow-up 6 months  [id number],PETER Pilar Plate

## 2018-01-17 NOTE — Patient Instructions (Signed)
Limit your sodium (Salt) intake  Please check your blood pressure on a regular basis.  If it is consistently greater than 150/90, please make an office appointment.    It is important that you exercise regularly, at least 20 minutes 3 to 4 times per week.  If you develop chest pain or shortness of breath seek  medical attention.  Return in 6 months for follow-up  

## 2018-02-17 DIAGNOSIS — H35371 Puckering of macula, right eye: Secondary | ICD-10-CM | POA: Diagnosis not present

## 2018-02-17 DIAGNOSIS — H353211 Exudative age-related macular degeneration, right eye, with active choroidal neovascularization: Secondary | ICD-10-CM | POA: Diagnosis not present

## 2018-02-17 DIAGNOSIS — H353231 Exudative age-related macular degeneration, bilateral, with active choroidal neovascularization: Secondary | ICD-10-CM | POA: Diagnosis not present

## 2018-02-17 DIAGNOSIS — H35361 Drusen (degenerative) of macula, right eye: Secondary | ICD-10-CM | POA: Diagnosis not present

## 2018-02-21 ENCOUNTER — Encounter (HOSPITAL_COMMUNITY): Payer: Self-pay | Admitting: *Deleted

## 2018-02-21 ENCOUNTER — Ambulatory Visit: Payer: Self-pay | Admitting: Surgery

## 2018-02-21 DIAGNOSIS — N509 Disorder of male genital organs, unspecified: Secondary | ICD-10-CM | POA: Diagnosis not present

## 2018-02-25 NOTE — Patient Instructions (Addendum)
Jay Webster  02/25/2018   Your procedure is scheduled on: 03-11-18   Report to [name] Main  Entrance    Report to admitting at 8:00AM    Call this number if you have problems the morning of surgery [phone]     Remember: Do not eat food or drink liquids :After Midnight.     Take these medicines the morning of surgery with A SIP OF WATER: none                                 You may not have any metal on your body including hair pins and              piercings  Do not wear jewelry, make-up, lotions, powders or perfumes, deodorant              Men may shave face and neck.   Do not bring valuables to the hospital. [place].  Contacts, dentures or bridgework may not be worn into surgery.  Leave suitcase in the car. After surgery it may be brought to your room.                  Please read over the following fact sheets you were given: _____________________________________________________________________             [facility] - Preparing for Surgery Before surgery, you can play an important role.  Because skin is not sterile, your skin needs to be as free of germs as possible.  You can reduce the number of germs on your skin by washing with CHG (chlorahexidine gluconate) soap before surgery.  CHG is an antiseptic cleaner which kills germs and bonds with the skin to continue killing germs even after washing. Please DO NOT use if you have an allergy to CHG or antibacterial soaps.  If your skin becomes reddened/irritated stop using the CHG and inform your nurse when you arrive at Short Stay. Do not shave (including legs and underarms) for at least 48 hours prior to the first CHG shower.  You may shave your face/neck. Please follow these instructions carefully:  1.  Shower with CHG Soap the night before surgery and the  morning of Surgery.  2.  If you choose to wash your hair, wash your hair  first as usual with your  normal  shampoo.  3.  After you shampoo, rinse your hair and body thoroughly to remove the  shampoo.                           4.  Use CHG as you would any other liquid soap.  You can apply chg directly  to the skin and wash                       Gently with a scrungie or clean washcloth.  5.  Apply the CHG Soap to your body ONLY FROM THE NECK DOWN.   Do not use on face/ open                           Wound or open sores.  Avoid contact with eyes, ears mouth and genitals (private parts).                       Wash face,  Genitals (private parts) with your normal soap.             6.  Wash thoroughly, paying special attention to the area where your surgery  will be performed.  7.  Thoroughly rinse your body with warm water from the neck down.  8.  DO NOT shower/wash with your normal soap after using and rinsing off  the CHG Soap.                9.  Pat yourself dry with a clean towel.            10.  Wear clean pajamas.            11.  Place clean sheets on your bed the night of your first shower and do not  sleep with pets. Day of Surgery : Do not apply any lotions/deodorants the morning of surgery.  Please wear clean clothes to the hospital/surgery center.  FAILURE TO FOLLOW THESE INSTRUCTIONS MAY RESULT IN THE CANCELLATION OF YOUR SURGERY PATIENT SIGNATURE_________________________________  NURSE SIGNATURE__________________________________  ________________________________________________________________________

## 2018-02-28 ENCOUNTER — Encounter (HOSPITAL_COMMUNITY)
Admission: RE | Admit: 2018-02-28 | Discharge: 2018-02-28 | Disposition: A | Discharge status: Home / Self Care | Payer: Medicare PPO | Source: Ambulatory Visit | Attending: Surgery | Admitting: Surgery

## 2018-02-28 ENCOUNTER — Encounter (HOSPITAL_COMMUNITY): Payer: Self-pay

## 2018-02-28 ENCOUNTER — Other Ambulatory Visit: Payer: Self-pay

## 2018-02-28 DIAGNOSIS — Z01812 Encounter for preprocedural laboratory examination: Secondary | ICD-10-CM | POA: Insufficient documentation

## 2018-02-28 DIAGNOSIS — I10 Essential (primary) hypertension: Secondary | ICD-10-CM | POA: Diagnosis not present

## 2018-02-28 DIAGNOSIS — Z01810 Encounter for preprocedural cardiovascular examination: Secondary | ICD-10-CM | POA: Insufficient documentation

## 2018-02-28 LAB — BASIC METABOLIC PANEL
Anion gap: 6 (ref 5–15)
BUN: 17 mg/dL (ref 6–20)
CO2: 28 mmol/L (ref 22–32)
CREATININE: 0.74 mg/dL (ref 0.61–1.24)
Calcium: 9.1 mg/dL (ref 8.9–10.3)
Chloride: 107 mmol/L (ref 101–111)
GFR calc non Af Amer: >60 mL/min (ref >60)
Glucose, Bld: 87 mg/dL (ref 65–99)
Potassium: 4.7 mmol/L (ref 3.5–5.1)
SODIUM: 141 mmol/L (ref 135–145)

## 2018-02-28 LAB — CBC
HEMATOCRIT: 38.3 % — AB (ref 39.0–52.0)
HEMOGLOBIN: 13 g/dL (ref 13.0–17.0)
MCH: 30 pg (ref 26.0–34.0)
MCHC: 33.9 g/dL (ref 30.0–36.0)
MCV: 88.2 fL (ref 78.0–100.0)
Platelets: 230 10*3/uL (ref 150–400)
RBC: 4.34 MIL/uL (ref 4.22–5.81)
RDW: 13.4 % (ref 11.5–15.5)
WBC: 5.3 10*3/uL (ref 4.0–10.5)

## 2018-03-08 ENCOUNTER — Encounter (HOSPITAL_COMMUNITY): Payer: Self-pay | Admitting: Surgery

## 2018-03-08 DIAGNOSIS — N5089 Other specified disorders of the male genital organs: Secondary | ICD-10-CM | POA: Diagnosis present

## 2018-03-08 NOTE — H&P (Signed)
General Surgery [facility]  Jay Webster DOB: [birth year] Married / Language: English / Race: White Male   History of Present Illness  The patient is a 84 year old male who presents with a soft tissue mass.  CHIEF COMPLAINT: soft tissue mass left perineum  Patient is referred by Dr. Beola Cord for surgical evaluation and management of a soft tissue mass involving the medial lower left buttock. This is been present for several months. It has increased in size. It causes discomfort. Patient has not noted any drainage. It has never been infected. He has never had any previous such lesions removed. Patient was evaluated in May by his primary care physician and referred to surgery for evaluation and management. Patient is well known to my practice from previous thyroidectomy in 2010.   Past Surgical History  Appendectomy  Oral Surgery  Thyroid Surgery   Diagnostic Studies History Colonoscopy  1-5 years ago  Allergies Penicillins  Allergies Reconciled   Medication History  Tylenol (325MG  Tablet, Oral) Active. Eylea (2MG /0.05ML Solution, Intraocular) Active. ZyrTEC Allergy (10MG  Tablet, Oral) Active. Levothroid (125MCG Tablet, Oral) Active. Medications Reconciled  Social History Alcohol use  Occasional alcohol use. Caffeine use  Coffee. No drug use  Tobacco use  Former smoker.  Family History Alcohol Abuse  Father. Diabetes Mellitus  Brother.  Other Problems High blood pressure  Kidney Stone  Thyroid Cancer  Thyroid Disease    Review of Systems General Not Present- Appetite Loss, Chills, Fatigue, Fever, Night Sweats, Weight Gain and Weight Loss. Skin Present- New Lesions. Not Present- Change in Wart/Mole, Dryness, Hives, Jaundice, Non-Healing Wounds, Rash and Ulcer. HEENT Not Present- Earache, Hearing Loss, Hoarseness, Nose Bleed, Oral Ulcers, Ringing in the Ears, Seasonal Allergies, Sinus Pain, Sore Throat,  Visual Disturbances, Wears glasses/contact lenses and Yellow Eyes. Respiratory Not Present- Bloody sputum, Chronic Cough, Difficulty Breathing, Snoring and Wheezing. Breast Not Present- Breast Mass, Breast Pain, Nipple Discharge and Skin Changes. Cardiovascular Not Present- Chest Pain, Difficulty Breathing Lying Down, Leg Cramps, Palpitations, Rapid Heart Rate, Shortness of Breath and Swelling of Extremities. Gastrointestinal Not Present- Abdominal Pain, Bloating, Bloody Stool, Change in Bowel Habits, Chronic diarrhea, Constipation, Difficulty Swallowing, Excessive gas, Gets full quickly at meals, Hemorrhoids, Indigestion, Nausea, Rectal Pain and Vomiting. Male Genitourinary Not Present- Blood in Urine, Change in Urinary Stream, Frequency, Impotence, Nocturia, Painful Urination, Urgency and Urine Leakage.  Vitals Weight: 179.6 lb Height: 64in Body Surface Area: 1.87 m Body Mass Index: 30.83 kg/m  Temp.: 98.42F  Pulse: 78 (Regular)  BP: 122/68 (Sitting, Left Arm, Standard)  Physical Exam  See vital signs recorded above  GENERAL APPEARANCE Development: normal Nutritional status: normal Gross deformities: none  SKIN Rash, lesions, ulcers: none Induration, erythema: none Nodules: none palpable  EYES Conjunctiva and lids: normal Pupils: equal and reactive Iris: normal bilaterally  EARS, NOSE, MOUTH, THROAT External ears: no lesion or deformity External nose: no lesion or deformity Hearing: grossly normal Lips: no lesion or deformity Dentition: normal for age Oral mucosa: moist  NECK Symmetric: yes Trachea: midline Thyroid: no palpable nodules in the thyroid bed; well-healed anterior cervical incision; soft tissue mass left mid neck consistent with lipoma  CHEST Respiratory effort: normal Retraction or accessory muscle use: no Breath sounds: normal bilaterally Rales, rhonchi, wheeze: none  CARDIOVASCULAR Auscultation: regular rhythm, normal rate Murmurs:  none Pulses: carotid and radial pulse 2+ palpable Lower extremity edema: none Lower extremity varicosities: none  ABDOMEN Distension: none Masses: none palpable Tenderness: none Hepatosplenomegaly:  not present Hernia: not present  GENITOURINARY Penis: no lesions Scrotum: no masses On the perineum in the left anterior position at the medial aspect of the gluteal crease is a soft tissue mass. This measures approximately 5 cm in greatest dimension. It is not fluctuant. There is no sign of infection. It is mildly tender to palpation. It is irregularly shaped.  MUSCULOSKELETAL Station and gait: normal Digits and nails: no clubbing or cyanosis Muscle strength: grossly normal all extremities Range of motion: grossly normal all extremities Deformity: none  LYMPHATIC Cervical: none palpable Supraclavicular: none palpable  PSYCHIATRIC Oriented to person, place, and time: yes Mood and affect: normal for situation Judgment and insight: appropriate for situation    Assessment & Plan  PERINEAL MASS IN MALE (N50.9)  Patient presents on referral from his primary care physician for evaluation of a mass in the perineum. This is been present for a few months and causes discomfort. He presents today for evaluation.  Patient has an irregularly shaped mass in the left anterior portion of the perineum. This may represent some type of epidermal inclusion cyst or sebaceous cyst. However, neoplasm cannot be excluded. I have recommended surgical excision both for control of symptoms and for definitive diagnosis. Due to the size, I would like to do this in the main operating room under general anesthesia. We will plan on an overnight stay for observation. The specimen will be submitted to pathology for review.  The risks and benefits of the procedure have been discussed at length with the patient. The patient understands the proposed procedure, potential alternative treatments, and the course  of recovery to be expected. All of the patient's questions have been answered at this time. The patient wishes to proceed with surgery.  Armandina Gemma, [facility] Office: [phone]

## 2018-03-10 NOTE — Anesthesia Preprocedure Evaluation (Addendum)
Anesthesia Evaluation  Patient identified by MRN, date of birth, ID band Patient awake    Reviewed: Allergy & Precautions, NPO status , Patient's Chart, lab work & pertinent test results  Airway Mallampati: II  TM Distance: >3 FB Neck ROM: Full    Dental  (+) Dental Advisory Given   Pulmonary former smoker,    breath sounds clear to auscultation       Cardiovascular hypertension, Pt. on medications  Rhythm:Regular Rate:Normal     Neuro/Psych negative neurological ROS     GI/Hepatic negative GI ROS, Neg liver ROS,   Endo/Other  Hypothyroidism   Renal/GU Renal disease     Musculoskeletal  (+) Arthritis ,   Abdominal   Peds  Hematology negative hematology ROS (+)   Anesthesia Other Findings   Reproductive/Obstetrics                            Lab Results  Component Value Date   WBC 5.3 02/28/2018   HGB 13.0 02/28/2018   HCT 38.3 (L) 02/28/2018   MCV 88.2 02/28/2018   PLT 230 02/28/2018   Lab Results  Component Value Date   CREATININE 0.74 02/28/2018   BUN 17 02/28/2018   NA 141 02/28/2018   K 4.7 02/28/2018   CL 107 02/28/2018   CO2 28 02/28/2018    Anesthesia Physical Anesthesia Plan  ASA: II  Anesthesia Plan: General   Post-op Pain Management:    Induction: Intravenous  PONV Risk Score and Plan: 2 and Ondansetron, Dexamethasone and Treatment may vary due to age or medical condition  Airway Management Planned: LMA  Additional Equipment:   Intra-op Plan:   Post-operative Plan: Extubation in OR  Informed Consent: I have reviewed the patients History and Physical, chart, labs and discussed the procedure including the risks, benefits and alternatives for the proposed anesthesia with the patient or authorized representative who has indicated his/her understanding and acceptance.   Dental advisory given  Plan Discussed with: CRNA  Anesthesia Plan Comments:         Anesthesia Quick Evaluation

## 2018-03-11 ENCOUNTER — Ambulatory Visit (HOSPITAL_COMMUNITY): Payer: Medicare PPO | Admitting: Anesthesiology

## 2018-03-11 ENCOUNTER — Encounter (HOSPITAL_COMMUNITY)
Admission: RE | Disposition: A | Discharge status: Home / Self Care | Payer: Self-pay | Source: Ambulatory Visit | Attending: Surgery

## 2018-03-11 ENCOUNTER — Other Ambulatory Visit: Payer: Self-pay

## 2018-03-11 ENCOUNTER — Observation Stay (HOSPITAL_COMMUNITY)
Admission: RE | Admit: 2018-03-11 | Discharge: 2018-03-13 | Disposition: A | Discharge status: Home / Self Care | Payer: Medicare PPO | Source: Ambulatory Visit | Attending: Surgery | Admitting: Surgery

## 2018-03-11 ENCOUNTER — Encounter (HOSPITAL_COMMUNITY): Payer: Self-pay

## 2018-03-11 DIAGNOSIS — Z79890 Hormone replacement therapy (postmenopausal): Secondary | ICD-10-CM | POA: Diagnosis not present

## 2018-03-11 DIAGNOSIS — Z87891 Personal history of nicotine dependence: Secondary | ICD-10-CM | POA: Diagnosis not present

## 2018-03-11 DIAGNOSIS — I10 Essential (primary) hypertension: Secondary | ICD-10-CM | POA: Insufficient documentation

## 2018-03-11 DIAGNOSIS — R222 Localized swelling, mass and lump, trunk: Secondary | ICD-10-CM | POA: Diagnosis not present

## 2018-03-11 DIAGNOSIS — C8339 Diffuse large B-cell lymphoma, extranodal and solid organ sites: Secondary | ICD-10-CM | POA: Diagnosis not present

## 2018-03-11 DIAGNOSIS — E890 Postprocedural hypothyroidism: Secondary | ICD-10-CM | POA: Diagnosis not present

## 2018-03-11 DIAGNOSIS — C8330 Diffuse large B-cell lymphoma, unspecified site: Principal | ICD-10-CM | POA: Insufficient documentation

## 2018-03-11 DIAGNOSIS — Z79899 Other long term (current) drug therapy: Secondary | ICD-10-CM | POA: Insufficient documentation

## 2018-03-11 DIAGNOSIS — Z85850 Personal history of malignant neoplasm of thyroid: Secondary | ICD-10-CM | POA: Diagnosis not present

## 2018-03-11 DIAGNOSIS — N5089 Other specified disorders of the male genital organs: Secondary | ICD-10-CM | POA: Diagnosis present

## 2018-03-11 DIAGNOSIS — C8335 Diffuse large B-cell lymphoma, lymph nodes of inguinal region and lower limb: Secondary | ICD-10-CM | POA: Diagnosis not present

## 2018-03-11 HISTORY — PX: MASS EXCISION: SHX2000

## 2018-03-11 SURGERY — EXCISION MASS
Anesthesia: General

## 2018-03-11 MED ORDER — LIDOCAINE 2% (20 MG/ML) 5 ML SYRINGE
INTRAMUSCULAR | Status: DC | PRN
  Administered 2018-03-11: 100 mg via INTRAVENOUS

## 2018-03-11 MED ORDER — ONDANSETRON 4 MG PO TBDP: 4.0000 mg | ORAL_TABLET | Freq: Four times a day (QID) | ORAL | Status: DC | PRN

## 2018-03-11 MED ORDER — 0.9 % SODIUM CHLORIDE (POUR BTL) OPTIME
TOPICAL | Status: DC | PRN
  Administered 2018-03-11: 1000 mL

## 2018-03-11 MED ORDER — LEVOTHYROXINE SODIUM 25 MCG PO TABS
125.0000 ug | ORAL_TABLET | Freq: Every day | ORAL | Status: DC
  Administered 2018-03-12: 125 ug via ORAL
  Filled 2018-03-11 (×2): qty 1

## 2018-03-11 MED ORDER — CHLORHEXIDINE GLUCONATE CLOTH 2 % EX PADS: 6.0000 | MEDICATED_PAD | Freq: Once | CUTANEOUS | Status: DC

## 2018-03-11 MED ORDER — BENAZEPRIL HCL 10 MG PO TABS
20.0000 mg | ORAL_TABLET | Freq: Every day | ORAL | Status: DC
  Administered 2018-03-11 – 2018-03-12 (×2): 20 mg via ORAL
  Filled 2018-03-11 (×2): qty 2

## 2018-03-11 MED ORDER — ACETAMINOPHEN 325 MG PO TABS
650.0000 mg | ORAL_TABLET | Freq: Four times a day (QID) | ORAL | Status: DC | PRN
  Administered 2018-03-11: 650 mg via ORAL
  Filled 2018-03-11: qty 2

## 2018-03-11 MED ORDER — HYDROMORPHONE HCL 1 MG/ML IJ SOLN: 0.2500 mg | INTRAMUSCULAR | Status: DC | PRN

## 2018-03-11 MED ORDER — DEXAMETHASONE SODIUM PHOSPHATE 10 MG/ML IJ SOLN
INTRAMUSCULAR | Status: DC | PRN
  Administered 2018-03-11: 10 mg via INTRAVENOUS

## 2018-03-11 MED ORDER — FENTANYL CITRATE (PF) 100 MCG/2ML IJ SOLN
INTRAMUSCULAR | Status: DC | PRN
  Administered 2018-03-11 (×2): 50 ug via INTRAVENOUS

## 2018-03-11 MED ORDER — TRAMADOL HCL 50 MG PO TABS: 50.0000 mg | ORAL_TABLET | Freq: Four times a day (QID) | ORAL | 0 refills | Status: DC | PRN

## 2018-03-11 MED ORDER — HYDROMORPHONE HCL 1 MG/ML IJ SOLN: 1.0000 mg | INTRAMUSCULAR | Status: DC | PRN

## 2018-03-11 MED ORDER — METRONIDAZOLE IN NACL 5-0.79 MG/ML-% IV SOLN
500.0000 mg | INTRAVENOUS | Status: AC
  Administered 2018-03-11: 500 mg via INTRAVENOUS
  Filled 2018-03-11: qty 100

## 2018-03-11 MED ORDER — HYDROCODONE-ACETAMINOPHEN 5-325 MG PO TABS: 1.0000 | ORAL_TABLET | ORAL | Status: DC | PRN

## 2018-03-11 MED ORDER — BUPIVACAINE-EPINEPHRINE 0.5% -1:200000 IJ SOLN
INTRAMUSCULAR | Status: DC | PRN
  Administered 2018-03-11: 20 mL

## 2018-03-11 MED ORDER — BUPIVACAINE-EPINEPHRINE (PF) 0.5% -1:200000 IJ SOLN
INTRAMUSCULAR | Status: AC
  Filled 2018-03-11: qty 30

## 2018-03-11 MED ORDER — ONDANSETRON HCL 4 MG/2ML IJ SOLN
INTRAMUSCULAR | Status: DC | PRN
  Administered 2018-03-11: 4 mg via INTRAVENOUS

## 2018-03-11 MED ORDER — KCL IN DEXTROSE-NACL 20-5-0.45 MEQ/L-%-% IV SOLN
INTRAVENOUS | Status: DC
  Administered 2018-03-11 – 2018-03-12 (×2): via INTRAVENOUS
  Filled 2018-03-11 (×4): qty 1000

## 2018-03-11 MED ORDER — CIPROFLOXACIN IN D5W 400 MG/200ML IV SOLN
400.0000 mg | INTRAVENOUS | Status: AC
  Administered 2018-03-11: 400 mg via INTRAVENOUS
  Filled 2018-03-11: qty 200

## 2018-03-11 MED ORDER — PROPOFOL 10 MG/ML IV BOLUS
INTRAVENOUS | Status: DC | PRN
  Administered 2018-03-11: 20 mg via INTRAVENOUS
  Administered 2018-03-11: 140 mg via INTRAVENOUS

## 2018-03-11 MED ORDER — DEXAMETHASONE SODIUM PHOSPHATE 10 MG/ML IJ SOLN
INTRAMUSCULAR | Status: AC
  Filled 2018-03-11: qty 1

## 2018-03-11 MED ORDER — FENTANYL CITRATE (PF) 100 MCG/2ML IJ SOLN
INTRAMUSCULAR | Status: AC
  Filled 2018-03-11: qty 2

## 2018-03-11 MED ORDER — PROMETHAZINE HCL 25 MG/ML IJ SOLN: 6.2500 mg | INTRAMUSCULAR | Status: DC | PRN

## 2018-03-11 MED ORDER — PROPOFOL 10 MG/ML IV BOLUS
INTRAVENOUS | Status: AC
  Filled 2018-03-11: qty 20

## 2018-03-11 MED ORDER — ONDANSETRON HCL 4 MG/2ML IJ SOLN: 4.0000 mg | Freq: Four times a day (QID) | INTRAMUSCULAR | Status: DC | PRN

## 2018-03-11 MED ORDER — ONDANSETRON HCL 4 MG/2ML IJ SOLN
INTRAMUSCULAR | Status: AC
  Filled 2018-03-11: qty 2

## 2018-03-11 MED ORDER — LIDOCAINE 2% (20 MG/ML) 5 ML SYRINGE
INTRAMUSCULAR | Status: AC
  Filled 2018-03-11: qty 5

## 2018-03-11 MED ORDER — LACTATED RINGERS IV SOLN
INTRAVENOUS | Status: DC
  Administered 2018-03-11: 09:00:00 via INTRAVENOUS

## 2018-03-11 MED ORDER — ACETAMINOPHEN 650 MG RE SUPP: 650.0000 mg | Freq: Four times a day (QID) | RECTAL | Status: DC | PRN

## 2018-03-11 MED ORDER — TRAMADOL HCL 50 MG PO TABS: 50.0000 mg | ORAL_TABLET | Freq: Four times a day (QID) | ORAL | Status: DC | PRN

## 2018-03-11 SURGICAL SUPPLY — 37 items
BLADE HEX COATED 2.75 (ELECTRODE) ×2 IMPLANT
BLADE SURG 15 STRL LF DISP TIS (BLADE) ×1 IMPLANT
BLADE SURG 15 STRL SS (BLADE) ×2
CHLORAPREP W/TINT 26ML (MISCELLANEOUS) ×1 IMPLANT
COVER SURGICAL LIGHT HANDLE (MISCELLANEOUS) ×2 IMPLANT
DECANTER SPIKE VIAL GLASS SM (MISCELLANEOUS) ×2 IMPLANT
DRAIN PENROSE 18X1/2 LTX STRL (DRAIN) IMPLANT
DRSG PAD ABDOMINAL 8X10 ST (GAUZE/BANDAGES/DRESSINGS) IMPLANT
ELECT PENCIL ROCKER SW 15FT (MISCELLANEOUS) ×2 IMPLANT
ELECT REM PT RETURN 15FT ADLT (MISCELLANEOUS) ×2 IMPLANT
GAUZE 4X4 16PLY RFD (DISPOSABLE) ×2 IMPLANT
GAUZE PACKING IODOFORM 1X5 (MISCELLANEOUS) ×1 IMPLANT
GAUZE SPONGE 4X4 12PLY STRL (GAUZE/BANDAGES/DRESSINGS) ×2 IMPLANT
GAUZE SPONGE 4X4 16PLY XRAY LF (GAUZE/BANDAGES/DRESSINGS) ×1 IMPLANT
GLOVE BIOGEL PI IND STRL 7.0 (GLOVE) ×1 IMPLANT
GLOVE BIOGEL PI INDICATOR 7.0 (GLOVE)
GOWN STRL REUS W/TWL LRG LVL3 (GOWN DISPOSABLE) ×2 IMPLANT
GOWN STRL REUS W/TWL XL LVL3 (GOWN DISPOSABLE) ×2 IMPLANT
KIT BASIN OR (CUSTOM PROCEDURE TRAY) ×2 IMPLANT
LUBRICANT JELLY K Y 4OZ (MISCELLANEOUS) IMPLANT
NDL HYPO 25X1 1.5 SAFETY (NEEDLE) ×1 IMPLANT
NDL SAFETY ECLIPSE 18X1.5 (NEEDLE) IMPLANT
NEEDLE HYPO 18GX1.5 SHARP (NEEDLE)
NEEDLE HYPO 25X1 1.5 SAFETY (NEEDLE) ×2 IMPLANT
NS IRRIG 1000ML POUR BTL (IV SOLUTION) ×1 IMPLANT
PACK LITHOTOMY IV (CUSTOM PROCEDURE TRAY) ×2 IMPLANT
PAD ABD 7.5X8 STRL (GAUZE/BANDAGES/DRESSINGS) ×2 IMPLANT
SOL PREP PROV IODINE SCRUB 4OZ (MISCELLANEOUS) ×1 IMPLANT
SPONGE SURGIFOAM ABS GEL 12-7 (HEMOSTASIS) IMPLANT
SUT CHROMIC 2 0 SH (SUTURE) IMPLANT
SUT CHROMIC 3 0 SH 27 (SUTURE) IMPLANT
SYR BULB IRRIGATION 50ML (SYRINGE) ×1 IMPLANT
SYR CONTROL 10ML LL (SYRINGE) ×2 IMPLANT
TAPE CLOTH SURG 4X10 WHT LF (GAUZE/BANDAGES/DRESSINGS) ×1 IMPLANT
TOWEL OR 17X26 10 PK STRL BLUE (TOWEL DISPOSABLE) ×2 IMPLANT
UNDERPAD 30X30 (UNDERPADS AND DIAPERS) ×2 IMPLANT
YANKAUER SUCT BULB TIP 10FT TU (MISCELLANEOUS) ×2 IMPLANT

## 2018-03-11 NOTE — Anesthesia Procedure Notes (Signed)
Procedure Name: LMA Insertion Date/Time: 03/11/2018 10:02 AM Performed by: Lind Covert, CRNA Pre-anesthesia Checklist: Patient identified, Emergency Drugs available, Suction available, Patient being monitored and Timeout performed Patient Re-evaluated:Patient Re-evaluated prior to induction Oxygen Delivery Method: Circle system utilized Preoxygenation: Pre-oxygenation with 100% oxygen Induction Type: IV induction LMA: LMA inserted LMA Size: 4.0 Tube type: Oral Number of attempts: 1 Placement Confirmation: positive ETCO2 and breath sounds checked- equal and bilateral Tube secured with: Tape Dental Injury: Teeth and Oropharynx as per pre-operative assessment

## 2018-03-11 NOTE — Anesthesia Postprocedure Evaluation (Signed)
Anesthesia Post Note  Patient: Jay Webster  Procedure(s) Performed: EXCISION MASS OF PERINEUM (N/A )     Patient location during evaluation: PACU Anesthesia Type: General Level of consciousness: awake and alert Pain management: pain level controlled Vital Signs Assessment: post-procedure vital signs reviewed and stable Respiratory status: spontaneous breathing, nonlabored ventilation, respiratory function stable and patient connected to nasal cannula oxygen Cardiovascular status: blood pressure returned to baseline and stable Postop Assessment: no apparent nausea or vomiting Anesthetic complications: no    Last Vitals:  Vitals:   03/11/18 1130 03/11/18 1145  BP: 121/70 128/70  Pulse: 70 70  Resp: 13 14  Temp: 37.1 C 36.7 C  SpO2: 100% 100%    Last Pain:  Vitals:   03/11/18 1130  TempSrc:   PainSc: 0-No pain                 Tiajuana Amass

## 2018-03-11 NOTE — Interval H&P Note (Signed)
History and Physical Interval Note:  03/11/2018 9:48 AM  Jay Webster  has presented today for surgery, with the diagnosis of SOFT TISSUE MASS PERINEUM.  The various methods of treatment have been discussed with the patient and family. After consideration of risks, benefits and other options for treatment, the patient has consented to    Procedure(s): EXCISION MASS OF PERINEUM (N/A) as a surgical intervention .    The patient's history has been reviewed, patient examined, no change in status, stable for surgery.  I have reviewed the patient's chart and labs.  Questions were answered to the patient's satisfaction.    Armandina Gemma, [place] Office: [place]

## 2018-03-11 NOTE — Transfer of Care (Signed)
Immediate Anesthesia Transfer of Care Note  Patient: Jay Webster  Procedure(s) Performed: EXCISION MASS OF PERINEUM (N/A )  Patient Location: PACU  Anesthesia Type:General  Level of Consciousness: awake, alert , oriented and drowsy  Airway & Oxygen Therapy: Patient Spontanous Breathing and Patient connected to face mask  Post-op Assessment: Report given to RN and Post -op Vital signs reviewed and stable  Post vital signs: Reviewed and stable  Last Vitals:  Vitals Value Taken Time  BP    Temp    Pulse 87 03/11/2018 10:51 AM  Resp 15 03/11/2018 10:51 AM  SpO2 100 % 03/11/2018 10:51 AM  Vitals shown include unvalidated device data.  Last Pain:  Vitals:   03/11/18 0823  TempSrc:   PainSc: 5       Patients Stated Pain Goal: 2 (63/87/56 4332)  Complications: No apparent anesthesia complications

## 2018-03-11 NOTE — Discharge Instructions (Signed)
°  [facility] -- DISCHARGE INSTRUCTIONS  REMINDER:   Carry a list of your medications and allergies with you at all times  Call your pharmacy at least 1 week in advance to refill prescriptions  Do not mix any prescribed pain medicine with alcohol  Do not drive any motor vehicles while taking pain medication  Take medications with food unless otherwise directed  Follow-up appointments (date to return to physician): Please call [phone] to confirm your follow up appointment with your surgeon.  Call your Surgeon if you have:  Temperature greater than 101.0  Persistent nausea and vomiting  Severe uncontrolled pain  Redness, tenderness, or signs of infection (pain, swelling, redness, odor or    green/yellow discharge around the site)  Difficulty breathing, headache or visual disturbances  Hives  Persistent dizziness or light-headedness  Any other questions or concerns you may have after discharge  In an emergency, call 911 or go to an Emergency Department at a nearby hospital.  Diet: Begin with liquids, and if they are tolerated, resume your usual diet.  Avoid spicy, greasy or heavy foods.  If you have nausea or vomiting, go back to liquids.  If you cannot keep liquids down, call your doctor.  Avoid alcohol consumption while on prescription pain medications. Good nutrition promotes healing. Increase fiber and fluids.   ADDITIONAL INSTRUCTIONS: Remove packing after two days and begin tub soaks twice daily.  Keep wound covered with gauze dressing.  May shower.  Earnstine Regal, MD, [facility] Office: [phone]

## 2018-03-11 NOTE — Op Note (Signed)
NAMEKymere, [place] [facility] MEDICAL RECORD NW:[id number] ACCOUNT [id number] DATE OF BIRTH:05-31-1932 FACILITY: [facility] LOCATION: [facility] PHYSICIAN:Kameran Mcneese Leeanne Mannan, MD  OPERATIVE REPORT  DATE OF PROCEDURE:  03/11/2018  PREOPERATIVE DIAGNOSIS:  Perineal mass, left medial buttock.  POSTOPERATIVE DIAGNOSIS:  Perineal mass, left medial buttock.  PROCEDURE:  Excision perineal mass (5 x 4 x 2 cm).  SURGEON:  Earnstine Regal, MD  ANESTHESIA:  General.  ESTIMATED BLOOD LOSS:  Minimal.  PREPARATION:  Betadine.  COMPLICATIONS:  None.  INDICATIONS:  The patient is an 84 year old male referred by his primary care physician for evaluation of a soft tissue mass on the perineum.  This had been present for some time and gradually increasing in size.  It had never shown signs of infection.   There had been no drainage.  He had had no prior surgical procedures.  The patient now comes to surgery for excision for definitive diagnosis and management.  DESCRIPTION OF PROCEDURE:  Procedure is done at OR #1 at the [facility].  The patient was brought to the operating room and placed in a supine position on the operating room table.  Following administration of general anesthesia, the  patient was placed in lithotomy and prepped and draped in the usual aseptic fashion.  After ascertaining that an adequate level of anesthesia had been achieved, the area in the medial left buttock is addressed.  It does appear to be somewhat fluctuant on  examination at the time of surgery.  An elliptical incision was made around the most superficial portion of the mass.  Dissection was carried in the subcutaneous tissues.  There did not appear to be an abscess.  There was no fluid drainage.  There was  soft, almost granulation appearing tissue present in the subcutaneous tissues.  Using the electrocautery for hemostasis, the entire area of the mass was excised.  It measured 5 x 4 x 2 cm and the entire specimen is  submitted to pathology.  Wound was  irrigated with warm saline.  Good hemostasis was achieved with the electrocautery.  Local anesthetic was infiltrated circumferentially.  Based on the breadth of the wound and the fact that the excised tissue had possible chronic infection, a decision was  made to pack the wound open with 1 inch iodoform gauze packing.  Wound was then covered with 4 x 4 gauze followed by an ABD pad.  The patient is awakened from anesthesia and brought to the recovery room.  The patient tolerated the procedure well.  Armandina Gemma, [place] Office: [phone]    GN/NUANCE  D:03/11/2018 T:03/11/2018 JOB:[id number]

## 2018-03-11 NOTE — Brief Op Note (Signed)
03/11/2018  10:42 AM  PATIENT:  Jay Webster  84 y.o. male  PRE-OPERATIVE DIAGNOSIS:  SOFT TISSUE MASS PERINEUM  POST-OPERATIVE DIAGNOSIS:  SOFT TISSUE MASS PERINEUM  PROCEDURE:  Procedure(s): EXCISION MASS OF PERINEUM (N/A)  SURGEON:  Surgeon(s) and Role:    Armandina Gemma, MD - Primary  ANESTHESIA:   general  EBL:  0 mL   BLOOD ADMINISTERED:none  DRAINS: none   LOCAL MEDICATIONS USED:  MARCAINE     SPECIMEN:  Excision  DISPOSITION OF SPECIMEN:  PATHOLOGY  COUNTS:  YES  TOURNIQUET:  * No tourniquets in log *  DICTATION: .Other Dictation: Dictation Number [id number]  PLAN OF CARE: Admit for overnight observation  PATIENT DISPOSITION:  PACU - hemodynamically stable.   Delay start of Pharmacological VTE agent (>24hrs) due to surgical blood loss or risk of bleeding: yes  Armandina Gemma, MD [facility] Office: [phone]

## 2018-03-12 ENCOUNTER — Encounter (HOSPITAL_COMMUNITY): Payer: Self-pay | Admitting: Surgery

## 2018-03-12 DIAGNOSIS — I10 Essential (primary) hypertension: Secondary | ICD-10-CM | POA: Diagnosis not present

## 2018-03-12 DIAGNOSIS — C8330 Diffuse large B-cell lymphoma, unspecified site: Secondary | ICD-10-CM | POA: Diagnosis not present

## 2018-03-12 DIAGNOSIS — Z79899 Other long term (current) drug therapy: Secondary | ICD-10-CM | POA: Diagnosis not present

## 2018-03-12 DIAGNOSIS — E890 Postprocedural hypothyroidism: Secondary | ICD-10-CM | POA: Diagnosis not present

## 2018-03-12 DIAGNOSIS — Z79890 Hormone replacement therapy (postmenopausal): Secondary | ICD-10-CM | POA: Diagnosis not present

## 2018-03-12 DIAGNOSIS — Z85850 Personal history of malignant neoplasm of thyroid: Secondary | ICD-10-CM | POA: Diagnosis not present

## 2018-03-12 DIAGNOSIS — Z87891 Personal history of nicotine dependence: Secondary | ICD-10-CM | POA: Diagnosis not present

## 2018-03-12 NOTE — Progress Notes (Signed)
RN demonstrated and educated patient of procedure to change the dressing to his surgical site. No questions or concerns at this time. Will continue to monitor.

## 2018-03-12 NOTE — Progress Notes (Signed)
Patient ID: Jay Webster, male   DOB: 20-Feb-1932, 84 y.o.   MRN: [id number] [facility] Progress Note:   1 Day Post-Op  Subjective: Mental status is clear but he did get confused during the night when he got up Objective: Vital signs in last 24 hours: Temp:  [97.5 F (36.4 C)-98.8 F (37.1 C)] 98.7 F (37.1 C) (07/06 0520) Pulse Rate:  [70-83] 80 (07/06 0520) Resp:  [10-20] 20 (07/06 0520) BP: (115-144)/(65-82) 124/68 (07/06 0520) SpO2:  [95 %-100 %] 96 % (07/06 0520)  Intake/Output from previous day: 07/05 0701 - 07/06 0700 In: 970 [I.V.:970] Out: 1050 [Urine:1050] Intake/Output this shift: No intake/output data recorded.  Physical Exam: Work of breathing is normal.  Packing removed and wound clean.  To begin teaching patient how to repack.    Lab Results:  No results found for this or any previous visit (from the past 48 hour(s)).  Radiology/Results: No results found.  Anti-infectives: Anti-infectives (From admission, onward)   Start     Dose/Rate Route Frequency Ordered Stop   03/11/18 0800  metroNIDAZOLE (FLAGYL) IVPB 500 mg     500 mg 100 mL/hr over 60 Minutes Intravenous On call to O.R. 03/11/18 0755 03/11/18 1003   03/11/18 0800  ciprofloxacin (CIPRO) IVPB 400 mg     400 mg 200 mL/hr over 60 Minutes Intravenous On call to O.R. 03/11/18 0755 03/11/18 1014      Assessment/Plan: Problem List: Patient Active Problem List   Diagnosis Date Noted  . Perineal mass, male 03/11/2018  . Perineal mass in male 03/08/2018  . Degeneration of lumbar intervertebral disc 10/05/2017  . History of total hip arthroplasty 10/05/2017  . Combined forms of age-related cataract of both eyes 04/12/2014  . OA (osteoarthritis) of hip 05/31/2013  . History of revision of total replacement of left hip joint 05/31/2013  . High risk medication use 10/25/2012  . Vitreomacular adhesion 05/24/2012  . Posterior vitreous detachment 04/12/2012  . Cystoid macular edema  11/10/2011  . Subretinal neovascularization of macula 09/11/2011  . Retinal pigment epithelial detachment, bilateral 09/11/2011  . Macular degeneration of both eyes 06/09/2011  . History of thyroid cancer 04/21/2011  . Macular degeneration 01/05/2011  . CARCINOMA, THYROID GLAND, PAPILLARY 11/20/2008  . HYPOTHYROIDISM, POSTSURGICAL 11/20/2008  . Osteoarthritis 07/01/2007  . LOW BACK PAIN 07/01/2007  . Uric acid renal calculus 06/20/2007  . Essential hypertension 06/20/2007    Lives alone and all friends are out of town.  Needs help today with teaching self care if possible and if not will need home health care arrangements.  Will reassess for discharge tomorrow.   1 Day Post-Op    LOS: 0 days   Matt B. Hassell Done, MD, [facility] [phone] beeper [phone]  03/12/2018 8:33 AM

## 2018-03-13 MED ORDER — HYDROCODONE-ACETAMINOPHEN 5-325 MG PO TABS: 1.0000 | ORAL_TABLET | ORAL | 0 refills | Status: DC | PRN

## 2018-03-13 NOTE — Discharge Summary (Signed)
Physician Discharge Summary  Patient ID: Jay Webster MRN: [id number] DOB/AGE: 12-07-1931 84 y.o.  PCP: Billie Ruddy, MD  Admit date: 03/11/2018 Discharge date: 03/13/2018  Admission Diagnoses:  Perineal mass  Discharge Diagnoses:  Same path pending  Principal Problem:   Perineal mass in male Active Problems:   Perineal mass, male   Surgery:  Excision and packing of perineal mass  Discharged Condition: improved  Hospital Course:   Had surgery on Friday;  Packing removed on Saturday and patient instructed on self packing.  Able to drive and go home by himself on Sunday.    Consults: none  Significant Diagnostic Studies: path pending    Discharge Exam: Blood pressure (!) 126/93, pulse 72, temperature 98.2 F (36.8 C), temperature source Oral, resp. rate 17, height 5\' 5"  (1.651 m), weight 80.7 kg (178 lb), SpO2 99 %. Open perineal wound is clean and not bleeding.  Disposition: Discharge disposition: 01-Home or Self Care       Discharge Instructions    Diet - low sodium heart healthy   Complete by:  As directed    Discharge wound care:   Complete by:  As directed    May shower daily;  Hair dryer on low can dry your scrotal area;  Repack and secure with mesh panty   Increase activity slowly   Complete by:  As directed      Allergies as of 03/13/2018      Reactions   Penicillins Swelling, Palpitations   Swelling of joints. Has patient had a PCN reaction causing immediate rash, facial/tongue/throat swelling, SOB or lightheadedness with hypotension: Yes Has patient had a PCN reaction causing severe rash involving mucus membranes or skin necrosis: No Has patient had a PCN reaction that required hospitalization: Yes Has patient had a PCN reaction occurring within the last 10 years: No If all of the above answers are "NO", then may proceed with Cephalosporin use.      Medication List    TAKE these medications   acetaminophen 325 MG tablet Commonly known as:   TYLENOL Take 325-650 mg by mouth daily as needed for headache.   allopurinol 300 MG tablet Commonly known as:  ZYLOPRIM TAKE 1 TABLET (300 MG TOTAL) BY MOUTH DAILY. What changed:  See the new instructions.   benazepril 20 MG tablet Commonly known as:  LOTENSIN TAKE 1 TABLET (20 MG TOTAL) BY MOUTH DAILY.   diphenhydrAMINE 25 MG tablet Commonly known as:  BENADRYL Take 25 mg by mouth daily as needed for allergies.   EYLEA 2 MG/0.05ML Soln Generic drug:  Aflibercept Inject 1 Dose into the eye every 8 (eight) weeks.   HYDROcodone-acetaminophen 5-325 MG tablet Commonly known as:  NORCO/VICODIN Take 1-2 tablets by mouth every 4 (four) hours as needed for moderate pain.   levothyroxine 125 MCG tablet Commonly known as:  SYNTHROID, LEVOTHROID Take 1 tablet (125 mcg total) by mouth daily.   multivitamin tablet Take 1 tablet by mouth daily.   neomycin-bacitracin-polymyxin ointment Commonly known as:  NEOSPORIN Apply 1 application topically as needed (irritation).   OSTEO BI-FLEX REGULAR STRENGTH PO Take 1 tablet by mouth 2 (two) times daily.   PRESERVISION AREDS 2 PO Take 1 tablet by mouth 2 (two) times daily.   sodium chloride 0.65 % Soln nasal spray Commonly known as:  OCEAN Place 1 spray into both nostrils as needed for congestion.   SYSTANE OP Place 1 drop into both eyes daily as needed (irritation).   traMADol 50  MG tablet Commonly known as:  ULTRAM Take 1-2 tablets (50-100 mg total) by mouth every 6 (six) hours as needed for moderate pain.            Discharge Care Instructions  (From admission, onward)        Start     Ordered   03/13/18 0000  Discharge wound care:    Comments:  May shower daily;  Hair dryer on low can dry your scrotal area;  Repack and secure with mesh panty   03/13/18 0742     Follow-up Information    Armandina Gemma, MD. Schedule an appointment as soon as possible for a visit in 2 weeks.   Specialty:  General Surgery Why:  For wound  re-check Contact information: [address] [place] [address] [phone]           Signed: Pedro Earls 03/13/2018, 7:45 AM

## 2018-03-22 ENCOUNTER — Telehealth: Payer: Self-pay | Admitting: Hematology

## 2018-03-22 NOTE — Progress Notes (Signed)
HEMATOLOGY/ONCOLOGY CONSULTATION NOTE  Date of Service: 03/23/2018  Patient Care Team: Billie Ruddy, MD as PCP - General (Family Medicine)  CHIEF COMPLAINTS/PURPOSE OF CONSULTATION:  Diffuse Large B-Cell Lymphoma  HISTORY OF PRESENTING ILLNESS:   Jay Webster is a wonderful 84 y.o. male who has been referred to [place] by surgeon Dr. Armandina Gemma for evaluation and management of Diffuse Large B-Cell Lymphoma. The pt reports that he is doing well overall.   Of note prior to the patient's visit today, pt has had Perineum biopsy completed on 03/11/18 with results revealing Diffuse Large B-Cell Lymphoma. The pt had surgery to remove his irregularly shaped mass in the left anterior portion of the perineum with Dr. Armandina Gemma.  The pt reports first noticing the mass in his left buttock about 2 months ago, after a visit with Dr. Trena Platt. The pt decided to consult his surgeon Dr. Armandina Gemma whom he previously had a thyroidectomy with. The pt notes that the spot became as large as a golf ball and did not breach the skin. The pt notes that it wasn't terribly uncomfortable to sit on. He denies feeling any differently recently than in the past  6 months to a year.   The pt notes that he has been having some continued discharge and is using Depends. He is changing his gauze twice each day.   He denies any recent medical problems. He continues to function independently at home and lives alone, as his partner of 30+ years died in the past year. He notes that he does not have any family members or friends who he would want to include in his care. He denies any difficulties functioning day to day and denies concerns for his memory.   He has been on thyroid replacement for the last 10 years following a thyroidectomy after thyroid cancer, and was treated with one dose of radioactive iodine.   The pt also notes a uric acid kidney stone history which hasn't been a problem in many years.    Most  recent lab results (02/28/18) of CBC  is as follows: all values are WNL except for HCT at 38.3.  On review of systems, pt reports left buttock surgical wound, ganglion cyst on right wrist, and denies fevers, chills, night sweats, unexpected weight loss, new fatigue, pain along the spine, leg swelling, noticing any new lumps or bumps, and any other symptoms.   On PMHx the pt denies heart problems, strokes, abdominal problems, lung problems.  On Social Hx the pt reports that he quit smoking in 1990, and used to smoke 1-2 packs of cigarettes each day before this. He denies excess alcohol being a problem, and consumes one glass of wine rarely. 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  On Family Hx the pt reports brother died of sarcoma, different brother with Type I DM, and denies other cancer or blood disorders.   MEDICAL HISTORY:  Past Medical History:  Diagnosis Date  . Arthritis    OA AND PAIN LEFT HIP AND OTHER JOINT PAINS  . Cancer (HCC)    THYROID CANCER - HAD THYROID REMOVED  . History of kidney stones   . History of shingles    RESOLVED  . Hypertension   . Hypothyroidism   . Low back pain   . Macular degeneration of both eyes   . RBBB (right bundle branch block with left anterior fascicular block)   . Seasonal  allergies   . Thyroid disease   . Uric acid renal calculus 06/20/2007   Qualifier: Diagnosis of  By: Rogue Bussing CMA, Maryann Alar      SURGICAL HISTORY: Past Surgical History:  Procedure Laterality Date  . APPENDECTOMY  1948  . COLONOSCOPY    . Elbow Radical Reduction  1973  . [place]  . MASS EXCISION N/A 03/11/2018   Procedure: EXCISION MASS OF PERINEUM;  Surgeon: Armandina Gemma, MD;  Location: [facility];  Service: General;  Laterality: N/A;  . REMOVAL OF FIRST RIB   NOV 1994   FOR COMPRESSED VEIN BETWEEN 1 ST RIB AND COLLARBONE  . Rib removed, 1st  1994  . ROTATOR CUFF REPAIR  2009  . THYROIDECTOMY  2010  .  TOTAL HIP ARTHROPLASTY Left 05/31/2013   Procedure: LEFT TOTAL HIP ARTHROPLASTY ANTERIOR APPROACH;  Surgeon: Gearlean Alf, MD;  Location: [facility];  Service: Orthopedics;  Laterality: Left;    SOCIAL HISTORY: Social History   Socioeconomic History  . Marital status: Widowed    Spouse name: Not on file  . Number of children: Not on file  . Years of education: Not on file  . Highest education level: Not on file  Occupational History  . Occupation: retired  Scientific laboratory technician  . Financial resource strain: Not on file  . Food insecurity:    Worry: Not on file    Inability: Not on file  . Transportation needs:    Medical: Not on file    Non-medical: Not on file  Tobacco Use  . Smoking status: Former Smoker    Types: Cigarettes    Last attempt to quit: 02/22/1979    Years since quitting: 39.1  . Smokeless tobacco: Never Used  Substance and Sexual Activity  . Alcohol use: Yes    Alcohol/week: 0.0 oz  . Drug use: No  . Sexual activity: Yes  Lifestyle  . Physical activity:    Days per week: Not on file    Minutes per session: Not on file  . Stress: Not on file  Relationships  . Social connections:    Talks on phone: Not on file    Gets together: Not on file    Attends religious service: Not on file    Active member of club or organization: Not on file    Attends meetings of clubs or organizations: Not on file    Relationship status: Not on file  . Intimate partner violence:    Fear of current or ex partner: Not on file    Emotionally abused: Not on file    Physically abused: Not on file    Forced sexual activity: Not on file  Other Topics Concern  . Not on file  Social History Narrative  . Not on file    FAMILY HISTORY: Family History  Problem Relation Age of Onset  . Heart disease Mother   . Diabetes Mother   . Anemia Unknown   . Thyroid disease Unknown     ALLERGIES:  is allergic to penicillins.  MEDICATIONS:  Current Outpatient Medications  Medication Sig  Dispense Refill  . acetaminophen (TYLENOL) 325 MG tablet Take 325-650 mg by mouth daily as needed for headache.    . Aflibercept (EYLEA) 2 MG/0.05ML SOLN Inject 1 Dose into the eye every 8 (eight) weeks.     Marland Kitchen allopurinol (ZYLOPRIM) 300 MG tablet TAKE 1 TABLET (300 MG TOTAL) BY MOUTH DAILY. (Patient taking differently: TAKE 1 TABLET (300 MG  TOTAL) BY MOUTH DAILY IN THE EVENING) 90 tablet 1  . benazepril (LOTENSIN) 20 MG tablet TAKE 1 TABLET (20 MG TOTAL) BY MOUTH DAILY. 90 tablet 1  . diphenhydrAMINE (BENADRYL) 25 MG tablet Take 25 mg by mouth daily as needed for allergies.    . Glucosamine-Chondroitin (OSTEO BI-FLEX REGULAR STRENGTH PO) Take 1 tablet by mouth 2 (two) times daily.    Marland Kitchen HYDROcodone-acetaminophen (NORCO/VICODIN) 5-325 MG tablet Take 1-2 tablets by mouth every 4 (four) hours as needed for moderate pain. 30 tablet 0  . levothyroxine (SYNTHROID, LEVOTHROID) 125 MCG tablet Take 1 tablet (125 mcg total) by mouth daily. 30 tablet 0  . Multiple Vitamin (MULTIVITAMIN) tablet Take 1 tablet by mouth daily.    . Multiple Vitamins-Minerals (PRESERVISION AREDS 2 PO) Take 1 tablet by mouth 2 (two) times daily.    Marland Kitchen neomycin-bacitracin-polymyxin (NEOSPORIN) ointment Apply 1 application topically as needed (irritation).    Vladimir Faster Glycol-Propyl Glycol (SYSTANE OP) Place 1 drop into both eyes daily as needed (irritation).    . sodium chloride (OCEAN) 0.65 % SOLN nasal spray Place 1 spray into both nostrils as needed for congestion.    . traMADol (ULTRAM) 50 MG tablet Take 1-2 tablets (50-100 mg total) by mouth every 6 (six) hours as needed for moderate pain. 20 tablet 0   No current facility-administered medications for this visit.     REVIEW OF SYSTEMS:    10 Point review of Systems was done is negative except as noted above.  PHYSICAL EXAMINATION: ECOG PERFORMANCE STATUS: 2 - Symptomatic, <50% confined to bed  . Vitals:   03/23/18 1501  BP: 133/71  Pulse: 83  Resp: 18  Temp: 97.6  F (36.4 C)  SpO2: 99%   Filed Weights   03/23/18 1501  Weight: 178 lb 8 oz (81 kg)   .Body mass index is 29.7 kg/m.  GENERAL:alert, in no acute distress and comfortable SKIN: no acute rashes, no significant lesions EYES: conjunctiva are pink and non-injected, sclera anicteric OROPHARYNX: MMM, no exudates, no oropharyngeal erythema or ulceration NECK: supple, no JVD LYMPH:  no palpable lymphadenopathy in the cervical, axillary or inguinal regions LUNGS: clear to auscultation b/l with normal respiratory effort HEART: regular rate & rhythm ABDOMEN:  normoactive bowel sounds , non tender, not distended. Left medial lower gluteal ulcer packed with dressing. Extremity: no pedal edema PSYCH: alert & oriented x 3 with fluent speech NEURO: no focal motor/sensory deficits  LABORATORY DATA:  I have reviewed the data as listed  . CBC Latest Ref Rng & Units 03/24/2018 02/28/2018 01/17/2018  WBC 4.0 - 10.3 K/uL 6.1 5.3 5.3  Hemoglobin 13.0 - 17.1 g/dL 12.6(L) 13.0 12.7(L)  Hematocrit 38.4 - 49.9 % 37.0(L) 38.3(L) 36.7(L)  Platelets 140 - 400 K/uL 252 230 262.0   . CBC    Component Value Date/Time   WBC 6.1 03/24/2018 0901   RBC 4.19 (L) 03/24/2018 0901   HGB 12.6 (L) 03/24/2018 0901   HCT 37.0 (L) 03/24/2018 0901   PLT 252 03/24/2018 0901   MCV 88.2 03/24/2018 0901   MCH 30.0 03/24/2018 0901   MCHC 34.0 03/24/2018 0901   RDW 14.0 03/24/2018 0901   LYMPHSABS 1.3 03/24/2018 0901   MONOABS 0.5 03/24/2018 0901   EOSABS 0.1 03/24/2018 0901   BASOSABS 0.1 03/24/2018 0901    CMP Latest Ref Rng & Units 03/24/2018 02/28/2018 01/17/2018  Glucose 70 - 99 mg/dL 98 87 93  BUN 8 - 23 mg/dL 14 17 14   Creatinine  0.61 - 1.24 mg/dL 0.82 0.74 0.67  Sodium 135 - 145 mmol/L 137 141 138  Potassium 3.5 - 5.1 mmol/L 4.3 4.7 4.1  Chloride 98 - 111 mmol/L 106 107 104  CO2 22 - 32 mmol/L 26 28 28   Calcium 8.9 - 10.3 mg/dL 9.1 9.1 8.8  Total Protein 6.5 - 8.1 g/dL 7.0 - 6.8  Total Bilirubin 0.3 - 1.2  mg/dL 0.3 - 0.5  Alkaline Phos 38 - 126 U/L 89 - 74  AST 15 - 41 U/L 12(L) - 14  ALT 0 - 44 U/L 14 - 14   . Lab Results  Component Value Date   LDH 185 03/24/2018   Component     Latest Ref Rng & Units 03/24/2018  LDH     98 - 192 U/L 185  HCV Ab     0.0 - 0.9 s/co ratio 0.2  Hep B Core Ab, Tot     Negative Negative  Hepatitis B Surface Ag     Negative Negative  HIV Screen 4th Generation wRfx     Non Reactive Non Reactive    03/11/18 Perineum Bx:    RADIOGRAPHIC STUDIES: I have personally reviewed the radiological images as listed and agreed with the findings in the report. No results found.  ASSESSMENT & PLAN:  84 y.o. male with  1. Diffuse Large B-Cell Lymphoma -newly diagnosed presenting as a mass in the left medial gluteal region/perineum s/p resection. PLAN -Discussed patient's most recent labs from 02/28/18, WBC normal at 5.3k, HGB normal at 13.0, PLT normal at 230k -Discussed the 03/11/18 Surgical bx which revealed Diffuse Large B-Cell Non-Hodgkin's Lymphoma -discussed meaning of the diagnosis, natural history of DLBCL, staging and potential treatment options based on staging. -No constitutional symptoms -R-CHOP if ECHO is not prohibitive  -Will order blood tests today - reviewed  -Will order PET/CT for initial staigng of newly diagonsed DLBCL -Will order ECHO for pre-chemo evaluation -Will set pt up for chemotherapy teaching -Provided supplemental information regarding his diagnosis -Will see pt back in 2 weeks with results   2.  Patient Active Problem List   Diagnosis Date Noted  . Perineal mass, male 03/11/2018  . Perineal mass in male 03/08/2018  . Degeneration of lumbar intervertebral disc 10/05/2017  . History of total hip arthroplasty 10/05/2017  . Combined forms of age-related cataract of both eyes 04/12/2014  . OA (osteoarthritis) of hip 05/31/2013  . History of revision of total replacement of left hip joint 05/31/2013  . High risk medication use  10/25/2012  . Vitreomacular adhesion 05/24/2012  . Posterior vitreous detachment 04/12/2012  . Cystoid macular edema 11/10/2011  . Subretinal neovascularization of macula 09/11/2011  . Retinal pigment epithelial detachment, bilateral 09/11/2011  . Macular degeneration of both eyes 06/09/2011  . History of thyroid cancer 04/21/2011  . Macular degeneration 01/05/2011  . CARCINOMA, THYROID GLAND, PAPILLARY 11/20/2008  . HYPOTHYROIDISM, POSTSURGICAL 11/20/2008  . Osteoarthritis 07/01/2007  . LOW BACK PAIN 07/01/2007  . Uric acid renal calculus 06/20/2007  . Essential hypertension 06/20/2007   -continue f/u with PCP  Labs today/tommorrow -PET/CT in 1 week -ECHO in 1 week RTC with Dr Irene Limbo in 2 weeks    All of the patients questions were answered with apparent satisfaction. The patient knows to call the clinic with any problems, questions or concerns.  The total time spent in the appt was 60  minutes and more than 50% was on counseling and direct patient cares.     Sullivan Lone  MD MS AAHIVMS [facility] Hematology/Oncology Physician [place]  (Office):       [phone] (Work cell):  [phone] (Fax):           [phone]  03/23/2018 4:02 PM  I, Baldwin [place], am acting as a Education administrator for Dr Irene Limbo.   .I have reviewed the above documentation for accuracy and completeness, and I agree with the above. Brunetta Genera MD

## 2018-03-22 NOTE — Telephone Encounter (Signed)
New referral received from Dr. Harlow Asa at [place]. Pt has been scheduled to see Dr. Irene Limbo on 7/17 at 3pm. Pt aware to arrive 30 minutes early.

## 2018-03-23 ENCOUNTER — Inpatient Hospital Stay: Payer: Medicare PPO | Attending: Hematology | Admitting: Hematology

## 2018-03-23 ENCOUNTER — Telehealth: Payer: Self-pay

## 2018-03-23 ENCOUNTER — Ambulatory Visit: Payer: Medicare PPO

## 2018-03-23 VITALS — BP 133/71 | HR 83 | Temp 97.6°F | Resp 18 | Ht 65.0 in | Wt 178.5 lb

## 2018-03-23 DIAGNOSIS — Z9889 Other specified postprocedural states: Secondary | ICD-10-CM | POA: Insufficient documentation

## 2018-03-23 DIAGNOSIS — E890 Postprocedural hypothyroidism: Secondary | ICD-10-CM | POA: Diagnosis not present

## 2018-03-23 DIAGNOSIS — Z85850 Personal history of malignant neoplasm of thyroid: Secondary | ICD-10-CM | POA: Insufficient documentation

## 2018-03-23 DIAGNOSIS — Z79899 Other long term (current) drug therapy: Secondary | ICD-10-CM | POA: Diagnosis not present

## 2018-03-23 DIAGNOSIS — Z8619 Personal history of other infectious and parasitic diseases: Secondary | ICD-10-CM | POA: Insufficient documentation

## 2018-03-23 DIAGNOSIS — M67431 Ganglion, right wrist: Secondary | ICD-10-CM

## 2018-03-23 DIAGNOSIS — Z87891 Personal history of nicotine dependence: Secondary | ICD-10-CM | POA: Insufficient documentation

## 2018-03-23 DIAGNOSIS — R630 Anorexia: Secondary | ICD-10-CM | POA: Diagnosis not present

## 2018-03-23 DIAGNOSIS — M1990 Unspecified osteoarthritis, unspecified site: Secondary | ICD-10-CM

## 2018-03-23 DIAGNOSIS — H3530 Unspecified macular degeneration: Secondary | ICD-10-CM | POA: Diagnosis not present

## 2018-03-23 DIAGNOSIS — C8330 Diffuse large B-cell lymphoma, unspecified site: Secondary | ICD-10-CM | POA: Insufficient documentation

## 2018-03-23 DIAGNOSIS — I10 Essential (primary) hypertension: Secondary | ICD-10-CM | POA: Diagnosis not present

## 2018-03-23 DIAGNOSIS — Z87442 Personal history of urinary calculi: Secondary | ICD-10-CM | POA: Insufficient documentation

## 2018-03-23 NOTE — Telephone Encounter (Signed)
Printed avs and calender of upcoming appointment. Per 7/17 los was unable to get labs today due to time, but patient will return in the morning for scheduled labs.

## 2018-03-24 ENCOUNTER — Inpatient Hospital Stay: Payer: Medicare PPO

## 2018-03-24 DIAGNOSIS — C8330 Diffuse large B-cell lymphoma, unspecified site: Secondary | ICD-10-CM | POA: Diagnosis not present

## 2018-03-24 DIAGNOSIS — M1990 Unspecified osteoarthritis, unspecified site: Secondary | ICD-10-CM | POA: Diagnosis not present

## 2018-03-24 DIAGNOSIS — M67431 Ganglion, right wrist: Secondary | ICD-10-CM | POA: Diagnosis not present

## 2018-03-24 DIAGNOSIS — Z9889 Other specified postprocedural states: Secondary | ICD-10-CM | POA: Diagnosis not present

## 2018-03-24 DIAGNOSIS — I10 Essential (primary) hypertension: Secondary | ICD-10-CM | POA: Diagnosis not present

## 2018-03-24 DIAGNOSIS — H3530 Unspecified macular degeneration: Secondary | ICD-10-CM | POA: Diagnosis not present

## 2018-03-24 DIAGNOSIS — E890 Postprocedural hypothyroidism: Secondary | ICD-10-CM | POA: Diagnosis not present

## 2018-03-24 DIAGNOSIS — Z79899 Other long term (current) drug therapy: Secondary | ICD-10-CM | POA: Diagnosis not present

## 2018-03-24 DIAGNOSIS — R630 Anorexia: Secondary | ICD-10-CM | POA: Diagnosis not present

## 2018-03-24 LAB — CBC WITH DIFFERENTIAL/PLATELET
BASOS PCT: 1 %
Basophils Absolute: 0.1 10*3/uL (ref 0.0–0.1)
Eosinophils Absolute: 0.1 10*3/uL (ref 0.0–0.5)
Eosinophils Relative: 1 %
HEMATOCRIT: 37 % — AB (ref 38.4–49.9)
HEMOGLOBIN: 12.6 g/dL — AB (ref 13.0–17.1)
LYMPHS ABS: 1.3 10*3/uL (ref 0.9–3.3)
Lymphocytes Relative: 21 %
MCH: 30 pg (ref 27.2–33.4)
MCHC: 34 g/dL (ref 32.0–36.0)
MCV: 88.2 fL (ref 79.3–98.0)
MONO ABS: 0.5 10*3/uL (ref 0.1–0.9)
Monocytes Relative: 8 %
NEUTROS ABS: 4.1 10*3/uL (ref 1.5–6.5)
Neutrophils Relative %: 69 %
Platelets: 252 10*3/uL (ref 140–400)
RBC: 4.19 MIL/uL — ABNORMAL LOW (ref 4.20–5.82)
RDW: 14 % (ref 11.0–14.6)
WBC: 6.1 10*3/uL (ref 4.0–10.3)

## 2018-03-24 LAB — LACTATE DEHYDROGENASE: LDH: 185 U/L (ref 98–192)

## 2018-03-24 LAB — CMP (CANCER CENTER ONLY)
ALBUMIN: 3.4 g/dL — AB (ref 3.5–5.0)
ALK PHOS: 89 U/L (ref 38–126)
ALT: 14 U/L (ref 0–44)
ANION GAP: 5 (ref 5–15)
AST: 12 U/L — ABNORMAL LOW (ref 15–41)
BILIRUBIN TOTAL: 0.3 mg/dL (ref 0.3–1.2)
BUN: 14 mg/dL (ref 8–23)
CO2: 26 mmol/L (ref 22–32)
Calcium: 9.1 mg/dL (ref 8.9–10.3)
Chloride: 106 mmol/L (ref 98–111)
Creatinine: 0.82 mg/dL (ref 0.61–1.24)
Glucose, Bld: 98 mg/dL (ref 70–99)
POTASSIUM: 4.3 mmol/L (ref 3.5–5.1)
Sodium: 137 mmol/L (ref 135–145)
Total Protein: 7 g/dL (ref 6.5–8.1)

## 2018-03-25 LAB — HIV ANTIBODY (ROUTINE TESTING W REFLEX): HIV SCREEN 4TH GENERATION: NONREACTIVE

## 2018-03-25 LAB — HEPATITIS B CORE ANTIBODY, TOTAL: Hep B Core Total Ab: NEGATIVE

## 2018-03-25 LAB — HEPATITIS C ANTIBODY: HCV AB: 0.2 {s_co_ratio} (ref 0.0–0.9)

## 2018-03-25 LAB — HEPATITIS B SURFACE ANTIGEN: HEP B S AG: NEGATIVE

## 2018-03-29 ENCOUNTER — Ambulatory Visit: Payer: Medicare PPO | Admitting: Family Medicine

## 2018-03-29 ENCOUNTER — Encounter: Payer: Self-pay | Admitting: Family Medicine

## 2018-03-29 ENCOUNTER — Other Ambulatory Visit: Payer: Self-pay

## 2018-03-29 VITALS — BP 118/70 | HR 70 | Temp 98.2°F | Ht 65.0 in | Wt 177.0 lb

## 2018-03-29 DIAGNOSIS — I10 Essential (primary) hypertension: Secondary | ICD-10-CM | POA: Diagnosis not present

## 2018-03-29 DIAGNOSIS — C8330 Diffuse large B-cell lymphoma, unspecified site: Secondary | ICD-10-CM | POA: Diagnosis not present

## 2018-03-29 MED ORDER — BENAZEPRIL HCL 20 MG PO TABS: ORAL_TABLET | ORAL | 1 refills | Status: DC

## 2018-03-29 MED ORDER — ALLOPURINOL 300 MG PO TABS: ORAL_TABLET | ORAL | 1 refills | Status: DC

## 2018-03-30 ENCOUNTER — Encounter: Payer: Self-pay | Admitting: Family Medicine

## 2018-03-30 DIAGNOSIS — C8330 Diffuse large B-cell lymphoma, unspecified site: Secondary | ICD-10-CM | POA: Insufficient documentation

## 2018-03-30 MED ORDER — BENAZEPRIL HCL 20 MG PO TABS: ORAL_TABLET | ORAL | 2 refills | Status: DC

## 2018-03-30 MED ORDER — ALLOPURINOL 300 MG PO TABS: ORAL_TABLET | ORAL | 1 refills | Status: DC
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
## 2018-04-05 NOTE — Progress Notes (Signed)
HEMATOLOGY/ONCOLOGY CONSULTATION NOTE  Date of Service: 04/06/2018  Patient Care Team: Billie Ruddy, MD as PCP - General (Family Medicine)  CHIEF COMPLAINTS/PURPOSE OF CONSULTATION:  Diffuse Large B-Cell Lymphoma  HISTORY OF PRESENTING ILLNESS:   Jay Webster is a wonderful 84 y.o. male who has been referred to [place] by surgeon Dr. Armandina Gemma for evaluation and management of Diffuse Large B-Cell Lymphoma. The pt reports that he is doing well overall.   Of note prior to the patient's visit today, pt has had Perineum biopsy completed on 03/11/18 with results revealing Diffuse Large B-Cell Lymphoma. The pt had surgery to remove his irregularly shaped mass in the left anterior portion of the perineum with Dr. Armandina Gemma.  The pt reports first noticing the mass in his left buttock about 2 months ago, after a visit with Dr. Trena Platt. The pt decided to consult his surgeon Dr. Armandina Gemma whom he previously had a thyroidectomy with. The pt notes that the spot became as large as a golf ball and did not breach the skin. The pt notes that it wasn't terribly uncomfortable to sit on. He denies feeling any differently recently than in the past  6 months to a year.   The pt notes that he has been having some continued discharge and is using Depends. He is changing his gauze twice each day.   He denies any recent medical problems. He continues to function independently at home and lives alone, as his partner of 30+ years died in the past year. He notes that he does not have any family members or friends who he would want to include in his care. He denies any difficulties functioning day to day and denies concerns for his memory.   He has been on thyroid replacement for the last 10 years following a thyroidectomy after thyroid cancer, and was treated with one dose of radioactive iodine.   The pt also notes a uric acid kidney stone history which hasn't been a problem in many years.    Most  recent lab results (02/28/18) of CBC  is as follows: all values are WNL except for HCT at 38.3.  On review of systems, pt reports left buttock surgical wound, ganglion cyst on right wrist, and denies fevers, chills, night sweats, unexpected weight loss, new fatigue, pain along the spine, leg swelling, noticing any new lumps or bumps, and any other symptoms.   On PMHx the pt denies heart problems, strokes, abdominal problems, lung problems.  On Social Hx the pt reports that he quit smoking in 1990, and used to smoke 1-2 packs of cigarettes each day before this. He denies excess alcohol being a problem, and consumes one glass of wine rarely. 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  On Family Hx the pt reports brother died of sarcoma, different brother with Type I DM, and denies other cancer or blood disorders.  Interval History:   Jay Webster returns today regarding his recently diagnosed Diffuse Large B-Cell Lymphoma. The patient's last visit with [place] was on 03/23/18. He is accompanied today by his friend. The pt reports that he is doing well overall.   The pt reports that his surgical biopsy wound has been healing well and is feeling more comfortable. He notes clear and red discharge but denies pus. He will see Dr. Armandina Gemma on 04/12/18 for follow up on his healing surgical incision.   The pt notes  that while in high school, 60-70 years ago, he had Hepatitis A that was at that time described to him as "infectious, not serum".   The pt notes that he continues to stay active though his appetite is mildly weaker but denies unexpected weight loss.   The pt notes that he is planning to move to a retirement/assisted living community soon.   Lab results (03/24/18) of CBC w/diff, CMP is as follows: all values are WNL except for RBC at 4.19, HGB at 12.6, HCT at 37.0, Albumin at 3.4, AST at 12.  LDH 03/24/18 is WNL at 185  PET/CT scheduled for 04/18/2018  On  review of systems, pt reports healing surgical biopsy site with red and clear discharge, weak appetite, staying active, and denies pus discharge, fevers, chills, night sweats, unexpected weight loss, leg swelling, and any other symptoms.    MEDICAL HISTORY:  Past Medical History:  Diagnosis Date  . Arthritis    OA AND PAIN LEFT HIP AND OTHER JOINT PAINS  . Cancer (HCC)    THYROID CANCER - HAD THYROID REMOVED  . History of kidney stones   . History of shingles    RESOLVED  . Hypertension   . Hypothyroidism   . Low back pain   . Macular degeneration of both eyes   . RBBB (right bundle branch block with left anterior fascicular block)   . Seasonal allergies   . Thyroid disease   . Uric acid renal calculus 06/20/2007   Qualifier: Diagnosis of  By: Rogue Bussing CMA, Maryann Alar      SURGICAL HISTORY: Past Surgical History:  Procedure Laterality Date  . APPENDECTOMY  1948  . COLONOSCOPY    . Elbow Radical Reduction  1973  . [place]  . MASS EXCISION N/A 03/11/2018   Procedure: EXCISION MASS OF PERINEUM;  Surgeon: Armandina Gemma, MD;  Location: [facility];  Service: General;  Laterality: N/A;  . REMOVAL OF FIRST RIB   NOV 1994   FOR COMPRESSED VEIN BETWEEN 1 ST RIB AND COLLARBONE  . Rib removed, 1st  1994  . ROTATOR CUFF REPAIR  2009  . THYROIDECTOMY  2010  . TOTAL HIP ARTHROPLASTY Left 05/31/2013   Procedure: LEFT TOTAL HIP ARTHROPLASTY ANTERIOR APPROACH;  Surgeon: Gearlean Alf, MD;  Location: [facility];  Service: Orthopedics;  Laterality: Left;    SOCIAL HISTORY: Social History   Socioeconomic History  . Marital status: Widowed    Spouse name: Not on file  . Number of children: Not on file  . Years of education: Not on file  . Highest education level: Not on file  Occupational History  . Occupation: retired  Scientific laboratory technician  . Financial resource strain: Not on file  . Food insecurity:    Worry: Not on file    Inability: Not on file  . Transportation needs:      Medical: Not on file    Non-medical: Not on file  Tobacco Use  . Smoking status: Former Smoker    Types: Cigarettes    Last attempt to quit: 02/22/1979    Years since quitting: 39.1  . Smokeless tobacco: Never Used  Substance and Sexual Activity  . Alcohol use: Yes    Alcohol/week: 0.0 oz  . Drug use: No  . Sexual activity: Yes  Lifestyle  . Physical activity:    Days per week: Not on file    Minutes per session: Not on file  . Stress: Not on file  Relationships  .  Social connections:    Talks on phone: Not on file    Gets together: Not on file    Attends religious service: Not on file    Active member of club or organization: Not on file    Attends meetings of clubs or organizations: Not on file    Relationship status: Not on file  . Intimate partner violence:    Fear of current or ex partner: Not on file    Emotionally abused: Not on file    Physically abused: Not on file    Forced sexual activity: Not on file  Other Topics Concern  . Not on file  Social History Narrative  . Not on file    FAMILY HISTORY: Family History  Problem Relation Age of Onset  . Heart disease Mother   . Diabetes Mother   . Anemia Unknown   . Thyroid disease Unknown     ALLERGIES:  is allergic to penicillins.  MEDICATIONS:  Current Outpatient Medications  Medication Sig Dispense Refill  . acetaminophen (TYLENOL) 325 MG tablet Take 325-650 mg by mouth daily as needed for headache.    . Aflibercept (EYLEA) 2 MG/0.05ML SOLN Inject 1 Dose into the eye every 8 (eight) weeks.     Marland Kitchen allopurinol (ZYLOPRIM) 300 MG tablet TAKE 1 TABLET (300 MG TOTAL) BY MOUTH DAILY IN THE EVENING 90 tablet 1  . benazepril (LOTENSIN) 20 MG tablet TAKE 1 TABLET (20 MG TOTAL) BY MOUTH DAILY. 90 tablet 2  . diphenhydrAMINE (BENADRYL) 25 MG tablet Take 25 mg by mouth daily as needed for allergies.    . Glucosamine-Chondroitin (OSTEO BI-FLEX REGULAR STRENGTH PO) Take 1 tablet by mouth 2 (two) times daily.    Marland Kitchen  HYDROcodone-acetaminophen (NORCO/VICODIN) 5-325 MG tablet Take 1-2 tablets by mouth every 4 (four) hours as needed for moderate pain. 30 tablet 0  . levothyroxine (SYNTHROID, LEVOTHROID) 125 MCG tablet Take 1 tablet (125 mcg total) by mouth daily. 30 tablet 0  . Multiple Vitamin (MULTIVITAMIN) tablet Take 1 tablet by mouth daily.    . Multiple Vitamins-Minerals (PRESERVISION AREDS 2 PO) Take 1 tablet by mouth 2 (two) times daily.    Marland Kitchen neomycin-bacitracin-polymyxin (NEOSPORIN) ointment Apply 1 application topically as needed (irritation).    Vladimir Faster Glycol-Propyl Glycol (SYSTANE OP) Place 1 drop into both eyes daily as needed (irritation).    . sodium chloride (OCEAN) 0.65 % SOLN nasal spray Place 1 spray into both nostrils as needed for congestion.    . traMADol (ULTRAM) 50 MG tablet Take 1-2 tablets (50-100 mg total) by mouth every 6 (six) hours as needed for moderate pain. 20 tablet 0   No current facility-administered medications for this visit.     REVIEW OF SYSTEMS:    A 10+ POINT REVIEW OF SYSTEMS WAS OBTAINED including neurology, dermatology, psychiatry, cardiac, respiratory, lymph, extremities, GI, GU, Musculoskeletal, constitutional, breasts, reproductive, HEENT.  All pertinent positives are noted in the HPI.  All others are negative.   PHYSICAL EXAMINATION: ECOG PERFORMANCE STATUS: 2 - Symptomatic, <50% confined to bed  . Vitals:   04/06/18 1017  BP: (!) 141/63  Pulse: 77  Resp: 18  Temp: 97.9 F (36.6 C)  SpO2: 98%   Filed Weights   04/06/18 1017  Weight: 176 lb 1.6 oz (79.9 kg)   .Body mass index is 29.3 kg/m.  GENERAL:alert, in no acute distress and comfortable SKIN: no acute rashes, no significant lesions EYES: conjunctiva are pink and non-injected, sclera anicteric OROPHARYNX: MMM, no exudates,  no oropharyngeal erythema or ulceration NECK: supple, no JVD LYMPH:  no palpable lymphadenopathy in the cervical, axillary or inguinal regions LUNGS: clear to  auscultation b/l with normal respiratory effort HEART: regular rate & rhythm ABDOMEN:  normoactive bowel sounds , non tender, not distended. Left medial lower gluteal ulcer packed with dressing Extremity: no pedal edema PSYCH: alert & oriented x 3 with fluent speech NEURO: no focal motor/sensory deficits   LABORATORY DATA:  I have reviewed the data as listed  . CBC Latest Ref Rng & Units 03/24/2018 02/28/2018 01/17/2018  WBC 4.0 - 10.3 K/uL 6.1 5.3 5.3  Hemoglobin 13.0 - 17.1 g/dL 12.6(L) 13.0 12.7(L)  Hematocrit 38.4 - 49.9 % 37.0(L) 38.3(L) 36.7(L)  Platelets 140 - 400 K/uL 252 230 262.0   . CBC    Component Value Date/Time   WBC 6.1 03/24/2018 0901   RBC 4.19 (L) 03/24/2018 0901   HGB 12.6 (L) 03/24/2018 0901   HCT 37.0 (L) 03/24/2018 0901   PLT 252 03/24/2018 0901   MCV 88.2 03/24/2018 0901   MCH 30.0 03/24/2018 0901   MCHC 34.0 03/24/2018 0901   RDW 14.0 03/24/2018 0901   LYMPHSABS 1.3 03/24/2018 0901   MONOABS 0.5 03/24/2018 0901   EOSABS 0.1 03/24/2018 0901   BASOSABS 0.1 03/24/2018 0901    CMP Latest Ref Rng & Units 03/24/2018 02/28/2018 01/17/2018  Glucose 70 - 99 mg/dL 98 87 93  BUN 8 - 23 mg/dL 14 17 14   Creatinine 0.61 - 1.24 mg/dL 0.82 0.74 0.67  Sodium 135 - 145 mmol/L 137 141 138  Potassium 3.5 - 5.1 mmol/L 4.3 4.7 4.1  Chloride 98 - 111 mmol/L 106 107 104  CO2 22 - 32 mmol/L 26 28 28   Calcium 8.9 - 10.3 mg/dL 9.1 9.1 8.8  Total Protein 6.5 - 8.1 g/dL 7.0 - 6.8  Total Bilirubin 0.3 - 1.2 mg/dL 0.3 - 0.5  Alkaline Phos 38 - 126 U/L 89 - 74  AST 15 - 41 U/L 12(L) - 14  ALT 0 - 44 U/L 14 - 14   . Lab Results  Component Value Date   LDH 185 03/24/2018   Component     Latest Ref Rng & Units 03/24/2018  LDH     98 - 192 U/L 185  HCV Ab     0.0 - 0.9 s/co ratio 0.2  Hep B Core Ab, Tot     Negative Negative  Hepatitis B Surface Ag     Negative Negative  HIV Screen 4th Generation wRfx     Non Reactive Non Reactive    03/11/18 Perineum  Bx:    RADIOGRAPHIC STUDIES: I have personally reviewed the radiological images as listed and agreed with the findings in the report. No results found.  ASSESSMENT & PLAN:   84 y.o. male with  1. Diffuse Large B-Cell Lymphoma -newly diagnosed presenting as a mass in the left medial gluteal region/perineum s/p resection. 03/11/18 Surgical bx which revealed Diffuse Large B-Cell Non-Hodgkin's Lymphoma   PLAN: -Discussed pt labwork from 03/24/18; blood counts and chemistries are stable  -No Hep B, Hep C or HIV from 03/24/18 labs -Ordered ECHO and PET/CT -Planning R-CHOP if ECHO is not prohibitive -If PET/CT confirms Stage 1E or early stage, would like to allow surgical site to completely heal prior to beginning treatment and plan to perform  3-4 cycles of R-CHOP +/- ISRT -Will see pt back in 10-12 days with PET/CT  2.  Patient Active Problem List  Diagnosis Date Noted  . Diffuse large B-cell lymphoma ([place]) 03/30/2018  . Perineal mass, male 03/11/2018  . Perineal mass in male 03/08/2018  . Degeneration of lumbar intervertebral disc 10/05/2017  . History of total hip arthroplasty 10/05/2017  . Combined forms of age-related cataract of both eyes 04/12/2014  . OA (osteoarthritis) of hip 05/31/2013  . History of revision of total replacement of left hip joint 05/31/2013  . High risk medication use 10/25/2012  . Vitreomacular adhesion 05/24/2012  . Posterior vitreous detachment 04/12/2012  . Cystoid macular edema 11/10/2011  . Subretinal neovascularization of macula 09/11/2011  . Retinal pigment epithelial detachment, bilateral 09/11/2011  . Macular degeneration of both eyes 06/09/2011  . History of thyroid cancer 04/21/2011  . Macular degeneration 01/05/2011  . CARCINOMA, THYROID GLAND, PAPILLARY 11/20/2008  . HYPOTHYROIDISM, POSTSURGICAL 11/20/2008  . Osteoarthritis 07/01/2007  . LOW BACK PAIN 07/01/2007  . Uric acid renal calculus 06/20/2007  . Essential hypertension 06/20/2007    -continue f/u with PCP   Plz schedule PET/CT ASAP in 5 days (order is in) for diffuse large B cell lymphoma ECHO ASAP in 5 days RTC with Dr Irene Limbo and for chemo-counseling post MD visit in 10-12 days (after PET/CT and ECHO) Chemotherapy R-CHOP to start in 15 days - pending ECHO and PET/CT   All of the patients questions were answered with apparent satisfaction. The patient knows to call the clinic with any problems, questions or concerns.  The total time spent in the appt was 25 minutes and more than 50% was on counseling and direct patient cares.   Sullivan Lone MD MS AAHIVMS [facility] Hematology/Oncology Physician [facility]  (Office):       [phone] (Work cell):  [phone] (Fax):           [phone]  04/06/2018 11:20 AM  I, Baldwin [place], am acting as a scribe for Dr. Irene Limbo  .I have reviewed the above documentation for accuracy and completeness, and I agree with the above. Brunetta Genera MD

## 2018-04-06 ENCOUNTER — Inpatient Hospital Stay: Payer: Medicare PPO | Admitting: Hematology

## 2018-04-06 ENCOUNTER — Telehealth: Payer: Self-pay | Admitting: Hematology

## 2018-04-06 VITALS — BP 141/63 | HR 77 | Temp 97.9°F | Resp 18 | Ht 65.0 in | Wt 176.1 lb

## 2018-04-06 DIAGNOSIS — M1990 Unspecified osteoarthritis, unspecified site: Secondary | ICD-10-CM | POA: Diagnosis not present

## 2018-04-06 DIAGNOSIS — I10 Essential (primary) hypertension: Secondary | ICD-10-CM

## 2018-04-06 DIAGNOSIS — E890 Postprocedural hypothyroidism: Secondary | ICD-10-CM | POA: Diagnosis not present

## 2018-04-06 DIAGNOSIS — M67431 Ganglion, right wrist: Secondary | ICD-10-CM

## 2018-04-06 DIAGNOSIS — Z87442 Personal history of urinary calculi: Secondary | ICD-10-CM

## 2018-04-06 DIAGNOSIS — Z8619 Personal history of other infectious and parasitic diseases: Secondary | ICD-10-CM

## 2018-04-06 DIAGNOSIS — Z79899 Other long term (current) drug therapy: Secondary | ICD-10-CM

## 2018-04-06 DIAGNOSIS — H3530 Unspecified macular degeneration: Secondary | ICD-10-CM | POA: Diagnosis not present

## 2018-04-06 DIAGNOSIS — Z85850 Personal history of malignant neoplasm of thyroid: Secondary | ICD-10-CM

## 2018-04-06 DIAGNOSIS — Z9889 Other specified postprocedural states: Secondary | ICD-10-CM

## 2018-04-06 DIAGNOSIS — Z87891 Personal history of nicotine dependence: Secondary | ICD-10-CM

## 2018-04-06 DIAGNOSIS — C8330 Diffuse large B-cell lymphoma, unspecified site: Secondary | ICD-10-CM

## 2018-04-06 DIAGNOSIS — R630 Anorexia: Secondary | ICD-10-CM | POA: Diagnosis not present

## 2018-04-06 NOTE — Telephone Encounter (Signed)
ECHO/ PET-CT scheduled appointments scheduled- no treatment plan in. IB message to [facility] for ECHO Auth per 7/31 los

## 2018-04-06 NOTE — Patient Instructions (Signed)
We discussed your diagnosis of Diffuse Large B- Cell Lymphoma We discussed the recommendation to begin chemotherapy after collecting a PET/CT scan We will order a PET/CT scan for staging purposes We will order a heart Ultra Sound (called an ECHO) for heart clearance to begin chemotherapy We would like to allow your surgical site to heal before beginning chemotherapy, unless the PET/CT scan reveals a need to begin treatment sooner than later.

## 2018-04-07 ENCOUNTER — Telehealth: Payer: Self-pay

## 2018-04-07 NOTE — Telephone Encounter (Signed)
Per 7/31 los completed by [employer] (scheduler). Removing from dar

## 2018-04-11 NOTE — Progress Notes (Signed)
START ON PATHWAY REGIMEN - Lymphoma and CLL     A cycle is every 21 days:     Prednisone      Rituximab      Cyclophosphamide      Doxorubicin      Vincristine   **Always confirm dose/schedule in your pharmacy ordering system**  Patient Characteristics: Diffuse Large B-Cell Lymphoma, First Line, Stage I and II, No Bulk Disease Type: Not Applicable Disease Type: Diffuse Large B-Cell Lymphoma Disease Type: Not Applicable Line of therapy: First Line [place] Stage: Unknown Disease Characteristics: No Bulk Intent of Therapy: Curative Intent, Discussed with Patient

## 2018-04-13 ENCOUNTER — Ambulatory Visit (HOSPITAL_COMMUNITY)
Admission: RE | Admit: 2018-04-13 | Discharge: 2018-04-13 | Disposition: A | Discharge status: Home / Self Care | Payer: Medicare PPO | Source: Ambulatory Visit | Attending: Hematology | Admitting: Hematology

## 2018-04-13 DIAGNOSIS — C8330 Diffuse large B-cell lymphoma, unspecified site: Secondary | ICD-10-CM

## 2018-04-13 DIAGNOSIS — I10 Essential (primary) hypertension: Secondary | ICD-10-CM | POA: Diagnosis not present

## 2018-04-13 NOTE — Progress Notes (Signed)
  Echocardiogram 2D Echocardiogram has been performed.  Keri Veale L Androw 04/13/2018, 10:47 AM

## 2018-04-14 DIAGNOSIS — H353211 Exudative age-related macular degeneration, right eye, with active choroidal neovascularization: Secondary | ICD-10-CM | POA: Diagnosis not present

## 2018-04-14 DIAGNOSIS — H353231 Exudative age-related macular degeneration, bilateral, with active choroidal neovascularization: Secondary | ICD-10-CM | POA: Diagnosis not present

## 2018-04-14 DIAGNOSIS — H35371 Puckering of macula, right eye: Secondary | ICD-10-CM | POA: Diagnosis not present

## 2018-04-14 DIAGNOSIS — H35723 Serous detachment of retinal pigment epithelium, bilateral: Secondary | ICD-10-CM | POA: Diagnosis not present

## 2018-04-18 ENCOUNTER — Ambulatory Visit (HOSPITAL_COMMUNITY)
Admission: RE | Admit: 2018-04-18 | Discharge: 2018-04-18 | Disposition: A | Discharge status: Home / Self Care | Payer: Medicare PPO | Source: Ambulatory Visit | Attending: Hematology | Admitting: Hematology

## 2018-04-18 DIAGNOSIS — N200 Calculus of kidney: Secondary | ICD-10-CM | POA: Diagnosis not present

## 2018-04-18 DIAGNOSIS — R590 Localized enlarged lymph nodes: Secondary | ICD-10-CM | POA: Insufficient documentation

## 2018-04-18 DIAGNOSIS — I2510 Atherosclerotic heart disease of native coronary artery without angina pectoris: Secondary | ICD-10-CM | POA: Diagnosis not present

## 2018-04-18 DIAGNOSIS — C8330 Diffuse large B-cell lymphoma, unspecified site: Secondary | ICD-10-CM | POA: Diagnosis not present

## 2018-04-18 DIAGNOSIS — R918 Other nonspecific abnormal finding of lung field: Secondary | ICD-10-CM | POA: Insufficient documentation

## 2018-04-18 DIAGNOSIS — C8333 Diffuse large B-cell lymphoma, intra-abdominal lymph nodes: Secondary | ICD-10-CM | POA: Diagnosis not present

## 2018-04-18 DIAGNOSIS — I700 Atherosclerosis of aorta: Secondary | ICD-10-CM | POA: Insufficient documentation

## 2018-04-18 LAB — GLUCOSE, CAPILLARY: Glucose-Capillary: 103 mg/dL — ABNORMAL HIGH (ref 70–99)

## 2018-04-18 MED ORDER — FLUDEOXYGLUCOSE F - 18 (FDG) INJECTION
8.7000 | Freq: Once | INTRAVENOUS | Status: AC | PRN
  Administered 2018-04-18: 8.7 via INTRAVENOUS

## 2018-04-28 NOTE — Progress Notes (Signed)
HEMATOLOGY/ONCOLOGY CLINIC NOTE  Date of Service: 04/29/2018  Patient Care Team: Billie Ruddy, MD as PCP - General (Family Medicine)  CHIEF COMPLAINTS/PURPOSE OF CONSULTATION:  Diffuse Large B-Cell Lymphoma  HISTORY OF PRESENTING ILLNESS:   Jay Webster is a wonderful 84 y.o. male who has been referred to [place] by surgeon Dr. Armandina Gemma for evaluation and management of Diffuse Large B-Cell Lymphoma. The pt reports that he is doing well overall.   Of note prior to the patient's visit today, pt has had Perineum biopsy completed on 03/11/18 with results revealing Diffuse Large B-Cell Lymphoma. The pt had surgery to remove his irregularly shaped mass in the left anterior portion of the perineum with Dr. Armandina Gemma.  The pt reports first noticing the mass in his left buttock about 2 months ago, after a visit with Dr. Trena Platt. The pt decided to consult his surgeon Dr. Armandina Gemma whom he previously had a thyroidectomy with. The pt notes that the spot became as large as a golf ball and did not breach the skin. The pt notes that it wasn't terribly uncomfortable to sit on. He denies feeling any differently recently than in the past  6 months to a year.   The pt notes that he has been having some continued discharge and is using Depends. He is changing his gauze twice each day.   He denies any recent medical problems. He continues to function independently at home and lives alone, as his partner of 30+ years died in the past year. He notes that he does not have any family members or friends who he would want to include in his care. He denies any difficulties functioning day to day and denies concerns for his memory.   He has been on thyroid replacement for the last 10 years following a thyroidectomy after thyroid cancer, and was treated with one dose of radioactive iodine.   The pt also notes a uric acid kidney stone history which hasn't been a problem in many years.    Most  recent lab results (02/28/18) of CBC  is as follows: all values are WNL except for HCT at 38.3.  On review of systems, pt reports left buttock surgical wound, ganglion cyst on right wrist, and denies fevers, chills, night sweats, unexpected weight loss, new fatigue, pain along the spine, leg swelling, noticing any new lumps or bumps, and any other symptoms.   On PMHx the pt denies heart problems, strokes, abdominal problems, lung problems.  On Social Hx the pt reports that he quit smoking in 1990, and used to smoke 1-2 packs of cigarettes each day before this. He denies excess alcohol being a problem, and consumes one glass of wine rarely. 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  On Family Hx the pt reports brother died of sarcoma, different brother with Type I DM, and denies other cancer or blood disorders.  Interval History:   Jay Webster returns today regarding his recently diagnosed Diffuse Large B-Cell Lymphoma. The patient's last visit with [place] was on 04/06/18. The pt reports that he is doing well overall.   The pt reports that he is having less drainage, now having very little from his surgical site but it has healed very well overall and he is no longer able to/needing to pack the wound with gauze. He denies discomfort at the surgical site, fevers, chills, and any local pain.   Of note  since the patient's last visit, pt has had PET/CT completed on 04/18/18 with results revealing Mildly hypermetabolic small to borderline enlarged abdominal and pelvic retroperitoneal lymph nodes. Probable mild hypermetabolism within subcentimeter axillary lymph nodes, with misregistration on fused images. 2. Scattered lucent lesions in the iliac wings and right fifth rib. Continued attention on follow-up exams is warranted. 3. Scattered small pulmonary nodules are nonspecific and too small for PET resolution. Continued attention on follow-up exams is warranted. 4.  Intermediate density lesion deep to the left gluteal fold, with associated hypermetabolism, which may represent a complex cyst or abscess  On review of systems, pt reports continued healing of his surgical site, and denies fevers, chills, local pain, and any other symptoms.    MEDICAL HISTORY:  Past Medical History:  Diagnosis Date  . Arthritis    OA AND PAIN LEFT HIP AND OTHER JOINT PAINS  . Cancer (HCC)    THYROID CANCER - HAD THYROID REMOVED  . History of kidney stones   . History of shingles    RESOLVED  . Hypertension   . Hypothyroidism   . Low back pain   . Macular degeneration of both eyes   . RBBB (right bundle branch block with left anterior fascicular block)   . Seasonal allergies   . Thyroid disease   . Uric acid renal calculus 06/20/2007   Qualifier: Diagnosis of  By: Rogue Bussing CMA, Maryann Alar      SURGICAL HISTORY: Past Surgical History:  Procedure Laterality Date  . APPENDECTOMY  1948  . COLONOSCOPY    . Elbow Radical Reduction  1973  . [place]  . MASS EXCISION N/A 03/11/2018   Procedure: EXCISION MASS OF PERINEUM;  Surgeon: Armandina Gemma, MD;  Location: [facility];  Service: General;  Laterality: N/A;  . REMOVAL OF FIRST RIB   NOV 1994   FOR COMPRESSED VEIN BETWEEN 1 ST RIB AND COLLARBONE  . Rib removed, 1st  1994  . ROTATOR CUFF REPAIR  2009  . THYROIDECTOMY  2010  . TOTAL HIP ARTHROPLASTY Left 05/31/2013   Procedure: LEFT TOTAL HIP ARTHROPLASTY ANTERIOR APPROACH;  Surgeon: Gearlean Alf, MD;  Location: [facility];  Service: Orthopedics;  Laterality: Left;    SOCIAL HISTORY: Social History   Socioeconomic History  . Marital status: Widowed    Spouse name: Not on file  . Number of children: Not on file  . Years of education: Not on file  . Highest education level: Not on file  Occupational History  . Occupation: retired  Scientific laboratory technician  . Financial resource strain: Not on file  . Food insecurity:    Worry: Not on file    Inability:  Not on file  . Transportation needs:    Medical: Not on file    Non-medical: Not on file  Tobacco Use  . Smoking status: Former Smoker    Types: Cigarettes    Last attempt to quit: 02/22/1979    Years since quitting: 39.2  . Smokeless tobacco: Never Used  Substance and Sexual Activity  . Alcohol use: Yes    Alcohol/week: 0.0 standard drinks  . Drug use: No  . Sexual activity: Yes  Lifestyle  . Physical activity:    Days per week: Not on file    Minutes per session: Not on file  . Stress: Not on file  Relationships  . Social connections:    Talks on phone: Not on file    Gets together: Not on file  Attends religious service: Not on file    Active member of club or organization: Not on file    Attends meetings of clubs or organizations: Not on file    Relationship status: Not on file  . Intimate partner violence:    Fear of current or ex partner: Not on file    Emotionally abused: Not on file    Physically abused: Not on file    Forced sexual activity: Not on file  Other Topics Concern  . Not on file  Social History Narrative  . Not on file    FAMILY HISTORY: Family History  Problem Relation Age of Onset  . Heart disease Mother   . Diabetes Mother   . Anemia Unknown   . Thyroid disease Unknown     ALLERGIES:  is allergic to penicillins.  MEDICATIONS:  Current Outpatient Medications  Medication Sig Dispense Refill  . acetaminophen (TYLENOL) 325 MG tablet Take 325-650 mg by mouth daily as needed for headache.    . Aflibercept (EYLEA) 2 MG/0.05ML SOLN Inject 1 Dose into the eye every 8 (eight) weeks.     Marland Kitchen allopurinol (ZYLOPRIM) 300 MG tablet TAKE 1 TABLET (300 MG TOTAL) BY MOUTH DAILY IN THE EVENING 90 tablet 1  . benazepril (LOTENSIN) 20 MG tablet TAKE 1 TABLET (20 MG TOTAL) BY MOUTH DAILY. 90 tablet 2  . diphenhydrAMINE (BENADRYL) 25 MG tablet Take 25 mg by mouth daily as needed for allergies.    . Glucosamine-Chondroitin (OSTEO BI-FLEX REGULAR STRENGTH PO)  Take 1 tablet by mouth 2 (two) times daily.    Marland Kitchen HYDROcodone-acetaminophen (NORCO/VICODIN) 5-325 MG tablet Take 1-2 tablets by mouth every 4 (four) hours as needed for moderate pain. (Patient not taking: Reported on 04/29/2018) 30 tablet 0  . levothyroxine (SYNTHROID, LEVOTHROID) 125 MCG tablet Take 1 tablet (125 mcg total) by mouth daily. 30 tablet 0  . Multiple Vitamin (MULTIVITAMIN) tablet Take 1 tablet by mouth daily.    . Multiple Vitamins-Minerals (PRESERVISION AREDS 2 PO) Take 1 tablet by mouth 2 (two) times daily.    Marland Kitchen neomycin-bacitracin-polymyxin (NEOSPORIN) ointment Apply 1 application topically as needed (irritation).    Vladimir Faster Glycol-Propyl Glycol (SYSTANE OP) Place 1 drop into both eyes daily as needed (irritation).    . sodium chloride (OCEAN) 0.65 % SOLN nasal spray Place 1 spray into both nostrils as needed for congestion.    . traMADol (ULTRAM) 50 MG tablet Take 1-2 tablets (50-100 mg total) by mouth every 6 (six) hours as needed for moderate pain. (Patient not taking: Reported on 04/29/2018) 20 tablet 0   No current facility-administered medications for this visit.     REVIEW OF SYSTEMS:    A 10+ POINT REVIEW OF SYSTEMS WAS OBTAINED including neurology, dermatology, psychiatry, cardiac, respiratory, lymph, extremities, GI, GU, Musculoskeletal, constitutional, breasts, reproductive, HEENT.  All pertinent positives are noted in the HPI.  All others are negative.   PHYSICAL EXAMINATION: ECOG PERFORMANCE STATUS: 2 - Symptomatic, <50% confined to bed  . Vitals:   04/29/18 1023  BP: 133/69  Pulse: 72  Resp: 18  Temp: 98 F (36.7 C)  SpO2: 100%   Filed Weights   04/29/18 1023  Weight: 178 lb 3.2 oz (80.8 kg)   .Body mass index is 29.65 kg/m.  GENERAL:alert, in no acute distress and comfortable SKIN: no acute rashes, no significant lesions EYES: conjunctiva are pink and non-injected, sclera anicteric OROPHARYNX: MMM, no exudates, no oropharyngeal erythema or  ulceration NECK: supple, no JVD LYMPH:  no palpable lymphadenopathy in the cervical, axillary or inguinal regions LUNGS: clear to auscultation b/l with normal respiratory effort HEART: regular rate & rhythm ABDOMEN:  normoactive bowel sounds , non tender, not distended. Extremity: no pedal edema PSYCH: alert & oriented x 3 with fluent speech NEURO: no focal motor/sensory deficits   LABORATORY DATA:  I have reviewed the data as listed  . CBC Latest Ref Rng & Units 03/24/2018 02/28/2018 01/17/2018  WBC 4.0 - 10.3 K/uL 6.1 5.3 5.3  Hemoglobin 13.0 - 17.1 g/dL 12.6(L) 13.0 12.7(L)  Hematocrit 38.4 - 49.9 % 37.0(L) 38.3(L) 36.7(L)  Platelets 140 - 400 K/uL 252 230 262.0   . CBC    Component Value Date/Time   WBC 6.1 03/24/2018 0901   RBC 4.19 (L) 03/24/2018 0901   HGB 12.6 (L) 03/24/2018 0901   HCT 37.0 (L) 03/24/2018 0901   PLT 252 03/24/2018 0901   MCV 88.2 03/24/2018 0901   MCH 30.0 03/24/2018 0901   MCHC 34.0 03/24/2018 0901   RDW 14.0 03/24/2018 0901   LYMPHSABS 1.3 03/24/2018 0901   MONOABS 0.5 03/24/2018 0901   EOSABS 0.1 03/24/2018 0901   BASOSABS 0.1 03/24/2018 0901    CMP Latest Ref Rng & Units 03/24/2018 02/28/2018 01/17/2018  Glucose 70 - 99 mg/dL 98 87 93  BUN 8 - 23 mg/dL 14 17 14   Creatinine 0.61 - 1.24 mg/dL 0.82 0.74 0.67  Sodium 135 - 145 mmol/L 137 141 138  Potassium 3.5 - 5.1 mmol/L 4.3 4.7 4.1  Chloride 98 - 111 mmol/L 106 107 104  CO2 22 - 32 mmol/L 26 28 28   Calcium 8.9 - 10.3 mg/dL 9.1 9.1 8.8  Total Protein 6.5 - 8.1 g/dL 7.0 - 6.8  Total Bilirubin 0.3 - 1.2 mg/dL 0.3 - 0.5  Alkaline Phos 38 - 126 U/L 89 - 74  AST 15 - 41 U/L 12(L) - 14  ALT 0 - 44 U/L 14 - 14   . Lab Results  Component Value Date   LDH 185 03/24/2018   Component     Latest Ref Rng & Units 03/24/2018  LDH     98 - 192 U/L 185  HCV Ab     0.0 - 0.9 s/co ratio 0.2  Hep B Core Ab, Tot     Negative Negative  Hepatitis B Surface Ag     Negative Negative  HIV Screen 4th  Generation wRfx     Non Reactive Non Reactive    03/11/18 Perineum Bx:    RADIOGRAPHIC STUDIES: I have personally reviewed the radiological images as listed and agreed with the findings in the report. Nm Pet Image Initial (pi) Skull Base To Thigh  Result Date: 04/18/2018 CLINICAL DATA:  Initial treatment strategy for diffuse large B-cell lymphoma. History of thyroid cancer. EXAM: NUCLEAR MEDICINE PET SKULL BASE TO THIGH TECHNIQUE: 8.7 mCi F-18 FDG was injected intravenously. Full-ring PET imaging was performed from the skull base to thigh after the radiotracer. CT data was obtained and used for attenuation correction and anatomic localization. Fasting blood glucose: One hundred three mg/dl COMPARISON:  None. FINDINGS: Mediastinal blood pool activity: SUV max 1.5 Note is made of slight misregistration on fused images. NECK: No hypermetabolic lymph nodes in the neck. Incidental CT findings: None. CHEST: Axillary lymph nodes measure up to 8 mm on the right with a probable SUV max 2.2, given misregistration on the fused images. No hypermetabolic mediastinal or hilar lymph nodes. No hypermetabolic pulmonary nodules. Incidental CT findings: Atherosclerotic calcification  of the arterial vasculature, including coronary arteries. No pericardial or pleural effusion. Scattered pulmonary nodules measure up to 5 mm and are too small for PET resolution. ABDOMEN/PELVIS: No abnormal hypermetabolism in the liver, adrenal glands, spleen or pancreas. Aortocaval lymph nodes measure up to 7 mm (CT image 125) with an SUV max of 3.3. Bilateral external iliac lymph nodes are mildly hypermetabolic as well. Index right external iliac lymph node measures 10 mm (CT image 160) with an SUV max of 4.0. Finally, a somewhat ill-defined intermediate density lesion within internal locule of air is seen deep to the left gluteal fold, measuring 2.0 x 3.1 cm (CT image 191), with associated hypermetabolism. Incidental CT findings: Left renal  stone. Atherosclerotic calcification of the arterial vasculature. No free fluid. SKELETON: No abnormal osseous hypermetabolism. Incidental CT findings: Left hip arthroplasty. Degenerative changes in the spine and sacroiliac joints. Several lucent lesions are seen in the iliac wings without associated hypermetabolism. A small lucent lesion is also seen in the lateral aspect right fifth rib. IMPRESSION: 1. Mildly hypermetabolic small to borderline enlarged abdominal and pelvic retroperitoneal lymph nodes. Probable mild hypermetabolism within subcentimeter axillary lymph nodes, with misregistration on fused images. 2. Scattered lucent lesions in the iliac wings and right fifth rib. Continued attention on follow-up exams is warranted. 3. Scattered small pulmonary nodules are nonspecific and too small for PET resolution. Continued attention on follow-up exams is warranted. 4. Intermediate density lesion deep to the left gluteal fold, with associated hypermetabolism, which may represent a complex cyst or abscess. 5. Aortic atherosclerosis (ICD10-170.0). Coronary artery calcification. 6. Left renal stone. Electronically Signed   By: Lorin Picket M.D.   On: 04/18/2018 09:17    ASSESSMENT & PLAN:   84 y.o. male with  1. Diffuse Large B-Cell Lymphoma -newly diagnosed presenting as a mass in the left medial gluteal region/perineum s/p resection. 03/11/18 Surgical bx which revealed Diffuse Large B-Cell Non-Hodgkin's Lymphoma  04/13/18 ECHO revealed LV EF of 60-65%   PLAN: -No Hep B, Hep C or HIV from 03/24/18 labs -Discussed the 04/18/18 PET/CT which revealed Mildly hypermetabolic small to borderline enlarged abdominal and pelvic retroperitoneal lymph nodes. Probable mild hypermetabolism within subcentimeter axillary lymph nodes, with misregistration on fused images. 2. Scattered lucent lesions in the iliac wings and right fifth rib. Continued attention on follow-up exams is warranted. 3. Scattered small pulmonary  nodules are nonspecific and too small for PET resolution. Continued attention on follow-up exams is warranted. 4. Intermediate density lesion deep to the left gluteal fold, with associated hypermetabolism, which may represent a complex cyst or abscess. 5. Aortic atherosclerosis. Coronary artery calcification. 6. Left renal stone. -Will discuss this patient's case in my tumor board -Pt clinically is not symptomatic of an abscess and the PET/CT finding is likely a cyst, pt will let me know if he develops any signs of infection -On physical exam the pt's surgical site is nearly completely healed -Discussed that the staging is still being determined on tumor board imaging review, which will determine the extent of treatment, but either way we will begin C1 R-CHOP on 05/10/18 -Will set the pt up for chemotherapy counseling -Will refer the pt for [facility] placement   2.  Patient Active Problem List   Diagnosis Date Noted  . Diffuse large B-cell lymphoma ([place]) 03/30/2018  . Perineal mass, male 03/11/2018  . Perineal mass in male 03/08/2018  . Degeneration of lumbar intervertebral disc 10/05/2017  . History of total hip arthroplasty 10/05/2017  . Combined forms  of age-related cataract of both eyes 04/12/2014  . OA (osteoarthritis) of hip 05/31/2013  . History of revision of total replacement of left hip joint 05/31/2013  . High risk medication use 10/25/2012  . Vitreomacular adhesion 05/24/2012  . Posterior vitreous detachment 04/12/2012  . Cystoid macular edema 11/10/2011  . Subretinal neovascularization of macula 09/11/2011  . Retinal pigment epithelial detachment, bilateral 09/11/2011  . Macular degeneration of both eyes 06/09/2011  . History of thyroid cancer 04/21/2011  . Macular degeneration 01/05/2011  . CARCINOMA, THYROID GLAND, PAPILLARY 11/20/2008  . HYPOTHYROIDISM, POSTSURGICAL 11/20/2008  . Osteoarthritis 07/01/2007  . LOW BACK PAIN 07/01/2007  . Uric acid renal calculus 06/20/2007  .  Essential hypertension 06/20/2007   -continue f/u with PCP   Referral for [facility] a cath placement to Dr Armandina Gemma his surgeon F/u for C1 of R-CHOP with neulasta and labs as scheduled on 9/3 RTC with Dr Irene Limbo 10 days post C1 of treatment with labs for a toxcity check    All of the patients questions were answered with apparent satisfaction. The patient knows to call the clinic with any problems, questions or concerns.  The total time spent in the appt was 30 minutes and more than 50% was on counseling and direct patient cares.   Sullivan Lone MD MS AAHIVMS [facility] Hematology/Oncology Physician [facility]  (Office):       [phone] (Work cell):  [phone] (Fax):           [phone]  04/29/2018 11:07 AM  I, Baldwin [place], am acting as a scribe for Dr. Irene Limbo  .I have reviewed the above documentation for accuracy and completeness, and I agree with the above. Brunetta Genera MD

## 2018-04-29 ENCOUNTER — Inpatient Hospital Stay: Payer: Medicare PPO | Attending: Hematology | Admitting: Hematology

## 2018-04-29 ENCOUNTER — Encounter: Payer: Self-pay | Admitting: Hematology

## 2018-04-29 ENCOUNTER — Inpatient Hospital Stay: Payer: Medicare PPO

## 2018-04-29 ENCOUNTER — Telehealth: Payer: Self-pay | Admitting: General Practice

## 2018-04-29 VITALS — BP 133/69 | HR 72 | Temp 98.0°F | Resp 18 | Ht 65.0 in | Wt 178.2 lb

## 2018-04-29 DIAGNOSIS — Z87891 Personal history of nicotine dependence: Secondary | ICD-10-CM | POA: Insufficient documentation

## 2018-04-29 DIAGNOSIS — C8330 Diffuse large B-cell lymphoma, unspecified site: Secondary | ICD-10-CM | POA: Insufficient documentation

## 2018-04-29 DIAGNOSIS — E890 Postprocedural hypothyroidism: Secondary | ICD-10-CM | POA: Diagnosis not present

## 2018-04-29 DIAGNOSIS — R918 Other nonspecific abnormal finding of lung field: Secondary | ICD-10-CM | POA: Insufficient documentation

## 2018-04-29 DIAGNOSIS — I10 Essential (primary) hypertension: Secondary | ICD-10-CM | POA: Diagnosis not present

## 2018-04-29 DIAGNOSIS — Z9889 Other specified postprocedural states: Secondary | ICD-10-CM

## 2018-04-29 DIAGNOSIS — Z85850 Personal history of malignant neoplasm of thyroid: Secondary | ICD-10-CM | POA: Diagnosis not present

## 2018-04-29 DIAGNOSIS — Z87442 Personal history of urinary calculi: Secondary | ICD-10-CM | POA: Diagnosis not present

## 2018-04-29 DIAGNOSIS — M67431 Ganglion, right wrist: Secondary | ICD-10-CM | POA: Diagnosis not present

## 2018-04-29 DIAGNOSIS — H3530 Unspecified macular degeneration: Secondary | ICD-10-CM | POA: Insufficient documentation

## 2018-04-29 DIAGNOSIS — R590 Localized enlarged lymph nodes: Secondary | ICD-10-CM | POA: Diagnosis not present

## 2018-04-29 DIAGNOSIS — Z79899 Other long term (current) drug therapy: Secondary | ICD-10-CM | POA: Insufficient documentation

## 2018-04-29 NOTE — Patient Instructions (Signed)
I will discuss your case with my tumor board You and I discussed beginning your treatment on 05/10/18, and completing either three cycles, or up to 5-6 cycles. The extent of cycles will be indicated by the tumor board's discussion, and your tolerance and response to treatment.  Discussed that the staging is still being determined. We will begin a combination chemotherapy and immunotherapy called R-CHOP on 05/10/18

## 2018-04-29 NOTE — Telephone Encounter (Signed)
[place] CSW Progress Note  Request received from chemo ed nurse to reach out to patient re needs for support, transportation and needs related to cancer diagnosis and treatment.  Call made to patient, left VM inviting him to call back and discuss needs.    Edwyna Shell, LCSW Clinical Social Worker Phone:  [phone]

## 2018-05-02 ENCOUNTER — Telehealth: Payer: Self-pay

## 2018-05-02 NOTE — Telephone Encounter (Signed)
Patient called stating that Dr. Irene Limbo told him he would prescribe him some medications and he has not as of yet.  He did not know the names of the medications but said they were related to his upcoming treatments.  The message was passed along to Dr. Irene Limbo.

## 2018-05-03 ENCOUNTER — Encounter: Payer: Self-pay | Admitting: *Deleted

## 2018-05-03 ENCOUNTER — Other Ambulatory Visit: Payer: Self-pay | Admitting: Hematology

## 2018-05-03 DIAGNOSIS — C8330 Diffuse large B-cell lymphoma, unspecified site: Secondary | ICD-10-CM

## 2018-05-03 NOTE — Telephone Encounter (Signed)
[place] CSW Progress Notes  Left VM for patient, awaiting return call.  Edwyna Shell, LCSW Clinical Social Worker Phone:  [phone]

## 2018-05-03 NOTE — Progress Notes (Signed)
[place] Work  Holiday representative received return call from patient requesting transportation assistance.  CSW educated patient on the [facility] transportation program, and patient was agreeable to participate.  CSW forwarded patients information to transportation coordinator, who will follow up with patient to completed the registration process.    Jay Webster, MSW, LCSW, OSW-C Clinical Social Worker [facility] [phone]

## 2018-05-04 ENCOUNTER — Telehealth: Payer: Self-pay

## 2018-05-04 ENCOUNTER — Other Ambulatory Visit: Payer: Self-pay | Admitting: Hematology

## 2018-05-04 ENCOUNTER — Telehealth: Payer: Self-pay | Admitting: General Practice

## 2018-05-04 DIAGNOSIS — C8330 Diffuse large B-cell lymphoma, unspecified site: Secondary | ICD-10-CM

## 2018-05-04 MED ORDER — ONDANSETRON HCL 8 MG PO TABS: 8.0000 mg | ORAL_TABLET | Freq: Two times a day (BID) | ORAL | 1 refills | Status: DC | PRN

## 2018-05-04 MED ORDER — PROCHLORPERAZINE MALEATE 10 MG PO TABS: 10.0000 mg | ORAL_TABLET | Freq: Four times a day (QID) | ORAL | 6 refills | Status: DC | PRN

## 2018-05-04 MED ORDER — LIDOCAINE-PRILOCAINE 2.5-2.5 % EX CREA: TOPICAL_CREAM | CUTANEOUS | 3 refills | Status: DC

## 2018-05-04 MED ORDER — PREDNISONE 20 MG PO TABS: 60.0000 mg | ORAL_TABLET | Freq: Every day | ORAL | 6 refills | Status: DC

## 2018-05-04 NOTE — Telephone Encounter (Signed)
Patient scheduled for port a cath placement Thursday 05/05/18 at 10:00 am at [facility] with arrival at 8am. Patient verbalized understanding of appointment time and location and nothing to eat or drink after midnight. Patient aware that he needs a driver and to report to admitting at [facility] for check in. Patient verbalized understanding of all instructions.

## 2018-05-04 NOTE — Telephone Encounter (Signed)
[place] CSW Progress Notes  Spoke w patient re arrangements for port placement surgery tomorrow.  He is arranging for friend to transport and stay w him after procedure.  Will work out his own arrangements for this procedure. Staff messages desk RN to connect w patient as he may have questions re prep for surgery.  Also sent patient location and contact information for Interventional Radiology.  Pt referred to [facility] for linkage w [facility] transport for future appts at [facility].  Edwyna Shell, LCSW Clinical Social Worker Phone:  [phone]

## 2018-05-05 ENCOUNTER — Encounter (HOSPITAL_COMMUNITY): Payer: Self-pay | Admitting: Interventional Radiology

## 2018-05-05 ENCOUNTER — Ambulatory Visit (HOSPITAL_COMMUNITY)
Admission: RE | Admit: 2018-05-05 | Discharge: 2018-05-05 | Disposition: A | Discharge status: Home / Self Care | Payer: Medicare PPO | Source: Ambulatory Visit | Attending: Hematology | Admitting: Hematology

## 2018-05-05 ENCOUNTER — Other Ambulatory Visit: Payer: Self-pay | Admitting: Student

## 2018-05-05 ENCOUNTER — Other Ambulatory Visit: Payer: Self-pay | Admitting: Radiology

## 2018-05-05 ENCOUNTER — Other Ambulatory Visit: Payer: Self-pay

## 2018-05-05 ENCOUNTER — Telehealth: Payer: Self-pay | Admitting: General Practice

## 2018-05-05 DIAGNOSIS — C8330 Diffuse large B-cell lymphoma, unspecified site: Secondary | ICD-10-CM | POA: Diagnosis not present

## 2018-05-05 DIAGNOSIS — Z85850 Personal history of malignant neoplasm of thyroid: Secondary | ICD-10-CM | POA: Insufficient documentation

## 2018-05-05 DIAGNOSIS — M1990 Unspecified osteoarthritis, unspecified site: Secondary | ICD-10-CM | POA: Insufficient documentation

## 2018-05-05 DIAGNOSIS — Z880 Allergy status to penicillin: Secondary | ICD-10-CM | POA: Insufficient documentation

## 2018-05-05 DIAGNOSIS — I452 Bifascicular block: Secondary | ICD-10-CM | POA: Insufficient documentation

## 2018-05-05 DIAGNOSIS — Z87891 Personal history of nicotine dependence: Secondary | ICD-10-CM | POA: Insufficient documentation

## 2018-05-05 DIAGNOSIS — I10 Essential (primary) hypertension: Secondary | ICD-10-CM | POA: Diagnosis not present

## 2018-05-05 DIAGNOSIS — E039 Hypothyroidism, unspecified: Secondary | ICD-10-CM | POA: Diagnosis not present

## 2018-05-05 DIAGNOSIS — C8338 Diffuse large B-cell lymphoma, lymph nodes of multiple sites: Secondary | ICD-10-CM | POA: Diagnosis not present

## 2018-05-05 DIAGNOSIS — Z7952 Long term (current) use of systemic steroids: Secondary | ICD-10-CM | POA: Insufficient documentation

## 2018-05-05 HISTORY — PX: IR IMAGING GUIDED PORT INSERTION: IMG5740

## 2018-05-05 LAB — APTT: aPTT: 41 seconds — ABNORMAL HIGH (ref 24–36)

## 2018-05-05 LAB — CBC
HEMATOCRIT: 37.5 % — AB (ref 39.0–52.0)
Hemoglobin: 12.5 g/dL — ABNORMAL LOW (ref 13.0–17.0)
MCH: 30 pg (ref 26.0–34.0)
MCHC: 33.3 g/dL (ref 30.0–36.0)
MCV: 89.9 fL (ref 78.0–100.0)
Platelets: 210 10*3/uL (ref 150–400)
RBC: 4.17 MIL/uL — AB (ref 4.22–5.81)
RDW: 13.5 % (ref 11.5–15.5)
WBC: 5 10*3/uL (ref 4.0–10.5)

## 2018-05-05 LAB — PROTIME-INR
INR: 1.12
Prothrombin Time: 14.3 seconds (ref 11.4–15.2)

## 2018-05-05 MED ORDER — LIDOCAINE-EPINEPHRINE (PF) 1 %-1:200000 IJ SOLN
INTRAMUSCULAR | Status: AC
  Filled 2018-05-05: qty 30

## 2018-05-05 MED ORDER — VANCOMYCIN HCL IN DEXTROSE 1-5 GM/200ML-% IV SOLN
1000.0000 mg | INTRAVENOUS | Status: AC
  Administered 2018-05-05: 1000 mg via INTRAVENOUS

## 2018-05-05 MED ORDER — FENTANYL CITRATE (PF) 100 MCG/2ML IJ SOLN
INTRAMUSCULAR | Status: AC
  Filled 2018-05-05: qty 2

## 2018-05-05 MED ORDER — FENTANYL CITRATE (PF) 100 MCG/2ML IJ SOLN
INTRAMUSCULAR | Status: AC | PRN
  Administered 2018-05-05 (×2): 50 ug via INTRAVENOUS

## 2018-05-05 MED ORDER — VANCOMYCIN HCL IN DEXTROSE 1-5 GM/200ML-% IV SOLN
INTRAVENOUS | Status: AC
  Administered 2018-05-05: 1000 mg via INTRAVENOUS
  Filled 2018-05-05: qty 200

## 2018-05-05 MED ORDER — HEPARIN SOD (PORK) LOCK FLUSH 100 UNIT/ML IV SOLN
INTRAVENOUS | Status: AC
  Administered 2018-05-05: 500 [IU]
  Filled 2018-05-05: qty 5

## 2018-05-05 MED ORDER — MIDAZOLAM HCL 2 MG/2ML IJ SOLN
INTRAMUSCULAR | Status: AC | PRN
  Administered 2018-05-05 (×2): 1 mg via INTRAVENOUS

## 2018-05-05 MED ORDER — LIDOCAINE-EPINEPHRINE (PF) 2 %-1:200000 IJ SOLN
INTRAMUSCULAR | Status: AC | PRN
  Administered 2018-05-05: 10 mL

## 2018-05-05 MED ORDER — SODIUM CHLORIDE 0.9 % IV SOLN: INTRAVENOUS | Status: DC

## 2018-05-05 MED ORDER — MIDAZOLAM HCL 2 MG/2ML IJ SOLN
INTRAMUSCULAR | Status: AC
  Filled 2018-05-05: qty 2

## 2018-05-05 NOTE — Telephone Encounter (Signed)
[place] CSW Progress Notes  Call from patient, wants to have [place] transport arranged for upcoming chemotherapy appts.  Warehouse manager.  Edwyna Shell, LCSW Clinical Social Worker Phone:  [phone]

## 2018-05-05 NOTE — Procedures (Signed)
Pre Procedure Dx: Poor venous access Post Procedural Dx: Same  Successful placement of right IJ approach port-a-cath with tip at the superior caval atrial junction. The catheter is ready for immediate use.  Estimated Blood Loss: Minimal  Complications: None immediate.  Jay Ripley Bogosian, MD Pager #: [phone]   

## 2018-05-05 NOTE — Discharge Instructions (Signed)
Implanted Port Insertion, Care After °This sheet gives you information about how to care for yourself after your procedure. Your health care provider may also give you more specific instructions. If you have problems or questions, contact your health care provider. °What can I expect after the procedure? °After your procedure, it is common to have: °· Discomfort at the port insertion site. °· Bruising on the skin over the port. This should improve over 3-4 days. ° °Follow these instructions at home: °Port care °· After your port is placed, you will get a manufacturer's information card. The card has information about your port. Keep this card with you at all times. °· Take care of the port as told by your health care provider. Ask your health care provider if you or a family member can get training for taking care of the port at home. A home health care nurse may also take care of the port. °· Make sure to remember what type of port you have. °Incision care °· Follow instructions from your health care provider about how to take care of your port insertion site. Make sure you: °? Wash your hands with soap and water before you change your bandage (dressing). If soap and water are not available, use hand sanitizer. °? Change your dressing as told by your health care provider. °? Leave stitches (sutures), skin glue, or adhesive strips in place. These skin closures may need to stay in place for 2 weeks or longer. If adhesive strip edges start to loosen and curl up, you may trim the loose edges. Do not remove adhesive strips completely unless your health care provider tells you to do that. °· Check your port insertion site every day for signs of infection. Check for: °? More redness, swelling, or pain. °? More fluid or blood. °? Warmth. °? Pus or a bad smell. °General instructions °· Do not take baths, swim, or use a hot tub until your health care provider approves. °· Do not lift anything that is heavier than 10 lb (4.5  kg) for a week, or as told by your health care provider. °· Ask your health care provider when it is okay to: °? Return to work or school. °? Resume usual physical activities or sports. °· Do not drive for 24 hours if you were given a medicine to help you relax (sedative). °· Take over-the-counter and prescription medicines only as told by your health care provider. °· Wear a medical alert bracelet in case of an emergency. This will tell any health care providers that you have a port. °· Keep all follow-up visits as told by your health care provider. This is important. °Contact a health care provider if: °· You cannot flush your port with saline as directed, or you cannot draw blood from the port. °· You have a fever or chills. °· You have more redness, swelling, or pain around your port insertion site. °· You have more fluid or blood coming from your port insertion site. °· Your port insertion site feels warm to the touch. °· You have pus or a bad smell coming from the port insertion site. °Get help right away if: °· You have chest pain or shortness of breath. °· You have bleeding from your port that you cannot control. °Summary °· Take care of the port as told by your health care provider. °· Change your dressing as told by your health care provider. °· Keep all follow-up visits as told by your health care provider. °  This information is not intended to replace advice given to you by your health care provider. Make sure you discuss any questions you have with your health care provider. °Document Released: 06/14/2013 Document Revised: 07/15/2016 Document Reviewed: 07/15/2016 °Elsevier Interactive Patient Education © 2017 [employer] °Moderate Conscious Sedation, Adult, Care After °These instructions provide you with information about caring for yourself after your procedure. Your health care provider may also give you more specific instructions. Your treatment has been planned according to current medical  practices, but problems sometimes occur. Call your health care provider if you have any problems or questions after your procedure. °What can I expect after the procedure? °After your procedure, it is common: °· To feel sleepy for several hours. °· To feel clumsy and have poor balance for several hours. °· To have poor judgment for several hours. °· To vomit if you eat too soon. ° °Follow these instructions at home: °For at least 24 hours after the procedure: ° °· Do not: °? Participate in activities where you could fall or become injured. °? Drive. °? Use heavy machinery. °? Drink alcohol. °? Take sleeping pills or medicines that cause drowsiness. °? Make important decisions or sign legal documents. °? Take care of children on your own. °· Rest. °Eating and drinking °· Follow the diet recommended by your health care provider. °· If you vomit: °? Drink water, juice, or soup when you can drink without vomiting. °? Make sure you have little or no nausea before eating solid foods. °General instructions °· Have a responsible adult stay with you until you are awake and alert. °· Take over-the-counter and prescription medicines only as told by your health care provider. °· If you smoke, do not smoke without supervision. °· Keep all follow-up visits as told by your health care provider. This is important. °Contact a health care provider if: °· You keep feeling nauseous or you keep vomiting. °· You feel light-headed. °· You develop a rash. °· You have a fever. °Get help right away if: °· You have trouble breathing. °This information is not intended to replace advice given to you by your health care provider. Make sure you discuss any questions you have with your health care provider. °Document Released: 06/14/2013 Document Revised: 01/27/2016 Document Reviewed: 12/14/2015 °Elsevier Interactive Patient Education © 2018 [employer] ° °

## 2018-05-05 NOTE — H&P (Signed)
Chief Complaint: Patient was seen in consultation today for diffuse large B-cell lymphoma  Referring Physician(s): Brunetta Genera  Supervising Physician: Sandi Mariscal  Patient Status: [facility] - Out-pt  History of Present Illness: Jay Webster is a 84 y.o. male with past medical history of arthritis, HTN, hypothyroidism, and RBBB who recently noticed an irregularly shaped mass in the left anterior perineum which was surgically biopsied. Biopsy results revealed diffuse large B-cell lymphoma. Patient has been seen by oncology and now has plans for upcoming therapy. IR consulted for Port-A-Cath placement at the request of Dr. Irene Limbo.  Patient presents to radiology for procedure today.  He has been NPO.  He does not take blood thinners.    Past Medical History:  Diagnosis Date  . Arthritis    OA AND PAIN LEFT HIP AND OTHER JOINT PAINS  . Cancer (HCC)    THYROID CANCER - HAD THYROID REMOVED  . History of kidney stones   . History of shingles    RESOLVED  . Hypertension   . Hypothyroidism   . Low back pain   . Macular degeneration of both eyes   . RBBB (right bundle branch block with left anterior fascicular block)   . Seasonal allergies   . Thyroid disease   . Uric acid renal calculus 06/20/2007   Qualifier: Diagnosis of  By: Rogue Bussing CMA, Maryann Alar      Past Surgical History:  Procedure Laterality Date  . APPENDECTOMY  1948  . COLONOSCOPY    . Elbow Radical Reduction  1973  . [place]  . MASS EXCISION N/A 03/11/2018   Procedure: EXCISION MASS OF PERINEUM;  Surgeon: Armandina Gemma, MD;  Location: [facility] ORS;  Service: General;  Laterality: N/A;  . REMOVAL OF FIRST RIB   NOV 1994   FOR COMPRESSED VEIN BETWEEN 1 ST RIB AND COLLARBONE  . Rib removed, 1st  1994  . ROTATOR CUFF REPAIR  2009  . THYROIDECTOMY  2010  . TOTAL HIP ARTHROPLASTY Left 05/31/2013   Procedure: LEFT TOTAL HIP ARTHROPLASTY ANTERIOR APPROACH;  Surgeon: Gearlean Alf, MD;   Location: [facility] ORS;  Service: Orthopedics;  Laterality: Left;    Allergies: Penicillins  Medications: Prior to Admission medications   Medication Sig Start Date End Date Taking? Authorizing Provider  acetaminophen (TYLENOL) 325 MG tablet Take 325-650 mg by mouth daily as needed for headache.   Yes [provider]  allopurinol (ZYLOPRIM) 300 MG tablet TAKE 1 TABLET (300 MG TOTAL) BY MOUTH DAILY IN THE EVENING 03/30/18  Yes Billie Ruddy, MD  benazepril (LOTENSIN) 20 MG tablet TAKE 1 TABLET (20 MG TOTAL) BY MOUTH DAILY. 03/30/18  Yes Billie Ruddy, MD  cetirizine (ZYRTEC) 10 MG tablet Take 10 mg by mouth daily as needed for allergies.   Yes [provider]  Glucosamine-Chondroitin (OSTEO BI-FLEX REGULAR STRENGTH PO) Take 1 tablet by mouth 2 (two) times daily.   Yes [provider]  levothyroxine (SYNTHROID, LEVOTHROID) 125 MCG tablet Take 1 tablet (125 mcg total) by mouth daily. 01/17/18  Yes Marletta Lor, MD  Multiple Vitamin (MULTIVITAMIN) tablet Take 1 tablet by mouth daily.   Yes [provider]  Multiple Vitamins-Minerals (PRESERVISION AREDS 2 PO) Take 1 tablet by mouth 2 (two) times daily.   Yes [provider]  Polyethyl Glycol-Propyl Glycol (SYSTANE OP) Place 1 drop into both eyes daily as needed (irritation).   Yes [provider]  sodium chloride (OCEAN) 0.65 % SOLN  nasal spray Place 1 spray into both nostrils as needed for congestion.   Yes [provider]  Aflibercept (EYLEA) 2 MG/0.05ML SOLN Inject 1 Dose into the eye every 8 (eight) weeks.     [provider]  lidocaine-prilocaine (EMLA) cream Apply to affected area once 05/04/18   Brunetta Genera, MD  ondansetron (ZOFRAN) 8 MG tablet Take 1 tablet (8 mg total) by mouth 2 (two) times daily as needed for refractory nausea / vomiting. Start on day 3 after chemotherapy. 05/04/18   Brunetta Genera, MD  predniSONE (DELTASONE) 20 MG tablet Take 3  tablets (60 mg total) by mouth daily. Take on days 1-5 of chemotherapy. 05/04/18   Brunetta Genera, MD  prochlorperazine (COMPAZINE) 10 MG tablet Take 1 tablet (10 mg total) by mouth every 6 (six) hours as needed (Nausea or vomiting). 05/04/18   Brunetta Genera, MD     Family History  Problem Relation Age of Onset  . Heart disease Mother   . Diabetes Mother   . Anemia Unknown   . Thyroid disease Unknown     Social History   Socioeconomic History  . Marital status: Widowed    Spouse name: Not on file  . Number of children: Not on file  . Years of education: Not on file  . Highest education level: Not on file  Occupational History  . Occupation: retired  Scientific laboratory technician  . Financial resource strain: Not on file  . Food insecurity:    Worry: Not on file    Inability: Not on file  . Transportation needs:    Medical: Not on file    Non-medical: Not on file  Tobacco Use  . Smoking status: Former Smoker    Types: Cigarettes    Last attempt to quit: 02/22/1979    Years since quitting: 39.2  . Smokeless tobacco: Never Used  Substance and Sexual Activity  . Alcohol use: Yes    Alcohol/week: 0.0 standard drinks  . Drug use: No  . Sexual activity: Yes  Lifestyle  . Physical activity:    Days per week: Not on file    Minutes per session: Not on file  . Stress: Not on file  Relationships  . Social connections:    Talks on phone: Not on file    Gets together: Not on file    Attends religious service: Not on file    Active member of club or organization: Not on file    Attends meetings of clubs or organizations: Not on file    Relationship status: Not on file  Other Topics Concern  . Not on file  Social History Narrative  . Not on file    Review of Systems: A 12 point ROS discussed and pertinent positives are indicated in the HPI above.  All other systems are negative.  Review of Systems  Constitutional: Negative for activity change, fatigue and fever.    Respiratory: Negative for cough and shortness of breath.   Gastrointestinal: Negative for abdominal pain, nausea and vomiting.  Genitourinary: Negative for dysuria.  Musculoskeletal: Negative for back pain.  Psychiatric/Behavioral: Negative for behavioral problems and confusion.    Vital Signs: BP 132/70 (BP Location: Left Arm)   Pulse 72   Temp (!) 97.5 F (36.4 C)   Resp 17   Ht 5\' 4"  (1.626 m)   Wt 173 lb (78.5 kg)   SpO2 98%   BMI 29.70 kg/m   Physical Exam  Constitutional: He is oriented  to person, place, and time. He appears well-developed. No distress.  Neck: Normal range of motion. Neck supple. No tracheal deviation present.  Cardiovascular: Normal rate, regular rhythm and normal heart sounds.  Pulmonary/Chest: Effort normal and breath sounds normal. No respiratory distress. He exhibits no tenderness.  Musculoskeletal: Normal range of motion.  Lymphadenopathy:    He has no cervical adenopathy.  Neurological: He is alert and oriented to person, place, and time.  Skin: Skin is warm and dry. He is not diaphoretic.  Psychiatric: He has a normal mood and affect. His behavior is normal. Judgment and thought content normal.  Nursing note and vitals reviewed.    MD Evaluation Airway: WNL Heart: WNL Abdomen: WNL Chest/ Lungs: WNL ASA  Classification: 3 Mallampati/Airway Score: One   Imaging: Nm Pet Image Initial (pi) Skull Base To Thigh  Result Date: 04/18/2018 CLINICAL DATA:  Initial treatment strategy for diffuse large B-cell lymphoma. History of thyroid cancer. EXAM: NUCLEAR MEDICINE PET SKULL BASE TO THIGH TECHNIQUE: 8.7 mCi F-18 FDG was injected intravenously. Full-ring PET imaging was performed from the skull base to thigh after the radiotracer. CT data was obtained and used for attenuation correction and anatomic localization. Fasting blood glucose: One hundred three mg/dl COMPARISON:  None. FINDINGS: Mediastinal blood pool activity: SUV max 1.5 Note is made of  slight misregistration on fused images. NECK: No hypermetabolic lymph nodes in the neck. Incidental CT findings: None. CHEST: Axillary lymph nodes measure up to 8 mm on the right with a probable SUV max 2.2, given misregistration on the fused images. No hypermetabolic mediastinal or hilar lymph nodes. No hypermetabolic pulmonary nodules. Incidental CT findings: Atherosclerotic calcification of the arterial vasculature, including coronary arteries. No pericardial or pleural effusion. Scattered pulmonary nodules measure up to 5 mm and are too small for PET resolution. ABDOMEN/PELVIS: No abnormal hypermetabolism in the liver, adrenal glands, spleen or pancreas. Aortocaval lymph nodes measure up to 7 mm (CT image 125) with an SUV max of 3.3. Bilateral external iliac lymph nodes are mildly hypermetabolic as well. Index right external iliac lymph node measures 10 mm (CT image 160) with an SUV max of 4.0. Finally, a somewhat ill-defined intermediate density lesion within internal locule of air is seen deep to the left gluteal fold, measuring 2.0 x 3.1 cm (CT image 191), with associated hypermetabolism. Incidental CT findings: Left renal stone. Atherosclerotic calcification of the arterial vasculature. No free fluid. SKELETON: No abnormal osseous hypermetabolism. Incidental CT findings: Left hip arthroplasty. Degenerative changes in the spine and sacroiliac joints. Several lucent lesions are seen in the iliac wings without associated hypermetabolism. A small lucent lesion is also seen in the lateral aspect right fifth rib. IMPRESSION: 1. Mildly hypermetabolic small to borderline enlarged abdominal and pelvic retroperitoneal lymph nodes. Probable mild hypermetabolism within subcentimeter axillary lymph nodes, with misregistration on fused images. 2. Scattered lucent lesions in the iliac wings and right fifth rib. Continued attention on follow-up exams is warranted. 3. Scattered small pulmonary nodules are nonspecific and too  small for PET resolution. Continued attention on follow-up exams is warranted. 4. Intermediate density lesion deep to the left gluteal fold, with associated hypermetabolism, which may represent a complex cyst or abscess. 5. Aortic atherosclerosis (ICD10-170.0). Coronary artery calcification. 6. Left renal stone. Electronically Signed   By: Lorin Picket M.D.   On: 04/18/2018 09:17    Labs:  CBC: Recent Labs    01/17/18 0945 02/28/18 1016 03/24/18 0901 05/05/18 0744  WBC 5.3 5.3 6.1 5.0  HGB 12.7*  13.0 12.6* 12.5*  HCT 36.7* 38.3* 37.0* 37.5*  PLT 262.0 230 252 210    COAGS: Recent Labs    05/05/18 0744  INR 1.12  APTT 41*    BMP: Recent Labs    10/25/17 1517 01/17/18 0945 02/28/18 1016 03/24/18 0901  NA 137 138 141 137  K 4.2 4.1 4.7 4.3  CL 102 104 107 106  CO2 26 28 28 26   GLUCOSE 99 93 87 98  BUN 13 14 17 14   CALCIUM 9.1 8.8 9.1 9.1  CREATININE 0.72 0.67 0.74 0.82  GFRNONAA  --   --  >60 >60  GFRAA  --   --  >60 >60    LIVER FUNCTION TESTS: Recent Labs    10/25/17 1517 01/17/18 0945 03/24/18 0901  BILITOT 0.7 0.5 0.3  AST 13 14 12*  ALT 15 14 14   ALKPHOS 83 74 89  PROT 6.9 6.8 7.0  ALBUMIN 3.8 3.7 3.4*    TUMOR MARKERS: No results for input(s): AFPTM, CEA, CA199, CHROMGRNA in the last 8760 hours.  Assessment and Plan: Patient with past medical history of HTN, hypothyroidism, RBBB presents with complaint of new diagnosis of diffuse large B-cell lymphoma with plans for upcoming chemotherapy.  He is in need of durable venous access.  Case reviewed by Dr. Pascal Lux who approves patient for procedure.  Patient presents today in their usual state of health.  He has been NPO and is not currently on blood thinners.   Risks and benefits discussed with the patient including, but not limited to bleeding, infection, damage to adjacent structures or low yield requiring additional tests.  All of the patient's questions were answered, patient is agreeable to  proceed. Consent signed and in chart.  Thank you for this interesting consult.  I greatly enjoyed meeting Dezman Granda Webster and look forward to participating in their care.  A copy of this report was sent to the requesting provider on this date.  Electronically Signed: Docia Barrier, PA 05/05/2018, 9:11 AM   I spent a total of  30 Minutes   in face to face in clinical consultation, greater than 50% of which was counseling/coordinating care for diffuse large B-cell lymphoma.

## 2018-05-06 ENCOUNTER — Other Ambulatory Visit: Payer: Self-pay | Admitting: *Deleted

## 2018-05-06 ENCOUNTER — Telehealth: Payer: Self-pay | Admitting: Hematology

## 2018-05-06 NOTE — Telephone Encounter (Signed)
Spoke with patient regarding his rides that are scheduled for 9/3 and 9/4. Told him about the text messages he will receive and what his first ride will look like. Patient said he would like to drive to the remainder of his treatments but I told him that our services are readily available at any time.

## 2018-05-10 ENCOUNTER — Inpatient Hospital Stay: Payer: Medicare PPO | Attending: Hematology

## 2018-05-10 ENCOUNTER — Inpatient Hospital Stay: Payer: Medicare PPO

## 2018-05-10 VITALS — BP 133/79 | HR 82 | Temp 97.7°F | Resp 16

## 2018-05-10 DIAGNOSIS — H3530 Unspecified macular degeneration: Secondary | ICD-10-CM | POA: Insufficient documentation

## 2018-05-10 DIAGNOSIS — Z66 Do not resuscitate: Secondary | ICD-10-CM | POA: Diagnosis not present

## 2018-05-10 DIAGNOSIS — E890 Postprocedural hypothyroidism: Secondary | ICD-10-CM | POA: Insufficient documentation

## 2018-05-10 DIAGNOSIS — Z87891 Personal history of nicotine dependence: Secondary | ICD-10-CM | POA: Insufficient documentation

## 2018-05-10 DIAGNOSIS — Z79899 Other long term (current) drug therapy: Secondary | ICD-10-CM | POA: Insufficient documentation

## 2018-05-10 DIAGNOSIS — M5136 Other intervertebral disc degeneration, lumbar region: Secondary | ICD-10-CM | POA: Insufficient documentation

## 2018-05-10 DIAGNOSIS — R918 Other nonspecific abnormal finding of lung field: Secondary | ICD-10-CM | POA: Diagnosis not present

## 2018-05-10 DIAGNOSIS — Z87442 Personal history of urinary calculi: Secondary | ICD-10-CM | POA: Insufficient documentation

## 2018-05-10 DIAGNOSIS — C8330 Diffuse large B-cell lymphoma, unspecified site: Secondary | ICD-10-CM

## 2018-05-10 DIAGNOSIS — Z5111 Encounter for antineoplastic chemotherapy: Secondary | ICD-10-CM | POA: Diagnosis not present

## 2018-05-10 DIAGNOSIS — Z85850 Personal history of malignant neoplasm of thyroid: Secondary | ICD-10-CM | POA: Insufficient documentation

## 2018-05-10 DIAGNOSIS — Z7689 Persons encountering health services in other specified circumstances: Secondary | ICD-10-CM | POA: Diagnosis not present

## 2018-05-10 DIAGNOSIS — I10 Essential (primary) hypertension: Secondary | ICD-10-CM | POA: Insufficient documentation

## 2018-05-10 DIAGNOSIS — M1990 Unspecified osteoarthritis, unspecified site: Secondary | ICD-10-CM | POA: Insufficient documentation

## 2018-05-10 LAB — CBC WITH DIFFERENTIAL/PLATELET
BASOS ABS: 0 10*3/uL (ref 0.0–0.1)
BASOS PCT: 1 %
EOS ABS: 0.1 10*3/uL (ref 0.0–0.5)
EOS PCT: 3 %
HCT: 36.8 % — ABNORMAL LOW (ref 38.4–49.9)
Hemoglobin: 12.4 g/dL — ABNORMAL LOW (ref 13.0–17.1)
Lymphocytes Relative: 31 %
Lymphs Abs: 1.4 10*3/uL (ref 0.9–3.3)
MCH: 29.8 pg (ref 27.2–33.4)
MCHC: 33.7 g/dL (ref 32.0–36.0)
MCV: 88.5 fL (ref 79.3–98.0)
Monocytes Absolute: 0.5 10*3/uL (ref 0.1–0.9)
Monocytes Relative: 10 %
Neutro Abs: 2.4 10*3/uL (ref 1.5–6.5)
Neutrophils Relative %: 55 %
Platelets: 185 10*3/uL (ref 140–400)
RBC: 4.16 MIL/uL — AB (ref 4.20–5.82)
RDW: 13.7 % (ref 11.0–14.6)
WBC: 4.3 10*3/uL (ref 4.0–10.3)

## 2018-05-10 LAB — CMP (CANCER CENTER ONLY)
ALK PHOS: 87 U/L (ref 38–126)
ALT: 17 U/L (ref 0–44)
AST: 19 U/L (ref 15–41)
Albumin: 3.4 g/dL — ABNORMAL LOW (ref 3.5–5.0)
Anion gap: 8 (ref 5–15)
BILIRUBIN TOTAL: 0.5 mg/dL (ref 0.3–1.2)
BUN: 15 mg/dL (ref 8–23)
CALCIUM: 9.1 mg/dL (ref 8.9–10.3)
CO2: 26 mmol/L (ref 22–32)
CREATININE: 0.83 mg/dL (ref 0.61–1.24)
Chloride: 106 mmol/L (ref 98–111)
GFR, Est AFR Am: >60 mL/min (ref >60)
Glucose, Bld: 102 mg/dL — ABNORMAL HIGH (ref 70–99)
Potassium: 4.4 mmol/L (ref 3.5–5.1)
Sodium: 140 mmol/L (ref 135–145)
TOTAL PROTEIN: 6.8 g/dL (ref 6.5–8.1)

## 2018-05-10 LAB — LACTATE DEHYDROGENASE: LDH: 143 U/L (ref 98–192)

## 2018-05-10 MED ORDER — ACETAMINOPHEN 325 MG PO TABS
ORAL_TABLET | ORAL | Status: AC
  Filled 2018-05-10: qty 2

## 2018-05-10 MED ORDER — SODIUM CHLORIDE 0.9 % IV SOLN
Freq: Once | INTRAVENOUS | Status: AC
  Administered 2018-05-10: 09:00:00 via INTRAVENOUS
  Filled 2018-05-10: qty 250

## 2018-05-10 MED ORDER — VINCRISTINE SULFATE CHEMO INJECTION 1 MG/ML
2.0000 mg | Freq: Once | INTRAVENOUS | Status: AC
  Administered 2018-05-10: 2 mg via INTRAVENOUS
  Filled 2018-05-10: qty 2

## 2018-05-10 MED ORDER — PREDNISONE 20 MG PO TABS
60.0000 mg | ORAL_TABLET | Freq: Once | ORAL | Status: AC
  Administered 2018-05-10: 60 mg via ORAL

## 2018-05-10 MED ORDER — DIPHENHYDRAMINE HCL 25 MG PO CAPS
50.0000 mg | ORAL_CAPSULE | Freq: Once | ORAL | Status: AC
  Administered 2018-05-10: 50 mg via ORAL

## 2018-05-10 MED ORDER — HEPARIN SOD (PORK) LOCK FLUSH 100 UNIT/ML IV SOLN
500.0000 [IU] | Freq: Once | INTRAVENOUS | Status: AC | PRN
  Administered 2018-05-10: 500 [IU]
  Filled 2018-05-10: qty 5

## 2018-05-10 MED ORDER — DEXAMETHASONE SODIUM PHOSPHATE 10 MG/ML IJ SOLN
10.0000 mg | Freq: Once | INTRAMUSCULAR | Status: AC
  Administered 2018-05-10: 10 mg via INTRAVENOUS

## 2018-05-10 MED ORDER — PALONOSETRON HCL INJECTION 0.25 MG/5ML
INTRAVENOUS | Status: AC
  Filled 2018-05-10: qty 5

## 2018-05-10 MED ORDER — SODIUM CHLORIDE 0.9 % IV SOLN
375.0000 mg/m2 | Freq: Once | INTRAVENOUS | Status: AC
  Administered 2018-05-10: 700 mg via INTRAVENOUS
  Filled 2018-05-10: qty 20

## 2018-05-10 MED ORDER — DOXORUBICIN HCL CHEMO IV INJECTION 2 MG/ML
50.0000 mg/m2 | Freq: Once | INTRAVENOUS | Status: AC
  Administered 2018-05-10: 96 mg via INTRAVENOUS
  Filled 2018-05-10: qty 48

## 2018-05-10 MED ORDER — DIPHENHYDRAMINE HCL 25 MG PO CAPS
ORAL_CAPSULE | ORAL | Status: AC
  Filled 2018-05-10: qty 2

## 2018-05-10 MED ORDER — PALONOSETRON HCL INJECTION 0.25 MG/5ML
0.2500 mg | Freq: Once | INTRAVENOUS | Status: AC
  Administered 2018-05-10: 0.25 mg via INTRAVENOUS

## 2018-05-10 MED ORDER — DEXAMETHASONE SODIUM PHOSPHATE 10 MG/ML IJ SOLN
INTRAMUSCULAR | Status: AC
  Filled 2018-05-10: qty 1

## 2018-05-10 MED ORDER — SODIUM CHLORIDE 0.9 % IV SOLN: 10.0000 mg | Freq: Once | INTRAVENOUS | Status: DC

## 2018-05-10 MED ORDER — SODIUM CHLORIDE 0.9% FLUSH
10.0000 mL | INTRAVENOUS | Status: DC | PRN
  Administered 2018-05-10: 10 mL
  Filled 2018-05-10: qty 10

## 2018-05-10 MED ORDER — SODIUM CHLORIDE 0.9 % IV SOLN
750.0000 mg/m2 | Freq: Once | INTRAVENOUS | Status: AC
  Administered 2018-05-10: 1440 mg via INTRAVENOUS
  Filled 2018-05-10: qty 72

## 2018-05-10 MED ORDER — ACETAMINOPHEN 325 MG PO TABS
650.0000 mg | ORAL_TABLET | Freq: Once | ORAL | Status: AC
  Administered 2018-05-10: 650 mg via ORAL

## 2018-05-10 NOTE — Patient Instructions (Addendum)
[facility] Discharge Instructions for Patients Receiving Chemotherapy  Today you received the following chemotherapy agents:  Adriamycin, Vincristine, Cytoxan, and Rituxan.  To help prevent nausea and vomiting after your treatment, we encourage you to take your nausea medication as directed.   If you develop nausea and vomiting that is not controlled by your nausea medication, call the clinic.   BELOW ARE SYMPTOMS THAT SHOULD BE REPORTED IMMEDIATELY:  *FEVER GREATER THAN 100.5 F  *CHILLS WITH OR WITHOUT FEVER  NAUSEA AND VOMITING THAT IS NOT CONTROLLED WITH YOUR NAUSEA MEDICATION  *UNUSUAL SHORTNESS OF BREATH  *UNUSUAL BRUISING OR BLEEDING  TENDERNESS IN MOUTH AND THROAT WITH OR WITHOUT PRESENCE OF ULCERS  *URINARY PROBLEMS  *BOWEL PROBLEMS  UNUSUAL RASH Items with * indicate a potential emergency and should be followed up as soon as possible.  Feel free to call the clinic should you have any questions or concerns. The clinic phone number is [phone].  Please show the [facility] at check-in to the Emergency Department and triage nurse.  Doxorubicin injection What is this medicine? DOXORUBICIN (dox oh ROO bi sin) is a chemotherapy drug. It is used to treat many kinds of cancer like leukemia, lymphoma, neuroblastoma, sarcoma, and Wilms' tumor. It is also used to treat bladder cancer, breast cancer, lung cancer, ovarian cancer, stomach cancer, and thyroid cancer. This medicine may be used for other purposes; ask your health care provider or pharmacist if you have questions. COMMON BRAND NAME(S): Adriamycin, Adriamycin PFS, Adriamycin RDF, Rubex What should I tell my health care provider before I take this medicine? They need to know if you have any of these conditions: -heart disease -history of low blood counts caused by a medicine -liver disease -recent or ongoing radiation therapy -an unusual or allergic reaction to doxorubicin, other  chemotherapy agents, other medicines, foods, dyes, or preservatives -pregnant or trying to get pregnant -breast-feeding How should I use this medicine? This drug is given as an infusion into a vein. It is administered in a hospital or clinic by a specially trained health care professional. If you have pain, swelling, burning or any unusual feeling around the site of your injection, tell your health care professional right away. Talk to your pediatrician regarding the use of this medicine in children. Special care may be needed. Overdosage: If you think you have taken too much of this medicine contact a poison control center or emergency room at once. NOTE: This medicine is only for you. Do not share this medicine with others. What if I miss a dose? It is important not to miss your dose. Call your doctor or health care professional if you are unable to keep an appointment. What may interact with this medicine? This medicine may interact with the following medications: -6-mercaptopurine -paclitaxel -phenytoin -St. John's Wort -trastuzumab -verapamil This list may not describe all possible interactions. Give your health care provider a list of all the medicines, herbs, non-prescription drugs, or dietary supplements you use. Also tell them if you smoke, drink alcohol, or use illegal drugs. Some items may interact with your medicine. What should I watch for while using this medicine? This drug may make you feel generally unwell. This is not uncommon, as chemotherapy can affect healthy cells as well as cancer cells. Report any side effects. Continue your course of treatment even though you feel ill unless your doctor tells you to stop. There is a maximum amount of this medicine you should receive throughout your life. The  amount depends on the medical condition being treated and your overall health. Your doctor will watch how much of this medicine you receive in your lifetime. Tell your doctor if you  have taken this medicine before. You may need blood work done while you are taking this medicine. Your urine may turn red for a few days after your dose. This is not blood. If your urine is dark or brown, call your doctor. In some cases, you may be given additional medicines to help with side effects. Follow all directions for their use. Call your doctor or health care professional for advice if you get a fever, chills or sore throat, or other symptoms of a cold or flu. Do not treat yourself. This drug decreases your body's ability to fight infections. Try to avoid being around people who are sick. This medicine may increase your risk to bruise or bleed. Call your doctor or health care professional if you notice any unusual bleeding. Talk to your doctor about your risk of cancer. You may be more at risk for certain types of cancers if you take this medicine. Do not become pregnant while taking this medicine or for 6 months after stopping it. Women should inform their doctor if they wish to become pregnant or think they might be pregnant. Men should not father a child while taking this medicine and for 6 months after stopping it. There is a potential for serious side effects to an unborn child. Talk to your health care professional or pharmacist for more information. Do not breast-feed an infant while taking this medicine. This medicine has caused ovarian failure in some women and reduced sperm counts in some men This medicine may interfere with the ability to have a child. Talk with your doctor or health care professional if you are concerned about your fertility. What side effects may I notice from receiving this medicine? Side effects that you should report to your doctor or health care professional as soon as possible: -allergic reactions like skin rash, itching or hives, swelling of the face, lips, or tongue -breathing problems -chest pain -fast or irregular heartbeat -low blood counts - this  medicine may decrease the number of white blood cells, red blood cells and platelets. You may be at increased risk for infections and bleeding. -pain, redness, or irritation at site where injected -signs of infection - fever or chills, cough, sore throat, pain or difficulty passing urine -signs of decreased platelets or bleeding - bruising, pinpoint red spots on the skin, black, tarry stools, blood in the urine -swelling of the ankles, feet, hands -tiredness -weakness Side effects that usually do not require medical attention (report to your doctor or health care professional if they continue or are bothersome): -diarrhea -hair loss -mouth sores -nail discoloration or damage -nausea -red colored urine -vomiting This list may not describe all possible side effects. Call your doctor for medical advice about side effects. You may report side effects to FDA at 1-800-FDA-1088. Where should I keep my medicine? This drug is given in a hospital or clinic and will not be stored at home. NOTE: This sheet is a summary. It may not cover all possible information. If you have questions about this medicine, talk to your doctor, pharmacist, or health care provider.  2018 Elsevier/Gold Standard (2015-10-21 11:28:51)  Vincristine injection What is this medicine? VINCRISTINE (vin KRIS teen) is a chemotherapy drug. It slows the growth of cancer cells. This medicine is used to treat many types of cancer like  Hodgkin's disease, leukemia, non-Hodgkin's lymphoma, neuroblastoma (brain cancer), rhabdomyosarcoma, and Wilms' tumor. This medicine may be used for other purposes; ask your health care provider or pharmacist if you have questions. COMMON BRAND NAME(S): Oncovin, Vincasar PFS What should I tell my health care provider before I take this medicine? They need to know if you have any of these conditions: -blood disorders -gout -infection (especially chickenpox, cold sores, or herpes) -kidney  disease -liver disease -lung disease -nervous system disease like Charcot-Marie-Tooth (CMT) -recent or ongoing radiation therapy -an unusual or allergic reaction to vincristine, other chemotherapy agents, other medicines, foods, dyes, or preservatives -pregnant or trying to get pregnant -breast-feeding How should I use this medicine? This drug is given as an infusion into a vein. It is administered in a hospital or clinic by a specially trained health care professional. If you have pain, swelling, burning, or any unusual feeling around the site of your injection, tell your health care professional right away. Talk to your pediatrician regarding the use of this medicine in children. While this drug may be prescribed for selected conditions, precautions do apply. Overdosage: If you think you have taken too much of this medicine contact a poison control center or emergency room at once. NOTE: This medicine is only for you. Do not share this medicine with others. What if I miss a dose? It is important not to miss your dose. Call your doctor or health care professional if you are unable to keep an appointment. What may interact with this medicine? Do not take this medicine with any of the following medications: -itraconazole -mibefradil -voriconazole This medicine may also interact with the following medications: -cyclosporine -erythromycin -fluconazole -ketoconazole -medicines for HIV like delavirdine, efavirenz, nevirapine -medicines for seizures like ethotoin, fosphenotoin, phenytoin -medicines to increase blood counts like filgrastim, pegfilgrastim, sargramostim -other chemotherapy drugs like cisplatin, L-asparaginase, methotrexate, mitomycin, paclitaxel -pegaspargase -vaccines -zalcitabine, ddC Talk to your doctor or health care professional before taking any of these medicines: -acetaminophen -aspirin -ibuprofen -ketoprofen -naproxen This list may not describe all possible  interactions. Give your health care provider a list of all the medicines, herbs, non-prescription drugs, or dietary supplements you use. Also tell them if you smoke, drink alcohol, or use illegal drugs. Some items may interact with your medicine. What should I watch for while using this medicine? Your condition will be monitored carefully while you are receiving this medicine. You will need important blood work done while you are taking this medicine. This drug may make you feel generally unwell. This is not uncommon, as chemotherapy can affect healthy cells as well as cancer cells. Report any side effects. Continue your course of treatment even though you feel ill unless your doctor tells you to stop. In some cases, you may be given additional medicines to help with side effects. Follow all directions for their use. Call your doctor or health care professional for advice if you get a fever, chills or sore throat, or other symptoms of a cold or flu. Do not treat yourself. Avoid taking products that contain aspirin, acetaminophen, ibuprofen, naproxen, or ketoprofen unless instructed by your doctor. These medicines may hide a fever. Do not become pregnant while taking this medicine. Women should inform their doctor if they wish to become pregnant or think they might be pregnant. There is a potential for serious side effects to an unborn child. Talk to your health care professional or pharmacist for more information. Do not breast-feed an infant while taking this medicine. Men may have  a lower sperm count while taking this medicine. Talk to your doctor if you plan to father a child. What side effects may I notice from receiving this medicine? Side effects that you should report to your doctor or health care professional as soon as possible: -allergic reactions like skin rash, itching or hives, swelling of the face, lips, or tongue -breathing problems -confusion or changes in emotions or  moods -constipation -cough -mouth sores -muscle weakness -nausea and vomiting -pain, swelling, redness or irritation at the injection site -pain, tingling, numbness in the hands or feet -problems with balance, talking, walking -seizures -stomach pain -trouble passing urine or change in the amount of urine Side effects that usually do not require medical attention (report to your doctor or health care professional if they continue or are bothersome): -diarrhea -hair loss -jaw pain -loss of appetite This list may not describe all possible side effects. Call your doctor for medical advice about side effects. You may report side effects to FDA at 1-800-FDA-1088. Where should I keep my medicine? This drug is given in a hospital or clinic and will not be stored at home. NOTE: This sheet is a summary. It may not cover all possible information. If you have questions about this medicine, talk to your doctor, pharmacist, or health care provider.  2018 Elsevier/Gold Standard (2008-05-21 17:17:13)  Cyclophosphamide injection What is this medicine? CYCLOPHOSPHAMIDE (sye kloe FOSS fa mide) is a chemotherapy drug. It slows the growth of cancer cells. This medicine is used to treat many types of cancer like lymphoma, myeloma, leukemia, breast cancer, and ovarian cancer, to name a few. This medicine may be used for other purposes; ask your health care provider or pharmacist if you have questions. COMMON BRAND NAME(S): Cytoxan, Neosar What should I tell my health care provider before I take this medicine? They need to know if you have any of these conditions: -blood disorders -history of other chemotherapy -infection -kidney disease -liver disease -recent or ongoing radiation therapy -tumors in the bone marrow -an unusual or allergic reaction to cyclophosphamide, other chemotherapy, other medicines, foods, dyes, or preservatives -pregnant or trying to get pregnant -breast-feeding How should I  use this medicine? This drug is usually given as an injection into a vein or muscle or by infusion into a vein. It is administered in a hospital or clinic by a specially trained health care professional. Talk to your pediatrician regarding the use of this medicine in children. Special care may be needed. Overdosage: If you think you have taken too much of this medicine contact a poison control center or emergency room at once. NOTE: This medicine is only for you. Do not share this medicine with others. What if I miss a dose? It is important not to miss your dose. Call your doctor or health care professional if you are unable to keep an appointment. What may interact with this medicine? This medicine may interact with the following medications: -amiodarone -amphotericin B -azathioprine -certain antiviral medicines for HIV or AIDS such as protease inhibitors (e.g., indinavir, ritonavir) and zidovudine -certain blood pressure medications such as benazepril, captopril, enalapril, fosinopril, lisinopril, moexipril, monopril, perindopril, quinapril, ramipril, trandolapril -certain cancer medications such as anthracyclines (e.g., daunorubicin, doxorubicin), busulfan, cytarabine, paclitaxel, pentostatin, tamoxifen, trastuzumab -certain diuretics such as chlorothiazide, chlorthalidone, hydrochlorothiazide, indapamide, metolazone -certain medicines that treat or prevent blood clots like warfarin -certain muscle relaxants such as succinylcholine -cyclosporine -etanercept -indomethacin -medicines to increase blood counts like filgrastim, pegfilgrastim, sargramostim -medicines used as general anesthesia -metronidazole -  natalizumab This list may not describe all possible interactions. Give your health care provider a list of all the medicines, herbs, non-prescription drugs, or dietary supplements you use. Also tell them if you smoke, drink alcohol, or use illegal drugs. Some items may interact with your  medicine. What should I watch for while using this medicine? Visit your doctor for checks on your progress. This drug may make you feel generally unwell. This is not uncommon, as chemotherapy can affect healthy cells as well as cancer cells. Report any side effects. Continue your course of treatment even though you feel ill unless your doctor tells you to stop. Drink water or other fluids as directed. Urinate often, even at night. In some cases, you may be given additional medicines to help with side effects. Follow all directions for their use. Call your doctor or health care professional for advice if you get a fever, chills or sore throat, or other symptoms of a cold or flu. Do not treat yourself. This drug decreases your body's ability to fight infections. Try to avoid being around people who are sick. This medicine may increase your risk to bruise or bleed. Call your doctor or health care professional if you notice any unusual bleeding. Be careful brushing and flossing your teeth or using a toothpick because you may get an infection or bleed more easily. If you have any dental work done, tell your dentist you are receiving this medicine. You may get drowsy or dizzy. Do not drive, use machinery, or do anything that needs mental alertness until you know how this medicine affects you. Do not become pregnant while taking this medicine or for 1 year after stopping it. Women should inform their doctor if they wish to become pregnant or think they might be pregnant. Men should not father a child while taking this medicine and for 4 months after stopping it. There is a potential for serious side effects to an unborn child. Talk to your health care professional or pharmacist for more information. Do not breast-feed an infant while taking this medicine. This medicine may interfere with the ability to have a child. This medicine has caused ovarian failure in some women. This medicine has caused reduced sperm  counts in some men. You should talk with your doctor or health care professional if you are concerned about your fertility. If you are going to have surgery, tell your doctor or health care professional that you have taken this medicine. What side effects may I notice from receiving this medicine? Side effects that you should report to your doctor or health care professional as soon as possible: -allergic reactions like skin rash, itching or hives, swelling of the face, lips, or tongue -low blood counts - this medicine may decrease the number of white blood cells, red blood cells and platelets. You may be at increased risk for infections and bleeding. -signs of infection - fever or chills, cough, sore throat, pain or difficulty passing urine -signs of decreased platelets or bleeding - bruising, pinpoint red spots on the skin, black, tarry stools, blood in the urine -signs of decreased red blood cells - unusually weak or tired, fainting spells, lightheadedness -breathing problems -dark urine -dizziness -palpitations -swelling of the ankles, feet, hands -trouble passing urine or change in the amount of urine -weight gain -yellowing of the eyes or skin Side effects that usually do not require medical attention (report to your doctor or health care professional if they continue or are bothersome): -changes in nail  or skin color -hair loss -missed menstrual periods -mouth sores -nausea, vomiting This list may not describe all possible side effects. Call your doctor for medical advice about side effects. You may report side effects to FDA at 1-800-FDA-1088. Where should I keep my medicine? This drug is given in a hospital or clinic and will not be stored at home. NOTE: This sheet is a summary. It may not cover all possible information. If you have questions about this medicine, talk to your doctor, pharmacist, or health care provider.  2018 Elsevier/Gold Standard (2012-07-08  16:22:58)  Rituximab injection What is this medicine? RITUXIMAB (ri TUX i mab) is a monoclonal antibody. It is used to treat certain types of cancer like non-Hodgkin lymphoma and chronic lymphocytic leukemia. It is also used to treat rheumatoid arthritis, granulomatosis with polyangiitis (or Wegener's granulomatosis), and microscopic polyangiitis. This medicine may be used for other purposes; ask your health care provider or pharmacist if you have questions. COMMON BRAND NAME(S): Rituxan What should I tell my health care provider before I take this medicine? They need to know if you have any of these conditions: -heart disease -infection (especially a virus infection such as hepatitis B, chickenpox, cold sores, or herpes) -immune system problems -irregular heartbeat -kidney disease -lung or breathing disease, like asthma -recently received or scheduled to receive a vaccine -an unusual or allergic reaction to rituximab, mouse proteins, other medicines, foods, dyes, or preservatives -pregnant or trying to get pregnant -breast-feeding How should I use this medicine? This medicine is for infusion into a vein. It is administered in a hospital or clinic by a specially trained health care professional. A special MedGuide will be given to you by the pharmacist with each prescription and refill. Be sure to read this information carefully each time. Talk to your pediatrician regarding the use of this medicine in children. This medicine is not approved for use in children. Overdosage: If you think you have taken too much of this medicine contact a poison control center or emergency room at once. NOTE: This medicine is only for you. Do not share this medicine with others. What if I miss a dose? It is important not to miss a dose. Call your doctor or health care professional if you are unable to keep an appointment. What may interact with this medicine? -cisplatin -other medicines for arthritis like  disease modifying antirheumatic drugs or tumor necrosis factor inhibitors -live virus vaccines This list may not describe all possible interactions. Give your health care provider a list of all the medicines, herbs, non-prescription drugs, or dietary supplements you use. Also tell them if you smoke, drink alcohol, or use illegal drugs. Some items may interact with your medicine. What should I watch for while using this medicine? Your condition will be monitored carefully while you are receiving this medicine. You may need blood work done while you are taking this medicine. This medicine can cause serious allergic reactions. To reduce your risk you may need to take medicine before treatment with this medicine. Take your medicine as directed. In some patients, this medicine may cause a serious brain infection that may cause death. If you have any problems seeing, thinking, speaking, walking, or standing, tell your doctor right away. If you cannot reach your doctor, urgently seek other source of medical care. Call your doctor or health care professional for advice if you get a fever, chills or sore throat, or other symptoms of a cold or flu. Do not treat yourself. This drug decreases  your body's ability to fight infections. Try to avoid being around people who are sick. Do not become pregnant while taking this medicine or for 12 months after stopping it. Women should inform their doctor if they wish to become pregnant or think they might be pregnant. There is a potential for serious side effects to an unborn child. Talk to your health care professional or pharmacist for more information. What side effects may I notice from receiving this medicine? Side effects that you should report to your doctor or health care professional as soon as possible: -breathing problems -chest pain -dizziness or feeling faint -fast, irregular heartbeat -low blood counts - this medicine may decrease the number of white blood  cells, red blood cells and platelets. You may be at increased risk for infections and bleeding. -mouth sores -redness, blistering, peeling or loosening of the skin, including inside the mouth (this can be added for any serious or exfoliative rash that could lead to hospitalization) -signs of infection - fever or chills, cough, sore throat, pain or difficulty passing urine -signs and symptoms of kidney injury like trouble passing urine or change in the amount of urine -signs and symptoms of liver injury like dark yellow or brown urine; general ill feeling or flu-like symptoms; light-colored stools; loss of appetite; nausea; right upper belly pain; unusually weak or tired; yellowing of the eyes or skin -stomach pain -vomiting Side effects that usually do not require medical attention (report to your doctor or health care professional if they continue or are bothersome): -headache -joint pain -muscle cramps or muscle pain This list may not describe all possible side effects. Call your doctor for medical advice about side effects. You may report side effects to FDA at 1-800-FDA-1088. Where should I keep my medicine? This drug is given in a hospital or clinic and will not be stored at home. NOTE: This sheet is a summary. It may not cover all possible information. If you have questions about this medicine, talk to your doctor, pharmacist, or health care provider.  2018 Elsevier/Gold Standard (2016-04-01 15:28:09)

## 2018-05-11 ENCOUNTER — Inpatient Hospital Stay: Payer: Medicare PPO

## 2018-05-11 ENCOUNTER — Telehealth: Payer: Self-pay | Admitting: General Practice

## 2018-05-11 NOTE — Telephone Encounter (Signed)
[facility] CSW Progress Notes  Call from patient's friend, Cristi Loron [phone]).  "I want to talk with a Education officer, museum."  Talked with patient who agreed that CSW could talk w friend re general information/questions.   Call friend who is concerned about patient's response to chemo treatment and possible needs for help at home.  Aware patient lives alone and has no family/friend support in the area.  Asks that [facility] be aware of patient's limited support in the home.   Edwyna Shell, LCSW Clinical Social Worker Phone:  [phone]

## 2018-05-12 ENCOUNTER — Inpatient Hospital Stay: Payer: Medicare PPO

## 2018-05-12 DIAGNOSIS — E890 Postprocedural hypothyroidism: Secondary | ICD-10-CM | POA: Diagnosis not present

## 2018-05-12 DIAGNOSIS — C8330 Diffuse large B-cell lymphoma, unspecified site: Secondary | ICD-10-CM | POA: Diagnosis not present

## 2018-05-12 DIAGNOSIS — Z7689 Persons encountering health services in other specified circumstances: Secondary | ICD-10-CM | POA: Diagnosis not present

## 2018-05-12 DIAGNOSIS — I10 Essential (primary) hypertension: Secondary | ICD-10-CM | POA: Diagnosis not present

## 2018-05-12 DIAGNOSIS — M5136 Other intervertebral disc degeneration, lumbar region: Secondary | ICD-10-CM | POA: Diagnosis not present

## 2018-05-12 DIAGNOSIS — Z85850 Personal history of malignant neoplasm of thyroid: Secondary | ICD-10-CM | POA: Diagnosis not present

## 2018-05-12 DIAGNOSIS — M1990 Unspecified osteoarthritis, unspecified site: Secondary | ICD-10-CM | POA: Diagnosis not present

## 2018-05-12 DIAGNOSIS — Z5111 Encounter for antineoplastic chemotherapy: Secondary | ICD-10-CM | POA: Diagnosis not present

## 2018-05-12 DIAGNOSIS — R918 Other nonspecific abnormal finding of lung field: Secondary | ICD-10-CM | POA: Diagnosis not present

## 2018-05-12 MED ORDER — PEGFILGRASTIM-CBQV 6 MG/0.6ML ~~LOC~~ SOSY
6.0000 mg | PREFILLED_SYRINGE | Freq: Once | SUBCUTANEOUS | Status: AC
  Administered 2018-05-12: 6 mg via SUBCUTANEOUS

## 2018-05-12 MED ORDER — PEGFILGRASTIM-CBQV 6 MG/0.6ML ~~LOC~~ SOSY
PREFILLED_SYRINGE | SUBCUTANEOUS | Status: AC
  Filled 2018-05-12: qty 0.6

## 2018-05-19 ENCOUNTER — Other Ambulatory Visit: Payer: Self-pay

## 2018-05-19 DIAGNOSIS — C8330 Diffuse large B-cell lymphoma, unspecified site: Secondary | ICD-10-CM

## 2018-05-19 NOTE — Progress Notes (Signed)
HEMATOLOGY/ONCOLOGY CLINIC NOTE  Date of Service: 05/20/2018  Patient Care Team: Billie Ruddy, MD as PCP - General (Family Medicine)  CHIEF COMPLAINTS/PURPOSE OF CONSULTATION:  Diffuse Large B-Cell Lymphoma  HISTORY OF PRESENTING ILLNESS:   Jay Webster is a wonderful 84 y.o. male who has been referred to [place] by surgeon Dr. Armandina Gemma for evaluation and management of Diffuse Large B-Cell Lymphoma. The pt reports that he is doing well overall.   Of note prior to the patient's visit today, pt has had Perineum biopsy completed on 03/11/18 with results revealing Diffuse Large B-Cell Lymphoma. The pt had surgery to remove his irregularly shaped mass in the left anterior portion of the perineum with Dr. Armandina Gemma.  The pt reports first noticing the mass in his left buttock about 2 months ago, after a visit with Dr. Trena Platt. The pt decided to consult his surgeon Dr. Armandina Gemma whom he previously had a thyroidectomy with. The pt notes that the spot became as large as a golf ball and did not breach the skin. The pt notes that it wasn't terribly uncomfortable to sit on. He denies feeling any differently recently than in the past  6 months to a year.   The pt notes that he has been having some continued discharge and is using Depends. He is changing his gauze twice each day.   He denies any recent medical problems. He continues to function independently at home and lives alone, as his partner of 30+ years died in the past year. He notes that he does not have any family members or friends who he would want to include in his care. He denies any difficulties functioning day to day and denies concerns for his memory.   He has been on thyroid replacement for the last 10 years following a thyroidectomy after thyroid cancer, and was treated with one dose of radioactive iodine.   The pt also notes a uric acid kidney stone history which hasn't been a problem in many years.    Most  recent lab results (02/28/18) of CBC  is as follows: all values are WNL except for HCT at 38.3.  On review of systems, pt reports left buttock surgical wound, ganglion cyst on right wrist, and denies fevers, chills, night sweats, unexpected weight loss, new fatigue, pain along the spine, leg swelling, noticing any new lumps or bumps, and any other symptoms.   On PMHx the pt denies heart problems, strokes, abdominal problems, lung problems.  On Social Hx the pt reports that he quit smoking in 1990, and used to smoke 1-2 packs of cigarettes each day before this. He denies excess alcohol being a problem, and consumes one glass of wine rarely. 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  On Family Hx the pt reports brother died of sarcoma, different brother with Type I DM, and denies other cancer or blood disorders.  Interval History:    Jay Webster returns today after his first cycle treatment of his recently diagnosed Diffuse Large B-Cell Lymphoma. The patient's last visit with [place] was on 04/29/18 and in interim he start R-CHOP on 05/10/18. He presented to the clinic today by himself.  He notes he has been up since 2am and forgot his hearing adie at home. He notes he tolerated his first cycle of R-CHOP without any side effects so far. He denies bone pain from neulasta or udyneca. He denies neuropathy or  fever. He is able to sit properly with no pain from buttock lesion. He has been told to use cocoa butter on it  Labs shows: Hg at 11.6, plt at 119K, LDH WNL, blood chemistries overall stable. He notes he did not do his prednisone dose correctly, but understands how to use it now.   He notes he is getting more forgetful at his age. He is overall doing well and is prepared for his next cycle. He notes he is still able to be functional while living on his own, but does not do yard work anymore. He keeps in touch with his niece daily for any needed assistance. He notes  he keeps a DNR on his door in case anything happens.    MEDICAL HISTORY:  Past Medical History:  Diagnosis Date  . Arthritis    OA AND PAIN LEFT HIP AND OTHER JOINT PAINS  . Cancer (HCC)    THYROID CANCER - HAD THYROID REMOVED  . History of kidney stones   . History of shingles    RESOLVED  . Hypertension   . Hypothyroidism   . Low back pain   . Macular degeneration of both eyes   . RBBB (right bundle branch block with left anterior fascicular block)   . Seasonal allergies   . Thyroid disease   . Uric acid renal calculus 06/20/2007   Qualifier: Diagnosis of  By: Rogue Bussing CMA, Maryann Alar      SURGICAL HISTORY: Past Surgical History:  Procedure Laterality Date  . APPENDECTOMY  1948  . COLONOSCOPY    . Elbow Radical Reduction  1973  . IR IMAGING GUIDED PORT INSERTION  05/05/2018  . [place]  . MASS EXCISION N/A 03/11/2018   Procedure: EXCISION MASS OF PERINEUM;  Surgeon: Armandina Gemma, MD;  Location: [facility];  Service: General;  Laterality: N/A;  . REMOVAL OF FIRST RIB   NOV 1994   FOR COMPRESSED VEIN BETWEEN 1 ST RIB AND COLLARBONE  . Rib removed, 1st  1994  . ROTATOR CUFF REPAIR  2009  . THYROIDECTOMY  2010  . TOTAL HIP ARTHROPLASTY Left 05/31/2013   Procedure: LEFT TOTAL HIP ARTHROPLASTY ANTERIOR APPROACH;  Surgeon: Gearlean Alf, MD;  Location: [facility];  Service: Orthopedics;  Laterality: Left;    SOCIAL HISTORY: Social History   Socioeconomic History  . Marital status: Widowed    Spouse name: Not on file  . Number of children: Not on file  . Years of education: Not on file  . Highest education level: Not on file  Occupational History  . Occupation: retired  Scientific laboratory technician  . Financial resource strain: Not on file  . Food insecurity:    Worry: Not on file    Inability: Not on file  . Transportation needs:    Medical: Not on file    Non-medical: Not on file  Tobacco Use  . Smoking status: Former Smoker    Types: Cigarettes    Last  attempt to quit: 02/22/1979    Years since quitting: 39.2  . Smokeless tobacco: Never Used  Substance and Sexual Activity  . Alcohol use: Yes    Alcohol/week: 0.0 standard drinks  . Drug use: No  . Sexual activity: Yes  Lifestyle  . Physical activity:    Days per week: Not on file    Minutes per session: Not on file  . Stress: Not on file  Relationships  . Social connections:    Talks on phone: Not  on file    Gets together: Not on file    Attends religious service: Not on file    Active member of club or organization: Not on file    Attends meetings of clubs or organizations: Not on file    Relationship status: Not on file  . Intimate partner violence:    Fear of current or ex partner: Not on file    Emotionally abused: Not on file    Physically abused: Not on file    Forced sexual activity: Not on file  Other Topics Concern  . Not on file  Social History Narrative  . Not on file    FAMILY HISTORY: Family History  Problem Relation Age of Onset  . Heart disease Mother   . Diabetes Mother   . Anemia Unknown   . Thyroid disease Unknown     ALLERGIES:  is allergic to penicillins.  MEDICATIONS:  Current Outpatient Medications  Medication Sig Dispense Refill  . acetaminophen (TYLENOL) 325 MG tablet Take 325-650 mg by mouth daily as needed for headache.    . Aflibercept (EYLEA) 2 MG/0.05ML SOLN Inject 1 Dose into the eye every 8 (eight) weeks.     Marland Kitchen allopurinol (ZYLOPRIM) 300 MG tablet TAKE 1 TABLET (300 MG TOTAL) BY MOUTH DAILY IN THE EVENING 90 tablet 1  . benazepril (LOTENSIN) 20 MG tablet TAKE 1 TABLET (20 MG TOTAL) BY MOUTH DAILY. 90 tablet 2  . cetirizine (ZYRTEC) 10 MG tablet Take 10 mg by mouth daily as needed for allergies.    . Glucosamine-Chondroitin (OSTEO BI-FLEX REGULAR STRENGTH PO) Take 1 tablet by mouth 2 (two) times daily.    Marland Kitchen levothyroxine (SYNTHROID, LEVOTHROID) 125 MCG tablet Take 1 tablet (125 mcg total) by mouth daily. 30 tablet 0  .  lidocaine-prilocaine (EMLA) cream Apply to affected area once 30 g 3  . Multiple Vitamin (MULTIVITAMIN) tablet Take 1 tablet by mouth daily.    . Multiple Vitamins-Minerals (PRESERVISION AREDS 2 PO) Take 1 tablet by mouth 2 (two) times daily.    . ondansetron (ZOFRAN) 8 MG tablet Take 1 tablet (8 mg total) by mouth 2 (two) times daily as needed for refractory nausea / vomiting. Start on day 3 after chemotherapy. 30 tablet 1  . Polyethyl Glycol-Propyl Glycol (SYSTANE OP) Place 1 drop into both eyes daily as needed (irritation).    . predniSONE (DELTASONE) 20 MG tablet Take 3 tablets (60 mg total) by mouth daily. Take on days 1-5 of chemotherapy. 15 tablet 6  . prochlorperazine (COMPAZINE) 10 MG tablet Take 1 tablet (10 mg total) by mouth every 6 (six) hours as needed (Nausea or vomiting). 30 tablet 6  . sodium chloride (OCEAN) 0.65 % SOLN nasal spray Place 1 spray into both nostrils as needed for congestion.     No current facility-administered medications for this visit.     REVIEW OF SYSTEMS:    A 10+ POINT REVIEW OF SYSTEMS WAS OBTAINED including neurology, dermatology, psychiatry, cardiac, respiratory, lymph, extremities, GI, GU, Musculoskeletal, constitutional, breasts, reproductive, HEENT.  All pertinent positives are noted in the HPI.  All others are negative.   PHYSICAL EXAMINATION: ECOG PERFORMANCE STATUS: 2 - Symptomatic, <50% confined to bed  . Vitals:   05/20/18 0916  BP: 131/67  Pulse: 77  Resp: 16  Temp: 97.9 F (36.6 C)  SpO2: 99%   Filed Weights   05/20/18 0916  Weight: 175 lb 12.8 oz (79.7 kg)   .Body mass index is 30.18 kg/m.  GENERAL:alert,  in no acute distress and comfortable SKIN: no acute rashes, no significant lesions EYES: conjunctiva are pink and non-injected, sclera anicteric OROPHARYNX: MMM, no exudates, no oropharyngeal erythema or ulceration NECK: supple, no JVD LYMPH:  no palpable lymphadenopathy in the cervical, axillary or inguinal  regions LUNGS: clear to auscultation b/l with normal respiratory effort HEART: regular rate & rhythm ABDOMEN:  normoactive bowel sounds , non tender, not distended. Extremity: no pedal edema PSYCH: alert & oriented x 3 with fluent speech NEURO: no focal motor/sensory deficits   LABORATORY DATA:  I have reviewed the data as listed  . CBC Latest Ref Rng & Units 05/20/2018 05/10/2018 05/05/2018  WBC 4.0 - 10.3 K/uL 7.5 4.3 5.0  Hemoglobin 13.0 - 17.1 g/dL 11.6(L) 12.4(L) 12.5(L)  Hematocrit 38.4 - 49.9 % 33.9(L) 36.8(L) 37.5(L)  Platelets 140 - 400 K/uL 119(L) 185 210   . CBC    Component Value Date/Time   WBC 7.5 05/20/2018 0818   WBC 4.3 05/10/2018 0755   RBC 3.86 (L) 05/20/2018 0818   HGB 11.6 (L) 05/20/2018 0818   HCT 33.9 (L) 05/20/2018 0818   PLT 119 (L) 05/20/2018 0818   MCV 87.8 05/20/2018 0818   MCH 30.1 05/20/2018 0818   MCHC 34.2 05/20/2018 0818   RDW 13.2 05/20/2018 0818   LYMPHSABS 1.1 05/20/2018 0818   MONOABS 0.8 05/20/2018 0818   EOSABS 0.1 05/20/2018 0818   BASOSABS 0.0 05/20/2018 0818    CMP Latest Ref Rng & Units 05/20/2018 05/10/2018 03/24/2018  Glucose 70 - 99 mg/dL 101(H) 102(H) 98  BUN 8 - 23 mg/dL _0 Creatinine 0.61 - 1.24 mg/dL 0.74 0.83 0.82  Sodium 135 - 145 mmol/L 139 140 137  Potassium 3.5 - 5.1 mmol/L 4.0 4.4 4.3  Chloride 98 - 111 mmol/L 105 106 106  CO2 22 - 32 mmol/L _1 Calcium 8.9 - 10.3 mg/dL 8.8(L) 9.1 9.1  Total Protein 6.5 - 8.1 g/dL 6.4(L) 6.8 7.0  Total Bilirubin 0.3 - 1.2 mg/dL 0.2(L) 0.5 0.3  Alkaline Phos 38 - 126 U/L 99 87 89  AST 15 - 41 U/L 12(L) 19 12(L)  ALT 0 - 44 U/L _2 . Lab Results  Component Value Date   LDH 157 05/20/2018   Component     Latest Ref Rng & Units 03/24/2018  LDH     98 - 192 U/L 185  HCV Ab     0.0 - 0.9 s/co ratio 0.2  Hep B Core Ab, Tot     Negative Negative  Hepatitis B Surface Ag     Negative Negative  HIV Screen 4th Generation wRfx     Non Reactive Non Reactive     03/11/18 Perineum Bx:    RADIOGRAPHIC STUDIES: I have personally reviewed the radiological images as listed and agreed with the findings in the report. Ir Imaging Guided Port Insertion  Result Date: 05/05/2018 INDICATION: History of thyroid cancer with new diagnosis of diffuse large B-cell lymphoma. In need of durable intravenous access for chemotherapy administration. EXAM: IMPLANTED PORT A CATH PLACEMENT WITH ULTRASOUND AND FLUOROSCOPIC GUIDANCE COMPARISON:  PET-CT - 04/18/2018 MEDICATIONS: Vancomycin 1 gm IV; The antibiotic was administered within an appropriate time interval prior to skin puncture. ANESTHESIA/SEDATION: Moderate (conscious) sedation was employed during this procedure. A total of Versed 2 mg and Fentanyl 100 mcg was administered intravenously. Moderate Sedation Time: minutes. The patient's level of consciousness and vital signs were monitored continuously by radiology nursing  throughout the procedure under my direct supervision. CONTRAST:  None FLUOROSCOPY TIME:  2 minutes, 2 seconds (41.9 mGy) COMPLICATIONS: None immediate. PROCEDURE: The procedure, risks, benefits, and alternatives were explained to the patient. Questions regarding the procedure were encouraged and answered. The patient understands and consents to the procedure. The right neck and chest were prepped with chlorhexidine in a sterile fashion, and a sterile drape was applied covering the operative field. Maximum barrier sterile technique with sterile gowns and gloves were used for the procedure. A timeout was performed prior to the initiation of the procedure. Local anesthesia was provided with 1% lidocaine with epinephrine. After creating a small venotomy incision, a micropuncture kit was utilized to access the internal jugular vein. Real-time ultrasound guidance was utilized for vascular access including the acquisition of a permanent ultrasound image documenting patency of the accessed vessel. The microwire was  utilized to measure appropriate catheter length. A subcutaneous port pocket was then created along the upper chest wall utilizing a combination of sharp and blunt dissection. The pocket was irrigated with sterile saline. A single lumen thin power injectable port was chosen for placement. The 8 Fr catheter was tunneled from the port pocket site to the venotomy incision. The port was placed in the pocket. The external catheter was trimmed to appropriate length. At the venotomy, an 8 Fr peel-away sheath was placed over a guidewire under fluoroscopic guidance. The catheter was then placed through the sheath and the sheath was removed. Final catheter positioning was confirmed and documented with a fluoroscopic spot radiograph. The port was accessed with a Huber needle, aspirated and flushed with heparinized saline. The venotomy site was closed with an interrupted 4-0 Vicryl suture. The port pocket incision was closed with interrupted 2-0 Vicryl suture and the skin was opposed with a running subcuticular 4-0 Vicryl suture. Dermabond and Steri-strips were applied to both incisions. Dressings were placed. The patient tolerated the procedure well without immediate post procedural complication. FINDINGS: After catheter placement, the tip lies within the superior cavoatrial junction. The catheter aspirates and flushes normally and is ready for immediate use. IMPRESSION: Successful placement of a right internal jugular approach power injectable Port-A-Cath. The catheter is ready for immediate use. Electronically Signed   By: Sandi Mariscal M.D.   On: 05/05/2018 12:20    ASSESSMENT & PLAN:   84 y.o. male with  1. Diffuse Large B-Cell Lymphoma -newly diagnosed presenting as a mass in the left medial gluteal region/perineum s/p resection. -03/11/18 Surgical bx which revealed Diffuse Large B-Cell Non-Hodgkin's Lymphoma  -04/13/18 ECHO revealed LV EF of 60-65%  -No Hep B, Hep C or HIV from 03/24/18 labs -04/18/18 PET/CT which  revealed Mildly hypermetabolic small to borderline enlarged abdominal and pelvic retroperitoneal lymph nodes. Probable mild hypermetabolism within subcentimeter axillary lymph nodes, with misregistration on fused images. 2. Scattered lucent lesions in the iliac wings and right fifth rib. Continued attention on follow-up exams is warranted. 3. Scattered small pulmonary nodules are nonspecific and too small for PET resolution. Continued attention on follow-up exams is warranted. 4. Intermediate density lesion deep to the left gluteal fold, with associated hypermetabolism, which may represent a complex cyst or abscess. 5. Aortic atherosclerosis. Coronary artery calcification. 6. Left renal stone. -Treatment plan include R-CHOP for 6 cycles starting 05/10/18    PLAN:  -Port a cath placed on 05/05/18, no sign of infection. He has EMLA cream for use.  -Pt notes he tolerated his first cycle R-CHOP very well with no side effects.  -I  reviewed today's labs with pt (05/20/18), Hg at 11.6, plt at 119K, LDH WNL, blood chemistries overall stable.  -No prohibitive toxicities from treatment at this point.  -Will repeat scan after cycle 3 of treatment to monitor response.  -I encouraged him to continue to drink plenty of water, eat adequately and remain active.  -Will discuss this patient's case in my tumor board. Will likely proceed with 6 cycles if he can tolerate.  -Pt clinically is not symptomatic of an abscess and the PET/CT finding is likely a cyst, pt will let me know if he develops any signs of infection -Pt notes he is doing well living alone. He has a DNR in place at home. He has good support from his niece for who he speaks with daily.  -F/u in 2 weeks for C2 R-CHOP    2.  Patient Active Problem List   Diagnosis Date Noted  . Diffuse large B-cell lymphoma ([place]) 03/30/2018  . Perineal mass, male 03/11/2018  . Perineal mass in male 03/08/2018  . Degeneration of lumbar intervertebral disc 10/05/2017  .  History of total hip arthroplasty 10/05/2017  . Combined forms of age-related cataract of both eyes 04/12/2014  . OA (osteoarthritis) of hip 05/31/2013  . History of revision of total replacement of left hip joint 05/31/2013  . High risk medication use 10/25/2012  . Vitreomacular adhesion 05/24/2012  . Posterior vitreous detachment 04/12/2012  . Cystoid macular edema 11/10/2011  . Subretinal neovascularization of macula 09/11/2011  . Retinal pigment epithelial detachment, bilateral 09/11/2011  . Macular degeneration of both eyes 06/09/2011  . History of thyroid cancer 04/21/2011  . Macular degeneration 01/05/2011  . CARCINOMA, THYROID GLAND, PAPILLARY 11/20/2008  . HYPOTHYROIDISM, POSTSURGICAL 11/20/2008  . Osteoarthritis 07/01/2007  . LOW BACK PAIN 07/01/2007  . Uric acid renal calculus 06/20/2007  . Essential hypertension 06/20/2007   -continue f/u with PCP     Please schedule C2 of R-CHOP with Neulasta as per orders on 05/31/2018 with MD visit and labs    All of the patients questions were answered with apparent satisfaction. The patient knows to call the clinic with any problems, questions or concerns.  The total time spent in the appt was 20 minutes and more than 50% was on counseling and direct patient cares.   Sullivan Lone MD MS AAHIVMS [facility] Hematology/Oncology Physician [facility]  (Office):       [phone] (Work cell):  [phone] (Fax):           [phone]  05/20/2018 10:02 AM  I, Joslyn Devon, am acting as scribe for Sullivan Lone, MD.    .I have reviewed the above documentation for accuracy and completeness, and I agree with the above.   Brunetta Genera MD

## 2018-05-20 ENCOUNTER — Inpatient Hospital Stay (HOSPITAL_BASED_OUTPATIENT_CLINIC_OR_DEPARTMENT_OTHER): Payer: Medicare PPO | Admitting: Hematology

## 2018-05-20 ENCOUNTER — Inpatient Hospital Stay: Payer: Medicare PPO

## 2018-05-20 VITALS — BP 131/67 | HR 77 | Temp 97.9°F | Resp 16 | Ht 64.0 in | Wt 175.8 lb

## 2018-05-20 DIAGNOSIS — H3530 Unspecified macular degeneration: Secondary | ICD-10-CM | POA: Diagnosis not present

## 2018-05-20 DIAGNOSIS — M1990 Unspecified osteoarthritis, unspecified site: Secondary | ICD-10-CM | POA: Diagnosis not present

## 2018-05-20 DIAGNOSIS — Z7689 Persons encountering health services in other specified circumstances: Secondary | ICD-10-CM | POA: Diagnosis not present

## 2018-05-20 DIAGNOSIS — R918 Other nonspecific abnormal finding of lung field: Secondary | ICD-10-CM

## 2018-05-20 DIAGNOSIS — Z85850 Personal history of malignant neoplasm of thyroid: Secondary | ICD-10-CM

## 2018-05-20 DIAGNOSIS — C8330 Diffuse large B-cell lymphoma, unspecified site: Secondary | ICD-10-CM | POA: Diagnosis not present

## 2018-05-20 DIAGNOSIS — M5136 Other intervertebral disc degeneration, lumbar region: Secondary | ICD-10-CM | POA: Diagnosis not present

## 2018-05-20 DIAGNOSIS — Z79899 Other long term (current) drug therapy: Secondary | ICD-10-CM | POA: Diagnosis not present

## 2018-05-20 DIAGNOSIS — I10 Essential (primary) hypertension: Secondary | ICD-10-CM

## 2018-05-20 DIAGNOSIS — Z66 Do not resuscitate: Secondary | ICD-10-CM

## 2018-05-20 DIAGNOSIS — E890 Postprocedural hypothyroidism: Secondary | ICD-10-CM | POA: Diagnosis not present

## 2018-05-20 DIAGNOSIS — Z87442 Personal history of urinary calculi: Secondary | ICD-10-CM

## 2018-05-20 DIAGNOSIS — Z5111 Encounter for antineoplastic chemotherapy: Secondary | ICD-10-CM | POA: Diagnosis not present

## 2018-05-20 DIAGNOSIS — Z87891 Personal history of nicotine dependence: Secondary | ICD-10-CM | POA: Diagnosis not present

## 2018-05-20 LAB — CMP (CANCER CENTER ONLY)
ALK PHOS: 99 U/L (ref 38–126)
ALT: 17 U/L (ref 0–44)
ANION GAP: 7 (ref 5–15)
AST: 12 U/L — ABNORMAL LOW (ref 15–41)
Albumin: 3.3 g/dL — ABNORMAL LOW (ref 3.5–5.0)
BILIRUBIN TOTAL: 0.2 mg/dL — AB (ref 0.3–1.2)
BUN: 13 mg/dL (ref 8–23)
CALCIUM: 8.8 mg/dL — AB (ref 8.9–10.3)
CO2: 27 mmol/L (ref 22–32)
Chloride: 105 mmol/L (ref 98–111)
Creatinine: 0.74 mg/dL (ref 0.61–1.24)
GFR, Est AFR Am: >60 mL/min (ref >60)
GLUCOSE: 101 mg/dL — AB (ref 70–99)
POTASSIUM: 4 mmol/L (ref 3.5–5.1)
Sodium: 139 mmol/L (ref 135–145)
TOTAL PROTEIN: 6.4 g/dL — AB (ref 6.5–8.1)

## 2018-05-20 LAB — CBC WITH DIFFERENTIAL (CANCER CENTER ONLY)
BASOS ABS: 0 10*3/uL (ref 0.0–0.1)
BASOS PCT: 0 %
Eosinophils Absolute: 0.1 10*3/uL (ref 0.0–0.5)
Eosinophils Relative: 1 %
HEMATOCRIT: 33.9 % — AB (ref 38.4–49.9)
Hemoglobin: 11.6 g/dL — ABNORMAL LOW (ref 13.0–17.1)
LYMPHS PCT: 14 %
Lymphs Abs: 1.1 10*3/uL (ref 0.9–3.3)
MCH: 30.1 pg (ref 27.2–33.4)
MCHC: 34.2 g/dL (ref 32.0–36.0)
MCV: 87.8 fL (ref 79.3–98.0)
MONO ABS: 0.8 10*3/uL (ref 0.1–0.9)
Monocytes Relative: 11 %
NEUTROS ABS: 5.5 10*3/uL (ref 1.5–6.5)
Neutrophils Relative %: 74 %
PLATELETS: 119 10*3/uL — AB (ref 140–400)
RBC: 3.86 MIL/uL — AB (ref 4.20–5.82)
RDW: 13.2 % (ref 11.0–14.6)
WBC: 7.5 10*3/uL (ref 4.0–10.3)

## 2018-05-20 LAB — LACTATE DEHYDROGENASE: LDH: 157 U/L (ref 98–192)

## 2018-05-30 ENCOUNTER — Other Ambulatory Visit: Payer: Self-pay

## 2018-05-30 ENCOUNTER — Encounter: Payer: Medicare PPO | Admitting: Family Medicine

## 2018-05-30 DIAGNOSIS — C8330 Diffuse large B-cell lymphoma, unspecified site: Secondary | ICD-10-CM

## 2018-05-30 NOTE — Progress Notes (Signed)
HEMATOLOGY/ONCOLOGY CLINIC NOTE  Date of Service: 05/31/2018  Patient Care Team: Billie Ruddy, MD as PCP - General (Family Medicine)  CHIEF COMPLAINTS/PURPOSE OF CONSULTATION:  Diffuse Large B-Cell Lymphoma  HISTORY OF PRESENTING ILLNESS:   Jay Webster is a wonderful 84 y.o. male who has been referred to [place] by surgeon Dr. Armandina Gemma for evaluation and management of Diffuse Large B-Cell Lymphoma. The pt reports that he is doing well overall.   Of note prior to the patient's visit today, pt has had Perineum biopsy completed on 03/11/18 with results revealing Diffuse Large B-Cell Lymphoma. The pt had surgery to remove his irregularly shaped mass in the left anterior portion of the perineum with Dr. Armandina Gemma.  The pt reports first noticing the mass in his left buttock about 2 months ago, after a visit with Dr. Trena Platt. The pt decided to consult his surgeon Dr. Armandina Gemma whom he previously had a thyroidectomy with. The pt notes that the spot became as large as a golf ball and did not breach the skin. The pt notes that it wasn't terribly uncomfortable to sit on. He denies feeling any differently recently than in the past  6 months to a year.   The pt notes that he has been having some continued discharge and is using Depends. He is changing his gauze twice each day.   He denies any recent medical problems. He continues to function independently at home and lives alone, as his partner of 30+ years died in the past year. He notes that he does not have any family members or friends who he would want to include in his care. He denies any difficulties functioning day to day and denies concerns for his memory.   He has been on thyroid replacement for the last 10 years following a thyroidectomy after thyroid cancer, and was treated with one dose of radioactive iodine.   The pt also notes a uric acid kidney stone history which hasn't been a problem in many years.    Most  recent lab results (02/28/18) of CBC  is as follows: all values are WNL except for HCT at 38.3.  On review of systems, pt reports left buttock surgical wound, ganglion cyst on right wrist, and denies fevers, chills, night sweats, unexpected weight loss, new fatigue, pain along the spine, leg swelling, noticing any new lumps or bumps, and any other symptoms.   On PMHx the pt denies heart problems, strokes, abdominal problems, lung problems.  On Social Hx the pt reports that he quit smoking in 1990, and used to smoke 1-2 packs of cigarettes each day before this. He denies excess alcohol being a problem, and consumes one glass of wine rarely. 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  On Family Hx the pt reports brother died of sarcoma, different brother with Type I DM, and denies other cancer or blood disorders.  Interval History:    Jay Webster returns today for management, evaluation, and C2 R-CHOP treatment of his recently diagnosed Diffuse Large B-Cell Lymphoma. The patient's last visit with [place] was on 05/20/18. The pt reports that he is doing well overall.   The pt reports that he has not had any new concerns in the interim. He denies any concerns for infections and denies nausea, vomiting, and diarrhea.   He notes that his surgical site has completely healed and he continues to apply cocoa butter each day.  He denies discomfort, discharge, or ulceration.   The pt notes that he has not had any problems with his port as well.   The pt also notes that he continues to function well at home.   Lab results today (05/30/18) of CBC w/diff, CMP is as follows: all values are WNL except for RBC at 3.86, HGB at 11.6, HCT at 34.2, Glucose at 100, Albumin at 3.3, AST at 14, Total Bilirubin at 0.2.  On review of systems, pt reports stable energy levels, moving his bowels well, and denies vomiting, diarrhea, nausea, fevers, chills, night sweats, discomfort at  surgical site, discharge from site, ulceration at site, leg swelling, pain along the spine, abdominal pains, mouth sores, discomfort passing urine, and any other symptoms.    MEDICAL HISTORY:  Past Medical History:  Diagnosis Date  . Arthritis    OA AND PAIN LEFT HIP AND OTHER JOINT PAINS  . Cancer (HCC)    THYROID CANCER - HAD THYROID REMOVED  . History of kidney stones   . History of shingles    RESOLVED  . Hypertension   . Hypothyroidism   . Low back pain   . Macular degeneration of both eyes   . RBBB (right bundle branch block with left anterior fascicular block)   . Seasonal allergies   . Thyroid disease   . Uric acid renal calculus 06/20/2007   Qualifier: Diagnosis of  By: Rogue Bussing CMA, Maryann Alar      SURGICAL HISTORY: Past Surgical History:  Procedure Laterality Date  . APPENDECTOMY  1948  . COLONOSCOPY    . Elbow Radical Reduction  1973  . IR IMAGING GUIDED PORT INSERTION  05/05/2018  . [place]  . MASS EXCISION N/A 03/11/2018   Procedure: EXCISION MASS OF PERINEUM;  Surgeon: Armandina Gemma, MD;  Location: [facility];  Service: General;  Laterality: N/A;  . REMOVAL OF FIRST RIB   NOV 1994   FOR COMPRESSED VEIN BETWEEN 1 ST RIB AND COLLARBONE  . Rib removed, 1st  1994  . ROTATOR CUFF REPAIR  2009  . THYROIDECTOMY  2010  . TOTAL HIP ARTHROPLASTY Left 05/31/2013   Procedure: LEFT TOTAL HIP ARTHROPLASTY ANTERIOR APPROACH;  Surgeon: Gearlean Alf, MD;  Location: [facility];  Service: Orthopedics;  Laterality: Left;    SOCIAL HISTORY: Social History   Socioeconomic History  . Marital status: Widowed    Spouse name: Not on file  . Number of children: Not on file  . Years of education: Not on file  . Highest education level: Not on file  Occupational History  . Occupation: retired  Scientific laboratory technician  . Financial resource strain: Not on file  . Food insecurity:    Worry: Not on file    Inability: Not on file  . Transportation needs:    Medical: Not  on file    Non-medical: Not on file  Tobacco Use  . Smoking status: Former Smoker    Types: Cigarettes    Last attempt to quit: 02/22/1979    Years since quitting: 39.2  . Smokeless tobacco: Never Used  Substance and Sexual Activity  . Alcohol use: Yes    Alcohol/week: 0.0 standard drinks  . Drug use: No  . Sexual activity: Yes  Lifestyle  . Physical activity:    Days per week: Not on file    Minutes per session: Not on file  . Stress: Not on file  Relationships  . Social connections:    Talks  on phone: Not on file    Gets together: Not on file    Attends religious service: Not on file    Active member of club or organization: Not on file    Attends meetings of clubs or organizations: Not on file    Relationship status: Not on file  . Intimate partner violence:    Fear of current or ex partner: Not on file    Emotionally abused: Not on file    Physically abused: Not on file    Forced sexual activity: Not on file  Other Topics Concern  . Not on file  Social History Narrative  . Not on file    FAMILY HISTORY: Family History  Problem Relation Age of Onset  . Heart disease Mother   . Diabetes Mother   . Anemia Unknown   . Thyroid disease Unknown     ALLERGIES:  is allergic to penicillins.  MEDICATIONS:  Current Outpatient Medications  Medication Sig Dispense Refill  . acetaminophen (TYLENOL) 325 MG tablet Take 325-650 mg by mouth daily as needed for headache.    . Aflibercept (EYLEA) 2 MG/0.05ML SOLN Inject 1 Dose into the eye every 8 (eight) weeks.     Marland Kitchen allopurinol (ZYLOPRIM) 300 MG tablet TAKE 1 TABLET (300 MG TOTAL) BY MOUTH DAILY IN THE EVENING 90 tablet 1  . benazepril (LOTENSIN) 20 MG tablet TAKE 1 TABLET (20 MG TOTAL) BY MOUTH DAILY. 90 tablet 2  . cetirizine (ZYRTEC) 10 MG tablet Take 10 mg by mouth daily as needed for allergies.    . Glucosamine-Chondroitin (OSTEO BI-FLEX REGULAR STRENGTH PO) Take 1 tablet by mouth 2 (two) times daily.    Marland Kitchen levothyroxine  (SYNTHROID, LEVOTHROID) 125 MCG tablet Take 1 tablet (125 mcg total) by mouth daily. 30 tablet 0  . lidocaine-prilocaine (EMLA) cream Apply to affected area once 30 g 3  . Multiple Vitamin (MULTIVITAMIN) tablet Take 1 tablet by mouth daily.    . Multiple Vitamins-Minerals (PRESERVISION AREDS 2 PO) Take 1 tablet by mouth 2 (two) times daily.    . ondansetron (ZOFRAN) 8 MG tablet Take 1 tablet (8 mg total) by mouth 2 (two) times daily as needed for refractory nausea / vomiting. Start on day 3 after chemotherapy. 30 tablet 1  . Polyethyl Glycol-Propyl Glycol (SYSTANE OP) Place 1 drop into both eyes daily as needed (irritation).    . predniSONE (DELTASONE) 20 MG tablet Take 3 tablets (60 mg total) by mouth daily. Take on days 1-5 of chemotherapy. 15 tablet 6  . sodium chloride (OCEAN) 0.65 % SOLN nasal spray Place 1 spray into both nostrils as needed for congestion.    . prochlorperazine (COMPAZINE) 10 MG tablet Take 1 tablet (10 mg total) by mouth every 6 (six) hours as needed (Nausea or vomiting). (Patient not taking: Reported on 05/31/2018) 30 tablet 6   No current facility-administered medications for this visit.     REVIEW OF SYSTEMS:    A 10+ POINT REVIEW OF SYSTEMS WAS OBTAINED including neurology, dermatology, psychiatry, cardiac, respiratory, lymph, extremities, GI, GU, Musculoskeletal, constitutional, breasts, reproductive, HEENT.  All pertinent positives are noted in the HPI.  All others are negative.   PHYSICAL EXAMINATION: ECOG PERFORMANCE STATUS: 2 - Symptomatic, <50% confined to bed  Vitals:   05/31/18 0920  BP: 112/65  Pulse: 75  Resp: 14  Temp: 97.7 F (36.5 C)  SpO2: 99%   Filed Weights   05/31/18 0920  Weight: 175 lb 1.6 oz (79.4 kg)   .  Body mass index is 30.06 kg/m.  GENERAL:alert, in no acute distress and comfortable SKIN: no acute rashes, no significant lesions EYES: conjunctiva are pink and non-injected, sclera anicteric OROPHARYNX: MMM, no exudates, no  oropharyngeal erythema or ulceration NECK: supple, no JVD LYMPH:  no palpable lymphadenopathy in the cervical, axillary or inguinal regions LUNGS: clear to auscultation b/l with normal respiratory effort HEART: regular rate & rhythm ABDOMEN:  normoactive bowel sounds , non tender, not distended. No palpable hepatosplenomegaly.  Extremity: no pedal edema PSYCH: alert & oriented x 3 with fluent speech NEURO: no focal motor/sensory deficits   LABORATORY DATA:  I have reviewed the data as listed  . CBC Latest Ref Rng & Units 05/31/2018 05/20/2018 05/10/2018  WBC 4.0 - 10.3 K/uL 7.9 7.5 4.3  Hemoglobin 13.0 - 17.1 g/dL 11.6(L) 11.6(L) 12.4(L)  Hematocrit 38.4 - 49.9 % 34.2(L) 33.9(L) 36.8(L)  Platelets 140 - 400 K/uL 334 119(L) 185   . CBC    Component Value Date/Time   WBC 7.9 05/31/2018 0855   WBC 4.3 05/10/2018 0755   RBC 3.86 (L) 05/31/2018 0855   HGB 11.6 (L) 05/31/2018 0855   HCT 34.2 (L) 05/31/2018 0855   PLT 334 05/31/2018 0855   MCV 88.6 05/31/2018 0855   MCH 30.1 05/31/2018 0855   MCHC 33.9 05/31/2018 0855   RDW 14.2 05/31/2018 0855   LYMPHSABS 1.1 05/31/2018 0855   MONOABS 0.6 05/31/2018 0855   EOSABS 0.0 05/31/2018 0855   BASOSABS 0.1 05/31/2018 0855    CMP Latest Ref Rng & Units 05/31/2018 05/20/2018 05/10/2018  Glucose 70 - 99 mg/dL 100(H) 101(H) 102(H)  BUN 8 - 23 mg/dL '18 13 15  ' Creatinine 0.61 - 1.24 mg/dL 0.78 0.74 0.83  Sodium 135 - 145 mmol/L 138 139 140  Potassium 3.5 - 5.1 mmol/L 4.2 4.0 4.4  Chloride 98 - 111 mmol/L 108 105 106  CO2 22 - 32 mmol/L '23 27 26  ' Calcium 8.9 - 10.3 mg/dL 9.0 8.8(L) 9.1  Total Protein 6.5 - 8.1 g/dL 6.8 6.4(L) 6.8  Total Bilirubin 0.3 - 1.2 mg/dL 0.2(L) 0.2(L) 0.5  Alkaline Phos 38 - 126 U/L 86 99 87  AST 15 - 41 U/L 14(L) 12(L) 19  ALT 0 - 44 U/L '16 17 17   ' . Lab Results  Component Value Date   LDH 157 05/20/2018   Component     Latest Ref Rng & Units 03/24/2018  LDH     98 - 192 U/L 185  HCV Ab     0.0 - 0.9 s/co  ratio 0.2  Hep B Core Ab, Tot     Negative Negative  Hepatitis B Surface Ag     Negative Negative  HIV Screen 4th Generation wRfx     Non Reactive Non Reactive    03/11/18 Perineum Bx:    RADIOGRAPHIC STUDIES: I have personally reviewed the radiological images as listed and agreed with the findings in the report. Ir Imaging Guided Port Insertion  Result Date: 05/05/2018 INDICATION: History of thyroid cancer with new diagnosis of diffuse large B-cell lymphoma. In need of durable intravenous access for chemotherapy administration. EXAM: IMPLANTED PORT A CATH PLACEMENT WITH ULTRASOUND AND FLUOROSCOPIC GUIDANCE COMPARISON:  PET-CT - 04/18/2018 MEDICATIONS: Vancomycin 1 gm IV; The antibiotic was administered within an appropriate time interval prior to skin puncture. ANESTHESIA/SEDATION: Moderate (conscious) sedation was employed during this procedure. A total of Versed 2 mg and Fentanyl 100 mcg was administered intravenously. Moderate Sedation Time: minutes. The patient's level  of consciousness and vital signs were monitored continuously by radiology nursing throughout the procedure under my direct supervision. CONTRAST:  None FLUOROSCOPY TIME:  2 minutes, 2 seconds (21.1 mGy) COMPLICATIONS: None immediate. PROCEDURE: The procedure, risks, benefits, and alternatives were explained to the patient. Questions regarding the procedure were encouraged and answered. The patient understands and consents to the procedure. The right neck and chest were prepped with chlorhexidine in a sterile fashion, and a sterile drape was applied covering the operative field. Maximum barrier sterile technique with sterile gowns and gloves were used for the procedure. A timeout was performed prior to the initiation of the procedure. Local anesthesia was provided with 1% lidocaine with epinephrine. After creating a small venotomy incision, a micropuncture kit was utilized to access the internal jugular vein. Real-time ultrasound  guidance was utilized for vascular access including the acquisition of a permanent ultrasound image documenting patency of the accessed vessel. The microwire was utilized to measure appropriate catheter length. A subcutaneous port pocket was then created along the upper chest wall utilizing a combination of sharp and blunt dissection. The pocket was irrigated with sterile saline. A single lumen thin power injectable port was chosen for placement. The 8 Fr catheter was tunneled from the port pocket site to the venotomy incision. The port was placed in the pocket. The external catheter was trimmed to appropriate length. At the venotomy, an 8 Fr peel-away sheath was placed over a guidewire under fluoroscopic guidance. The catheter was then placed through the sheath and the sheath was removed. Final catheter positioning was confirmed and documented with a fluoroscopic spot radiograph. The port was accessed with a Huber needle, aspirated and flushed with heparinized saline. The venotomy site was closed with an interrupted 4-0 Vicryl suture. The port pocket incision was closed with interrupted 2-0 Vicryl suture and the skin was opposed with a running subcuticular 4-0 Vicryl suture. Dermabond and Steri-strips were applied to both incisions. Dressings were placed. The patient tolerated the procedure well without immediate post procedural complication. FINDINGS: After catheter placement, the tip lies within the superior cavoatrial junction. The catheter aspirates and flushes normally and is ready for immediate use. IMPRESSION: Successful placement of a right internal jugular approach power injectable Port-A-Cath. The catheter is ready for immediate use. Electronically Signed   By: Sandi Mariscal M.D.   On: 05/05/2018 12:20    ASSESSMENT & PLAN:   84 y.o. male with  1. Diffuse Large B-Cell Lymphoma -newly diagnosed presenting as a mass in the left medial gluteal region/perineum s/p resection. -03/11/18 Surgical bx which  revealed Diffuse Large B-Cell Non-Hodgkin's Lymphoma  -04/13/18 ECHO revealed LV EF of 60-65%  -No Hep B, Hep C or HIV from 03/24/18 labs -04/18/18 PET/CT which revealed Mildly hypermetabolic small to borderline enlarged abdominal and pelvic retroperitoneal lymph nodes. Probable mild hypermetabolism within subcentimeter axillary lymph nodes, with misregistration on fused images. 2. Scattered lucent lesions in the iliac wings and right fifth rib. Continued attention on follow-up exams is warranted. 3. Scattered small pulmonary nodules are nonspecific and too small for PET resolution. Continued attention on follow-up exams is warranted. 4. Intermediate density lesion deep to the left gluteal fold, with associated hypermetabolism, which may represent a complex cyst or abscess. 5. Aortic atherosclerosis. Coronary artery calcification. 6. Left renal stone. -Treatment plan include R-CHOP for 6 cycles starting 05/10/18   PLAN:  -Discussed pt labwork today, 05/30/18; blood counts and chemistries are stable  -The pt has no prohibitive toxicities from continuing C2 of R-CHOP  with G-CSF support at this time.  Chemotherapy orders placed, reviewed and signed. -Will see the pt back in 3 weeks  -Pt notes he tolerated his first cycle R-CHOP very well with no side effects.  -plan to rpt PET/CT after completion of 3 cycles of R-CHOP -Pt notes he is doing well living alone. He has a DNR in place at home. He has good support from his niece for who he speaks with daily.    2.  Patient Active Problem List   Diagnosis Date Noted  . Diffuse large B-cell lymphoma ([place]) 03/30/2018  . Perineal mass, male 03/11/2018  . Perineal mass in male 03/08/2018  . Degeneration of lumbar intervertebral disc 10/05/2017  . History of total hip arthroplasty 10/05/2017  . Combined forms of age-related cataract of both eyes 04/12/2014  . OA (osteoarthritis) of hip 05/31/2013  . History of revision of total replacement of left hip joint  05/31/2013  . High risk medication use 10/25/2012  . Vitreomacular adhesion 05/24/2012  . Posterior vitreous detachment 04/12/2012  . Cystoid macular edema 11/10/2011  . Subretinal neovascularization of macula 09/11/2011  . Retinal pigment epithelial detachment, bilateral 09/11/2011  . Macular degeneration of both eyes 06/09/2011  . History of thyroid cancer 04/21/2011  . Macular degeneration 01/05/2011  . CARCINOMA, THYROID GLAND, PAPILLARY 11/20/2008  . HYPOTHYROIDISM, POSTSURGICAL 11/20/2008  . Osteoarthritis 07/01/2007  . LOW BACK PAIN 07/01/2007  . Uric acid renal calculus 06/20/2007  . Essential hypertension 06/20/2007   -continue f/u with PCP   Please schedule C3 of R CHOP with neulasta as ordered in 3 weeks with labs and MD visit     All of the patients questions were answered with apparent satisfaction. The patient knows to call the clinic with any problems, questions or concerns.  The total time spent in the appt was 25 minutes and more than 50% was on counseling and direct patient cares.    Sullivan Lone MD MS AAHIVMS [facility] Hematology/Oncology Physician [facility]  (Office):       [phone] (Work cell):  [phone] (Fax):           [phone]  05/31/2018 10:01 AM  I, Baldwin Jamaica, am acting as a scribe for Dr. Irene Limbo  .I have reviewed the above documentation for accuracy and completeness, and I agree with the above. Brunetta Genera MD

## 2018-05-31 ENCOUNTER — Inpatient Hospital Stay (HOSPITAL_BASED_OUTPATIENT_CLINIC_OR_DEPARTMENT_OTHER): Payer: Medicare PPO | Admitting: Hematology

## 2018-05-31 ENCOUNTER — Inpatient Hospital Stay: Payer: Medicare PPO

## 2018-05-31 ENCOUNTER — Encounter: Payer: Self-pay | Admitting: Hematology

## 2018-05-31 ENCOUNTER — Telehealth: Payer: Self-pay

## 2018-05-31 VITALS — BP 112/65 | HR 75 | Temp 97.7°F | Resp 14 | Ht 64.0 in | Wt 175.1 lb

## 2018-05-31 VITALS — BP 111/59 | HR 79 | Temp 97.8°F | Resp 16

## 2018-05-31 DIAGNOSIS — Z87442 Personal history of urinary calculi: Secondary | ICD-10-CM

## 2018-05-31 DIAGNOSIS — M5136 Other intervertebral disc degeneration, lumbar region: Secondary | ICD-10-CM

## 2018-05-31 DIAGNOSIS — H3530 Unspecified macular degeneration: Secondary | ICD-10-CM

## 2018-05-31 DIAGNOSIS — C8330 Diffuse large B-cell lymphoma, unspecified site: Secondary | ICD-10-CM

## 2018-05-31 DIAGNOSIS — Z66 Do not resuscitate: Secondary | ICD-10-CM

## 2018-05-31 DIAGNOSIS — Z5111 Encounter for antineoplastic chemotherapy: Secondary | ICD-10-CM

## 2018-05-31 DIAGNOSIS — E890 Postprocedural hypothyroidism: Secondary | ICD-10-CM | POA: Diagnosis not present

## 2018-05-31 DIAGNOSIS — R918 Other nonspecific abnormal finding of lung field: Secondary | ICD-10-CM

## 2018-05-31 DIAGNOSIS — Z87891 Personal history of nicotine dependence: Secondary | ICD-10-CM

## 2018-05-31 DIAGNOSIS — Z85850 Personal history of malignant neoplasm of thyroid: Secondary | ICD-10-CM | POA: Diagnosis not present

## 2018-05-31 DIAGNOSIS — Z95828 Presence of other vascular implants and grafts: Secondary | ICD-10-CM

## 2018-05-31 DIAGNOSIS — Z7689 Persons encountering health services in other specified circumstances: Secondary | ICD-10-CM | POA: Diagnosis not present

## 2018-05-31 DIAGNOSIS — M1990 Unspecified osteoarthritis, unspecified site: Secondary | ICD-10-CM | POA: Diagnosis not present

## 2018-05-31 DIAGNOSIS — I10 Essential (primary) hypertension: Secondary | ICD-10-CM | POA: Diagnosis not present

## 2018-05-31 DIAGNOSIS — Z79899 Other long term (current) drug therapy: Secondary | ICD-10-CM

## 2018-05-31 LAB — CMP (CANCER CENTER ONLY)
ALBUMIN: 3.3 g/dL — AB (ref 3.5–5.0)
ALT: 16 U/L (ref 0–44)
ANION GAP: 7 (ref 5–15)
AST: 14 U/L — ABNORMAL LOW (ref 15–41)
Alkaline Phosphatase: 86 U/L (ref 38–126)
BUN: 18 mg/dL (ref 8–23)
CO2: 23 mmol/L (ref 22–32)
Calcium: 9 mg/dL (ref 8.9–10.3)
Chloride: 108 mmol/L (ref 98–111)
Creatinine: 0.78 mg/dL (ref 0.61–1.24)
GFR, Est AFR Am: >60 mL/min (ref >60)
GFR, Estimated: >60 mL/min (ref >60)
Glucose, Bld: 100 mg/dL — ABNORMAL HIGH (ref 70–99)
POTASSIUM: 4.2 mmol/L (ref 3.5–5.1)
SODIUM: 138 mmol/L (ref 135–145)
Total Bilirubin: 0.2 mg/dL — ABNORMAL LOW (ref 0.3–1.2)
Total Protein: 6.8 g/dL (ref 6.5–8.1)

## 2018-05-31 LAB — CBC WITH DIFFERENTIAL (CANCER CENTER ONLY)
Basophils Absolute: 0.1 10*3/uL (ref 0.0–0.1)
Basophils Relative: 1 %
Eosinophils Absolute: 0 10*3/uL (ref 0.0–0.5)
Eosinophils Relative: 1 %
HEMATOCRIT: 34.2 % — AB (ref 38.4–49.9)
Hemoglobin: 11.6 g/dL — ABNORMAL LOW (ref 13.0–17.1)
LYMPHS PCT: 14 %
Lymphs Abs: 1.1 10*3/uL (ref 0.9–3.3)
MCH: 30.1 pg (ref 27.2–33.4)
MCHC: 33.9 g/dL (ref 32.0–36.0)
MCV: 88.6 fL (ref 79.3–98.0)
MONO ABS: 0.6 10*3/uL (ref 0.1–0.9)
MONOS PCT: 8 %
NEUTROS ABS: 6 10*3/uL (ref 1.5–6.5)
Neutrophils Relative %: 76 %
Platelet Count: 334 10*3/uL (ref 140–400)
RBC: 3.86 MIL/uL — ABNORMAL LOW (ref 4.20–5.82)
RDW: 14.2 % (ref 11.0–14.6)
WBC Count: 7.9 10*3/uL (ref 4.0–10.3)

## 2018-05-31 MED ORDER — DIPHENHYDRAMINE HCL 25 MG PO CAPS
ORAL_CAPSULE | ORAL | Status: AC
  Filled 2018-05-31: qty 2

## 2018-05-31 MED ORDER — DEXAMETHASONE SODIUM PHOSPHATE 10 MG/ML IJ SOLN
INTRAMUSCULAR | Status: AC
  Filled 2018-05-31: qty 1

## 2018-05-31 MED ORDER — DIPHENHYDRAMINE HCL 25 MG PO CAPS
50.0000 mg | ORAL_CAPSULE | Freq: Once | ORAL | Status: AC
  Administered 2018-05-31: 50 mg via ORAL

## 2018-05-31 MED ORDER — ACETAMINOPHEN 325 MG PO TABS
650.0000 mg | ORAL_TABLET | Freq: Once | ORAL | Status: AC
  Administered 2018-05-31: 650 mg via ORAL

## 2018-05-31 MED ORDER — SODIUM CHLORIDE 0.9 % IV SOLN
Freq: Once | INTRAVENOUS | Status: AC
  Administered 2018-05-31: 10:00:00 via INTRAVENOUS
  Filled 2018-05-31: qty 250

## 2018-05-31 MED ORDER — DOXORUBICIN HCL CHEMO IV INJECTION 2 MG/ML
50.0000 mg/m2 | Freq: Once | INTRAVENOUS | Status: AC
  Administered 2018-05-31: 96 mg via INTRAVENOUS
  Filled 2018-05-31: qty 48

## 2018-05-31 MED ORDER — ACETAMINOPHEN 325 MG PO TABS
ORAL_TABLET | ORAL | Status: AC
  Filled 2018-05-31: qty 2

## 2018-05-31 MED ORDER — SODIUM CHLORIDE 0.9% FLUSH
10.0000 mL | INTRAVENOUS | Status: DC | PRN
  Administered 2018-05-31: 10 mL
  Filled 2018-05-31: qty 10

## 2018-05-31 MED ORDER — PALONOSETRON HCL INJECTION 0.25 MG/5ML
0.2500 mg | Freq: Once | INTRAVENOUS | Status: AC
  Administered 2018-05-31: 0.25 mg via INTRAVENOUS

## 2018-05-31 MED ORDER — VINCRISTINE SULFATE CHEMO INJECTION 1 MG/ML
2.0000 mg | Freq: Once | INTRAVENOUS | Status: AC
  Administered 2018-05-31: 2 mg via INTRAVENOUS
  Filled 2018-05-31: qty 2

## 2018-05-31 MED ORDER — SODIUM CHLORIDE 0.9 % IV SOLN
375.0000 mg/m2 | Freq: Once | INTRAVENOUS | Status: AC
  Administered 2018-05-31: 700 mg via INTRAVENOUS
  Filled 2018-05-31: qty 20

## 2018-05-31 MED ORDER — SODIUM CHLORIDE 0.9 % IV SOLN
750.0000 mg/m2 | Freq: Once | INTRAVENOUS | Status: AC
  Administered 2018-05-31: 1440 mg via INTRAVENOUS
  Filled 2018-05-31: qty 72

## 2018-05-31 MED ORDER — PALONOSETRON HCL INJECTION 0.25 MG/5ML
INTRAVENOUS | Status: AC
  Filled 2018-05-31: qty 5

## 2018-05-31 MED ORDER — DEXAMETHASONE SODIUM PHOSPHATE 10 MG/ML IJ SOLN
10.0000 mg | Freq: Once | INTRAMUSCULAR | Status: AC
  Administered 2018-05-31: 10 mg via INTRAVENOUS

## 2018-05-31 MED ORDER — HEPARIN SOD (PORK) LOCK FLUSH 100 UNIT/ML IV SOLN
500.0000 [IU] | Freq: Once | INTRAVENOUS | Status: AC | PRN
  Administered 2018-05-31: 500 [IU]
  Filled 2018-05-31: qty 5

## 2018-05-31 NOTE — Progress Notes (Signed)
Blood return checked every 90 seconds during Adriamycin push. Good blood return noted each time. Adrimycin pushed over 15 minutes. Patient tolerated well.

## 2018-05-31 NOTE — Telephone Encounter (Signed)
Printed avs and calender of upcoming appointment. Per 9/24 los 

## 2018-05-31 NOTE — Patient Instructions (Signed)
[facility] Discharge Instructions for Patients Receiving Chemotherapy  Today you received the following chemotherapy agents Adriamycin, Cytoxan, Vincristine, and Rituxan.   To help prevent nausea and vomiting after your treatment, we encourage you to take your nausea medication as directed.   If you develop nausea and vomiting that is not controlled by your nausea medication, call the clinic.   BELOW ARE SYMPTOMS THAT SHOULD BE REPORTED IMMEDIATELY:  *FEVER GREATER THAN 100.5 F  *CHILLS WITH OR WITHOUT FEVER  NAUSEA AND VOMITING THAT IS NOT CONTROLLED WITH YOUR NAUSEA MEDICATION  *UNUSUAL SHORTNESS OF BREATH  *UNUSUAL BRUISING OR BLEEDING  TENDERNESS IN MOUTH AND THROAT WITH OR WITHOUT PRESENCE OF ULCERS  *URINARY PROBLEMS  *BOWEL PROBLEMS  UNUSUAL RASH Items with * indicate a potential emergency and should be followed up as soon as possible.  Feel free to call the clinic should you have any questions or concerns. The clinic phone number is [phone].  Please show the CHEMO ALERT CARD at check-in to the Emergency Department and triage nurse.   

## 2018-06-02 ENCOUNTER — Inpatient Hospital Stay: Payer: Medicare PPO

## 2018-06-02 DIAGNOSIS — C8330 Diffuse large B-cell lymphoma, unspecified site: Secondary | ICD-10-CM | POA: Diagnosis not present

## 2018-06-02 DIAGNOSIS — R918 Other nonspecific abnormal finding of lung field: Secondary | ICD-10-CM | POA: Diagnosis not present

## 2018-06-02 DIAGNOSIS — E890 Postprocedural hypothyroidism: Secondary | ICD-10-CM | POA: Diagnosis not present

## 2018-06-02 DIAGNOSIS — Z7689 Persons encountering health services in other specified circumstances: Secondary | ICD-10-CM | POA: Diagnosis not present

## 2018-06-02 DIAGNOSIS — M5136 Other intervertebral disc degeneration, lumbar region: Secondary | ICD-10-CM | POA: Diagnosis not present

## 2018-06-02 DIAGNOSIS — I10 Essential (primary) hypertension: Secondary | ICD-10-CM | POA: Diagnosis not present

## 2018-06-02 DIAGNOSIS — M1990 Unspecified osteoarthritis, unspecified site: Secondary | ICD-10-CM | POA: Diagnosis not present

## 2018-06-02 DIAGNOSIS — Z5111 Encounter for antineoplastic chemotherapy: Secondary | ICD-10-CM | POA: Diagnosis not present

## 2018-06-02 DIAGNOSIS — Z85850 Personal history of malignant neoplasm of thyroid: Secondary | ICD-10-CM | POA: Diagnosis not present

## 2018-06-02 MED ORDER — PEGFILGRASTIM-CBQV 6 MG/0.6ML ~~LOC~~ SOSY
PREFILLED_SYRINGE | SUBCUTANEOUS | Status: AC
  Filled 2018-06-02: qty 0.6

## 2018-06-02 MED ORDER — PEGFILGRASTIM-CBQV 6 MG/0.6ML ~~LOC~~ SOSY
6.0000 mg | PREFILLED_SYRINGE | Freq: Once | SUBCUTANEOUS | Status: AC
  Administered 2018-06-02: 6 mg via SUBCUTANEOUS

## 2018-06-03 ENCOUNTER — Telehealth: Payer: Self-pay

## 2018-06-03 NOTE — Telephone Encounter (Signed)
Pt called this morning to be notified if it would be okay for him to have the "super flu" shot today or tomorrow. Dr. Irene Limbo confirmed this would be okay for the pt. LVM with pt notifying him that he is clear to get the flu shot.

## 2018-06-09 DIAGNOSIS — H35371 Puckering of macula, right eye: Secondary | ICD-10-CM | POA: Diagnosis not present

## 2018-06-09 DIAGNOSIS — H353211 Exudative age-related macular degeneration, right eye, with active choroidal neovascularization: Secondary | ICD-10-CM | POA: Diagnosis not present

## 2018-06-09 DIAGNOSIS — H35723 Serous detachment of retinal pigment epithelium, bilateral: Secondary | ICD-10-CM | POA: Diagnosis not present

## 2018-06-09 DIAGNOSIS — H35361 Drusen (degenerative) of macula, right eye: Secondary | ICD-10-CM | POA: Diagnosis not present

## 2018-06-09 DIAGNOSIS — H353231 Exudative age-related macular degeneration, bilateral, with active choroidal neovascularization: Secondary | ICD-10-CM | POA: Diagnosis not present

## 2018-06-09 DIAGNOSIS — H25813 Combined forms of age-related cataract, bilateral: Secondary | ICD-10-CM | POA: Diagnosis not present

## 2018-06-11 ENCOUNTER — Other Ambulatory Visit: Payer: Self-pay | Admitting: Internal Medicine

## 2018-06-11 DIAGNOSIS — I10 Essential (primary) hypertension: Secondary | ICD-10-CM

## 2018-06-14 NOTE — Telephone Encounter (Signed)
Dr.Banks Pt  

## 2018-06-20 NOTE — Progress Notes (Signed)
HEMATOLOGY/ONCOLOGY CLINIC NOTE  Date of Service: 06/21/2018  Patient Care Team: Billie Ruddy, MD as PCP - General (Family Medicine)  CHIEF COMPLAINTS/PURPOSE OF CONSULTATION:  Diffuse Large B-Cell Lymphoma  HISTORY OF PRESENTING ILLNESS:   Jay Webster is a wonderful 84 y.o. male who has been referred to [place] by surgeon Dr. Armandina Gemma for evaluation and management of Diffuse Large B-Cell Lymphoma. The pt reports that he is doing well overall.   Of note prior to the patient's visit today, pt has had Perineum biopsy completed on 03/11/18 with results revealing Diffuse Large B-Cell Lymphoma. The pt had surgery to remove his irregularly shaped mass in the left anterior portion of the perineum with Dr. Armandina Gemma.  The pt reports first noticing the mass in his left buttock about 2 months ago, after a visit with Dr. Trena Platt. The pt decided to consult his surgeon Dr. Armandina Gemma whom he previously had a thyroidectomy with. The pt notes that the spot became as large as a golf ball and did not breach the skin. The pt notes that it wasn't terribly uncomfortable to sit on. He denies feeling any differently recently than in the past  6 months to a year.   The pt notes that he has been having some continued discharge and is using Depends. He is changing his gauze twice each day.   He denies any recent medical problems. He continues to function independently at home and lives alone, as his partner of 30+ years died in the past year. He notes that he does not have any family members or friends who he would want to include in his care. He denies any difficulties functioning day to day and denies concerns for his memory.   He has been on thyroid replacement for the last 10 years following a thyroidectomy after thyroid cancer, and was treated with one dose of radioactive iodine.   The pt also notes a uric acid kidney stone history which hasn't been a problem in many years.    Most  recent lab results (02/28/18) of CBC  is as follows: all values are WNL except for HCT at 38.3.  On review of systems, pt reports left buttock surgical wound, ganglion cyst on right wrist, and denies fevers, chills, night sweats, unexpected weight loss, new fatigue, pain along the spine, leg swelling, noticing any new lumps or bumps, and any other symptoms.   On PMHx the pt denies heart problems, strokes, abdominal problems, lung problems.  On Social Hx the pt reports that he quit smoking in 1990, and used to smoke 1-2 packs of cigarettes each day before this. He denies excess alcohol being a problem, and consumes one glass of wine rarely. 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  On Family Hx the pt reports brother died of sarcoma, different brother with Type I DM, and denies other cancer or blood disorders.  Interval History:    Jay Webster returns today for management, evaluation, and C3 R-CHOP treatment of his recently diagnosed Diffuse Large B-Cell Lymphoma. The patient's last visit with [place] was on 05/31/18. The pt reports that he is doing well overall.   The pt reports that he has not developed any new concerns in the interim. He has lost his hair in the interim. He has been eating well and is planning on becoming more active. He has no constitutional symptoms and denies any concerns for infections.  Lab results today (06/21/18) of CBC w/diff, CMP is as follows: all values are WNL except for RBC at 3.55, HGB at 10.8, HCT at 32.2, Glucose at 104, Total Protein at 6.4, Albumin at 3.4.  On review of systems, pt reports eating well, and denies fevers, chills, night sweats, concerns for infections, skin rashes, nausea, vomiting, diarrhea, abdominal pains, leg swelling, pain in the buttocks, and any other symptoms.    MEDICAL HISTORY:  Past Medical History:  Diagnosis Date  . Arthritis    OA AND PAIN LEFT HIP AND OTHER JOINT PAINS  . Cancer (HCC)     THYROID CANCER - HAD THYROID REMOVED  . History of kidney stones   . History of shingles    RESOLVED  . Hypertension   . Hypothyroidism   . Low back pain   . Macular degeneration of both eyes   . RBBB (right bundle branch block with left anterior fascicular block)   . Seasonal allergies   . Thyroid disease   . Uric acid renal calculus 06/20/2007   Qualifier: Diagnosis of  By: Rogue Bussing CMA, Maryann Alar      SURGICAL HISTORY: Past Surgical History:  Procedure Laterality Date  . APPENDECTOMY  1948  . COLONOSCOPY    . Elbow Radical Reduction  1973  . IR IMAGING GUIDED PORT INSERTION  05/05/2018  . [place]  . MASS EXCISION N/A 03/11/2018   Procedure: EXCISION MASS OF PERINEUM;  Surgeon: Armandina Gemma, MD;  Location: [facility];  Service: General;  Laterality: N/A;  . REMOVAL OF FIRST RIB   NOV 1994   FOR COMPRESSED VEIN BETWEEN 1 ST RIB AND COLLARBONE  . Rib removed, 1st  1994  . ROTATOR CUFF REPAIR  2009  . THYROIDECTOMY  2010  . TOTAL HIP ARTHROPLASTY Left 05/31/2013   Procedure: LEFT TOTAL HIP ARTHROPLASTY ANTERIOR APPROACH;  Surgeon: Gearlean Alf, MD;  Location: [facility];  Service: Orthopedics;  Laterality: Left;    SOCIAL HISTORY: Social History   Socioeconomic History  . Marital status: Widowed    Spouse name: Not on file  . Number of children: Not on file  . Years of education: Not on file  . Highest education level: Not on file  Occupational History  . Occupation: retired  Scientific laboratory technician  . Financial resource strain: Not on file  . Food insecurity:    Worry: Not on file    Inability: Not on file  . Transportation needs:    Medical: Not on file    Non-medical: Not on file  Tobacco Use  . Smoking status: Former Smoker    Types: Cigarettes    Last attempt to quit: 02/22/1979    Years since quitting: 39.3  . Smokeless tobacco: Never Used  Substance and Sexual Activity  . Alcohol use: Yes    Alcohol/week: 0.0 standard drinks  . Drug use: No    . Sexual activity: Yes  Lifestyle  . Physical activity:    Days per week: Not on file    Minutes per session: Not on file  . Stress: Not on file  Relationships  . Social connections:    Talks on phone: Not on file    Gets together: Not on file    Attends religious service: Not on file    Active member of club or organization: Not on file    Attends meetings of clubs or organizations: Not on file    Relationship status: Not on file  .  Intimate partner violence:    Fear of current or ex partner: Not on file    Emotionally abused: Not on file    Physically abused: Not on file    Forced sexual activity: Not on file  Other Topics Concern  . Not on file  Social History Narrative  . Not on file    FAMILY HISTORY: Family History  Problem Relation Age of Onset  . Heart disease Mother   . Diabetes Mother   . Anemia Unknown   . Thyroid disease Unknown     ALLERGIES:  is allergic to penicillins.  MEDICATIONS:  Current Outpatient Medications  Medication Sig Dispense Refill  . acetaminophen (TYLENOL) 325 MG tablet Take 325-650 mg by mouth daily as needed for headache.    . Aflibercept (EYLEA) 2 MG/0.05ML SOLN Inject 1 Dose into the eye every 8 (eight) weeks.     Marland Kitchen allopurinol (ZYLOPRIM) 300 MG tablet TAKE 1 TABLET EVERY DAY 90 tablet 1  . benazepril (LOTENSIN) 20 MG tablet TAKE 1 TABLET EVERY DAY 90 tablet 1  . cetirizine (ZYRTEC) 10 MG tablet Take 10 mg by mouth daily as needed for allergies.    . Glucosamine-Chondroitin (OSTEO BI-FLEX REGULAR STRENGTH PO) Take 1 tablet by mouth 2 (two) times daily.    Marland Kitchen levothyroxine (SYNTHROID, LEVOTHROID) 125 MCG tablet Take 1 tablet (125 mcg total) by mouth daily. 30 tablet 0  . lidocaine-prilocaine (EMLA) cream Apply to affected area once 30 g 3  . Multiple Vitamin (MULTIVITAMIN) tablet Take 1 tablet by mouth daily.    . Multiple Vitamins-Minerals (PRESERVISION AREDS 2 PO) Take 1 tablet by mouth 2 (two) times daily.    . ondansetron  (ZOFRAN) 8 MG tablet Take 1 tablet (8 mg total) by mouth 2 (two) times daily as needed for refractory nausea / vomiting. Start on day 3 after chemotherapy. 30 tablet 1  . Polyethyl Glycol-Propyl Glycol (SYSTANE OP) Place 1 drop into both eyes daily as needed (irritation).    . predniSONE (DELTASONE) 20 MG tablet Take 3 tablets (60 mg total) by mouth daily. Take on days 1-5 of chemotherapy. 15 tablet 6  . prochlorperazine (COMPAZINE) 10 MG tablet Take 1 tablet (10 mg total) by mouth every 6 (six) hours as needed (Nausea or vomiting). (Patient not taking: Reported on 05/31/2018) 30 tablet 6  . sodium chloride (OCEAN) 0.65 % SOLN nasal spray Place 1 spray into both nostrils as needed for congestion.     No current facility-administered medications for this visit.     REVIEW OF SYSTEMS:    A 10+ POINT REVIEW OF SYSTEMS WAS OBTAINED including neurology, dermatology, psychiatry, cardiac, respiratory, lymph, extremities, GI, GU, Musculoskeletal, constitutional, breasts, reproductive, HEENT.  All pertinent positives are noted in the HPI.  All others are negative.   PHYSICAL EXAMINATION: ECOG PERFORMANCE STATUS: 2 - Symptomatic, <50% confined to bed  Vitals:   06/21/18 1039  BP: (!) 143/72  Pulse: 76  Resp: 18  Temp: 97.8 F (36.6 C)  SpO2: 100%   Filed Weights   06/21/18 1039  Weight: 177 lb 11.2 oz (80.6 kg)   .Body mass index is 30.5 kg/m.  GENERAL:alert, in no acute distress and comfortable SKIN: no acute rashes, no significant lesions EYES: conjunctiva are pink and non-injected, sclera anicteric OROPHARYNX: MMM, no exudates, no oropharyngeal erythema or ulceration. NECK: supple, no JVD LYMPH:  no palpable lymphadenopathy in the cervical, axillary or inguinal regions LUNGS: clear to auscultation b/l with normal respiratory effort HEART: regular rate &  rhythm ABDOMEN:  normoactive bowel sounds , non tender, not distended. No palpable hepatosplenomegaly.  Extremity: no pedal  edema PSYCH: alert & oriented x 3 with fluent speech NEURO: no focal motor/sensory deficits   LABORATORY DATA:  I have reviewed the data as listed  . CBC Latest Ref Rng & Units 06/21/2018 05/31/2018 05/20/2018  WBC 4.0 - 10.5 K/uL 5.8 7.9 7.5  Hemoglobin 13.0 - 17.0 g/dL 10.8(L) 11.6(L) 11.6(L)  Hematocrit 39.0 - 52.0 % 32.2(L) 34.2(L) 33.9(L)  Platelets 150 - 400 K/uL 251 334 119(L)   . CBC    Component Value Date/Time   WBC 5.8 06/21/2018 0919   RBC 3.55 (L) 06/21/2018 0919   HGB 10.8 (L) 06/21/2018 0919   HGB 11.6 (L) 05/31/2018 0855   HCT 32.2 (L) 06/21/2018 0919   PLT 251 06/21/2018 0919   PLT 334 05/31/2018 0855   MCV 90.7 06/21/2018 0919   MCH 30.4 06/21/2018 0919   MCHC 33.5 06/21/2018 0919   RDW 15.4 06/21/2018 0919   LYMPHSABS 1.0 06/21/2018 0919   MONOABS 0.6 06/21/2018 0919   EOSABS 0.1 06/21/2018 0919   BASOSABS 0.1 06/21/2018 0919    CMP Latest Ref Rng & Units 06/21/2018 05/31/2018 05/20/2018  Glucose 70 - 99 mg/dL 104(H) 100(H) 101(H)  BUN 8 - 23 mg/dL 16 18 13   Creatinine 0.61 - 1.24 mg/dL 0.89 0.78 0.74  Sodium 135 - 145 mmol/L 143 138 139  Potassium 3.5 - 5.1 mmol/L 4.3 4.2 4.0  Chloride 98 - 111 mmol/L 110 108 105  CO2 22 - 32 mmol/L 28 23 27   Calcium 8.9 - 10.3 mg/dL 9.2 9.0 8.8(L)  Total Protein 6.5 - 8.1 g/dL 6.4(L) 6.8 6.4(L)  Total Bilirubin 0.3 - 1.2 mg/dL 0.4 0.2(L) 0.2(L)  Alkaline Phos 38 - 126 U/L 92 86 99  AST 15 - 41 U/L 17 14(L) 12(L)  ALT 0 - 44 U/L 17 16 17    . Lab Results  Component Value Date   LDH 157 05/20/2018   Component     Latest Ref Rng & Units 03/24/2018  LDH     98 - 192 U/L 185  HCV Ab     0.0 - 0.9 s/co ratio 0.2  Hep B Core Ab, Tot     Negative Negative  Hepatitis B Surface Ag     Negative Negative  HIV Screen 4th Generation wRfx     Non Reactive Non Reactive    03/11/18 Perineum Bx:    RADIOGRAPHIC STUDIES: I have personally reviewed the radiological images as listed and agreed with the findings in the  report. No results found.  ASSESSMENT & PLAN:   84 y.o. male with  1. Diffuse Large B-Cell Lymphoma -newly diagnosed presenting as a mass in the left medial gluteal region/perineum s/p resection. -03/11/18 Surgical bx which revealed Diffuse Large B-Cell Non-Hodgkin's Lymphoma  -04/13/18 ECHO revealed LV EF of 60-65%  -No Hep B, Hep C or HIV from 03/24/18 labs -04/18/18 PET/CT which revealed Mildly hypermetabolic small to borderline enlarged abdominal and pelvic retroperitoneal lymph nodes. Probable mild hypermetabolism within subcentimeter axillary lymph nodes, with misregistration on fused images. 2. Scattered lucent lesions in the iliac wings and right fifth rib. Continued attention on follow-up exams is warranted. 3. Scattered small pulmonary nodules are nonspecific and too small for PET resolution. Continued attention on follow-up exams is warranted. 4. Intermediate density lesion deep to the left gluteal fold, with associated hypermetabolism, which may represent a complex cyst or abscess. 5. Aortic atherosclerosis.  Coronary artery calcification. 6. Left renal stone. -Treatment plan include R-CHOP for 6 cycles starting 05/10/18   PLAN:  -Pt notes he is doing well living alone. He notes has a DNR in place at home. He has good support from his niece for who he speaks with daily.  -Discussed pt labwork today, 06/21/18; blood counts and chemistries are stable  -Avoid raw foods during treatment. Wear a mask when doing yard work. -The pt has no prohibitive toxicities from continuing C3 R-CHOP with G-CSF support at this time.   -chemotherapy orders placed, reviewed and signed. -Will complete PET/CT after the conclusion of C3 -Recommended that the pt continue to eat well, drink at least 48-64 oz of water each day, and walk 20-30 minutes each day.   -Will see the pt back on C4D1, sooner if any new concerns   2.  Patient Active Problem List   Diagnosis Date Noted  . Diffuse large B-cell lymphoma ([place])  03/30/2018  . Perineal mass, male 03/11/2018  . Perineal mass in male 03/08/2018  . Degeneration of lumbar intervertebral disc 10/05/2017  . History of total hip arthroplasty 10/05/2017  . Combined forms of age-related cataract of both eyes 04/12/2014  . OA (osteoarthritis) of hip 05/31/2013  . History of revision of total replacement of left hip joint 05/31/2013  . High risk medication use 10/25/2012  . Vitreomacular adhesion 05/24/2012  . Posterior vitreous detachment 04/12/2012  . Cystoid macular edema 11/10/2011  . Subretinal neovascularization of macula 09/11/2011  . Retinal pigment epithelial detachment, bilateral 09/11/2011  . Macular degeneration of both eyes 06/09/2011  . History of thyroid cancer 04/21/2011  . Macular degeneration 01/05/2011  . CARCINOMA, THYROID GLAND, PAPILLARY 11/20/2008  . HYPOTHYROIDISM, POSTSURGICAL 11/20/2008  . Osteoarthritis 07/01/2007  . LOW BACK PAIN 07/01/2007  . Uric acid renal calculus 06/20/2007  . Essential hypertension 06/20/2007   -continue f/u with PCP   Please schedule C4 of R-CHOP with neulasta in 3 weeks with labs and MD visit PET/CT in 18 days     All of the patients questions were answered with apparent satisfaction. The patient knows to call the clinic with any problems, questions or concerns.  The total time spent in the appt was 25 minutes and more than 50% was on counseling and direct patient cares.    Sullivan Lone MD MS AAHIVMS [facility] Hematology/Oncology Physician [facility]  (Office):       [phone] (Work cell):  [phone] (Fax):           [phone]  06/21/2018 10:59 AM  I, Baldwin [place], am acting as a scribe for Dr. Irene Limbo  .I have reviewed the above documentation for accuracy and completeness, and I agree with the above. Brunetta Genera MD

## 2018-06-21 ENCOUNTER — Inpatient Hospital Stay (HOSPITAL_BASED_OUTPATIENT_CLINIC_OR_DEPARTMENT_OTHER): Payer: Medicare PPO | Admitting: Hematology

## 2018-06-21 ENCOUNTER — Inpatient Hospital Stay: Payer: Medicare PPO | Attending: Hematology

## 2018-06-21 ENCOUNTER — Inpatient Hospital Stay: Payer: Medicare PPO

## 2018-06-21 VITALS — BP 143/72 | HR 76 | Temp 97.8°F | Resp 18 | Ht 64.0 in | Wt 177.7 lb

## 2018-06-21 VITALS — BP 126/70 | HR 72 | Temp 98.2°F | Resp 16

## 2018-06-21 DIAGNOSIS — Z87442 Personal history of urinary calculi: Secondary | ICD-10-CM | POA: Diagnosis not present

## 2018-06-21 DIAGNOSIS — Z7689 Persons encountering health services in other specified circumstances: Secondary | ICD-10-CM | POA: Diagnosis not present

## 2018-06-21 DIAGNOSIS — Z66 Do not resuscitate: Secondary | ICD-10-CM | POA: Insufficient documentation

## 2018-06-21 DIAGNOSIS — H3530 Unspecified macular degeneration: Secondary | ICD-10-CM | POA: Diagnosis not present

## 2018-06-21 DIAGNOSIS — Z85850 Personal history of malignant neoplasm of thyroid: Secondary | ICD-10-CM | POA: Insufficient documentation

## 2018-06-21 DIAGNOSIS — M67431 Ganglion, right wrist: Secondary | ICD-10-CM | POA: Insufficient documentation

## 2018-06-21 DIAGNOSIS — I700 Atherosclerosis of aorta: Secondary | ICD-10-CM | POA: Insufficient documentation

## 2018-06-21 DIAGNOSIS — Z5111 Encounter for antineoplastic chemotherapy: Secondary | ICD-10-CM | POA: Insufficient documentation

## 2018-06-21 DIAGNOSIS — I2510 Atherosclerotic heart disease of native coronary artery without angina pectoris: Secondary | ICD-10-CM | POA: Diagnosis not present

## 2018-06-21 DIAGNOSIS — C8330 Diffuse large B-cell lymphoma, unspecified site: Secondary | ICD-10-CM | POA: Diagnosis not present

## 2018-06-21 DIAGNOSIS — E039 Hypothyroidism, unspecified: Secondary | ICD-10-CM | POA: Insufficient documentation

## 2018-06-21 DIAGNOSIS — R918 Other nonspecific abnormal finding of lung field: Secondary | ICD-10-CM | POA: Diagnosis not present

## 2018-06-21 DIAGNOSIS — Z87891 Personal history of nicotine dependence: Secondary | ICD-10-CM | POA: Insufficient documentation

## 2018-06-21 DIAGNOSIS — I10 Essential (primary) hypertension: Secondary | ICD-10-CM | POA: Insufficient documentation

## 2018-06-21 DIAGNOSIS — Z5112 Encounter for antineoplastic immunotherapy: Secondary | ICD-10-CM | POA: Diagnosis not present

## 2018-06-21 DIAGNOSIS — Z79899 Other long term (current) drug therapy: Secondary | ICD-10-CM | POA: Insufficient documentation

## 2018-06-21 LAB — COMPREHENSIVE METABOLIC PANEL
ALK PHOS: 92 U/L (ref 38–126)
ALT: 17 U/L (ref 0–44)
ANION GAP: 5 (ref 5–15)
AST: 17 U/L (ref 15–41)
Albumin: 3.4 g/dL — ABNORMAL LOW (ref 3.5–5.0)
BUN: 16 mg/dL (ref 8–23)
CALCIUM: 9.2 mg/dL (ref 8.9–10.3)
CO2: 28 mmol/L (ref 22–32)
CREATININE: 0.89 mg/dL (ref 0.61–1.24)
Chloride: 110 mmol/L (ref 98–111)
Glucose, Bld: 104 mg/dL — ABNORMAL HIGH (ref 70–99)
Potassium: 4.3 mmol/L (ref 3.5–5.1)
SODIUM: 143 mmol/L (ref 135–145)
Total Bilirubin: 0.4 mg/dL (ref 0.3–1.2)
Total Protein: 6.4 g/dL — ABNORMAL LOW (ref 6.5–8.1)

## 2018-06-21 LAB — CBC WITH DIFFERENTIAL/PLATELET
Abs Immature Granulocytes: 0.1 10*3/uL — ABNORMAL HIGH (ref 0.00–0.07)
BASOS PCT: 2 %
Basophils Absolute: 0.1 10*3/uL (ref 0.0–0.1)
EOS ABS: 0.1 10*3/uL (ref 0.0–0.5)
EOS PCT: 1 %
HEMATOCRIT: 32.2 % — AB (ref 39.0–52.0)
Hemoglobin: 10.8 g/dL — ABNORMAL LOW (ref 13.0–17.0)
Immature Granulocytes: 2 %
LYMPHS ABS: 1 10*3/uL (ref 0.7–4.0)
Lymphocytes Relative: 17 %
MCH: 30.4 pg (ref 26.0–34.0)
MCHC: 33.5 g/dL (ref 30.0–36.0)
MCV: 90.7 fL (ref 80.0–100.0)
MONO ABS: 0.6 10*3/uL (ref 0.1–1.0)
MONOS PCT: 10 %
NEUTROS PCT: 68 %
Neutro Abs: 4 10*3/uL (ref 1.7–7.7)
PLATELETS: 251 10*3/uL (ref 150–400)
RBC: 3.55 MIL/uL — ABNORMAL LOW (ref 4.22–5.81)
RDW: 15.4 % (ref 11.5–15.5)
WBC: 5.8 10*3/uL (ref 4.0–10.5)
nRBC: 0 % (ref 0.0–0.2)

## 2018-06-21 MED ORDER — DIPHENHYDRAMINE HCL 25 MG PO CAPS
ORAL_CAPSULE | ORAL | Status: AC
  Filled 2018-06-21: qty 2

## 2018-06-21 MED ORDER — SODIUM CHLORIDE 0.9 % IV SOLN
375.0000 mg/m2 | Freq: Once | INTRAVENOUS | Status: AC
  Administered 2018-06-21: 700 mg via INTRAVENOUS
  Filled 2018-06-21: qty 20

## 2018-06-21 MED ORDER — PALONOSETRON HCL INJECTION 0.25 MG/5ML
INTRAVENOUS | Status: AC
  Filled 2018-06-21: qty 5

## 2018-06-21 MED ORDER — VINCRISTINE SULFATE CHEMO INJECTION 1 MG/ML
2.0000 mg | Freq: Once | INTRAVENOUS | Status: AC
  Administered 2018-06-21: 2 mg via INTRAVENOUS
  Filled 2018-06-21: qty 2

## 2018-06-21 MED ORDER — SODIUM CHLORIDE 0.9 % IV SOLN
750.0000 mg/m2 | Freq: Once | INTRAVENOUS | Status: AC
  Administered 2018-06-21: 1440 mg via INTRAVENOUS
  Filled 2018-06-21: qty 72

## 2018-06-21 MED ORDER — DIPHENHYDRAMINE HCL 25 MG PO CAPS
50.0000 mg | ORAL_CAPSULE | Freq: Once | ORAL | Status: AC
  Administered 2018-06-21: 50 mg via ORAL

## 2018-06-21 MED ORDER — DOXORUBICIN HCL CHEMO IV INJECTION 2 MG/ML
50.0000 mg/m2 | Freq: Once | INTRAVENOUS | Status: AC
  Administered 2018-06-21: 96 mg via INTRAVENOUS
  Filled 2018-06-21: qty 48

## 2018-06-21 MED ORDER — ACETAMINOPHEN 325 MG PO TABS
650.0000 mg | ORAL_TABLET | Freq: Once | ORAL | Status: AC
  Administered 2018-06-21: 650 mg via ORAL

## 2018-06-21 MED ORDER — DEXAMETHASONE SODIUM PHOSPHATE 10 MG/ML IJ SOLN
INTRAMUSCULAR | Status: AC
  Filled 2018-06-21: qty 1

## 2018-06-21 MED ORDER — SODIUM CHLORIDE 0.9 % IV SOLN
Freq: Once | INTRAVENOUS | Status: AC
  Administered 2018-06-21: 12:00:00 via INTRAVENOUS
  Filled 2018-06-21: qty 250

## 2018-06-21 MED ORDER — HEPARIN SOD (PORK) LOCK FLUSH 100 UNIT/ML IV SOLN
500.0000 [IU] | Freq: Once | INTRAVENOUS | Status: AC | PRN
  Administered 2018-06-21: 500 [IU]
  Filled 2018-06-21: qty 5

## 2018-06-21 MED ORDER — DEXAMETHASONE SODIUM PHOSPHATE 10 MG/ML IJ SOLN
10.0000 mg | Freq: Once | INTRAMUSCULAR | Status: AC
  Administered 2018-06-21: 6 mg via INTRAVENOUS

## 2018-06-21 MED ORDER — PALONOSETRON HCL INJECTION 0.25 MG/5ML
0.2500 mg | Freq: Once | INTRAVENOUS | Status: AC
  Administered 2018-06-21: 0.25 mg via INTRAVENOUS

## 2018-06-21 MED ORDER — SODIUM CHLORIDE 0.9% FLUSH
10.0000 mL | INTRAVENOUS | Status: DC | PRN
  Administered 2018-06-21: 10 mL
  Filled 2018-06-21: qty 10

## 2018-06-21 MED ORDER — ACETAMINOPHEN 325 MG PO TABS
ORAL_TABLET | ORAL | Status: AC
  Filled 2018-06-21: qty 2

## 2018-06-21 NOTE — Patient Instructions (Signed)
Cancer Center Discharge Instructions for Patients Receiving Chemotherapy  Today you received the following chemotherapy agents Adriamycin, Cytoxan, Vincristine, and Rituxan.   To help prevent nausea and vomiting after your treatment, we encourage you to take your nausea medication as directed.   If you develop nausea and vomiting that is not controlled by your nausea medication, call the clinic.   BELOW ARE SYMPTOMS THAT SHOULD BE REPORTED IMMEDIATELY:  *FEVER GREATER THAN 100.5 F  *CHILLS WITH OR WITHOUT FEVER  NAUSEA AND VOMITING THAT IS NOT CONTROLLED WITH YOUR NAUSEA MEDICATION  *UNUSUAL SHORTNESS OF BREATH  *UNUSUAL BRUISING OR BLEEDING  TENDERNESS IN MOUTH AND THROAT WITH OR WITHOUT PRESENCE OF ULCERS  *URINARY PROBLEMS  *BOWEL PROBLEMS  UNUSUAL RASH Items with * indicate a potential emergency and should be followed up as soon as possible.  Feel free to call the clinic should you have any questions or concerns. The clinic phone number is [phone].  Please show the CHEMO ALERT CARD at check-in to the Emergency Department and triage nurse.   

## 2018-06-22 ENCOUNTER — Telehealth: Payer: Self-pay

## 2018-06-22 NOTE — Telephone Encounter (Signed)
Patient already scheduled per 10/15 los. Mailed a calender and letter as a reminder

## 2018-06-23 ENCOUNTER — Inpatient Hospital Stay: Payer: Medicare PPO

## 2018-06-23 VITALS — BP 140/80 | HR 78 | Temp 98.0°F | Resp 18

## 2018-06-23 DIAGNOSIS — Z7689 Persons encountering health services in other specified circumstances: Secondary | ICD-10-CM | POA: Diagnosis not present

## 2018-06-23 DIAGNOSIS — C8330 Diffuse large B-cell lymphoma, unspecified site: Secondary | ICD-10-CM | POA: Diagnosis not present

## 2018-06-23 DIAGNOSIS — M67431 Ganglion, right wrist: Secondary | ICD-10-CM | POA: Diagnosis not present

## 2018-06-23 DIAGNOSIS — I2510 Atherosclerotic heart disease of native coronary artery without angina pectoris: Secondary | ICD-10-CM | POA: Diagnosis not present

## 2018-06-23 DIAGNOSIS — Z5112 Encounter for antineoplastic immunotherapy: Secondary | ICD-10-CM | POA: Diagnosis not present

## 2018-06-23 DIAGNOSIS — I700 Atherosclerosis of aorta: Secondary | ICD-10-CM | POA: Diagnosis not present

## 2018-06-23 DIAGNOSIS — R918 Other nonspecific abnormal finding of lung field: Secondary | ICD-10-CM | POA: Diagnosis not present

## 2018-06-23 DIAGNOSIS — I10 Essential (primary) hypertension: Secondary | ICD-10-CM | POA: Diagnosis not present

## 2018-06-23 DIAGNOSIS — Z5111 Encounter for antineoplastic chemotherapy: Secondary | ICD-10-CM | POA: Diagnosis not present

## 2018-06-23 MED ORDER — PEGFILGRASTIM-CBQV 6 MG/0.6ML ~~LOC~~ SOSY
6.0000 mg | PREFILLED_SYRINGE | Freq: Once | SUBCUTANEOUS | Status: AC
  Administered 2018-06-23: 6 mg via SUBCUTANEOUS

## 2018-06-23 MED ORDER — PEGFILGRASTIM-CBQV 6 MG/0.6ML ~~LOC~~ SOSY
PREFILLED_SYRINGE | SUBCUTANEOUS | Status: AC
  Filled 2018-06-23: qty 0.6

## 2018-06-23 NOTE — Patient Instructions (Signed)
Pegfilgrastim injection What is this medicine? PEGFILGRASTIM (PEG fil gra stim) is a long-acting granulocyte colony-stimulating factor that stimulates the growth of neutrophils, a type of white blood cell important in the body's fight against infection. It is used to reduce the incidence of fever and infection in patients with certain types of cancer who are receiving chemotherapy that affects the bone marrow, and to increase survival after being exposed to high doses of radiation. This medicine may be used for other purposes; ask your health care provider or pharmacist if you have questions. COMMON BRAND NAME(S): Neulasta What should I tell my health care provider before I take this medicine? They need to know if you have any of these conditions: -kidney disease -latex allergy -ongoing radiation therapy -sickle cell disease -skin reactions to acrylic adhesives (On-Body Injector only) -an unusual or allergic reaction to pegfilgrastim, filgrastim, other medicines, foods, dyes, or preservatives -pregnant or trying to get pregnant -breast-feeding How should I use this medicine? This medicine is for injection under the skin. If you get this medicine at home, you will be taught how to prepare and give the pre-filled syringe or how to use the On-body Injector. Refer to the patient Instructions for Use for detailed instructions. Use exactly as directed. Tell your healthcare provider immediately if you suspect that the On-body Injector may not have performed as intended or if you suspect the use of the On-body Injector resulted in a missed or partial dose. It is important that you put your used needles and syringes in a special sharps container. Do not put them in a trash can. If you do not have a sharps container, call your pharmacist or healthcare provider to get one. Talk to your pediatrician regarding the use of this medicine in children. While this drug may be prescribed for selected conditions,  precautions do apply. Overdosage: If you think you have taken too much of this medicine contact a poison control center or emergency room at once. NOTE: This medicine is only for you. Do not share this medicine with others. What if I miss a dose? It is important not to miss your dose. Call your doctor or health care professional if you miss your dose. If you miss a dose due to an On-body Injector failure or leakage, a new dose should be administered as soon as possible using a single prefilled syringe for manual use. What may interact with this medicine? Interactions have not been studied. Give your health care provider a list of all the medicines, herbs, non-prescription drugs, or dietary supplements you use. Also tell them if you smoke, drink alcohol, or use illegal drugs. Some items may interact with your medicine. This list may not describe all possible interactions. Give your health care provider a list of all the medicines, herbs, non-prescription drugs, or dietary supplements you use. Also tell them if you smoke, drink alcohol, or use illegal drugs. Some items may interact with your medicine. What should I watch for while using this medicine? You may need blood work done while you are taking this medicine. If you are going to need a MRI, CT scan, or other procedure, tell your doctor that you are using this medicine (On-Body Injector only). What side effects may I notice from receiving this medicine? Side effects that you should report to your doctor or health care professional as soon as possible: -allergic reactions like skin rash, itching or hives, swelling of the face, lips, or tongue -dizziness -fever -pain, redness, or irritation at site   where injected -pinpoint red spots on the skin -red or dark-brown urine -shortness of breath or breathing problems -stomach or side pain, or pain at the shoulder -swelling -tiredness -trouble passing urine or change in the amount of urine Side  effects that usually do not require medical attention (report to your doctor or health care professional if they continue or are bothersome): -bone pain -muscle pain This list may not describe all possible side effects. Call your doctor for medical advice about side effects. You may report side effects to FDA at 1-800-FDA-1088. Where should I keep my medicine? Keep out of the reach of children. Store pre-filled syringes in a refrigerator between 2 and 8 degrees C (36 and 46 degrees F). Do not freeze. Keep in carton to protect from light. Throw away this medicine if it is left out of the refrigerator for more than 48 hours. Throw away any unused medicine after the expiration date. NOTE: This sheet is a summary. It may not cover all possible information. If you have questions about this medicine, talk to your doctor, pharmacist, or health care provider.  2018 Elsevier/Gold Standard (2016-08-20 12:58:03)  

## 2018-07-01 ENCOUNTER — Other Ambulatory Visit: Payer: Self-pay | Admitting: Hematology

## 2018-07-01 ENCOUNTER — Telehealth: Payer: Self-pay

## 2018-07-01 NOTE — Telephone Encounter (Signed)
High priority scheduling msg sent per MD to move lab and doctor visit to 07/07/18 from 11/5. Pt still to receive infusion on 11/5. Availability on 1pm on 10/31 for MD visit that Dr. Irene Limbo approved to be used for the pt.

## 2018-07-06 NOTE — Progress Notes (Signed)
HEMATOLOGY/ONCOLOGY CLINIC NOTE  Date of Service: 07/07/2018  Patient Care Team: Billie Ruddy, MD as PCP - General (Family Medicine)  CHIEF COMPLAINTS/PURPOSE OF CONSULTATION:  Diffuse Large B-Cell Lymphoma  HISTORY OF PRESENTING ILLNESS:   Jay Webster is a wonderful 84 y.o. male who has been referred to [place] by surgeon Dr. Armandina Gemma for evaluation and management of Diffuse Large B-Cell Lymphoma. The pt reports that he is doing well overall.   Of note prior to the patient's visit today, pt has had Perineum biopsy completed on 03/11/18 with results revealing Diffuse Large B-Cell Lymphoma. The pt had surgery to remove his irregularly shaped mass in the left anterior portion of the perineum with Dr. Armandina Gemma.  The pt reports first noticing the mass in his left buttock about 2 months ago, after a visit with Dr. Trena Platt. The pt decided to consult his surgeon Dr. Armandina Gemma whom he previously had a thyroidectomy with. The pt notes that the spot became as large as a golf ball and did not breach the skin. The pt notes that it wasn't terribly uncomfortable to sit on. He denies feeling any differently recently than in the past  6 months to a year.   The pt notes that he has been having some continued discharge and is using Depends. He is changing his gauze twice each day.   He denies any recent medical problems. He continues to function independently at home and lives alone, as his partner of 30+ years died in the past year. He notes that he does not have any family members or friends who he would want to include in his care. He denies any difficulties functioning day to day and denies concerns for his memory.   He has been on thyroid replacement for the last 10 years following a thyroidectomy after thyroid cancer, and was treated with one dose of radioactive iodine.   The pt also notes a uric acid kidney stone history which hasn't been a problem in many years.    Most  recent lab results (02/28/18) of CBC  is as follows: all values are WNL except for HCT at 38.3.  On review of systems, pt reports left buttock surgical wound, ganglion cyst on right wrist, and denies fevers, chills, night sweats, unexpected weight loss, new fatigue, pain along the spine, leg swelling, noticing any new lumps or bumps, and any other symptoms.   On PMHx the pt denies heart problems, strokes, abdominal problems, lung problems.  On Social Hx the pt reports that he quit smoking in 1990, and used to smoke 1-2 packs of cigarettes each day before this. He denies excess alcohol being a problem, and consumes one glass of wine rarely. 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  On Family Hx the pt reports brother died of sarcoma, different brother with Type I DM, and denies other cancer or blood disorders.  Interval History:    Jay Webster returns today for management, evaluation, prior to C4 R-CHOP treatment of his recently diagnosed Diffuse Large B-Cell Lymphoma. The patient's last visit with [place] was on 06/21/18. The pt reports that he is doing well overall.   The pt reports that he has continued to enjoy good energy levels. He is eating very well and notes that he has not had any nausea, vomiting, or diarrhea. He has also kept active and continues tending to his yard work. The pt denies any  fevers, chills or night sweats. He notes that he is continuing to tolerate treatment well.  Lab results today (07/07/18) of CBC w/diff, CMP is as follows: all values are WNL except for RBC at 3.55, HGB at 10.9, HCT at 32.7, RDW at 16.4, Abs immature granulocytes at 0.26k, Glucose at 107, Total Protein at 6.4, Albumin at 3.2, AST at 14. 07/07/18 LDH is WNL at 185  On review of systems, pt reports good energy levels, eating well, staying active, and denies nausea, vomiting, diarrhea, fevers, chills, night sweats, mouth sores, and any other symptoms.   MEDICAL  HISTORY:  Past Medical History:  Diagnosis Date  . Arthritis    OA AND PAIN LEFT HIP AND OTHER JOINT PAINS  . Cancer (HCC)    THYROID CANCER - HAD THYROID REMOVED  . History of kidney stones   . History of shingles    RESOLVED  . Hypertension   . Hypothyroidism   . Low back pain   . Macular degeneration of both eyes   . RBBB (right bundle branch block with left anterior fascicular block)   . Seasonal allergies   . Thyroid disease   . Uric acid renal calculus 06/20/2007   Qualifier: Diagnosis of  By: Rogue Bussing CMA, Maryann Alar      SURGICAL HISTORY: Past Surgical History:  Procedure Laterality Date  . APPENDECTOMY  1948  . COLONOSCOPY    . Elbow Radical Reduction  1973  . IR IMAGING GUIDED PORT INSERTION  05/05/2018  . [place]  . MASS EXCISION N/A 03/11/2018   Procedure: EXCISION MASS OF PERINEUM;  Surgeon: Armandina Gemma, MD;  Location: [facility];  Service: General;  Laterality: N/A;  . REMOVAL OF FIRST RIB   NOV 1994   FOR COMPRESSED VEIN BETWEEN 1 ST RIB AND COLLARBONE  . Rib removed, 1st  1994  . ROTATOR CUFF REPAIR  2009  . THYROIDECTOMY  2010  . TOTAL HIP ARTHROPLASTY Left 05/31/2013   Procedure: LEFT TOTAL HIP ARTHROPLASTY ANTERIOR APPROACH;  Surgeon: Gearlean Alf, MD;  Location: [facility];  Service: Orthopedics;  Laterality: Left;    SOCIAL HISTORY: Social History   Socioeconomic History  . Marital status: Widowed    Spouse name: Not on file  . Number of children: Not on file  . Years of education: Not on file  . Highest education level: Not on file  Occupational History  . Occupation: retired  Scientific laboratory technician  . Financial resource strain: Not on file  . Food insecurity:    Worry: Not on file    Inability: Not on file  . Transportation needs:    Medical: Not on file    Non-medical: Not on file  Tobacco Use  . Smoking status: Former Smoker    Types: Cigarettes    Last attempt to quit: 02/22/1979    Years since quitting: 39.3  . Smokeless  tobacco: Never Used  Substance and Sexual Activity  . Alcohol use: Yes    Alcohol/week: 0.0 standard drinks  . Drug use: No  . Sexual activity: Yes  Lifestyle  . Physical activity:    Days per week: Not on file    Minutes per session: Not on file  . Stress: Not on file  Relationships  . Social connections:    Talks on phone: Not on file    Gets together: Not on file    Attends religious service: Not on file    Active member of club or organization:  Not on file    Attends meetings of clubs or organizations: Not on file    Relationship status: Not on file  . Intimate partner violence:    Fear of current or ex partner: Not on file    Emotionally abused: Not on file    Physically abused: Not on file    Forced sexual activity: Not on file  Other Topics Concern  . Not on file  Social History Narrative  . Not on file    FAMILY HISTORY: Family History  Problem Relation Age of Onset  . Heart disease Mother   . Diabetes Mother   . Anemia Unknown   . Thyroid disease Unknown     ALLERGIES:  is allergic to penicillins.  MEDICATIONS:  Current Outpatient Medications  Medication Sig Dispense Refill  . acetaminophen (TYLENOL) 325 MG tablet Take 325-650 mg by mouth daily as needed for headache.    . Aflibercept (EYLEA) 2 MG/0.05ML SOLN Inject 1 Dose into the eye every 8 (eight) weeks.     Marland Kitchen allopurinol (ZYLOPRIM) 300 MG tablet TAKE 1 TABLET EVERY DAY 90 tablet 1  . benazepril (LOTENSIN) 20 MG tablet TAKE 1 TABLET EVERY DAY 90 tablet 1  . cetirizine (ZYRTEC) 10 MG tablet Take 10 mg by mouth daily as needed for allergies.    . Glucosamine-Chondroitin (OSTEO BI-FLEX REGULAR STRENGTH PO) Take 1 tablet by mouth 2 (two) times daily.    Marland Kitchen levothyroxine (SYNTHROID, LEVOTHROID) 125 MCG tablet Take 1 tablet (125 mcg total) by mouth daily. 30 tablet 0  . lidocaine-prilocaine (EMLA) cream Apply to affected area once 30 g 3  . Multiple Vitamin (MULTIVITAMIN) tablet Take 1 tablet by mouth daily.     . Multiple Vitamins-Minerals (PRESERVISION AREDS 2 PO) Take 1 tablet by mouth 2 (two) times daily.    . ondansetron (ZOFRAN) 8 MG tablet Take 1 tablet (8 mg total) by mouth 2 (two) times daily as needed for refractory nausea / vomiting. Start on day 3 after chemotherapy. 30 tablet 1  . Polyethyl Glycol-Propyl Glycol (SYSTANE OP) Place 1 drop into both eyes daily as needed (irritation).    . predniSONE (DELTASONE) 20 MG tablet Take 3 tablets (60 mg total) by mouth daily. Take on days 1-5 of chemotherapy. 15 tablet 6  . prochlorperazine (COMPAZINE) 10 MG tablet Take 1 tablet (10 mg total) by mouth every 6 (six) hours as needed (Nausea or vomiting). (Patient not taking: Reported on 05/31/2018) 30 tablet 6  . sodium chloride (OCEAN) 0.65 % SOLN nasal spray Place 1 spray into both nostrils as needed for congestion.     No current facility-administered medications for this visit.     REVIEW OF SYSTEMS:    A 10+ POINT REVIEW OF SYSTEMS WAS OBTAINED including neurology, dermatology, psychiatry, cardiac, respiratory, lymph, extremities, GI, GU, Musculoskeletal, constitutional, breasts, reproductive, HEENT.  All pertinent positives are noted in the HPI.  All others are negative.   PHYSICAL EXAMINATION: ECOG PERFORMANCE STATUS: 2 - Symptomatic, <50% confined to bed  Vitals:   07/07/18 1304  BP: (!) 120/52  Pulse: 73  Resp: 16  Temp: 98.5 F (36.9 C)  SpO2: 98%   Filed Weights   07/07/18 1304  Weight: 175 lb 9.6 oz (79.7 kg)   .Body mass index is 30.14 kg/m.  GENERAL:alert, in no acute distress and comfortable SKIN: no acute rashes, no significant lesions EYES: conjunctiva are pink and non-injected, sclera anicteric OROPHARYNX: MMM, no exudates, no oropharyngeal erythema or ulceration NECK: supple, no  JVD LYMPH:  no palpable lymphadenopathy in the cervical, axillary or inguinal regions LUNGS: clear to auscultation b/l with normal respiratory effort HEART: regular rate & rhythm ABDOMEN:   normoactive bowel sounds , non tender, not distended. No palpable hepatosplenomegaly.  Extremity: no pedal edema PSYCH: alert & oriented x 3 with fluent speech NEURO: no focal motor/sensory deficits   LABORATORY DATA:  I have reviewed the data as listed  . CBC Latest Ref Rng & Units 07/07/2018 06/21/2018 05/31/2018  WBC 4.0 - 10.5 K/uL 9.0 5.8 7.9  Hemoglobin 13.0 - 17.0 g/dL 10.9(L) 10.8(L) 11.6(L)  Hematocrit 39.0 - 52.0 % 32.7(L) 32.2(L) 34.2(L)  Platelets 150 - 400 K/uL 224 251 334   . CBC    Component Value Date/Time   WBC 9.0 07/07/2018 1219   RBC 3.55 (L) 07/07/2018 1219   HGB 10.9 (L) 07/07/2018 1219   HGB 11.6 (L) 05/31/2018 0855   HCT 32.7 (L) 07/07/2018 1219   PLT 224 07/07/2018 1219   PLT 334 05/31/2018 0855   MCV 92.1 07/07/2018 1219   MCH 30.7 07/07/2018 1219   MCHC 33.3 07/07/2018 1219   RDW 16.4 (H) 07/07/2018 1219   LYMPHSABS 1.1 07/07/2018 1219   MONOABS 0.7 07/07/2018 1219   EOSABS 0.2 07/07/2018 1219   BASOSABS 0.1 07/07/2018 1219    CMP Latest Ref Rng & Units 07/07/2018 06/21/2018 05/31/2018  Glucose 70 - 99 mg/dL 107(H) 104(H) 100(H)  BUN 8 - 23 mg/dL 14 16 18   Creatinine 0.61 - 1.24 mg/dL 0.85 0.89 0.78  Sodium 135 - 145 mmol/L 142 143 138  Potassium 3.5 - 5.1 mmol/L 4.9 4.3 4.2  Chloride 98 - 111 mmol/L 108 110 108  CO2 22 - 32 mmol/L 27 28 23   Calcium 8.9 - 10.3 mg/dL 9.0 9.2 9.0  Total Protein 6.5 - 8.1 g/dL 6.4(L) 6.4(L) 6.8  Total Bilirubin 0.3 - 1.2 mg/dL 0.3 0.4 0.2(L)  Alkaline Phos 38 - 126 U/L 102 92 86  AST 15 - 41 U/L 14(L) 17 14(L)  ALT 0 - 44 U/L 12 17 16    . Lab Results  Component Value Date   LDH 185 07/07/2018   Component     Latest Ref Rng & Units 03/24/2018  LDH     98 - 192 U/L 185  HCV Ab     0.0 - 0.9 s/co ratio 0.2  Hep B Core Ab, Tot     Negative Negative  Hepatitis B Surface Ag     Negative Negative  HIV Screen 4th Generation wRfx     Non Reactive Non Reactive    03/11/18 Perineum Bx:    RADIOGRAPHIC  STUDIES: I have personally reviewed the radiological images as listed and agreed with the findings in the report. No results found.  ASSESSMENT & PLAN:   84 y.o. male with  1. Diffuse Large B-Cell Lymphoma -newly diagnosed presenting as a mass in the left medial gluteal region/perineum s/p resection. -03/11/18 Surgical bx which revealed Diffuse Large B-Cell Non-Hodgkin's Lymphoma  -04/13/18 ECHO revealed LV EF of 60-65%  -No Hep B, Hep C or HIV from 03/24/18 labs -04/18/18 PET/CT which revealed Mildly hypermetabolic small to borderline enlarged abdominal and pelvic retroperitoneal lymph nodes. Probable mild hypermetabolism within subcentimeter axillary lymph nodes, with misregistration on fused images. 2. Scattered lucent lesions in the iliac wings and right fifth rib. Continued attention on follow-up exams is warranted. 3. Scattered small pulmonary nodules are nonspecific and too small for PET resolution. Continued attention on follow-up  exams is warranted. 4. Intermediate density lesion deep to the left gluteal fold, with associated hypermetabolism, which may represent a complex cyst or abscess. 5. Aortic atherosclerosis. Coronary artery calcification. 6. Left renal stone. -Treatment plan include R-CHOP for 6 cycles starting 05/10/18   PLAN:  -Pt notes he is doing well living alone. He notes has a DNR in place at home. He has good support from his niece for who he speaks with daily.  -Avoid raw foods during treatment. Wear a mask when doing yard work. -Discussed pt labwork today, 07/07/18; bloods and chemistries are stable. LDH is normal at 185.  -Proceed with PET/CT -Recommend sucking on ice chips while receiving Doxorubicin  -The pt has no prohibitive toxicities from continuing C4 R-CHOP with G-CSF support at this time.    2.  Patient Active Problem List   Diagnosis Date Noted  . Diffuse large B-cell lymphoma ([place]) 03/30/2018  . Perineal mass, male 03/11/2018  . Perineal mass in male  03/08/2018  . Degeneration of lumbar intervertebral disc 10/05/2017  . History of total hip arthroplasty 10/05/2017  . Combined forms of age-related cataract of both eyes 04/12/2014  . OA (osteoarthritis) of hip 05/31/2013  . History of revision of total replacement of left hip joint 05/31/2013  . High risk medication use 10/25/2012  . Vitreomacular adhesion 05/24/2012  . Posterior vitreous detachment 04/12/2012  . Cystoid macular edema 11/10/2011  . Subretinal neovascularization of macula 09/11/2011  . Retinal pigment epithelial detachment, bilateral 09/11/2011  . Macular degeneration of both eyes 06/09/2011  . History of thyroid cancer 04/21/2011  . Macular degeneration 01/05/2011  . CARCINOMA, THYROID GLAND, PAPILLARY 11/20/2008  . HYPOTHYROIDISM, POSTSURGICAL 11/20/2008  . Osteoarthritis 07/01/2007  . LOW BACK PAIN 07/01/2007  . Uric acid renal calculus 06/20/2007  . Essential hypertension 06/20/2007   -continue f/u with PCP   PET/CT still needs scheduling in the next 2-3 weeks F/u for C4 of R-CHOP with neulasta on 07/12/2018 as scheduled F/u for C5 of R-CHOP with neulasta on 08/02/2018 with labs and MD visit as scheduled     All of the patients questions were answered with apparent satisfaction. The patient knows to call the clinic with any problems, questions or concerns.  The total time spent in the appt was 25 minutes and more than 50% was on counseling and direct patient cares.    Sullivan Lone MD MS AAHIVMS [facility] Hematology/Oncology Physician [facility]  (Office):       [phone] (Work cell):  [phone] (Fax):           [phone]  07/07/2018 1:49 PM  I, Baldwin [place], am acting as a scribe for Dr. Irene Limbo  .I have reviewed the above documentation for accuracy and completeness, and I agree with the above. Brunetta Genera MD

## 2018-07-07 ENCOUNTER — Inpatient Hospital Stay: Payer: Medicare PPO

## 2018-07-07 ENCOUNTER — Inpatient Hospital Stay: Payer: Medicare PPO | Admitting: Hematology

## 2018-07-07 VITALS — BP 120/52 | HR 73 | Temp 98.5°F | Resp 16 | Ht 64.0 in | Wt 175.6 lb

## 2018-07-07 DIAGNOSIS — Z79899 Other long term (current) drug therapy: Secondary | ICD-10-CM | POA: Diagnosis not present

## 2018-07-07 DIAGNOSIS — R918 Other nonspecific abnormal finding of lung field: Secondary | ICD-10-CM | POA: Diagnosis not present

## 2018-07-07 DIAGNOSIS — I2510 Atherosclerotic heart disease of native coronary artery without angina pectoris: Secondary | ICD-10-CM | POA: Diagnosis not present

## 2018-07-07 DIAGNOSIS — Z5112 Encounter for antineoplastic immunotherapy: Secondary | ICD-10-CM | POA: Diagnosis not present

## 2018-07-07 DIAGNOSIS — I700 Atherosclerosis of aorta: Secondary | ICD-10-CM

## 2018-07-07 DIAGNOSIS — Z5111 Encounter for antineoplastic chemotherapy: Secondary | ICD-10-CM | POA: Diagnosis not present

## 2018-07-07 DIAGNOSIS — E039 Hypothyroidism, unspecified: Secondary | ICD-10-CM | POA: Diagnosis not present

## 2018-07-07 DIAGNOSIS — Z66 Do not resuscitate: Secondary | ICD-10-CM

## 2018-07-07 DIAGNOSIS — C8330 Diffuse large B-cell lymphoma, unspecified site: Secondary | ICD-10-CM | POA: Diagnosis not present

## 2018-07-07 DIAGNOSIS — H3530 Unspecified macular degeneration: Secondary | ICD-10-CM

## 2018-07-07 DIAGNOSIS — Z7689 Persons encountering health services in other specified circumstances: Secondary | ICD-10-CM | POA: Diagnosis not present

## 2018-07-07 DIAGNOSIS — I10 Essential (primary) hypertension: Secondary | ICD-10-CM

## 2018-07-07 DIAGNOSIS — Z87891 Personal history of nicotine dependence: Secondary | ICD-10-CM

## 2018-07-07 DIAGNOSIS — M67431 Ganglion, right wrist: Secondary | ICD-10-CM | POA: Diagnosis not present

## 2018-07-07 DIAGNOSIS — Z85850 Personal history of malignant neoplasm of thyroid: Secondary | ICD-10-CM

## 2018-07-07 DIAGNOSIS — Z87442 Personal history of urinary calculi: Secondary | ICD-10-CM

## 2018-07-07 LAB — CBC WITH DIFFERENTIAL/PLATELET
Abs Immature Granulocytes: 0.26 10*3/uL — ABNORMAL HIGH (ref 0.00–0.07)
BASOS ABS: 0.1 10*3/uL (ref 0.0–0.1)
Basophils Relative: 1 %
Eosinophils Absolute: 0.2 10*3/uL (ref 0.0–0.5)
Eosinophils Relative: 2 %
HCT: 32.7 % — ABNORMAL LOW (ref 39.0–52.0)
HEMOGLOBIN: 10.9 g/dL — AB (ref 13.0–17.0)
Immature Granulocytes: 3 %
LYMPHS PCT: 12 %
Lymphs Abs: 1.1 10*3/uL (ref 0.7–4.0)
MCH: 30.7 pg (ref 26.0–34.0)
MCHC: 33.3 g/dL (ref 30.0–36.0)
MCV: 92.1 fL (ref 80.0–100.0)
Monocytes Absolute: 0.7 10*3/uL (ref 0.1–1.0)
Monocytes Relative: 8 %
NRBC: 0 % (ref 0.0–0.2)
Neutro Abs: 6.6 10*3/uL (ref 1.7–7.7)
Neutrophils Relative %: 74 %
Platelets: 224 10*3/uL (ref 150–400)
RBC: 3.55 MIL/uL — AB (ref 4.22–5.81)
RDW: 16.4 % — ABNORMAL HIGH (ref 11.5–15.5)
WBC: 9 10*3/uL (ref 4.0–10.5)

## 2018-07-07 LAB — CMP (CANCER CENTER ONLY)
ALBUMIN: 3.2 g/dL — AB (ref 3.5–5.0)
ALT: 12 U/L (ref 0–44)
ANION GAP: 7 (ref 5–15)
AST: 14 U/L — ABNORMAL LOW (ref 15–41)
Alkaline Phosphatase: 102 U/L (ref 38–126)
BUN: 14 mg/dL (ref 8–23)
CO2: 27 mmol/L (ref 22–32)
Calcium: 9 mg/dL (ref 8.9–10.3)
Chloride: 108 mmol/L (ref 98–111)
Creatinine: 0.85 mg/dL (ref 0.61–1.24)
GFR, Est AFR Am: >60 mL/min (ref >60)
GFR, Estimated: >60 mL/min (ref >60)
Glucose, Bld: 107 mg/dL — ABNORMAL HIGH (ref 70–99)
POTASSIUM: 4.9 mmol/L (ref 3.5–5.1)
Sodium: 142 mmol/L (ref 135–145)
Total Bilirubin: 0.3 mg/dL (ref 0.3–1.2)
Total Protein: 6.4 g/dL — ABNORMAL LOW (ref 6.5–8.1)

## 2018-07-07 LAB — LACTATE DEHYDROGENASE: LDH: 185 U/L (ref 98–192)

## 2018-07-07 NOTE — Patient Instructions (Signed)
Thank you for choosing [facility] to provide your oncology and hematology care.  To afford each patient quality time with our providers, please arrive 30 minutes before your scheduled appointment time.  If you arrive late for your appointment, you may be asked to reschedule.  We strive to give you quality time with our providers, and arriving late affects you and other patients whose appointments are after yours.    If you are a no show for multiple scheduled visits, you may be dismissed from the clinic at the providers discretion.     Again, thank you for choosing [facility], our hope is that these requests will decrease the amount of time that you wait before being seen by our physicians.  ______________________________________________________________________   Should you have questions after your visit to the [facility], please contact our office at [phone] between the hours of 8:30 and 4:30 p.m.    Voicemails left after 4:30p.m will not be returned until the following business day.     For prescription refill requests, please have your pharmacy contact us directly.  Please also try to allow 48 hours for prescription requests.     Please contact the scheduling department for questions regarding scheduling.  For scheduling of procedures such as PET scans, CT scans, MRI, Ultrasound, etc please contact central scheduling at [phone].     Resources For Cancer Patients and Caregivers:    Oncolink.org:  A wonderful resource for patients and healthcare providers for information regarding your disease, ways to tract your treatment, what to expect, etc.      American Cancer Society:  800-227-2345  Can help patients locate various types of support and financial assistance   Cancer Care: 1-800-813-HOPE (4673) Provides financial assistance, online support groups, medication/co-pay assistance.     [place] DSS:  336-641-3447 Where to apply  for food stamps, Medicaid, and utility assistance   Medicare Rights Center: 800-333-4114 Helps people with Medicare understand their rights and benefits, navigate the Medicare system, and secure the quality healthcare they deserve   SCAT: 336-333-6589 [place] [employer]'s shared-ride transportation service for eligible riders who have a disability that prevents them from riding the fixed route bus.     For additional information on assistance programs please contact our social worker:   Abigail Elmore:  [phone]  

## 2018-07-11 ENCOUNTER — Ambulatory Visit (HOSPITAL_COMMUNITY)
Admission: RE | Admit: 2018-07-11 | Discharge: 2018-07-11 | Disposition: A | Discharge status: Home / Self Care | Payer: Medicare PPO | Source: Ambulatory Visit | Attending: Hematology | Admitting: Hematology

## 2018-07-11 DIAGNOSIS — C8330 Diffuse large B-cell lymphoma, unspecified site: Secondary | ICD-10-CM | POA: Diagnosis not present

## 2018-07-11 DIAGNOSIS — N200 Calculus of kidney: Secondary | ICD-10-CM | POA: Insufficient documentation

## 2018-07-11 DIAGNOSIS — I700 Atherosclerosis of aorta: Secondary | ICD-10-CM | POA: Insufficient documentation

## 2018-07-11 LAB — GLUCOSE, CAPILLARY: Glucose-Capillary: 110 mg/dL — ABNORMAL HIGH (ref 70–99)

## 2018-07-11 MED ORDER — FLUDEOXYGLUCOSE F - 18 (FDG) INJECTION
8.2000 | Freq: Once | INTRAVENOUS | Status: AC | PRN
  Administered 2018-07-11: 8.2 via INTRAVENOUS

## 2018-07-12 ENCOUNTER — Other Ambulatory Visit: Payer: Medicare PPO

## 2018-07-12 ENCOUNTER — Telehealth: Payer: Self-pay

## 2018-07-12 ENCOUNTER — Inpatient Hospital Stay: Payer: Medicare PPO | Attending: Hematology

## 2018-07-12 ENCOUNTER — Ambulatory Visit: Payer: Medicare PPO | Admitting: Hematology

## 2018-07-12 VITALS — BP 126/83 | HR 79 | Temp 97.8°F | Resp 16 | Wt 172.5 lb

## 2018-07-12 DIAGNOSIS — Z7689 Persons encountering health services in other specified circumstances: Secondary | ICD-10-CM | POA: Diagnosis not present

## 2018-07-12 DIAGNOSIS — Z5112 Encounter for antineoplastic immunotherapy: Secondary | ICD-10-CM | POA: Diagnosis not present

## 2018-07-12 DIAGNOSIS — C8330 Diffuse large B-cell lymphoma, unspecified site: Secondary | ICD-10-CM

## 2018-07-12 DIAGNOSIS — Z5111 Encounter for antineoplastic chemotherapy: Secondary | ICD-10-CM | POA: Diagnosis not present

## 2018-07-12 MED ORDER — DOXORUBICIN HCL CHEMO IV INJECTION 2 MG/ML
50.0000 mg/m2 | Freq: Once | INTRAVENOUS | Status: AC
  Administered 2018-07-12: 96 mg via INTRAVENOUS
  Filled 2018-07-12: qty 48

## 2018-07-12 MED ORDER — SODIUM CHLORIDE 0.9 % IV SOLN
375.0000 mg/m2 | Freq: Once | INTRAVENOUS | Status: AC
  Administered 2018-07-12: 700 mg via INTRAVENOUS
  Filled 2018-07-12: qty 50

## 2018-07-12 MED ORDER — SODIUM CHLORIDE 0.9 % IV SOLN
750.0000 mg/m2 | Freq: Once | INTRAVENOUS | Status: AC
  Administered 2018-07-12: 1440 mg via INTRAVENOUS
  Filled 2018-07-12: qty 72

## 2018-07-12 MED ORDER — VINCRISTINE SULFATE CHEMO INJECTION 1 MG/ML
2.0000 mg | Freq: Once | INTRAVENOUS | Status: AC
  Administered 2018-07-12: 2 mg via INTRAVENOUS
  Filled 2018-07-12: qty 2

## 2018-07-12 MED ORDER — HEPARIN SOD (PORK) LOCK FLUSH 100 UNIT/ML IV SOLN
500.0000 [IU] | Freq: Once | INTRAVENOUS | Status: AC | PRN
  Administered 2018-07-12: 500 [IU]
  Filled 2018-07-12: qty 5

## 2018-07-12 MED ORDER — ACETAMINOPHEN 325 MG PO TABS
650.0000 mg | ORAL_TABLET | Freq: Once | ORAL | Status: AC
  Administered 2018-07-12: 650 mg via ORAL

## 2018-07-12 MED ORDER — DEXAMETHASONE SODIUM PHOSPHATE 10 MG/ML IJ SOLN
10.0000 mg | Freq: Once | INTRAMUSCULAR | Status: AC
  Administered 2018-07-12: 10 mg via INTRAVENOUS

## 2018-07-12 MED ORDER — ACETAMINOPHEN 325 MG PO TABS
ORAL_TABLET | ORAL | Status: AC
  Filled 2018-07-12: qty 2

## 2018-07-12 MED ORDER — SODIUM CHLORIDE 0.9% FLUSH
10.0000 mL | INTRAVENOUS | Status: DC | PRN
  Administered 2018-07-12: 10 mL
  Filled 2018-07-12: qty 10

## 2018-07-12 MED ORDER — PALONOSETRON HCL INJECTION 0.25 MG/5ML
0.2500 mg | Freq: Once | INTRAVENOUS | Status: AC
  Administered 2018-07-12: 0.25 mg via INTRAVENOUS

## 2018-07-12 MED ORDER — SODIUM CHLORIDE 0.9 % IV SOLN
Freq: Once | INTRAVENOUS | Status: AC
  Administered 2018-07-12: 10:00:00 via INTRAVENOUS
  Filled 2018-07-12: qty 250

## 2018-07-12 MED ORDER — PALONOSETRON HCL INJECTION 0.25 MG/5ML
INTRAVENOUS | Status: AC
  Filled 2018-07-12: qty 5

## 2018-07-12 MED ORDER — DIPHENHYDRAMINE HCL 25 MG PO CAPS
ORAL_CAPSULE | ORAL | Status: AC
  Filled 2018-07-12: qty 2

## 2018-07-12 MED ORDER — DEXAMETHASONE SODIUM PHOSPHATE 10 MG/ML IJ SOLN
INTRAMUSCULAR | Status: AC
  Filled 2018-07-12: qty 1

## 2018-07-12 MED ORDER — DIPHENHYDRAMINE HCL 25 MG PO CAPS
50.0000 mg | ORAL_CAPSULE | Freq: Once | ORAL | Status: AC
  Administered 2018-07-12: 50 mg via ORAL

## 2018-07-12 NOTE — Patient Instructions (Signed)
[facility] Discharge Instructions for Patients Receiving Chemotherapy  Today you received the following chemotherapy agents Adriamycin, Cytoxan, Vincristine, and Rituxan.   To help prevent nausea and vomiting after your treatment, we encourage you to take your nausea medication as directed.   If you develop nausea and vomiting that is not controlled by your nausea medication, call the clinic.   BELOW ARE SYMPTOMS THAT SHOULD BE REPORTED IMMEDIATELY:  *FEVER GREATER THAN 100.5 F  *CHILLS WITH OR WITHOUT FEVER  NAUSEA AND VOMITING THAT IS NOT CONTROLLED WITH YOUR NAUSEA MEDICATION  *UNUSUAL SHORTNESS OF BREATH  *UNUSUAL BRUISING OR BLEEDING  TENDERNESS IN MOUTH AND THROAT WITH OR WITHOUT PRESENCE OF ULCERS  *URINARY PROBLEMS  *BOWEL PROBLEMS  UNUSUAL RASH Items with * indicate a potential emergency and should be followed up as soon as possible.  Feel free to call the clinic should you have any questions or concerns. The clinic phone number is [phone].  Please show the CHEMO ALERT CARD at check-in to the Emergency Department and triage nurse.   

## 2018-07-12 NOTE — Telephone Encounter (Signed)
Record received 07/10/18 at 1540 noting that pt called in 07/10/18 at 1345 regarding concern of constipation in the middle of the night at 0200. Per record pt stated, "he is constipated. Last BM was yesterday. Still feels constipated. He was having stomac pain that is now gone. Urine is pale yellow. Denies fever and other symptoms. Is drinking fluids and voiding. Has a PET Scan scheduled for tomorrow." Burna Sis, RN gave pt care advice to eat diet high fiber with fruits and vegetables at each meal. Pt to drink 6-8 glasses of water a day. Pt to have regular exercise. Standing orders placed:  -milk of magnesia 22mL per dose -dulcolax 0.5mg  to take if no response to MOM (2 tablets for 2 days) -senekot-s if no response to dulcolax (1 tablet)

## 2018-07-13 ENCOUNTER — Ambulatory Visit (HOSPITAL_COMMUNITY): Admission: RE | Admit: 2018-07-13 | Payer: Self-pay | Source: Ambulatory Visit

## 2018-07-14 ENCOUNTER — Inpatient Hospital Stay: Payer: Medicare PPO

## 2018-07-14 VITALS — BP 112/56 | HR 83 | Temp 98.2°F | Resp 18

## 2018-07-14 DIAGNOSIS — C8330 Diffuse large B-cell lymphoma, unspecified site: Secondary | ICD-10-CM | POA: Diagnosis not present

## 2018-07-14 DIAGNOSIS — Z7689 Persons encountering health services in other specified circumstances: Secondary | ICD-10-CM | POA: Diagnosis not present

## 2018-07-14 DIAGNOSIS — Z5111 Encounter for antineoplastic chemotherapy: Secondary | ICD-10-CM | POA: Diagnosis not present

## 2018-07-14 DIAGNOSIS — Z5112 Encounter for antineoplastic immunotherapy: Secondary | ICD-10-CM | POA: Diagnosis not present

## 2018-07-14 MED ORDER — PEGFILGRASTIM-CBQV 6 MG/0.6ML ~~LOC~~ SOSY
6.0000 mg | PREFILLED_SYRINGE | Freq: Once | SUBCUTANEOUS | Status: AC
  Administered 2018-07-14: 6 mg via SUBCUTANEOUS

## 2018-07-14 MED ORDER — PEGFILGRASTIM-CBQV 6 MG/0.6ML ~~LOC~~ SOSY
PREFILLED_SYRINGE | SUBCUTANEOUS | Status: AC
  Filled 2018-07-14: qty 0.6

## 2018-07-28 DIAGNOSIS — H35371 Puckering of macula, right eye: Secondary | ICD-10-CM | POA: Diagnosis not present

## 2018-07-28 DIAGNOSIS — H353231 Exudative age-related macular degeneration, bilateral, with active choroidal neovascularization: Secondary | ICD-10-CM | POA: Diagnosis not present

## 2018-07-28 DIAGNOSIS — H35361 Drusen (degenerative) of macula, right eye: Secondary | ICD-10-CM | POA: Diagnosis not present

## 2018-07-28 DIAGNOSIS — H353211 Exudative age-related macular degeneration, right eye, with active choroidal neovascularization: Secondary | ICD-10-CM | POA: Diagnosis not present

## 2018-08-01 NOTE — Progress Notes (Signed)
HEMATOLOGY/ONCOLOGY CLINIC NOTE  Date of Service: 08/02/2018  Patient Care Team: Billie Ruddy, MD as PCP - General (Family Medicine)  CHIEF COMPLAINTS/PURPOSE OF CONSULTATION:  Diffuse Large B-Cell Lymphoma  HISTORY OF PRESENTING ILLNESS:   Jay Webster is a wonderful 84 y.o. male who has been referred to [place] by surgeon Dr. Armandina Gemma for evaluation and management of Diffuse Large B-Cell Lymphoma. The pt reports that he is doing well overall.   Of note prior to the patient's visit today, pt has had Perineum biopsy completed on 03/11/18 with results revealing Diffuse Large B-Cell Lymphoma. The pt had surgery to remove his irregularly shaped mass in the left anterior portion of the perineum with Dr. Armandina Gemma.  The pt reports first noticing the mass in his left buttock about 2 months ago, after a visit with Dr. Trena Platt. The pt decided to consult his surgeon Dr. Armandina Gemma whom he previously had a thyroidectomy with. The pt notes that the spot became as large as a golf ball and did not breach the skin. The pt notes that it wasn't terribly uncomfortable to sit on. He denies feeling any differently recently than in the past  6 months to a year.   The pt notes that he has been having some continued discharge and is using Depends. He is changing his gauze twice each day.   He denies any recent medical problems. He continues to function independently at home and lives alone, as his partner of 30+ years died in the past year. He notes that he does not have any family members or friends who he would want to include in his care. He denies any difficulties functioning day to day and denies concerns for his memory.   He has been on thyroid replacement for the last 10 years following a thyroidectomy after thyroid cancer, and was treated with one dose of radioactive iodine.   The pt also notes a uric acid kidney stone history which hasn't been a problem in many years.    Most  recent lab results (02/28/18) of CBC  is as follows: all values are WNL except for HCT at 38.3.  On review of systems, pt reports left buttock surgical wound, ganglion cyst on right wrist, and denies fevers, chills, night sweats, unexpected weight loss, new fatigue, pain along the spine, leg swelling, noticing any new lumps or bumps, and any other symptoms.   On PMHx the pt denies heart problems, strokes, abdominal problems, lung problems.  On Social Hx the pt reports that he quit smoking in 1990, and used to smoke 1-2 packs of cigarettes each day before this. He denies excess alcohol being a problem, and consumes one glass of wine rarely. 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  On Family Hx the pt reports brother died of sarcoma, different brother with Type I DM, and denies other cancer or blood disorders.  Interval History:    Jay Webster returns today for management, evaluation, prior to C5 R-CHOP treatment of his recently diagnosed Diffuse Large B-Cell Lymphoma. The patient's last visit with [place] was on 07/07/18. The pt reports that he is doing well overall.   The pt reports that he has tolerated treatment very well and has no new complaints or concerns. The pt notes that he has not seen any changes in her perineal area of initial involvement. He notes that he continues moving his bowels, but does endorse  soft stools which he attributes to fiber.   The pt notes frequent urination, and endorses feeling that he empties his bladder well. He notes that he is drinking about 1 liter of water each day.   The pt notes that he has continued eating well.  Of note since the patient's last visit, pt has had a PET/CT  completed on 07/11/18 with results revealing No evidence of recurrent lymphoma. 2. Left renal stone. 3. Aortic atherosclerosis. Coronary artery calcification.  Lab results today (08/02/18) of CBC w/diff and CMP is as follows: all values are WNL  except for RBC at 3.35, HGB at 10.4, HCT at 30.7, RDW at 16.3, Total Protein at 6.4, Albumin at 3.3. 08/02/18 LDH is pending  On review of systems, pt reports eating well, stable energy levels, moving his bowels well, and denies nausea, vomiting, diarrhea, fevers, chills, night sweats, perineal pain or discomfort, mouth sores, skin rashes, bone pains, and any other symptoms.   MEDICAL HISTORY:  Past Medical History:  Diagnosis Date  . Arthritis    OA AND PAIN LEFT HIP AND OTHER JOINT PAINS  . Cancer (HCC)    THYROID CANCER - HAD THYROID REMOVED  . History of kidney stones   . History of shingles    RESOLVED  . Hypertension   . Hypothyroidism   . Low back pain   . Macular degeneration of both eyes   . RBBB (right bundle branch block with left anterior fascicular block)   . Seasonal allergies   . Thyroid disease   . Uric acid renal calculus 06/20/2007   Qualifier: Diagnosis of  By: Rogue Bussing CMA, Maryann Alar      SURGICAL HISTORY: Past Surgical History:  Procedure Laterality Date  . APPENDECTOMY  1948  . COLONOSCOPY    . Elbow Radical Reduction  1973  . IR IMAGING GUIDED PORT INSERTION  05/05/2018  . [place]  . MASS EXCISION N/A 03/11/2018   Procedure: EXCISION MASS OF PERINEUM;  Surgeon: Armandina Gemma, MD;  Location: [facility];  Service: General;  Laterality: N/A;  . REMOVAL OF FIRST RIB   NOV 1994   FOR COMPRESSED VEIN BETWEEN 1 ST RIB AND COLLARBONE  . Rib removed, 1st  1994  . ROTATOR CUFF REPAIR  2009  . THYROIDECTOMY  2010  . TOTAL HIP ARTHROPLASTY Left 05/31/2013   Procedure: LEFT TOTAL HIP ARTHROPLASTY ANTERIOR APPROACH;  Surgeon: Gearlean Alf, MD;  Location: [facility];  Service: Orthopedics;  Laterality: Left;    SOCIAL HISTORY: Social History   Socioeconomic History  . Marital status: Widowed    Spouse name: Not on file  . Number of children: Not on file  . Years of education: Not on file  . Highest education level: Not on file    Occupational History  . Occupation: retired  Scientific laboratory technician  . Financial resource strain: Not on file  . Food insecurity:    Worry: Not on file    Inability: Not on file  . Transportation needs:    Medical: Not on file    Non-medical: Not on file  Tobacco Use  . Smoking status: Former Smoker    Types: Cigarettes    Last attempt to quit: 02/22/1979    Years since quitting: 39.4  . Smokeless tobacco: Never Used  Substance and Sexual Activity  . Alcohol use: Yes    Alcohol/week: 0.0 standard drinks  . Drug use: No  . Sexual activity: Yes  Lifestyle  . Physical  activity:    Days per week: Not on file    Minutes per session: Not on file  . Stress: Not on file  Relationships  . Social connections:    Talks on phone: Not on file    Gets together: Not on file    Attends religious service: Not on file    Active member of club or organization: Not on file    Attends meetings of clubs or organizations: Not on file    Relationship status: Not on file  . Intimate partner violence:    Fear of current or ex partner: Not on file    Emotionally abused: Not on file    Physically abused: Not on file    Forced sexual activity: Not on file  Other Topics Concern  . Not on file  Social History Narrative  . Not on file    FAMILY HISTORY: Family History  Problem Relation Age of Onset  . Heart disease Mother   . Diabetes Mother   . Anemia Unknown   . Thyroid disease Unknown     ALLERGIES:  is allergic to penicillins.  MEDICATIONS:  Current Outpatient Medications  Medication Sig Dispense Refill  . acetaminophen (TYLENOL) 325 MG tablet Take 325-650 mg by mouth daily as needed for headache.    . Aflibercept (EYLEA) 2 MG/0.05ML SOLN Inject 1 Dose into the eye every 8 (eight) weeks.     Marland Kitchen allopurinol (ZYLOPRIM) 300 MG tablet TAKE 1 TABLET EVERY DAY 90 tablet 1  . benazepril (LOTENSIN) 20 MG tablet TAKE 1 TABLET EVERY DAY 90 tablet 1  . cetirizine (ZYRTEC) 10 MG tablet Take 10 mg by  mouth daily as needed for allergies.    . Glucosamine-Chondroitin (OSTEO BI-FLEX REGULAR STRENGTH PO) Take 1 tablet by mouth 2 (two) times daily.    Marland Kitchen levothyroxine (SYNTHROID, LEVOTHROID) 125 MCG tablet Take 1 tablet (125 mcg total) by mouth daily. 30 tablet 0  . lidocaine-prilocaine (EMLA) cream Apply to affected area once 30 g 3  . Multiple Vitamin (MULTIVITAMIN) tablet Take 1 tablet by mouth daily.    . Multiple Vitamins-Minerals (PRESERVISION AREDS 2 PO) Take 1 tablet by mouth 2 (two) times daily.    . ondansetron (ZOFRAN) 8 MG tablet Take 1 tablet (8 mg total) by mouth 2 (two) times daily as needed for refractory nausea / vomiting. Start on day 3 after chemotherapy. 30 tablet 1  . Polyethyl Glycol-Propyl Glycol (SYSTANE OP) Place 1 drop into both eyes daily as needed (irritation).    . predniSONE (DELTASONE) 20 MG tablet Take 3 tablets (60 mg total) by mouth daily. Take on days 1-5 of chemotherapy. 15 tablet 6  . prochlorperazine (COMPAZINE) 10 MG tablet Take 1 tablet (10 mg total) by mouth every 6 (six) hours as needed (Nausea or vomiting). (Patient not taking: Reported on 05/31/2018) 30 tablet 6  . sodium chloride (OCEAN) 0.65 % SOLN nasal spray Place 1 spray into both nostrils as needed for congestion.     No current facility-administered medications for this visit.     REVIEW OF SYSTEMS:    A 10+ POINT REVIEW OF SYSTEMS WAS OBTAINED including neurology, dermatology, psychiatry, cardiac, respiratory, lymph, extremities, GI, GU, Musculoskeletal, constitutional, breasts, reproductive, HEENT.  All pertinent positives are noted in the HPI.  All others are negative.   PHYSICAL EXAMINATION: ECOG PERFORMANCE STATUS: 2 - Symptomatic, <50% confined to bed  Vitals:   08/02/18 0910  BP: 115/64  Pulse: 71  Resp: 18  Temp: 97.8  F (36.6 C)  SpO2: 100%   Filed Weights   08/02/18 0910  Weight: 173 lb 8 oz (78.7 kg)   .Body mass index is 29.78 kg/m.  GENERAL:alert, in no acute distress  and comfortable SKIN: no acute rashes, no significant lesions EYES: conjunctiva are pink and non-injected, sclera anicteric OROPHARYNX: MMM, no exudates, no oropharyngeal erythema or ulceration NECK: supple, no JVD LYMPH:  no palpable lymphadenopathy in the cervical, axillary or inguinal regions LUNGS: clear to auscultation b/l with normal respiratory effort HEART: regular rate & rhythm ABDOMEN:  normoactive bowel sounds , non tender, not distended. No palpable hepatosplenomegaly.  Extremity: no pedal edema PSYCH: alert & oriented x 3 with fluent speech NEURO: no focal motor/sensory deficits   LABORATORY DATA:  I have reviewed the data as listed  . CBC Latest Ref Rng & Units 08/02/2018 07/07/2018 06/21/2018  WBC 4.0 - 10.5 K/uL 6.0 9.0 5.8  Hemoglobin 13.0 - 17.0 g/dL 10.4(L) 10.9(L) 10.8(L)  Hematocrit 39.0 - 52.0 % 30.7(L) 32.7(L) 32.2(L)  Platelets 150 - 400 K/uL 260 224 251   . CBC    Component Value Date/Time   WBC 6.0 08/02/2018 0828   WBC 9.0 07/07/2018 1219   RBC 3.35 (L) 08/02/2018 0828   HGB 10.4 (L) 08/02/2018 0828   HCT 30.7 (L) 08/02/2018 0828   PLT 260 08/02/2018 0828   MCV 91.6 08/02/2018 0828   MCH 31.0 08/02/2018 0828   MCHC 33.9 08/02/2018 0828   RDW 16.3 (H) 08/02/2018 0828   LYMPHSABS 0.8 08/02/2018 0828   MONOABS 0.8 08/02/2018 0828   EOSABS 0.1 08/02/2018 0828   BASOSABS 0.1 08/02/2018 0828    CMP Latest Ref Rng & Units 08/02/2018 07/07/2018 06/21/2018  Glucose 70 - 99 mg/dL 98 107(H) 104(H)  BUN 8 - 23 mg/dL 19 14 16   Creatinine 0.61 - 1.24 mg/dL 0.83 0.85 0.89  Sodium 135 - 145 mmol/L 141 142 143  Potassium 3.5 - 5.1 mmol/L 4.3 4.9 4.3  Chloride 98 - 111 mmol/L 109 108 110  CO2 22 - 32 mmol/L 25 27 28   Calcium 8.9 - 10.3 mg/dL 9.0 9.0 9.2  Total Protein 6.5 - 8.1 g/dL 6.4(L) 6.4(L) 6.4(L)  Total Bilirubin 0.3 - 1.2 mg/dL 0.3 0.3 0.4  Alkaline Phos 38 - 126 U/L 85 102 92  AST 15 - 41 U/L 15 14(L) 17  ALT 0 - 44 U/L 12 12 17    . Lab  Results  Component Value Date   LDH 185 07/07/2018   Component     Latest Ref Rng & Units 03/24/2018  LDH     98 - 192 U/L 185  HCV Ab     0.0 - 0.9 s/co ratio 0.2  Hep B Core Ab, Tot     Negative Negative  Hepatitis B Surface Ag     Negative Negative  HIV Screen 4th Generation wRfx     Non Reactive Non Reactive    03/11/18 Perineum Bx:    RADIOGRAPHIC STUDIES: I have personally reviewed the radiological images as listed and agreed with the findings in the report. Nm Pet Image Restag (ps) Skull Base To Thigh  Result Date: 07/11/2018 CLINICAL DATA:  Subsequent treatment strategy for large B-cell lymphoma. EXAM: NUCLEAR MEDICINE PET SKULL BASE TO THIGH TECHNIQUE: 8.2 mCi F-18 FDG was injected intravenously. Full-ring PET imaging was performed from the skull base to thigh after the radiotracer. CT data was obtained and used for attenuation correction and anatomic localization. Fasting blood glucose: 110  mg/dl COMPARISON:  04/18/2018. FINDINGS: Mediastinal blood pool activity: SUV max 2.7 NECK: No hypermetabolic lymph nodes. Incidental CT findings: None. CHEST: No hypermetabolic mediastinal, hilar or axillary lymph nodes. No hypermetabolic pulmonary nodules. Incidental CT findings: Right IJ Port-A-Cath terminates in the right atrium. Atherosclerotic calcification of the arterial vasculature, including coronary arteries. No pericardial or pleural effusion. 5 mm nodule along the right major fissure, stable. Additional scattered pulmonary nodules measure 4 mm or less in size, also stable. ABDOMEN/PELVIS: No abnormal hypermetabolism in the liver, adrenal glands, spleen or pancreas. No hypermetabolic lymph nodes. Incidental CT findings: Subcentimeter low-attenuation lesion in the right hepatic lobe is stable but too small to characterize. Visualized portions of the liver, gallbladder and adrenal glands are unremarkable. Small hyperattenuating lesions in the kidneys are difficult to further characterize  due to size and/or lack of post-contrast imaging but appear grossly stable. Left renal stone. Spleen is normal in size. Pancreas, stomach and bowel are grossly unremarkable. SKELETON: No abnormal osseous hypermetabolism. Incidental CT findings: Scattered lucent lesions in the right fifth rib and iliac wings are unchanged. Left hip arthroplasty. Degenerative changes in the spine. IMPRESSION: 1. No evidence of recurrent lymphoma. 2. Left renal stone. 3. Aortic atherosclerosis (ICD10-170.0). Coronary artery calcification. Electronically Signed   By: Lorin Picket M.D.   On: 07/11/2018 08:49    ASSESSMENT & PLAN:   84 y.o. male with  1. Diffuse Large B-Cell Lymphoma -newly diagnosed presenting as a mass in the left medial gluteal region/perineum s/p resection. -03/11/18 Surgical bx which revealed Diffuse Large B-Cell Non-Hodgkin's Lymphoma  -04/13/18 ECHO revealed LV EF of 60-65%  -No Hep B, Hep C or HIV from 03/24/18 labs -04/18/18 PET/CT which revealed Mildly hypermetabolic small to borderline enlarged abdominal and pelvic retroperitoneal lymph nodes. Probable mild hypermetabolism within subcentimeter axillary lymph nodes, with misregistration on fused images. 2. Scattered lucent lesions in the iliac wings and right fifth rib. Continued attention on follow-up exams is warranted. 3. Scattered small pulmonary nodules are nonspecific and too small for PET resolution. Continued attention on follow-up exams is warranted. 4. Intermediate density lesion deep to the left gluteal fold, with associated hypermetabolism, which may represent a complex cyst or abscess. 5. Aortic atherosclerosis. Coronary artery calcification. 6. Left renal stone. -Treatment plan include R-CHOP for 6 cycles starting 05/10/18   PLAN:  -Pt notes he is doing well living alone. He notes has a DNR in place at home. He has good support from his niece for who he speaks with daily.  -Avoid raw foods during treatment. Wear a mask when doing yard  work. -Recommend sucking on ice chips while receiving Doxorubicin  -Discussed pt labwork today, 08/02/18; blood counts and chemistries are stable, no neutropenia, no thrombocytopenia, mild anemia -Discussed the 07/11/18 PET/CT which revealed No evidence of recurrent lymphoma. 2. Left renal stone. 3. Aortic atherosclerosis. Coronary artery calcification.  -The pt has no prohibitive toxicities from continuing C5 R-CHOP with G-CSF support at this time.   -Will see the pt back in 3 weeks   2.  Patient Active Problem List   Diagnosis Date Noted  . Diffuse large B-cell lymphoma ([place]) 03/30/2018  . Perineal mass, male 03/11/2018  . Perineal mass in male 03/08/2018  . Degeneration of lumbar intervertebral disc 10/05/2017  . History of total hip arthroplasty 10/05/2017  . Combined forms of age-related cataract of both eyes 04/12/2014  . OA (osteoarthritis) of hip 05/31/2013  . History of revision of total replacement of left hip joint 05/31/2013  .  High risk medication use 10/25/2012  . Vitreomacular adhesion 05/24/2012  . Posterior vitreous detachment 04/12/2012  . Cystoid macular edema 11/10/2011  . Subretinal neovascularization of macula 09/11/2011  . Retinal pigment epithelial detachment, bilateral 09/11/2011  . Macular degeneration of both eyes 06/09/2011  . History of thyroid cancer 04/21/2011  . Macular degeneration 01/05/2011  . CARCINOMA, THYROID GLAND, PAPILLARY 11/20/2008  . HYPOTHYROIDISM, POSTSURGICAL 11/20/2008  . Osteoarthritis 07/01/2007  . LOW BACK PAIN 07/01/2007  . Uric acid renal calculus 06/20/2007  . Essential hypertension 06/20/2007   -continue f/u with PCP   Please schedule next cycle of R-CHOP with neulasta in 3 weeks with labs and MD visit     All of the patients questions were answered with apparent satisfaction. The patient knows to call the clinic with any problems, questions or concerns.  The total time spent in the appt was 25 minutes and more than 50%  was on counseling and direct patient cares.    Sullivan Lone MD MS AAHIVMS [facility] Hematology/Oncology Physician [facility]  (Office):       [phone] (Work cell):  [phone] (Fax):           [phone]  08/02/2018 10:10 AM  I, Baldwin [place], am acting as a scribe for Dr. Sullivan Lone.   .I have reviewed the above documentation for accuracy and completeness, and I agree with the above. Brunetta Genera MD

## 2018-08-02 ENCOUNTER — Encounter: Payer: Self-pay | Admitting: Hematology

## 2018-08-02 ENCOUNTER — Inpatient Hospital Stay: Payer: Medicare PPO

## 2018-08-02 ENCOUNTER — Inpatient Hospital Stay (HOSPITAL_BASED_OUTPATIENT_CLINIC_OR_DEPARTMENT_OTHER): Payer: Medicare PPO | Admitting: Hematology

## 2018-08-02 ENCOUNTER — Other Ambulatory Visit: Payer: Self-pay | Admitting: *Deleted

## 2018-08-02 VITALS — BP 120/62 | HR 84 | Temp 98.1°F | Resp 17

## 2018-08-02 VITALS — BP 115/64 | HR 71 | Temp 97.8°F | Resp 18 | Ht 64.0 in | Wt 173.5 lb

## 2018-08-02 DIAGNOSIS — C8330 Diffuse large B-cell lymphoma, unspecified site: Secondary | ICD-10-CM

## 2018-08-02 DIAGNOSIS — Z5112 Encounter for antineoplastic immunotherapy: Secondary | ICD-10-CM | POA: Diagnosis not present

## 2018-08-02 DIAGNOSIS — Z7689 Persons encountering health services in other specified circumstances: Secondary | ICD-10-CM | POA: Diagnosis not present

## 2018-08-02 DIAGNOSIS — Z5111 Encounter for antineoplastic chemotherapy: Secondary | ICD-10-CM | POA: Diagnosis not present

## 2018-08-02 LAB — CMP (CANCER CENTER ONLY)
ALBUMIN: 3.3 g/dL — AB (ref 3.5–5.0)
ALT: 12 U/L (ref 0–44)
AST: 15 U/L (ref 15–41)
Alkaline Phosphatase: 85 U/L (ref 38–126)
Anion gap: 7 (ref 5–15)
BUN: 19 mg/dL (ref 8–23)
CHLORIDE: 109 mmol/L (ref 98–111)
CO2: 25 mmol/L (ref 22–32)
CREATININE: 0.83 mg/dL (ref 0.61–1.24)
Calcium: 9 mg/dL (ref 8.9–10.3)
GFR, Estimated: >60 mL/min (ref >60)
Glucose, Bld: 98 mg/dL (ref 70–99)
Potassium: 4.3 mmol/L (ref 3.5–5.1)
SODIUM: 141 mmol/L (ref 135–145)
Total Bilirubin: 0.3 mg/dL (ref 0.3–1.2)
Total Protein: 6.4 g/dL — ABNORMAL LOW (ref 6.5–8.1)

## 2018-08-02 LAB — CBC WITH DIFFERENTIAL (CANCER CENTER ONLY)
ABS IMMATURE GRANULOCYTES: 0.06 10*3/uL (ref 0.00–0.07)
BASOS ABS: 0.1 10*3/uL (ref 0.0–0.1)
Basophils Relative: 1 %
EOS PCT: 1 %
Eosinophils Absolute: 0.1 10*3/uL (ref 0.0–0.5)
HEMATOCRIT: 30.7 % — AB (ref 39.0–52.0)
HEMOGLOBIN: 10.4 g/dL — AB (ref 13.0–17.0)
IMMATURE GRANULOCYTES: 1 %
LYMPHS ABS: 0.8 10*3/uL (ref 0.7–4.0)
LYMPHS PCT: 13 %
MCH: 31 pg (ref 26.0–34.0)
MCHC: 33.9 g/dL (ref 30.0–36.0)
MCV: 91.6 fL (ref 80.0–100.0)
Monocytes Absolute: 0.8 10*3/uL (ref 0.1–1.0)
Monocytes Relative: 14 %
NRBC: 0 % (ref 0.0–0.2)
Neutro Abs: 4.2 10*3/uL (ref 1.7–7.7)
Neutrophils Relative %: 70 %
Platelet Count: 260 10*3/uL (ref 150–400)
RBC: 3.35 MIL/uL — ABNORMAL LOW (ref 4.22–5.81)
RDW: 16.3 % — AB (ref 11.5–15.5)
WBC Count: 6 10*3/uL (ref 4.0–10.5)

## 2018-08-02 LAB — LACTATE DEHYDROGENASE: LDH: 169 U/L (ref 98–192)

## 2018-08-02 MED ORDER — DOXORUBICIN HCL CHEMO IV INJECTION 2 MG/ML
50.0000 mg/m2 | Freq: Once | INTRAVENOUS | Status: AC
  Administered 2018-08-02: 96 mg via INTRAVENOUS
  Filled 2018-08-02: qty 48

## 2018-08-02 MED ORDER — ACETAMINOPHEN 325 MG PO TABS
ORAL_TABLET | ORAL | Status: AC
  Filled 2018-08-02: qty 2

## 2018-08-02 MED ORDER — SODIUM CHLORIDE 0.9 % IV SOLN
750.0000 mg/m2 | Freq: Once | INTRAVENOUS | Status: AC
  Administered 2018-08-02: 1440 mg via INTRAVENOUS
  Filled 2018-08-02: qty 72

## 2018-08-02 MED ORDER — VINCRISTINE SULFATE CHEMO INJECTION 1 MG/ML
2.0000 mg | Freq: Once | INTRAVENOUS | Status: AC
  Administered 2018-08-02: 2 mg via INTRAVENOUS
  Filled 2018-08-02: qty 2

## 2018-08-02 MED ORDER — DIPHENHYDRAMINE HCL 25 MG PO CAPS
50.0000 mg | ORAL_CAPSULE | Freq: Once | ORAL | Status: AC
  Administered 2018-08-02: 50 mg via ORAL

## 2018-08-02 MED ORDER — SODIUM CHLORIDE 0.9 % IV SOLN
Freq: Once | INTRAVENOUS | Status: AC
  Administered 2018-08-02: 10:00:00 via INTRAVENOUS
  Filled 2018-08-02: qty 250

## 2018-08-02 MED ORDER — PALONOSETRON HCL INJECTION 0.25 MG/5ML
INTRAVENOUS | Status: AC
  Filled 2018-08-02: qty 5

## 2018-08-02 MED ORDER — DEXAMETHASONE SODIUM PHOSPHATE 10 MG/ML IJ SOLN
10.0000 mg | Freq: Once | INTRAMUSCULAR | Status: AC
  Administered 2018-08-02: 10 mg via INTRAVENOUS

## 2018-08-02 MED ORDER — DIPHENHYDRAMINE HCL 25 MG PO CAPS
ORAL_CAPSULE | ORAL | Status: AC
  Filled 2018-08-02: qty 2

## 2018-08-02 MED ORDER — SODIUM CHLORIDE 0.9% FLUSH
10.0000 mL | INTRAVENOUS | Status: DC | PRN
  Administered 2018-08-02: 10 mL
  Filled 2018-08-02: qty 10

## 2018-08-02 MED ORDER — PALONOSETRON HCL INJECTION 0.25 MG/5ML
0.2500 mg | Freq: Once | INTRAVENOUS | Status: AC
  Administered 2018-08-02: 0.25 mg via INTRAVENOUS

## 2018-08-02 MED ORDER — DEXAMETHASONE SODIUM PHOSPHATE 10 MG/ML IJ SOLN
INTRAMUSCULAR | Status: AC
  Filled 2018-08-02: qty 1

## 2018-08-02 MED ORDER — HEPARIN SOD (PORK) LOCK FLUSH 100 UNIT/ML IV SOLN
500.0000 [IU] | Freq: Once | INTRAVENOUS | Status: AC | PRN
  Administered 2018-08-02: 500 [IU]
  Filled 2018-08-02: qty 5

## 2018-08-02 MED ORDER — ACETAMINOPHEN 325 MG PO TABS
650.0000 mg | ORAL_TABLET | Freq: Once | ORAL | Status: AC
  Administered 2018-08-02: 650 mg via ORAL

## 2018-08-02 MED ORDER — SODIUM CHLORIDE 0.9 % IV SOLN
375.0000 mg/m2 | Freq: Once | INTRAVENOUS | Status: AC
  Administered 2018-08-02: 700 mg via INTRAVENOUS
  Filled 2018-08-02: qty 50

## 2018-08-02 NOTE — Patient Instructions (Signed)
Thank you for choosing [facility] to provide your oncology and hematology care.  To afford each patient quality time with our providers, please arrive 30 minutes before your scheduled appointment time.  If you arrive late for your appointment, you may be asked to reschedule.  We strive to give you quality time with our providers, and arriving late affects you and other patients whose appointments are after yours.    If you are a no show for multiple scheduled visits, you may be dismissed from the clinic at the providers discretion.     Again, thank you for choosing [facility], our hope is that these requests will decrease the amount of time that you wait before being seen by our physicians.  ______________________________________________________________________   Should you have questions after your visit to the [facility], please contact our office at [phone] between the hours of 8:30 and 4:30 p.m.    Voicemails left after 4:30p.m will not be returned until the following business day.     For prescription refill requests, please have your pharmacy contact us directly.  Please also try to allow 48 hours for prescription requests.     Please contact the scheduling department for questions regarding scheduling.  For scheduling of procedures such as PET scans, CT scans, MRI, Ultrasound, etc please contact central scheduling at [phone].     Resources For Cancer Patients and Caregivers:    Oncolink.org:  A wonderful resource for patients and healthcare providers for information regarding your disease, ways to tract your treatment, what to expect, etc.      American Cancer Society:  800-227-2345  Can help patients locate various types of support and financial assistance   Cancer Care: 1-800-813-HOPE (4673) Provides financial assistance, online support groups, medication/co-pay assistance.     [place] DSS:  336-641-3447 Where to apply  for food stamps, Medicaid, and utility assistance   Medicare Rights Center: 800-333-4114 Helps people with Medicare understand their rights and benefits, navigate the Medicare system, and secure the quality healthcare they deserve   SCAT: 336-333-6589 [place] [employer]'s shared-ride transportation service for eligible riders who have a disability that prevents them from riding the fixed route bus.     For additional information on assistance programs please contact our social worker:   Abigail Elmore:  [phone]  

## 2018-08-02 NOTE — Patient Instructions (Signed)
[facility] Discharge Instructions for Patients Receiving Chemotherapy  Today you received the following chemotherapy agents Adriamycin, Cytoxan, Vincristine, and Rituxan.   To help prevent nausea and vomiting after your treatment, we encourage you to take your nausea medication as directed.   If you develop nausea and vomiting that is not controlled by your nausea medication, call the clinic.   BELOW ARE SYMPTOMS THAT SHOULD BE REPORTED IMMEDIATELY:  *FEVER GREATER THAN 100.5 F  *CHILLS WITH OR WITHOUT FEVER  NAUSEA AND VOMITING THAT IS NOT CONTROLLED WITH YOUR NAUSEA MEDICATION  *UNUSUAL SHORTNESS OF BREATH  *UNUSUAL BRUISING OR BLEEDING  TENDERNESS IN MOUTH AND THROAT WITH OR WITHOUT PRESENCE OF ULCERS  *URINARY PROBLEMS  *BOWEL PROBLEMS  UNUSUAL RASH Items with * indicate a potential emergency and should be followed up as soon as possible.  Feel free to call the clinic should you have any questions or concerns. The clinic phone number is [phone].  Please show the [facility] at check-in to the Emergency Department and triage nurse.

## 2018-08-05 ENCOUNTER — Inpatient Hospital Stay: Payer: Medicare PPO

## 2018-08-05 ENCOUNTER — Telehealth: Payer: Self-pay | Admitting: *Deleted

## 2018-08-05 ENCOUNTER — Other Ambulatory Visit: Payer: Self-pay | Admitting: Hematology

## 2018-08-05 DIAGNOSIS — C8330 Diffuse large B-cell lymphoma, unspecified site: Secondary | ICD-10-CM

## 2018-08-05 DIAGNOSIS — Z5112 Encounter for antineoplastic immunotherapy: Secondary | ICD-10-CM | POA: Diagnosis not present

## 2018-08-05 DIAGNOSIS — Z5111 Encounter for antineoplastic chemotherapy: Secondary | ICD-10-CM | POA: Diagnosis not present

## 2018-08-05 DIAGNOSIS — Z7689 Persons encountering health services in other specified circumstances: Secondary | ICD-10-CM | POA: Diagnosis not present

## 2018-08-05 MED ORDER — PEGFILGRASTIM-CBQV 6 MG/0.6ML ~~LOC~~ SOSY
PREFILLED_SYRINGE | SUBCUTANEOUS | Status: AC
  Filled 2018-08-05: qty 1.8

## 2018-08-05 MED ORDER — GOSERELIN ACETATE 3.6 MG ~~LOC~~ IMPL
DRUG_IMPLANT | SUBCUTANEOUS | Status: AC
  Filled 2018-08-05: qty 3.6

## 2018-08-05 MED ORDER — PEGFILGRASTIM-CBQV 6 MG/0.6ML ~~LOC~~ SOSY
6.0000 mg | PREFILLED_SYRINGE | Freq: Once | SUBCUTANEOUS | Status: AC
  Administered 2018-08-05: 6 mg via SUBCUTANEOUS

## 2018-08-05 NOTE — Telephone Encounter (Signed)
Contacted patient to clarify appt scheduled for 08/22/18 is #6 of cycle of 6 treatments. Patient verbalized understanding.

## 2018-08-05 NOTE — Patient Instructions (Signed)
Pegfilgrastim injection What is this medicine? PEGFILGRASTIM (PEG fil gra stim) is a long-acting granulocyte colony-stimulating factor that stimulates the growth of neutrophils, a type of white blood cell important in the body's fight against infection. It is used to reduce the incidence of fever and infection in patients with certain types of cancer who are receiving chemotherapy that affects the bone marrow, and to increase survival after being exposed to high doses of radiation. This medicine may be used for other purposes; ask your health care provider or pharmacist if you have questions. COMMON BRAND NAME(S): Neulasta What should I tell my health care provider before I take this medicine? They need to know if you have any of these conditions: -kidney disease -latex allergy -ongoing radiation therapy -sickle cell disease -skin reactions to acrylic adhesives (On-Body Injector only) -an unusual or allergic reaction to pegfilgrastim, filgrastim, other medicines, foods, dyes, or preservatives -pregnant or trying to get pregnant -breast-feeding How should I use this medicine? This medicine is for injection under the skin. If you get this medicine at home, you will be taught how to prepare and give the pre-filled syringe or how to use the On-body Injector. Refer to the patient Instructions for Use for detailed instructions. Use exactly as directed. Tell your healthcare provider immediately if you suspect that the On-body Injector may not have performed as intended or if you suspect the use of the On-body Injector resulted in a missed or partial dose. It is important that you put your used needles and syringes in a special sharps container. Do not put them in a trash can. If you do not have a sharps container, call your pharmacist or healthcare provider to get one. Talk to your pediatrician regarding the use of this medicine in children. While this drug may be prescribed for selected conditions,  precautions do apply. Overdosage: If you think you have taken too much of this medicine contact a poison control center or emergency room at once. NOTE: This medicine is only for you. Do not share this medicine with others. What if I miss a dose? It is important not to miss your dose. Call your doctor or health care professional if you miss your dose. If you miss a dose due to an On-body Injector failure or leakage, a new dose should be administered as soon as possible using a single prefilled syringe for manual use. What may interact with this medicine? Interactions have not been studied. Give your health care provider a list of all the medicines, herbs, non-prescription drugs, or dietary supplements you use. Also tell them if you smoke, drink alcohol, or use illegal drugs. Some items may interact with your medicine. This list may not describe all possible interactions. Give your health care provider a list of all the medicines, herbs, non-prescription drugs, or dietary supplements you use. Also tell them if you smoke, drink alcohol, or use illegal drugs. Some items may interact with your medicine. What should I watch for while using this medicine? You may need blood work done while you are taking this medicine. If you are going to need a MRI, CT scan, or other procedure, tell your doctor that you are using this medicine (On-Body Injector only). What side effects may I notice from receiving this medicine? Side effects that you should report to your doctor or health care professional as soon as possible: -allergic reactions like skin rash, itching or hives, swelling of the face, lips, or tongue -dizziness -fever -pain, redness, or irritation at site   where injected -pinpoint red spots on the skin -red or dark-brown urine -shortness of breath or breathing problems -stomach or side pain, or pain at the shoulder -swelling -tiredness -trouble passing urine or change in the amount of urine Side  effects that usually do not require medical attention (report to your doctor or health care professional if they continue or are bothersome): -bone pain -muscle pain This list may not describe all possible side effects. Call your doctor for medical advice about side effects. You may report side effects to FDA at 1-800-FDA-1088. Where should I keep my medicine? Keep out of the reach of children. Store pre-filled syringes in a refrigerator between 2 and 8 degrees C (36 and 46 degrees F). Do not freeze. Keep in carton to protect from light. Throw away this medicine if it is left out of the refrigerator for more than 48 hours. Throw away any unused medicine after the expiration date. NOTE: This sheet is a summary. It may not cover all possible information. If you have questions about this medicine, talk to your doctor, pharmacist, or health care provider.  2018 Elsevier/Gold Standard (2016-08-20 12:58:03)  

## 2018-08-15 ENCOUNTER — Encounter: Payer: Self-pay | Admitting: Family Medicine

## 2018-08-15 ENCOUNTER — Ambulatory Visit: Payer: Medicare PPO | Admitting: Family Medicine

## 2018-08-15 VITALS — BP 102/52 | HR 74 | Temp 98.2°F | Wt 172.8 lb

## 2018-08-15 DIAGNOSIS — M545 Low back pain: Secondary | ICD-10-CM

## 2018-08-19 NOTE — Progress Notes (Signed)
HEMATOLOGY/ONCOLOGY CLINIC NOTE  Date of Service: 08/22/2018  Patient Care Team: Billie Ruddy, MD as PCP - General (Family Medicine)  CHIEF COMPLAINTS/PURPOSE OF CONSULTATION:  Diffuse Large B-Cell Lymphoma  HISTORY OF PRESENTING ILLNESS:   Jay Webster is a wonderful 84 y.o. male who has been referred to [place] by surgeon Dr. Armandina Gemma for evaluation and management of Diffuse Large B-Cell Lymphoma. The pt reports that he is doing well overall.   Of note prior to the patient's visit today, pt has had Perineum biopsy completed on 03/11/18 with results revealing Diffuse Large B-Cell Lymphoma. The pt had surgery to remove his irregularly shaped mass in the left anterior portion of the perineum with Dr. Armandina Gemma.  The pt reports first noticing the mass in his left buttock about 2 months ago, after a visit with Dr. Trena Platt. The pt decided to consult his surgeon Dr. Armandina Gemma whom he previously had a thyroidectomy with. The pt notes that the spot became as large as a golf ball and did not breach the skin. The pt notes that it wasn't terribly uncomfortable to sit on. He denies feeling any differently recently than in the past  6 months to a year.   The pt notes that he has been having some continued discharge and is using Depends. He is changing his gauze twice each day.   He denies any recent medical problems. He continues to function independently at home and lives alone, as his partner of 30+ years died in the past year. He notes that he does not have any family members or friends who he would want to include in his care. He denies any difficulties functioning day to day and denies concerns for his memory.   He has been on thyroid replacement for the last 10 years following a thyroidectomy after thyroid cancer, and was treated with one dose of radioactive iodine.   The pt also notes a uric acid kidney stone history which hasn't been a problem in many years.    Most  recent lab results (02/28/18) of CBC  is as follows: all values are WNL except for HCT at 38.3.  On review of systems, pt reports left buttock surgical wound, ganglion cyst on right wrist, and denies fevers, chills, night sweats, unexpected weight loss, new fatigue, pain along the spine, leg swelling, noticing any new lumps or bumps, and any other symptoms.   On PMHx the pt denies heart problems, strokes, abdominal problems, lung problems.  On Social Hx the pt reports that he quit smoking in 1990, and used to smoke 1-2 packs of cigarettes each day before this. He denies excess alcohol being a problem, and consumes one glass of wine rarely. 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  On Family Hx the pt reports brother died of sarcoma, different brother with Type I DM, and denies other cancer or blood disorders.  Interval History:    Jay Webster returns today for management, evaluation, prior to C6 R-CHOP treatment of his recently diagnosed Diffuse Large B-Cell Lymphoma. The patient's last visit with [place] was on 08/02/18. The pt reports that he is doing well overall.  The pt reports that he has not developed any new concerns in the interim. He denies any concerns for infections, fevers, chills, night sweats. He notes that he is eating well and his energy levels have been stable.   Lab results today (08/22/18) of CBC  w/diff and CMP is as follows: all values are WNL except for RBC at 3.16, HGB at 10.1, HCT at 30.3, Abs immature granulocytes at 0.09k.  On review of systems, pt reports good energy levels, eating well, and denies fevers, chills, night sweats, concerns for infections, abdominal pains, leg swelling, and any other symptoms.   MEDICAL HISTORY:  Past Medical History:  Diagnosis Date  . Arthritis    OA AND PAIN LEFT HIP AND OTHER JOINT PAINS  . Cancer (HCC)    THYROID CANCER - HAD THYROID REMOVED  . History of kidney stones   . History of  shingles    RESOLVED  . Hypertension   . Hypothyroidism   . Low back pain   . Macular degeneration of both eyes   . RBBB (right bundle branch block with left anterior fascicular block)   . Seasonal allergies   . Thyroid disease   . Uric acid renal calculus 06/20/2007   Qualifier: Diagnosis of  By: Rogue Bussing CMA, Maryann Alar      SURGICAL HISTORY: Past Surgical History:  Procedure Laterality Date  . APPENDECTOMY  1948  . COLONOSCOPY    . Elbow Radical Reduction  1973  . IR IMAGING GUIDED PORT INSERTION  05/05/2018  . [place]  . MASS EXCISION N/A 03/11/2018   Procedure: EXCISION MASS OF PERINEUM;  Surgeon: Armandina Gemma, MD;  Location: [facility];  Service: General;  Laterality: N/A;  . REMOVAL OF FIRST RIB   NOV 1994   FOR COMPRESSED VEIN BETWEEN 1 ST RIB AND COLLARBONE  . Rib removed, 1st  1994  . ROTATOR CUFF REPAIR  2009  . THYROIDECTOMY  2010  . TOTAL HIP ARTHROPLASTY Left 05/31/2013   Procedure: LEFT TOTAL HIP ARTHROPLASTY ANTERIOR APPROACH;  Surgeon: Gearlean Alf, MD;  Location: [facility];  Service: Orthopedics;  Laterality: Left;    SOCIAL HISTORY: Social History   Socioeconomic History  . Marital status: Widowed    Spouse name: Not on file  . Number of children: Not on file  . Years of education: Not on file  . Highest education level: Not on file  Occupational History  . Occupation: retired  Scientific laboratory technician  . Financial resource strain: Not on file  . Food insecurity:    Worry: Not on file    Inability: Not on file  . Transportation needs:    Medical: Not on file    Non-medical: Not on file  Tobacco Use  . Smoking status: Former Smoker    Types: Cigarettes    Last attempt to quit: 02/22/1979    Years since quitting: 39.5  . Smokeless tobacco: Never Used  Substance and Sexual Activity  . Alcohol use: Yes    Alcohol/week: 0.0 standard drinks  . Drug use: No  . Sexual activity: Yes  Lifestyle  . Physical activity:    Days per week: Not on  file    Minutes per session: Not on file  . Stress: Not on file  Relationships  . Social connections:    Talks on phone: Not on file    Gets together: Not on file    Attends religious service: Not on file    Active member of club or organization: Not on file    Attends meetings of clubs or organizations: Not on file    Relationship status: Not on file  . Intimate partner violence:    Fear of current or ex partner: Not on file  Emotionally abused: Not on file    Physically abused: Not on file    Forced sexual activity: Not on file  Other Topics Concern  . Not on file  Social History Narrative  . Not on file    FAMILY HISTORY: Family History  Problem Relation Age of Onset  . Heart disease Mother   . Diabetes Mother   . Anemia Unknown   . Thyroid disease Unknown     ALLERGIES:  is allergic to penicillins.  MEDICATIONS:  Current Outpatient Medications  Medication Sig Dispense Refill  . acetaminophen (TYLENOL) 325 MG tablet Take 325-650 mg by mouth daily as needed for headache.    . Aflibercept (EYLEA) 2 MG/0.05ML SOLN Inject 1 Dose into the eye every 8 (eight) weeks.     Marland Kitchen allopurinol (ZYLOPRIM) 300 MG tablet TAKE 1 TABLET EVERY DAY 90 tablet 1  . benazepril (LOTENSIN) 20 MG tablet TAKE 1 TABLET EVERY DAY 90 tablet 1  . cetirizine (ZYRTEC) 10 MG tablet Take 10 mg by mouth daily as needed for allergies.    . Glucosamine-Chondroitin (OSTEO BI-FLEX REGULAR STRENGTH PO) Take 1 tablet by mouth 2 (two) times daily.    Marland Kitchen levothyroxine (SYNTHROID, LEVOTHROID) 125 MCG tablet Take 1 tablet (125 mcg total) by mouth daily. 30 tablet 0  . lidocaine-prilocaine (EMLA) cream Apply to affected area once (Patient not taking: Reported on 08/15/2018) 30 g 3  . Multiple Vitamin (MULTIVITAMIN) tablet Take 1 tablet by mouth daily.    . Multiple Vitamins-Minerals (PRESERVISION AREDS 2 PO) Take 1 tablet by mouth 2 (two) times daily.    . ondansetron (ZOFRAN) 8 MG tablet Take 1 tablet (8 mg total)  by mouth 2 (two) times daily as needed for refractory nausea / vomiting. Start on day 3 after chemotherapy. 30 tablet 1  . Polyethyl Glycol-Propyl Glycol (SYSTANE OP) Place 1 drop into both eyes daily as needed (irritation).    . predniSONE (DELTASONE) 20 MG tablet Take 3 tablets (60 mg total) by mouth daily. Take on days 1-5 of chemotherapy. 15 tablet 6  . sodium chloride (OCEAN) 0.65 % SOLN nasal spray Place 1 spray into both nostrils as needed for congestion.     No current facility-administered medications for this visit.     REVIEW OF SYSTEMS:    A 10+ POINT REVIEW OF SYSTEMS WAS OBTAINED including neurology, dermatology, psychiatry, cardiac, respiratory, lymph, extremities, GI, GU, Musculoskeletal, constitutional, breasts, reproductive, HEENT.  All pertinent positives are noted in the HPI.  All others are negative.   PHYSICAL EXAMINATION: ECOG PERFORMANCE STATUS: 2 - Symptomatic, <50% confined to bed  Vitals:   08/22/18 1120  BP: 128/67  Pulse: 79  Resp: 19  Temp: 97.9 F (36.6 C)  SpO2: 100%   Filed Weights   08/22/18 1120  Weight: 172 lb (78 kg)   .Body mass index is 29.52 kg/m.  GENERAL:alert, in no acute distress and comfortable SKIN: no acute rashes, no significant lesions EYES: conjunctiva are pink and non-injected, sclera anicteric OROPHARYNX: MMM, no exudates, no oropharyngeal erythema or ulceration NECK: supple, no JVD LYMPH:  no palpable lymphadenopathy in the cervical, axillary or inguinal regions LUNGS: clear to auscultation b/l with normal respiratory effort HEART: regular rate & rhythm ABDOMEN:  normoactive bowel sounds , non tender, not distended. No palpable hepatosplenomegaly.  Extremity: no pedal edema PSYCH: alert & oriented x 3 with fluent speech NEURO: no focal motor/sensory deficits   LABORATORY DATA:  I have reviewed the data as listed  .  CBC Latest Ref Rng & Units 08/22/2018 08/02/2018 07/07/2018  WBC 4.0 - 10.5 K/uL 7.3 6.0 9.0    Hemoglobin 13.0 - 17.0 g/dL 10.1(L) 10.4(L) 10.9(L)  Hematocrit 39.0 - 52.0 % 30.3(L) 30.7(L) 32.7(L)  Platelets 150 - 400 K/uL 260 260 224   . CBC    Component Value Date/Time   WBC 7.3 08/22/2018 1101   RBC 3.16 (L) 08/22/2018 1101   HGB 10.1 (L) 08/22/2018 1101   HGB 10.4 (L) 08/02/2018 0828   HCT 30.3 (L) 08/22/2018 1101   PLT 260 08/22/2018 1101   PLT 260 08/02/2018 0828   MCV 95.9 08/22/2018 1101   MCH 32.0 08/22/2018 1101   MCHC 33.3 08/22/2018 1101   RDW 15.5 08/22/2018 1101   LYMPHSABS 0.7 08/22/2018 1101   MONOABS 0.6 08/22/2018 1101   EOSABS 0.1 08/22/2018 1101   BASOSABS 0.1 08/22/2018 1101    CMP Latest Ref Rng & Units 08/02/2018 07/07/2018 06/21/2018  Glucose 70 - 99 mg/dL 98 107(H) 104(H)  BUN 8 - 23 mg/dL 19 14 16   Creatinine 0.61 - 1.24 mg/dL 0.83 0.85 0.89  Sodium 135 - 145 mmol/L 141 142 143  Potassium 3.5 - 5.1 mmol/L 4.3 4.9 4.3  Chloride 98 - 111 mmol/L 109 108 110  CO2 22 - 32 mmol/L 25 27 28   Calcium 8.9 - 10.3 mg/dL 9.0 9.0 9.2  Total Protein 6.5 - 8.1 g/dL 6.4(L) 6.4(L) 6.4(L)  Total Bilirubin 0.3 - 1.2 mg/dL 0.3 0.3 0.4  Alkaline Phos 38 - 126 U/L 85 102 92  AST 15 - 41 U/L 15 14(L) 17  ALT 0 - 44 U/L 12 12 17    . Lab Results  Component Value Date   LDH 169 08/02/2018   Component     Latest Ref Rng & Units 03/24/2018  LDH     98 - 192 U/L 185  HCV Ab     0.0 - 0.9 s/co ratio 0.2  Hep B Core Ab, Tot     Negative Negative  Hepatitis B Surface Ag     Negative Negative  HIV Screen 4th Generation wRfx     Non Reactive Non Reactive    03/11/18 Perineum Bx:    RADIOGRAPHIC STUDIES: I have personally reviewed the radiological images as listed and agreed with the findings in the report. No results found.  ASSESSMENT & PLAN:   84 y.o. male with  1. Diffuse Large B-Cell Lymphoma -newly diagnosed presenting as a mass in the left medial gluteal region/perineum s/p resection. -03/11/18 Surgical bx which revealed Diffuse Large B-Cell  Non-Hodgkin's Lymphoma  -04/13/18 ECHO revealed LV EF of 60-65%  -No Hep B, Hep C or HIV from 03/24/18 labs -04/18/18 PET/CT which revealed Mildly hypermetabolic small to borderline enlarged abdominal and pelvic retroperitoneal lymph nodes. Probable mild hypermetabolism within subcentimeter axillary lymph nodes, with misregistration on fused images. 2. Scattered lucent lesions in the iliac wings and right fifth rib. Continued attention on follow-up exams is warranted. 3. Scattered small pulmonary nodules are nonspecific and too small for PET resolution. Continued attention on follow-up exams is warranted. 4. Intermediate density lesion deep to the left gluteal fold, with associated hypermetabolism, which may represent a complex cyst or abscess. 5. Aortic atherosclerosis. Coronary artery calcification. 6. Left renal stone. -Treatment plan include R-CHOP for 6 cycles starting 05/10/18   07/11/18 PET/CT revealed No evidence of recurrent lymphoma. 2. Left renal stone. 3. Aortic atherosclerosis. Coronary artery calcification.   PLAN:  -Discussed pt labwork today, 08/22/18; blood  counts are stable  -The pt has no prohibitive toxicities from continuing C6 R-CHOP with G-CSF support at this time.  -Will complete PET/CT in 4 weeks for treatment efficacy evaluation -Will tentatively refer pt to IR for port removal after next visit, and evaluating the PET/CT results  -Continue avoiding raw foods for up to 6 months after completing treatment and minimize exposure to food-borne infections -Recommended that the pt continue to eat well, drink at least 48-64 oz of water each day, and walk 20-30 minutes each day.   -Recommend waiting 6 months after completion of chemotherapy for routine dental cleaning, and after Port is removed -Pt notes he is doing well living alone. He notes has a DNR in place at home. He has good support from his niece for who he speaks with daily.  -Recommend sucking on ice chips while receiving  Doxorubicin  -Will see the pt back in 5 weeks  2.  Patient Active Problem List   Diagnosis Date Noted  . Diffuse large B-cell lymphoma ([place]) 03/30/2018  . Perineal mass, male 03/11/2018  . Perineal mass in male 03/08/2018  . Degeneration of lumbar intervertebral disc 10/05/2017  . History of total hip arthroplasty 10/05/2017  . Combined forms of age-related cataract of both eyes 04/12/2014  . OA (osteoarthritis) of hip 05/31/2013  . History of revision of total replacement of left hip joint 05/31/2013  . High risk medication use 10/25/2012  . Vitreomacular adhesion 05/24/2012  . Posterior vitreous detachment 04/12/2012  . Cystoid macular edema 11/10/2011  . Subretinal neovascularization of macula 09/11/2011  . Retinal pigment epithelial detachment, bilateral 09/11/2011  . Macular degeneration of both eyes 06/09/2011  . History of thyroid cancer 04/21/2011  . Macular degeneration 01/05/2011  . CARCINOMA, THYROID GLAND, PAPILLARY 11/20/2008  . HYPOTHYROIDISM, POSTSURGICAL 11/20/2008  . Osteoarthritis 07/01/2007  . LOW BACK PAIN 07/01/2007  . Uric acid renal calculus 06/20/2007  . Essential hypertension 06/20/2007   -continue f/u with PCP   PET/CT in 4 weeks  RTC with Dr Irene Limbo with labs in 5 weeks     All of the patients questions were answered with apparent satisfaction. The patient knows to call the clinic with any problems, questions or concerns.  The total time spent in the appt was 25 minutes and more than 50% was on counseling and direct patient cares.   Sullivan Lone MD MS AAHIVMS [facility] Hematology/Oncology Physician [facility]  (Office):       [phone] (Work cell):  [phone] (Fax):           [phone]  08/22/2018 11:45 AM  I, Baldwin Jamaica, am acting as a scribe for Dr. Sullivan Lone.   .I have reviewed the above documentation for accuracy and completeness, and I agree with the above. Brunetta Genera MD

## 2018-08-22 ENCOUNTER — Inpatient Hospital Stay: Payer: Medicare PPO

## 2018-08-22 ENCOUNTER — Inpatient Hospital Stay: Payer: Medicare PPO | Attending: Hematology

## 2018-08-22 ENCOUNTER — Telehealth: Payer: Self-pay | Admitting: Hematology

## 2018-08-22 ENCOUNTER — Inpatient Hospital Stay: Payer: Medicare PPO | Admitting: Hematology

## 2018-08-22 VITALS — BP 128/67 | HR 79 | Temp 97.9°F | Resp 19 | Ht 64.0 in | Wt 172.0 lb

## 2018-08-22 VITALS — BP 111/65 | HR 76 | Temp 97.7°F | Resp 17

## 2018-08-22 DIAGNOSIS — Z5111 Encounter for antineoplastic chemotherapy: Secondary | ICD-10-CM | POA: Diagnosis not present

## 2018-08-22 DIAGNOSIS — I2510 Atherosclerotic heart disease of native coronary artery without angina pectoris: Secondary | ICD-10-CM | POA: Insufficient documentation

## 2018-08-22 DIAGNOSIS — M67431 Ganglion, right wrist: Secondary | ICD-10-CM

## 2018-08-22 DIAGNOSIS — Z7689 Persons encountering health services in other specified circumstances: Secondary | ICD-10-CM | POA: Diagnosis not present

## 2018-08-22 DIAGNOSIS — E890 Postprocedural hypothyroidism: Secondary | ICD-10-CM

## 2018-08-22 DIAGNOSIS — Z79899 Other long term (current) drug therapy: Secondary | ICD-10-CM | POA: Diagnosis not present

## 2018-08-22 DIAGNOSIS — Z87442 Personal history of urinary calculi: Secondary | ICD-10-CM | POA: Diagnosis not present

## 2018-08-22 DIAGNOSIS — Z87891 Personal history of nicotine dependence: Secondary | ICD-10-CM | POA: Insufficient documentation

## 2018-08-22 DIAGNOSIS — C8330 Diffuse large B-cell lymphoma, unspecified site: Secondary | ICD-10-CM | POA: Diagnosis not present

## 2018-08-22 DIAGNOSIS — I700 Atherosclerosis of aorta: Secondary | ICD-10-CM

## 2018-08-22 DIAGNOSIS — Z5112 Encounter for antineoplastic immunotherapy: Secondary | ICD-10-CM | POA: Diagnosis not present

## 2018-08-22 DIAGNOSIS — I10 Essential (primary) hypertension: Secondary | ICD-10-CM | POA: Diagnosis not present

## 2018-08-22 DIAGNOSIS — Z85850 Personal history of malignant neoplasm of thyroid: Secondary | ICD-10-CM | POA: Insufficient documentation

## 2018-08-22 DIAGNOSIS — M1990 Unspecified osteoarthritis, unspecified site: Secondary | ICD-10-CM | POA: Diagnosis not present

## 2018-08-22 DIAGNOSIS — Z66 Do not resuscitate: Secondary | ICD-10-CM

## 2018-08-22 DIAGNOSIS — Z7952 Long term (current) use of systemic steroids: Secondary | ICD-10-CM

## 2018-08-22 DIAGNOSIS — N200 Calculus of kidney: Secondary | ICD-10-CM | POA: Insufficient documentation

## 2018-08-22 LAB — CMP (CANCER CENTER ONLY)
ALBUMIN: 3.3 g/dL — AB (ref 3.5–5.0)
ALT: 14 U/L (ref 0–44)
AST: 16 U/L (ref 15–41)
Alkaline Phosphatase: 89 U/L (ref 38–126)
Anion gap: 8 (ref 5–15)
BUN: 14 mg/dL (ref 8–23)
CO2: 26 mmol/L (ref 22–32)
Calcium: 8.8 mg/dL — ABNORMAL LOW (ref 8.9–10.3)
Chloride: 106 mmol/L (ref 98–111)
Creatinine: 0.77 mg/dL (ref 0.61–1.24)
GFR, Est AFR Am: >60 mL/min (ref >60)
GFR, Estimated: >60 mL/min (ref >60)
GLUCOSE: 106 mg/dL — AB (ref 70–99)
Potassium: 4.3 mmol/L (ref 3.5–5.1)
Sodium: 140 mmol/L (ref 135–145)
Total Bilirubin: 0.3 mg/dL (ref 0.3–1.2)
Total Protein: 6.4 g/dL — ABNORMAL LOW (ref 6.5–8.1)

## 2018-08-22 LAB — CBC WITH DIFFERENTIAL/PLATELET
Abs Immature Granulocytes: 0.09 10*3/uL — ABNORMAL HIGH (ref 0.00–0.07)
BASOS ABS: 0.1 10*3/uL (ref 0.0–0.1)
Basophils Relative: 1 %
Eosinophils Absolute: 0.1 10*3/uL (ref 0.0–0.5)
Eosinophils Relative: 2 %
HCT: 30.3 % — ABNORMAL LOW (ref 39.0–52.0)
Hemoglobin: 10.1 g/dL — ABNORMAL LOW (ref 13.0–17.0)
Immature Granulocytes: 1 %
Lymphocytes Relative: 10 %
Lymphs Abs: 0.7 10*3/uL (ref 0.7–4.0)
MCH: 32 pg (ref 26.0–34.0)
MCHC: 33.3 g/dL (ref 30.0–36.0)
MCV: 95.9 fL (ref 80.0–100.0)
Monocytes Absolute: 0.6 10*3/uL (ref 0.1–1.0)
Monocytes Relative: 8 %
NEUTROS ABS: 5.7 10*3/uL (ref 1.7–7.7)
Neutrophils Relative %: 78 %
Platelets: 260 10*3/uL (ref 150–400)
RBC: 3.16 MIL/uL — ABNORMAL LOW (ref 4.22–5.81)
RDW: 15.5 % (ref 11.5–15.5)
WBC: 7.3 10*3/uL (ref 4.0–10.5)
nRBC: 0 % (ref 0.0–0.2)

## 2018-08-22 MED ORDER — SODIUM CHLORIDE 0.9 % IV SOLN
375.0000 mg/m2 | Freq: Once | INTRAVENOUS | Status: AC
  Administered 2018-08-22: 700 mg via INTRAVENOUS
  Filled 2018-08-22: qty 20

## 2018-08-22 MED ORDER — SODIUM CHLORIDE 0.9 % IV SOLN
750.0000 mg/m2 | Freq: Once | INTRAVENOUS | Status: AC
  Administered 2018-08-22: 1440 mg via INTRAVENOUS
  Filled 2018-08-22: qty 72

## 2018-08-22 MED ORDER — PALONOSETRON HCL INJECTION 0.25 MG/5ML
INTRAVENOUS | Status: AC
  Filled 2018-08-22: qty 5

## 2018-08-22 MED ORDER — ACETAMINOPHEN 325 MG PO TABS
ORAL_TABLET | ORAL | Status: AC
  Filled 2018-08-22: qty 2

## 2018-08-22 MED ORDER — DEXAMETHASONE SODIUM PHOSPHATE 10 MG/ML IJ SOLN
10.0000 mg | Freq: Once | INTRAMUSCULAR | Status: AC
  Administered 2018-08-22: 10 mg via INTRAVENOUS

## 2018-08-22 MED ORDER — ACETAMINOPHEN 325 MG PO TABS
650.0000 mg | ORAL_TABLET | Freq: Once | ORAL | Status: AC
  Administered 2018-08-22: 650 mg via ORAL

## 2018-08-22 MED ORDER — HEPARIN SOD (PORK) LOCK FLUSH 100 UNIT/ML IV SOLN
500.0000 [IU] | Freq: Once | INTRAVENOUS | Status: AC | PRN
  Administered 2018-08-22: 500 [IU]
  Filled 2018-08-22: qty 5

## 2018-08-22 MED ORDER — DOXORUBICIN HCL CHEMO IV INJECTION 2 MG/ML
50.0000 mg/m2 | Freq: Once | INTRAVENOUS | Status: AC
  Administered 2018-08-22: 96 mg via INTRAVENOUS
  Filled 2018-08-22: qty 48

## 2018-08-22 MED ORDER — DIPHENHYDRAMINE HCL 25 MG PO CAPS
ORAL_CAPSULE | ORAL | Status: AC
  Filled 2018-08-22: qty 2

## 2018-08-22 MED ORDER — VINCRISTINE SULFATE CHEMO INJECTION 1 MG/ML
2.0000 mg | Freq: Once | INTRAVENOUS | Status: AC
  Administered 2018-08-22: 2 mg via INTRAVENOUS
  Filled 2018-08-22: qty 2

## 2018-08-22 MED ORDER — DIPHENHYDRAMINE HCL 25 MG PO CAPS
50.0000 mg | ORAL_CAPSULE | Freq: Once | ORAL | Status: AC
  Administered 2018-08-22: 50 mg via ORAL

## 2018-08-22 MED ORDER — DEXAMETHASONE SODIUM PHOSPHATE 10 MG/ML IJ SOLN
INTRAMUSCULAR | Status: AC
  Filled 2018-08-22: qty 1

## 2018-08-22 MED ORDER — SODIUM CHLORIDE 0.9% FLUSH
10.0000 mL | INTRAVENOUS | Status: DC | PRN
  Administered 2018-08-22: 10 mL
  Filled 2018-08-22: qty 10

## 2018-08-22 MED ORDER — SODIUM CHLORIDE 0.9 % IV SOLN
Freq: Once | INTRAVENOUS | Status: AC
  Administered 2018-08-22: 13:00:00 via INTRAVENOUS
  Filled 2018-08-22: qty 250

## 2018-08-22 MED ORDER — PALONOSETRON HCL INJECTION 0.25 MG/5ML
0.2500 mg | Freq: Once | INTRAVENOUS | Status: AC
  Administered 2018-08-22: 0.25 mg via INTRAVENOUS

## 2018-08-22 NOTE — Telephone Encounter (Signed)
Scheduled appt per 12/16 los - gave patient AVS and calender per los. - central radiology to contact patient with scan

## 2018-08-22 NOTE — Patient Instructions (Signed)
[facility] Discharge Instructions for Patients Receiving Chemotherapy  Today you received the following chemotherapy agents Adriamycin, Vincristine, cytoxan, and Rituxan  To help prevent nausea and vomiting after your treatment, we encourage you to take your nausea medication as directed   If you develop nausea and vomiting that is not controlled by your nausea medication, call the clinic.   BELOW ARE SYMPTOMS THAT SHOULD BE REPORTED IMMEDIATELY:  *FEVER GREATER THAN 100.5 F  *CHILLS WITH OR WITHOUT FEVER  NAUSEA AND VOMITING THAT IS NOT CONTROLLED WITH YOUR NAUSEA MEDICATION  *UNUSUAL SHORTNESS OF BREATH  *UNUSUAL BRUISING OR BLEEDING  TENDERNESS IN MOUTH AND THROAT WITH OR WITHOUT PRESENCE OF ULCERS  *URINARY PROBLEMS  *BOWEL PROBLEMS  UNUSUAL RASH Items with * indicate a potential emergency and should be followed up as soon as possible.  Feel free to call the clinic should you have any questions or concerns. The clinic phone number is [phone].  Please show the [facility] at check-in to the Emergency Department and triage nurse.

## 2018-08-24 ENCOUNTER — Inpatient Hospital Stay: Payer: Medicare PPO

## 2018-08-24 ENCOUNTER — Telehealth: Payer: Self-pay | Admitting: *Deleted

## 2018-08-24 VITALS — BP 136/69 | HR 82 | Temp 98.2°F | Resp 19

## 2018-08-24 DIAGNOSIS — Z5111 Encounter for antineoplastic chemotherapy: Secondary | ICD-10-CM | POA: Diagnosis not present

## 2018-08-24 DIAGNOSIS — C8330 Diffuse large B-cell lymphoma, unspecified site: Secondary | ICD-10-CM | POA: Diagnosis not present

## 2018-08-24 DIAGNOSIS — Z7689 Persons encountering health services in other specified circumstances: Secondary | ICD-10-CM | POA: Diagnosis not present

## 2018-08-24 DIAGNOSIS — I700 Atherosclerosis of aorta: Secondary | ICD-10-CM | POA: Diagnosis not present

## 2018-08-24 DIAGNOSIS — E890 Postprocedural hypothyroidism: Secondary | ICD-10-CM | POA: Diagnosis not present

## 2018-08-24 DIAGNOSIS — M67431 Ganglion, right wrist: Secondary | ICD-10-CM | POA: Diagnosis not present

## 2018-08-24 DIAGNOSIS — Z85850 Personal history of malignant neoplasm of thyroid: Secondary | ICD-10-CM | POA: Diagnosis not present

## 2018-08-24 DIAGNOSIS — Z5112 Encounter for antineoplastic immunotherapy: Secondary | ICD-10-CM | POA: Diagnosis not present

## 2018-08-24 DIAGNOSIS — I10 Essential (primary) hypertension: Secondary | ICD-10-CM | POA: Diagnosis not present

## 2018-08-24 MED ORDER — PEGFILGRASTIM-CBQV 6 MG/0.6ML ~~LOC~~ SOSY
PREFILLED_SYRINGE | SUBCUTANEOUS | Status: AC
  Filled 2018-08-24: qty 0.6

## 2018-08-24 MED ORDER — PEGFILGRASTIM-CBQV 6 MG/0.6ML ~~LOC~~ SOSY
6.0000 mg | PREFILLED_SYRINGE | Freq: Once | SUBCUTANEOUS | Status: AC
  Administered 2018-08-24: 6 mg via SUBCUTANEOUS

## 2018-08-24 NOTE — Telephone Encounter (Signed)
Requested that scheduling be given this message: Please schedule all winter appointments in the morning hours. Patient has macular degeneration and does not see well to drive as it gets darker.

## 2018-08-24 NOTE — Patient Instructions (Signed)
Pegfilgrastim injection  What is this medicine?  PEGFILGRASTIM (PEG fil gra stim) is a long-acting granulocyte colony-stimulating factor that stimulates the growth of neutrophils, a type of white blood cell important in the body's fight against infection. It is used to reduce the incidence of fever and infection in patients with certain types of cancer who are receiving chemotherapy that affects the bone marrow, and to increase survival after being exposed to high doses of radiation.  This medicine may be used for other purposes; ask your health care provider or pharmacist if you have questions.  COMMON BRAND NAME(S): Fulphila, Neulasta, UDENYCA  What should I tell my health care provider before I take this medicine?  They need to know if you have any of these conditions:  -kidney disease  -latex allergy  -ongoing radiation therapy  -sickle cell disease  -skin reactions to acrylic adhesives (On-Body Injector only)  -an unusual or allergic reaction to pegfilgrastim, filgrastim, other medicines, foods, dyes, or preservatives  -pregnant or trying to get pregnant  -breast-feeding  How should I use this medicine?  This medicine is for injection under the skin. If you get this medicine at home, you will be taught how to prepare and give the pre-filled syringe or how to use the On-body Injector. Refer to the patient Instructions for Use for detailed instructions. Use exactly as directed. Tell your healthcare provider immediately if you suspect that the On-body Injector may not have performed as intended or if you suspect the use of the On-body Injector resulted in a missed or partial dose.  It is important that you put your used needles and syringes in a special sharps container. Do not put them in a trash can. If you do not have a sharps container, call your pharmacist or healthcare provider to get one.  Talk to your pediatrician regarding the use of this medicine in children. While this drug may be prescribed for  selected conditions, precautions do apply.  Overdosage: If you think you have taken too much of this medicine contact a poison control center or emergency room at once.  NOTE: This medicine is only for you. Do not share this medicine with others.  What if I miss a dose?  It is important not to miss your dose. Call your doctor or health care professional if you miss your dose. If you miss a dose due to an On-body Injector failure or leakage, a new dose should be administered as soon as possible using a single prefilled syringe for manual use.  What may interact with this medicine?  Interactions have not been studied.  Give your health care provider a list of all the medicines, herbs, non-prescription drugs, or dietary supplements you use. Also tell them if you smoke, drink alcohol, or use illegal drugs. Some items may interact with your medicine.  This list may not describe all possible interactions. Give your health care provider a list of all the medicines, herbs, non-prescription drugs, or dietary supplements you use. Also tell them if you smoke, drink alcohol, or use illegal drugs. Some items may interact with your medicine.  What should I watch for while using this medicine?  You may need blood work done while you are taking this medicine.  If you are going to need a MRI, CT scan, or other procedure, tell your doctor that you are using this medicine (On-Body Injector only).  What side effects may I notice from receiving this medicine?  Side effects that you should report to   your doctor or health care professional as soon as possible:  -allergic reactions like skin rash, itching or hives, swelling of the face, lips, or tongue  -back pain  -dizziness  -fever  -pain, redness, or irritation at site where injected  -pinpoint red spots on the skin  -red or dark-brown urine  -shortness of breath or breathing problems  -stomach or side pain, or pain at the shoulder  -swelling  -tiredness  -trouble passing urine or  change in the amount of urine  Side effects that usually do not require medical attention (report to your doctor or health care professional if they continue or are bothersome):  -bone pain  -muscle pain  This list may not describe all possible side effects. Call your doctor for medical advice about side effects. You may report side effects to FDA at 1-800-FDA-1088.  Where should I keep my medicine?  Keep out of the reach of children.  If you are using this medicine at home, you will be instructed on how to store it. Throw away any unused medicine after the expiration date on the label.  NOTE: This sheet is a summary. It may not cover all possible information. If you have questions about this medicine, talk to your doctor, pharmacist, or health care provider.   2019 Elsevier/Gold Standard (2017-11-29 16:57:08)

## 2018-08-29 ENCOUNTER — Emergency Department (HOSPITAL_COMMUNITY)
Admission: EM | Admit: 2018-08-29 | Discharge: 2018-08-29 | Disposition: A | Discharge status: Home / Self Care | Payer: Medicare PPO | Attending: Emergency Medicine | Admitting: Emergency Medicine

## 2018-08-29 ENCOUNTER — Other Ambulatory Visit: Payer: Self-pay

## 2018-08-29 ENCOUNTER — Encounter (HOSPITAL_COMMUNITY): Payer: Self-pay | Admitting: Emergency Medicine

## 2018-08-29 DIAGNOSIS — Y999 Unspecified external cause status: Secondary | ICD-10-CM | POA: Diagnosis not present

## 2018-08-29 DIAGNOSIS — Z96642 Presence of left artificial hip joint: Secondary | ICD-10-CM | POA: Diagnosis not present

## 2018-08-29 DIAGNOSIS — C8330 Diffuse large B-cell lymphoma, unspecified site: Secondary | ICD-10-CM

## 2018-08-29 DIAGNOSIS — Y929 Unspecified place or not applicable: Secondary | ICD-10-CM | POA: Diagnosis not present

## 2018-08-29 DIAGNOSIS — X58XXXA Exposure to other specified factors, initial encounter: Secondary | ICD-10-CM | POA: Insufficient documentation

## 2018-08-29 DIAGNOSIS — R220 Localized swelling, mass and lump, head: Secondary | ICD-10-CM | POA: Diagnosis present

## 2018-08-29 DIAGNOSIS — Z85850 Personal history of malignant neoplasm of thyroid: Secondary | ICD-10-CM | POA: Diagnosis not present

## 2018-08-29 DIAGNOSIS — Z79899 Other long term (current) drug therapy: Secondary | ICD-10-CM | POA: Diagnosis not present

## 2018-08-29 DIAGNOSIS — Z87891 Personal history of nicotine dependence: Secondary | ICD-10-CM | POA: Diagnosis not present

## 2018-08-29 DIAGNOSIS — Y939 Activity, unspecified: Secondary | ICD-10-CM | POA: Insufficient documentation

## 2018-08-29 DIAGNOSIS — S00532A Contusion of oral cavity, initial encounter: Secondary | ICD-10-CM | POA: Insufficient documentation

## 2018-08-29 DIAGNOSIS — D696 Thrombocytopenia, unspecified: Secondary | ICD-10-CM

## 2018-08-29 DIAGNOSIS — I10 Essential (primary) hypertension: Secondary | ICD-10-CM | POA: Insufficient documentation

## 2018-08-29 DIAGNOSIS — E039 Hypothyroidism, unspecified: Secondary | ICD-10-CM | POA: Diagnosis not present

## 2018-08-29 DIAGNOSIS — C8331 Diffuse large B-cell lymphoma, lymph nodes of head, face, and neck: Secondary | ICD-10-CM | POA: Diagnosis not present

## 2018-08-29 LAB — CBC WITH DIFFERENTIAL/PLATELET
Abs Immature Granulocytes: 0.08 10*3/uL — ABNORMAL HIGH (ref 0.00–0.07)
Basophils Absolute: 0.1 10*3/uL (ref 0.0–0.1)
Basophils Relative: 1 %
Eosinophils Absolute: 0.1 10*3/uL (ref 0.0–0.5)
Eosinophils Relative: 2 %
HCT: 27.3 % — ABNORMAL LOW (ref 39.0–52.0)
Hemoglobin: 9 g/dL — ABNORMAL LOW (ref 13.0–17.0)
Immature Granulocytes: 1 %
Lymphocytes Relative: 11 %
Lymphs Abs: 0.7 10*3/uL (ref 0.7–4.0)
MCH: 32.4 pg (ref 26.0–34.0)
MCHC: 33 g/dL (ref 30.0–36.0)
MCV: 98.2 fL (ref 80.0–100.0)
Monocytes Absolute: 0.2 10*3/uL (ref 0.1–1.0)
Monocytes Relative: 3 %
Neutro Abs: 4.9 10*3/uL (ref 1.7–7.7)
Neutrophils Relative %: 82 %
Platelets: 61 10*3/uL — ABNORMAL LOW (ref 150–400)
RBC: 2.78 MIL/uL — ABNORMAL LOW (ref 4.22–5.81)
RDW: 14.3 % (ref 11.5–15.5)
WBC: 6 10*3/uL (ref 4.0–10.5)
nRBC: 0 % (ref 0.0–0.2)

## 2018-08-29 LAB — BASIC METABOLIC PANEL
Anion gap: 7 (ref 5–15)
BUN: 21 mg/dL (ref 8–23)
CALCIUM: 8.4 mg/dL — AB (ref 8.9–10.3)
CO2: 25 mmol/L (ref 22–32)
Chloride: 104 mmol/L (ref 98–111)
Creatinine, Ser: 0.72 mg/dL (ref 0.61–1.24)
GFR calc Af Amer: >60 mL/min (ref >60)
GFR calc non Af Amer: >60 mL/min (ref >60)
Glucose, Bld: 111 mg/dL — ABNORMAL HIGH (ref 70–99)
Potassium: 3.8 mmol/L (ref 3.5–5.1)
Sodium: 136 mmol/L (ref 135–145)

## 2018-08-29 NOTE — ED Notes (Signed)
Delay on triage. PT currently parking car

## 2018-08-29 NOTE — Discharge Instructions (Addendum)
Call your Oncologist if any further bleeding.  You will need to have repeat labs in one week.  Avoid aspirin and ibuprofen

## 2018-08-29 NOTE — ED Provider Notes (Signed)
[facility] DEPT Provider Note   CSN: [id number] Arrival date & time: 08/29/18  0500     History   Chief Complaint No chief complaint on file.   HPI Jay Webster is a 84 y.o. male.  The history is provided by the patient. No language interpreter was used.  Mouth Lesions  Location:  Buccal mucosa Quality:  Blue Onset quality:  Gradual Severity:  Moderate Progression:  Unchanged Chronicity:  New Context: change in diet and medications   Relieved by:  Nothing Worsened by:  Nothing Ineffective treatments:  None tried Associated symptoms: no dental pain and no fever   Pt has a past medical history of diffuse large B cell Lymphoma.  Pt reports he awoke this am with a large swollen area in his mouth.  Pt reports area burst and began bleeding.  Pt states it seemed like a lot of blood.  He  reports he now has a bruised area to the side of his mouth   Past Medical History:  Diagnosis Date  . Arthritis    OA AND PAIN LEFT HIP AND OTHER JOINT PAINS  . Cancer (HCC)    THYROID CANCER - HAD THYROID REMOVED  . History of kidney stones   . History of shingles    RESOLVED  . Hypertension   . Hypothyroidism   . Low back pain   . Macular degeneration of both eyes   . RBBB (right bundle branch block with left anterior fascicular block)   . Seasonal allergies   . Thyroid disease   . Uric acid renal calculus 06/20/2007   Qualifier: Diagnosis of  By: Rogue Bussing CMA, Jacqualynn      Patient Active Problem List   Diagnosis Date Noted  . Diffuse large B-cell lymphoma ([facility]) 03/30/2018  . Perineal mass, male 03/11/2018  . Perineal mass in male 03/08/2018  . Degeneration of lumbar intervertebral disc 10/05/2017  . History of total hip arthroplasty 10/05/2017  . Combined forms of age-related cataract of both eyes 04/12/2014  . OA (osteoarthritis) of hip 05/31/2013  . History of revision of total replacement of left hip joint 05/31/2013  . High risk  medication use 10/25/2012  . Vitreomacular adhesion 05/24/2012  . Posterior vitreous detachment 04/12/2012  . Cystoid macular edema 11/10/2011  . Subretinal neovascularization of macula 09/11/2011  . Retinal pigment epithelial detachment, bilateral 09/11/2011  . Macular degeneration of both eyes 06/09/2011  . History of thyroid cancer 04/21/2011  . Macular degeneration 01/05/2011  . CARCINOMA, THYROID GLAND, PAPILLARY 11/20/2008  . HYPOTHYROIDISM, POSTSURGICAL 11/20/2008  . Osteoarthritis 07/01/2007  . LOW BACK PAIN 07/01/2007  . Uric acid renal calculus 06/20/2007  . Essential hypertension 06/20/2007    Past Surgical History:  Procedure Laterality Date  . APPENDECTOMY  1948  . COLONOSCOPY    . Elbow Radical Reduction  1973  . IR IMAGING GUIDED PORT INSERTION  05/05/2018  . [place]  . MASS EXCISION N/A 03/11/2018   Procedure: EXCISION MASS OF PERINEUM;  Surgeon: Armandina Gemma, MD;  Location: [facility];  Service: General;  Laterality: N/A;  . REMOVAL OF FIRST RIB   NOV 1994   FOR COMPRESSED VEIN BETWEEN 1 ST RIB AND COLLARBONE  . Rib removed, 1st  1994  . ROTATOR CUFF REPAIR  2009  . THYROIDECTOMY  2010  . TOTAL HIP ARTHROPLASTY Left 05/31/2013   Procedure: LEFT TOTAL HIP ARTHROPLASTY ANTERIOR APPROACH;  Surgeon: Gearlean Alf, MD;  Location: [facility];  Service: Orthopedics;  Laterality: Left;        Home Medications    Prior to Admission medications   Medication Sig Start Date End Date Taking? Authorizing Provider  acetaminophen (TYLENOL) 325 MG tablet Take 325-650 mg by mouth daily as needed for headache.    [provider]  Aflibercept (EYLEA) 2 MG/0.05ML SOLN Inject 1 Dose into the eye every 8 (eight) weeks.     [provider]  allopurinol (ZYLOPRIM) 300 MG tablet TAKE 1 TABLET EVERY DAY 06/14/18   Jay Ruddy, MD  benazepril (LOTENSIN) 20 MG tablet TAKE 1 TABLET EVERY DAY 06/14/18   Jay Ruddy, MD  cetirizine (ZYRTEC) 10  MG tablet Take 10 mg by mouth daily as needed for allergies.    [provider]  Glucosamine-Chondroitin (OSTEO BI-FLEX REGULAR STRENGTH PO) Take 1 tablet by mouth 2 (two) times daily.    [provider]  levothyroxine (SYNTHROID, LEVOTHROID) 125 MCG tablet Take 1 tablet (125 mcg total) by mouth daily. 01/17/18   Jay Lor, MD  lidocaine-prilocaine (EMLA) cream Apply to affected area once Patient not taking: Reported on 08/15/2018 05/04/18   Jay Genera, MD  Multiple Vitamin (MULTIVITAMIN) tablet Take 1 tablet by mouth daily.    [provider]  Multiple Vitamins-Minerals (PRESERVISION AREDS 2 PO) Take 1 tablet by mouth 2 (two) times daily.    [provider]  ondansetron (ZOFRAN) 8 MG tablet Take 1 tablet (8 mg total) by mouth 2 (two) times daily as needed for refractory nausea / vomiting. Start on day 3 after chemotherapy. Patient not taking: Reported on 08/22/2018 05/04/18   Jay Genera, MD  Polyethyl Glycol-Propyl Glycol (SYSTANE OP) Place 1 drop into both eyes daily as needed (irritation).    [provider]  predniSONE (DELTASONE) 20 MG tablet Take 3 tablets (60 mg total) by mouth daily. Take on days 1-5 of chemotherapy. 05/04/18   Jay Genera, MD  sodium chloride (OCEAN) 0.65 % SOLN nasal spray Place 1 spray into both nostrils as needed for congestion.    [provider]    Family History Family History  Problem Relation Age of Onset  . Heart disease Mother   . Diabetes Mother   . Anemia Unknown   . Thyroid disease Unknown     Social History Social History   Tobacco Use  . Smoking status: Former Smoker    Types: Cigarettes    Last attempt to quit: 02/22/1979    Years since quitting: 39.5  . Smokeless tobacco: Never Used  Substance Use Topics  . Alcohol use: Yes    Alcohol/week: 0.0 standard drinks  . Drug use: No     Allergies   Penicillins   Review of Systems Review of Systems    Constitutional: Negative for fever.  HENT: Positive for mouth sores.   All other systems reviewed and are negative.    Physical Exam Updated Vital Signs BP 123/61   Pulse 72   Temp (!) 97.5 F (36.4 C) (Oral)   Resp 16   Ht 5\' 4"  (1.626 m)   Wt 78 kg   SpO2 96%   BMI 29.52 kg/m   Physical Exam Vitals signs and nursing note reviewed.  Constitutional:      Appearance: He is well-developed.  HENT:     Head: Normocephalic and atraumatic.     Nose: Nose normal.     Mouth/Throat:     Comments: 2x3 cm bruised area  left inner mouth buccal mucosa.  No current bleeding Eyes:     Conjunctiva/sclera: Conjunctivae normal.  Neck:     Musculoskeletal: Neck supple.  Cardiovascular:     Rate and Rhythm: Normal rate and regular rhythm.     Heart sounds: No murmur.  Pulmonary:     Effort: Pulmonary effort is normal. No respiratory distress.     Breath sounds: Normal breath sounds.  Abdominal:     Palpations: Abdomen is soft.     Tenderness: There is no abdominal tenderness.  Musculoskeletal: Normal range of motion.  Skin:    General: Skin is warm and dry.  Neurological:     General: No focal deficit present.     Mental Status: He is alert.  Psychiatric:        Mood and Affect: Mood normal.      ED Treatments / Results  Labs (all labs ordered are listed, but only abnormal results are displayed) Labs Reviewed  CBC WITH DIFFERENTIAL/PLATELET - Abnormal; Notable for the following components:      Result Value   RBC 2.78 (*)    Hemoglobin 9.0 (*)    HCT 27.3 (*)    Platelets 61 (*)    Abs Immature Granulocytes 0.08 (*)    All other components within normal limits  BASIC METABOLIC PANEL - Abnormal; Notable for the following components:   Glucose, Bld 111 (*)    Calcium 8.4 (*)    All other components within normal limits    EKG None  Radiology No results found.  Procedures Procedures (including critical care time)  Medications Ordered in ED Medications - No  data to display   Initial Impression / Assessment and Plan / ED Course  I have reviewed the triage vital signs and the nursing notes.  Pertinent labs & imaging results that were available during my care of the patient were reviewed by me and considered in my medical decision making (see chart for details).     MDM  Pt has had a decrease in his platelets from 260 to 61 in a week.  Hemoglobin was 10.1 a week ago and now is 9.0.    I spoke to Dr. Irene Limbo who advised pt will need to recheck in office in one wee, avoid aspirin/ibuprofen.   Call Oncology if any further bleeding.   Final Clinical Impressions(s) / ED Diagnoses   Final diagnoses:  Hematoma of oral cavity    ED Discharge Orders    None    An After Visit Summary was printed and given to the patient.    Fransico Meadow, [place] 08/29/18 [id number]    Charlesetta Shanks, MD 08/29/18 1539

## 2018-08-29 NOTE — ED Notes (Signed)
No active bleeding currently in patient's mouth. Left inner cheek of patient is a purple-bruise like color

## 2018-08-29 NOTE — ED Triage Notes (Signed)
Patient woke with a big sac of blood in his mouth. Patient states he is a chemo patient. He is spit out blood per patient.

## 2018-09-08 ENCOUNTER — Encounter (HOSPITAL_COMMUNITY): Payer: Self-pay | Admitting: Emergency Medicine

## 2018-09-08 ENCOUNTER — Emergency Department (HOSPITAL_COMMUNITY)
Admission: EM | Admit: 2018-09-08 | Discharge: 2018-09-08 | Disposition: A | Discharge status: Home / Self Care | Payer: Medicare PPO | Attending: Emergency Medicine | Admitting: Emergency Medicine

## 2018-09-08 DIAGNOSIS — E039 Hypothyroidism, unspecified: Secondary | ICD-10-CM | POA: Diagnosis not present

## 2018-09-08 DIAGNOSIS — Z87891 Personal history of nicotine dependence: Secondary | ICD-10-CM | POA: Diagnosis not present

## 2018-09-08 DIAGNOSIS — I10 Essential (primary) hypertension: Secondary | ICD-10-CM | POA: Insufficient documentation

## 2018-09-08 DIAGNOSIS — Z79899 Other long term (current) drug therapy: Secondary | ICD-10-CM | POA: Insufficient documentation

## 2018-09-08 DIAGNOSIS — R197 Diarrhea, unspecified: Secondary | ICD-10-CM

## 2018-09-08 LAB — CBC
HEMATOCRIT: 30.7 % — AB (ref 39.0–52.0)
HEMOGLOBIN: 9.9 g/dL — AB (ref 13.0–17.0)
MCH: 32.1 pg (ref 26.0–34.0)
MCHC: 32.2 g/dL (ref 30.0–36.0)
MCV: 99.7 fL (ref 80.0–100.0)
Platelets: 283 10*3/uL (ref 150–400)
RBC: 3.08 MIL/uL — ABNORMAL LOW (ref 4.22–5.81)
RDW: 14.8 % (ref 11.5–15.5)
WBC: 6.7 10*3/uL (ref 4.0–10.5)
nRBC: 0 % (ref 0.0–0.2)

## 2018-09-08 LAB — COMPREHENSIVE METABOLIC PANEL
ALT: 14 U/L (ref 0–44)
AST: 17 U/L (ref 15–41)
Albumin: 3.6 g/dL (ref 3.5–5.0)
Alkaline Phosphatase: 83 U/L (ref 38–126)
Anion gap: 7 (ref 5–15)
BUN: 13 mg/dL (ref 8–23)
CHLORIDE: 107 mmol/L (ref 98–111)
CO2: 25 mmol/L (ref 22–32)
Calcium: 8.8 mg/dL — ABNORMAL LOW (ref 8.9–10.3)
Creatinine, Ser: 0.75 mg/dL (ref 0.61–1.24)
GFR calc Af Amer: >60 mL/min (ref >60)
GFR calc non Af Amer: >60 mL/min (ref >60)
Glucose, Bld: 111 mg/dL — ABNORMAL HIGH (ref 70–99)
POTASSIUM: 4.1 mmol/L (ref 3.5–5.1)
Sodium: 139 mmol/L (ref 135–145)
Total Bilirubin: 0.5 mg/dL (ref 0.3–1.2)
Total Protein: 6.7 g/dL (ref 6.5–8.1)

## 2018-09-08 LAB — URINALYSIS, ROUTINE W REFLEX MICROSCOPIC
Bilirubin Urine: NEGATIVE
Glucose, UA: NEGATIVE mg/dL
Hgb urine dipstick: NEGATIVE
Ketones, ur: NEGATIVE mg/dL
Leukocytes, UA: NEGATIVE
Nitrite: NEGATIVE
PROTEIN: NEGATIVE mg/dL
Specific Gravity, Urine: 1.014 (ref 1.005–1.030)
pH: 5 (ref 5.0–8.0)

## 2018-09-08 LAB — LIPASE, BLOOD: LIPASE: 29 U/L (ref 11–51)

## 2018-09-08 NOTE — ED Provider Notes (Signed)
[facility] DEPT Provider Note   CSN: [id number] Arrival date & time: 09/08/18  1057     History   Chief Complaint Chief Complaint  Patient presents with  . Diarrhea    HPI Jay [facility] is a 85 y.o. male.  HPI Patient presents with diarrhea.  Has had around 4 episodes today.  States it is loose and somewhat fiery.  States his nurse sent him in.  Is being treated for B-cell lymphoma and last chemo was around 2 and half weeks ago.  Has not had diarrhea with other episodes of this.  He feels fine.  No lightheadedness or dizziness.  No fevers.  No abdominal pain.  States since he is been in the ER he has had no further diarrhea.  States he is actually eager to go home.  No dysuria.  Dr. Irene Limbo is his oncologist. Past Medical History:  Diagnosis Date  . Arthritis    OA AND PAIN LEFT HIP AND OTHER JOINT PAINS  . Cancer (HCC)    THYROID CANCER - HAD THYROID REMOVED  . History of kidney stones   . History of shingles    RESOLVED  . Hypertension   . Hypothyroidism   . Low back pain   . Macular degeneration of both eyes   . RBBB (right bundle branch block with left anterior fascicular block)   . Seasonal allergies   . Thyroid disease   . Uric acid renal calculus 06/20/2007   Qualifier: Diagnosis of  By: Rogue Bussing CMA, Jacqualynn      Patient Active Problem List   Diagnosis Date Noted  . Diffuse large B-cell lymphoma ([facility]) 03/30/2018  . Perineal mass, male 03/11/2018  . Perineal mass in male 03/08/2018  . Degeneration of lumbar intervertebral disc 10/05/2017  . History of total hip arthroplasty 10/05/2017  . Combined forms of age-related cataract of both eyes 04/12/2014  . OA (osteoarthritis) of hip 05/31/2013  . History of revision of total replacement of left hip joint 05/31/2013  . High risk medication use 10/25/2012  . Vitreomacular adhesion 05/24/2012  . Posterior vitreous detachment 04/12/2012  . Cystoid macular edema 11/10/2011  .  Subretinal neovascularization of macula 09/11/2011  . Retinal pigment epithelial detachment, bilateral 09/11/2011  . Macular degeneration of both eyes 06/09/2011  . History of thyroid cancer 04/21/2011  . Macular degeneration 01/05/2011  . CARCINOMA, THYROID GLAND, PAPILLARY 11/20/2008  . HYPOTHYROIDISM, POSTSURGICAL 11/20/2008  . Osteoarthritis 07/01/2007  . LOW BACK PAIN 07/01/2007  . Uric acid renal calculus 06/20/2007  . Essential hypertension 06/20/2007    Past Surgical History:  Procedure Laterality Date  . APPENDECTOMY  1948  . COLONOSCOPY    . Elbow Radical Reduction  1973  . IR IMAGING GUIDED PORT INSERTION  05/05/2018  . [place]  . MASS EXCISION N/A 03/11/2018   Procedure: EXCISION MASS OF PERINEUM;  Surgeon: Armandina Gemma, MD;  Location: [facility];  Service: General;  Laterality: N/A;  . REMOVAL OF FIRST RIB   NOV 1994   FOR COMPRESSED VEIN BETWEEN 1 ST RIB AND COLLARBONE  . Rib removed, 1st  1994  . ROTATOR CUFF REPAIR  2009  . THYROIDECTOMY  2010  . TOTAL HIP ARTHROPLASTY Left 05/31/2013   Procedure: LEFT TOTAL HIP ARTHROPLASTY ANTERIOR APPROACH;  Surgeon: Gearlean Alf, MD;  Location: [facility];  Service: Orthopedics;  Laterality: Left;        Home Medications    Prior to Admission  medications   Medication Sig Start Date End Date Taking? Authorizing Provider  acetaminophen (TYLENOL) 325 MG tablet Take 325-650 mg by mouth every 6 (six) hours as needed for headache.    Yes [provider]  Aflibercept (EYLEA) 2 MG/0.05ML SOLN Inject 1 Dose into the eye every 8 (eight) weeks.    Yes [provider]  allopurinol (ZYLOPRIM) 300 MG tablet TAKE 1 TABLET EVERY DAY Patient taking differently: Take 300 mg by mouth daily after supper.  06/14/18  Yes Billie Ruddy, MD  benazepril (LOTENSIN) 20 MG tablet TAKE 1 TABLET EVERY DAY Patient taking differently: Take 20 mg by mouth daily.  06/14/18  Yes Billie Ruddy, MD  cetirizine (ZYRTEC)  10 MG tablet Take 10 mg by mouth daily as needed for allergies.   Yes [provider]  Glucosamine-Chondroitin (OSTEO BI-FLEX REGULAR STRENGTH PO) Take 1 tablet by mouth 2 (two) times daily.   Yes [provider]  levothyroxine (SYNTHROID, LEVOTHROID) 125 MCG tablet Take 1 tablet (125 mcg total) by mouth daily. 01/17/18  Yes Marletta Lor, MD  Multiple Vitamin (MULTIVITAMIN) tablet Take 1 tablet by mouth daily.   Yes [provider]  Multiple Vitamins-Minerals (PRESERVISION AREDS 2 PO) Take 1 tablet by mouth 2 (two) times daily.   Yes [provider]  Polyethyl Glycol-Propyl Glycol (SYSTANE OP) Place 1 drop into both eyes daily as needed (irritation).   Yes [provider]  sodium chloride (OCEAN) 0.65 % SOLN nasal spray Place 1 spray into both nostrils as needed for congestion.   Yes [provider]  lidocaine-prilocaine (EMLA) cream Apply to affected area once Patient not taking: Reported on 08/15/2018 05/04/18   Brunetta Genera, MD  ondansetron (ZOFRAN) 8 MG tablet Take 1 tablet (8 mg total) by mouth 2 (two) times daily as needed for refractory nausea / vomiting. Start on day 3 after chemotherapy. Patient not taking: Reported on 08/22/2018 05/04/18   Brunetta Genera, MD    Family History Family History  Problem Relation Age of Onset  . Heart disease Mother   . Diabetes Mother   . Anemia Other   . Thyroid disease Other     Social History Social History   Tobacco Use  . Smoking status: Former Smoker    Types: Cigarettes    Last attempt to quit: 02/22/1979    Years since quitting: 39.5  . Smokeless tobacco: Never Used  Substance Use Topics  . Alcohol use: Yes    Alcohol/week: 0.0 standard drinks  . Drug use: No     Allergies   Penicillins   Review of Systems Review of Systems  Constitutional: Negative for appetite change.  HENT: Negative for congestion.   Respiratory: Negative for shortness of breath.     Cardiovascular: Negative for chest pain.  Gastrointestinal: Positive for diarrhea. Negative for abdominal pain.  Genitourinary: Negative for flank pain.  Musculoskeletal: Negative for back pain.  Skin: Negative for rash.  Neurological: Negative for seizures and weakness.  Psychiatric/Behavioral: Negative for confusion.     Physical Exam Updated Vital Signs BP 108/73 (BP Location: Right Arm)   Pulse 81   Temp (!) 97.5 F (36.4 C) (Oral)   Resp 20   SpO2 98%   Physical Exam Vitals signs and nursing note reviewed.  HENT:     Head: Atraumatic.     Mouth/Throat:     Mouth: Mucous membranes are moist.  Pulmonary:     Effort: Pulmonary effort is normal.  Abdominal:     Tenderness: There is no abdominal tenderness.  Musculoskeletal:     Right lower leg: No edema.     Left lower leg: No edema.  Skin:    General: Skin is warm.     Capillary Refill: Capillary refill takes less than 2 seconds.  Neurological:     General: No focal deficit present.     Mental Status: He is alert.      ED Treatments / Results  Labs (all labs ordered are listed, but only abnormal results are displayed) Labs Reviewed  COMPREHENSIVE METABOLIC PANEL - Abnormal; Notable for the following components:      Result Value   Glucose, Bld 111 (*)    Calcium 8.8 (*)    All other components within normal limits  CBC - Abnormal; Notable for the following components:   RBC 3.08 (*)    Hemoglobin 9.9 (*)    HCT 30.7 (*)    All other components within normal limits  URINALYSIS, ROUTINE W REFLEX MICROSCOPIC - Abnormal; Notable for the following components:   APPearance HAZY (*)    All other components within normal limits  LIPASE, BLOOD    EKG None  Radiology No results found.  Procedures Procedures (including critical care time)  Medications Ordered in ED Medications - No data to display   Initial Impression / Assessment and Plan / ED Course  I have reviewed the triage vital signs and the  nursing notes.  Pertinent labs & imaging results that were available during my care of the patient were reviewed by me and considered in my medical decision making (see chart for details).     Patient with diarrhea.  Otherwise well-appearing.  No lightheadedness dizziness.  No further diarrhea while here.  Patient is eager to go home.  Benign exam.  I think is reasonable go home.  And follow-up as an outpatient as needed.  On chemotherapy but counts good.  Labs reassuring.  Tolerating orals.  Discharge home.  Final Clinical Impressions(s) / ED Diagnoses   Final diagnoses:  Diarrhea, unspecified type    ED Discharge Orders    None       Davonna Belling, MD 09/08/18 1316

## 2018-09-08 NOTE — ED Triage Notes (Signed)
Pt c/o diarrhea that started this morning. Denies pains or blood in stools. Pt reports last chemo treatment 12/16.

## 2018-09-14 ENCOUNTER — Ambulatory Visit: Payer: Medicare PPO | Admitting: Family Medicine

## 2018-09-14 ENCOUNTER — Encounter: Payer: Self-pay | Admitting: Family Medicine

## 2018-09-14 VITALS — BP 98/56 | HR 80 | Temp 98.0°F | Wt 170.0 lb

## 2018-09-14 DIAGNOSIS — H6121 Impacted cerumen, right ear: Secondary | ICD-10-CM | POA: Diagnosis not present

## 2018-09-14 DIAGNOSIS — J00 Acute nasopharyngitis [common cold]: Secondary | ICD-10-CM

## 2018-09-14 DIAGNOSIS — R0982 Postnasal drip: Secondary | ICD-10-CM | POA: Diagnosis not present

## 2018-09-14 DIAGNOSIS — R221 Localized swelling, mass and lump, neck: Secondary | ICD-10-CM

## 2018-09-14 NOTE — Patient Instructions (Addendum)
Postnasal Drip Postnasal drip is the feeling of mucus going down the back of your throat. Mucus is a slimy substance that moistens and cleans your nose and throat, as well as the air pockets in face bones near your forehead and cheeks (sinuses). Small amounts of mucus pass from your nose and sinuses down the back of your throat all the time. This is normal. When you produce too much mucus or the mucus gets too thick, you can feel it. Some common causes of postnasal drip include:  Having more mucus because of: ? A cold or the flu. ? Allergies. ? Cold air. ? Certain medicines.  Having more mucus that is thicker because of: ? A sinus or nasal infection. ? Dry air. ? A food allergy. Follow these instructions at home: Relieving discomfort   Gargle with a salt-water mixture 3-4 times a day or as needed. To make a salt-water mixture, completely dissolve -1 tsp of salt in 1 cup of warm water.  If the air in your home is dry, use a humidifier to add moisture to the air.  Use a saline spray or container (neti pot) to flush out the nose (nasal irrigation). These methods can help clear away mucus and keep the nasal passages moist. General instructions  Take over-the-counter and prescription medicines only as told by your health care provider.  Follow instructions from your health care provider about eating or drinking restrictions. You may need to avoid caffeine.  Avoid things that you know you are allergic to (allergens), like dust, mold, pollen, pets, or certain foods.  Drink enough fluid to keep your urine pale yellow.  Keep all follow-up visits as told by your health care provider. This is important. Contact a health care provider if:  You have a fever.  You have a sore throat.  You have difficulty swallowing.  You have headache.  You have sinus pain.  You have a cough that does not go away.  The mucus from your nose becomes thick and is green or yellow in color.  You have  cold or flu symptoms that last more than 10 days. Summary  Postnasal drip is the feeling of mucus going down the back of your throat.  If your health care provider approves, use nasal irrigation or a nasal spray 2?4 times a day.  Avoid things that you know you are allergic to (allergens), like dust, mold, pollen, pets, or certain foods. This information is not intended to replace advice given to you by your health care provider. Make sure you discuss any questions you have with your health care provider. Document Released: 12/07/2016 Document Revised: 12/07/2016 Document Reviewed: 12/07/2016 Elsevier Interactive Patient Education  2019 [place], Adult The ears produce a substance called earwax that helps keep bacteria out of the ear and protects the skin in the ear canal. Occasionally, earwax can build up in the ear and cause discomfort or hearing loss. What increases the risk? This condition is more likely to develop in people who:  Are male.  Are elderly.  Naturally produce more earwax.  Clean their ears often with cotton swabs.  Use earplugs often.  Use in-ear headphones often.  Wear hearing aids.  Have narrow ear canals.  Have earwax that is overly thick or sticky.  Have eczema.  Are dehydrated.  Have excess hair in the ear canal. What are the signs or symptoms? Symptoms of this condition include:  Reduced or muffled hearing.  A feeling of fullness  in the ear or feeling that the ear is plugged.  Fluid coming from the ear.  Ear pain.  Ear itch.  Ringing in the ear.  Coughing.  An obvious piece of earwax that can be seen inside the ear canal. How is this diagnosed? This condition may be diagnosed based on:  Your symptoms.  Your medical history.  An ear exam. During the exam, your health care provider will look into your ear with an instrument called an otoscope. You may have tests, including a hearing test. How is this  treated? This condition may be treated by:  Using ear drops to soften the earwax.  Having the earwax removed by a health care provider. The health care provider may: ? Flush the ear with water. ? Use an instrument that has a loop on the end (curette). ? Use a suction device.  Surgery to remove the wax buildup. This may be done in severe cases. Follow these instructions at home:   Take over-the-counter and prescription medicines only as told by your health care provider.  Do not put any objects, including cotton swabs, into your ear. You can clean the opening of your ear canal with a washcloth or facial tissue.  Follow instructions from your health care provider about cleaning your ears. Do not over-clean your ears.  Drink enough fluid to keep your urine clear or pale yellow. This will help to thin the earwax.  Keep all follow-up visits as told by your health care provider. If earwax builds up in your ears often or if you use hearing aids, consider seeing your health care provider for routine, preventive ear cleanings. Ask your health care provider how often you should schedule your cleanings.  If you have hearing aids, clean them according to instructions from the manufacturer and your health care provider. Contact a health care provider if:  You have ear pain.  You develop a fever.  You have blood, pus, or other fluid coming from your ear.  You have hearing loss.  You have ringing in your ears that does not go away.  Your symptoms do not improve with treatment.  You feel like the room is spinning (vertigo). Summary  Earwax can build up in the ear and cause discomfort or hearing loss.  The most common symptoms of this condition include reduced or muffled hearing and a feeling of fullness in the ear or feeling that the ear is plugged.  This condition may be diagnosed based on your symptoms, your medical history, and an ear exam.  This condition may be treated by using  ear drops to soften the earwax or by having the earwax removed by a health care provider.  Do not put any objects, including cotton swabs, into your ear. You can clean the opening of your ear canal with a washcloth or facial tissue. This information is not intended to replace advice given to you by your health care provider. Make sure you discuss any questions you have with your health care provider. Document Released: 10/01/2004 Document Revised: 08/05/2017 Document Reviewed: 11/04/2016 Elsevier Interactive Patient Education  2019 [employer]  Allergic Rhinitis, Adult Allergic rhinitis is a reaction to allergens in the air. Allergens are tiny specks (particles) in the air that cause your body to have an allergic reaction. This condition cannot be passed from person to person (is not contagious). Allergic rhinitis cannot be cured, but it can be controlled. There are two types of allergic rhinitis:  Seasonal. This type is also  called hay fever. It happens only during certain times of the year.  Perennial. This type can happen at any time of the year. What are the causes? This condition may be caused by:  Pollen from grasses, trees, and weeds.  House dust mites.  Pet dander.  Mold. What are the signs or symptoms? Symptoms of this condition include:  Sneezing.  Runny or stuffy nose (nasal congestion).  A lot of mucus in the back of the throat (postnasal drip).  Itchy nose.  Tearing of the eyes.  Trouble sleeping.  Being sleepy during day. How is this treated? There is no cure for this condition. You should avoid things that trigger your symptoms (allergens). Treatment can help to relieve symptoms. This may include:  Medicines that block allergy symptoms, such as antihistamines. These may be given as a shot, nasal spray, or pill.  Shots that are given until your body becomes less sensitive to the allergen (desensitization).  Stronger medicines, if all other treatments  have not worked. Follow these instructions at home: Avoiding allergens   Find out what you are allergic to. Common allergens include smoke, dust, and pollen.  Avoid them if you can. These are some of the things that you can do to avoid allergens: ? Replace carpet with wood, tile, or vinyl flooring. Carpet can trap dander and dust. ? Clean any mold found in the home. ? Do not smoke. Do not allow smoking in your home. ? Change your heating and air conditioning filter at least once a month. ? During allergy season:  Keep windows closed as much as you can. If possible, use air conditioning when there is a lot of pollen in the air.  Use a special filter for allergies with your furnace and air conditioner.  Plan outdoor activities when pollen counts are lowest. This is usually during the early morning or evening hours.  If you do go outdoors when pollen count is high, wear a special mask for people with allergies.  When you come indoors, take a shower and change your clothes before sitting on furniture or bedding. General instructions  Do not use fans in your home.  Do not hang clothes outside to dry.  Wear sunglasses to keep pollen out of your eyes.  Wash your hands right away after you touch household pets.  Take over-the-counter and prescription medicines only as told by your doctor.  Keep all follow-up visits as told by your doctor. This is important. Contact a doctor if:  You have a fever.  You have a cough that does not go away (is persistent).  You start to make whistling sounds when you breathe (wheeze).  Your symptoms do not get better with treatment.  You have thick fluid coming from your nose.  You start to have nosebleeds. Get help right away if:  Your tongue or your lips are swollen.  You have trouble breathing.  You feel dizzy or you feel like you are going to pass out (faint).  You have cold sweats. Summary  Allergic rhinitis is a reaction to  allergens in the air.  This condition may be caused by allergens. These include pollen, dust mites, pet dander, and mold.  Symptoms include a runny, itchy nose, sneezing, or tearing eyes. You may also have trouble sleeping or feel sleepy during the day.  Treatment includes taking medicines and avoiding allergens. You may also get shots or take stronger medicines.  Get help if you have a fever or a cough that  does not stop. Get help right away if you are short of breath. This information is not intended to replace advice given to you by your health care provider. Make sure you discuss any questions you have with your health care provider. Document Released: 12/24/2010 Document Revised: 03/15/2018 Document Reviewed: 03/15/2018 Elsevier Interactive Patient Education  2019 [employer].

## 2018-09-14 NOTE — Progress Notes (Signed)
Subjective:    Patient ID: Jay Webster, male    DOB: 1932-07-20, 85 y.o.   MRN: [id number]  No chief complaint on file.   HPI Patient was seen today for ongoing concern.  Patient endorses rhinorrhea x2 weeks.  Also notes occasional ear pain/pressure.  Pt is not taking anything for his symptoms.  Possible sick contacts include patient's aunt during recent ED visit.  Pt was seen in ED for hematoma of cheek--now resolved.  Pt also expresses concern about enlargement of skin on left side of anterior neck.  Pt has an upcoming appointment with his surgeon for further evaluation.  Otherwise patient states he is doing well, does note recent burn on finger.  Using neosporin.  He hopes to be nearing completion of cancer treatment.  Pt has a history of papillary thyroid cancer and diffuse large B-cell lymphoma.  Followed by oncology at [facility]. Past Medical History:  Diagnosis Date  . Arthritis    OA AND PAIN LEFT HIP AND OTHER JOINT PAINS  . Cancer (HCC)    THYROID CANCER - HAD THYROID REMOVED  . History of kidney stones   . History of shingles    RESOLVED  . Hypertension   . Hypothyroidism   . Low back pain   . Macular degeneration of both eyes   . RBBB (right bundle branch block with left anterior fascicular block)   . Seasonal allergies   . Thyroid disease   . Uric acid renal calculus 06/20/2007   Qualifier: Diagnosis of  By: Rogue Bussing CMA, Jacqualynn      Allergies  Allergen Reactions  . Penicillins Swelling, Palpitations and Other (See Comments)    Swelling of joints. Has patient had a PCN reaction causing immediate rash, facial/tongue/throat swelling, SOB or lightheadedness with hypotension: Yes Has patient had a PCN reaction causing severe rash involving mucus membranes or skin necrosis: No Has patient had a PCN reaction that required hospitalization: Yes Has patient had a PCN reaction occurring within the last 10 years: No If all of the above answers are "NO", then may  proceed with Cephalosporin use.     ROS General: Denies fever, chills, night sweats, changes in weight, changes in appetite HEENT: Denies headaches, changes in vision, rhinorrhea, sore throat  + rhinorrhea, ear pain/pressure CV: Denies CP, palpitations, SOB, orthopnea Pulm: Denies SOB, cough, wheezing GI: Denies abdominal pain, nausea, vomiting, diarrhea, constipation GU: Denies dysuria, hematuria, frequency, vaginal discharge Msk: Denies muscle cramps, joint pains Neuro: Denies weakness, numbness, tingling Skin: Denies rashes, bruising  +burn on finger Psych: Denies depression, anxiety, hallucinations   Objective:    Blood pressure (!) 98/56, pulse 80, temperature 98 F (36.7 C), temperature source Oral, weight 170 lb (77.1 kg), SpO2 97 %.  Gen. Pleasant, well-nourished, in no distress, normal affect  HEENT: [place]/AT, face symmetric, conjunctiva clear, no scleral icterus, PERRLA, EOMI, nares patent without drainage, pharynx without erythema or exudate.  Left TM normal.  Right canal with cerumen.  A 4 mm pustule noted in the right canal, nontender.  Right TM normal after irrigation. Neck: enlargement of skin of L neck/chin, tissue of a fatty consistency with a 1 cm nodule noted superiorly Lungs: no accessory muscle use, CTAB, no wheezes or rales Cardiovascular: RRR, no m/r/g, no peripheral edema.   Abdomen: BS present, soft, NT/ND Neuro:  A&Ox3, CN II-XII intact, normal gait Skin:  Warm, no lesions/ rash. right upper chest with port in place.   Wt Readings from Last 3 Encounters:  09/14/18 170 lb (77.1 kg)  08/29/18 171 lb 15.3 oz (78 kg)  08/22/18 172 lb (78 kg)    Lab Results  Component Value Date   WBC 6.7 09/08/2018   HGB 9.9 (L) 09/08/2018   HCT 30.7 (L) 09/08/2018   PLT 283 09/08/2018   GLUCOSE 111 (H) 09/08/2018   CHOL 123 01/17/2018   TRIG 67.0 01/17/2018   HDL 49.40 01/17/2018   LDLCALC 60 01/17/2018   ALT 14 09/08/2018   AST 17 09/08/2018   NA 139 09/08/2018    K 4.1 09/08/2018   CL 107 09/08/2018   CREATININE 0.75 09/08/2018   BUN 13 09/08/2018   CO2 25 09/08/2018   TSH 0.17 (L) 01/17/2018   PSA 1.07 12/31/2015   INR 1.12 05/05/2018    Assessment/Plan:  Acute rhinitis -likely 2/2 allergic cause -Discussed restarting Zyrtec daily to see if symptoms improve -Can also consider allergy nasal spray or saline nasal rinse. -Given handout  Postnasal drip -Discussed using Zyrtec or nasal spray to reduce symptoms -Given handout  Impacted cerumen of right ear -Consent obtained.  Right ear irrigated.  Patient tolerated procedure well. -Given handout -Encouraged to stay hydrated  Neck enlargement -Patient has follow-up scheduled this week with his surgeon for further evaluation. -Encouraged to keep this appointment -May benefit from imaging.  Follow-up PRN  Grier Mitts, MD

## 2018-09-15 DIAGNOSIS — H353231 Exudative age-related macular degeneration, bilateral, with active choroidal neovascularization: Secondary | ICD-10-CM | POA: Diagnosis not present

## 2018-09-15 DIAGNOSIS — H35371 Puckering of macula, right eye: Secondary | ICD-10-CM | POA: Diagnosis not present

## 2018-09-15 DIAGNOSIS — H35723 Serous detachment of retinal pigment epithelium, bilateral: Secondary | ICD-10-CM | POA: Diagnosis not present

## 2018-09-16 ENCOUNTER — Encounter: Payer: Self-pay | Admitting: Family Medicine

## 2018-09-17 ENCOUNTER — Emergency Department (HOSPITAL_COMMUNITY)
Admission: EM | Admit: 2018-09-17 | Discharge: 2018-09-17 | Disposition: A | Discharge status: Home / Self Care | Payer: Medicare PPO | Attending: Emergency Medicine | Admitting: Emergency Medicine

## 2018-09-17 ENCOUNTER — Encounter (HOSPITAL_COMMUNITY): Payer: Self-pay | Admitting: Emergency Medicine

## 2018-09-17 ENCOUNTER — Emergency Department (HOSPITAL_COMMUNITY): Payer: Medicare PPO

## 2018-09-17 ENCOUNTER — Other Ambulatory Visit: Payer: Self-pay

## 2018-09-17 DIAGNOSIS — Z85850 Personal history of malignant neoplasm of thyroid: Secondary | ICD-10-CM | POA: Insufficient documentation

## 2018-09-17 DIAGNOSIS — E039 Hypothyroidism, unspecified: Secondary | ICD-10-CM | POA: Insufficient documentation

## 2018-09-17 DIAGNOSIS — Z79899 Other long term (current) drug therapy: Secondary | ICD-10-CM | POA: Insufficient documentation

## 2018-09-17 DIAGNOSIS — Z87891 Personal history of nicotine dependence: Secondary | ICD-10-CM | POA: Insufficient documentation

## 2018-09-17 DIAGNOSIS — Z96642 Presence of left artificial hip joint: Secondary | ICD-10-CM | POA: Insufficient documentation

## 2018-09-17 DIAGNOSIS — R509 Fever, unspecified: Secondary | ICD-10-CM | POA: Diagnosis present

## 2018-09-17 DIAGNOSIS — I10 Essential (primary) hypertension: Secondary | ICD-10-CM | POA: Insufficient documentation

## 2018-09-17 DIAGNOSIS — J309 Allergic rhinitis, unspecified: Secondary | ICD-10-CM | POA: Insufficient documentation

## 2018-09-17 DIAGNOSIS — R05 Cough: Secondary | ICD-10-CM | POA: Diagnosis not present

## 2018-09-17 LAB — CBC WITH DIFFERENTIAL/PLATELET
ABS IMMATURE GRANULOCYTES: 0.05 10*3/uL (ref 0.00–0.07)
Basophils Absolute: 0.1 10*3/uL (ref 0.0–0.1)
Basophils Relative: 1 %
Eosinophils Absolute: 0.1 10*3/uL (ref 0.0–0.5)
Eosinophils Relative: 1 %
HCT: 30.2 % — ABNORMAL LOW (ref 39.0–52.0)
Hemoglobin: 10 g/dL — ABNORMAL LOW (ref 13.0–17.0)
Immature Granulocytes: 1 %
Lymphocytes Relative: 12 %
Lymphs Abs: 1.1 10*3/uL (ref 0.7–4.0)
MCH: 32.4 pg (ref 26.0–34.0)
MCHC: 33.1 g/dL (ref 30.0–36.0)
MCV: 97.7 fL (ref 80.0–100.0)
Monocytes Absolute: 1.4 10*3/uL — ABNORMAL HIGH (ref 0.1–1.0)
Monocytes Relative: 16 %
NEUTROS ABS: 6.5 10*3/uL (ref 1.7–7.7)
Neutrophils Relative %: 69 %
Platelets: 301 10*3/uL (ref 150–400)
RBC: 3.09 MIL/uL — ABNORMAL LOW (ref 4.22–5.81)
RDW: 14.2 % (ref 11.5–15.5)
WBC: 9.2 10*3/uL (ref 4.0–10.5)
nRBC: 0 % (ref 0.0–0.2)

## 2018-09-17 LAB — COMPREHENSIVE METABOLIC PANEL
ALT: 13 U/L (ref 0–44)
AST: 16 U/L (ref 15–41)
Albumin: 3.7 g/dL (ref 3.5–5.0)
Alkaline Phosphatase: 75 U/L (ref 38–126)
Anion gap: 8 (ref 5–15)
BUN: 16 mg/dL (ref 8–23)
CO2: 25 mmol/L (ref 22–32)
Calcium: 8.8 mg/dL — ABNORMAL LOW (ref 8.9–10.3)
Chloride: 104 mmol/L (ref 98–111)
Creatinine, Ser: 0.65 mg/dL (ref 0.61–1.24)
GFR calc Af Amer: >60 mL/min (ref >60)
GFR calc non Af Amer: >60 mL/min (ref >60)
GLUCOSE: 110 mg/dL — AB (ref 70–99)
POTASSIUM: 3.7 mmol/L (ref 3.5–5.1)
Sodium: 137 mmol/L (ref 135–145)
Total Bilirubin: 0.6 mg/dL (ref 0.3–1.2)
Total Protein: 6.5 g/dL (ref 6.5–8.1)

## 2018-09-17 LAB — I-STAT CG4 LACTIC ACID, ED: Lactic Acid, Venous: 0.88 mmol/L (ref 0.5–1.9)

## 2018-09-17 MED ORDER — FLUTICASONE PROPIONATE 50 MCG/ACT NA SUSP: 2.0000 | Freq: Every day | NASAL | 0 refills | Status: DC

## 2018-09-17 MED ORDER — ACETAMINOPHEN 325 MG PO TABS
650.0000 mg | ORAL_TABLET | Freq: Once | ORAL | Status: AC
  Administered 2018-09-17: 650 mg via ORAL
  Filled 2018-09-17: qty 2

## 2018-09-17 NOTE — ED Notes (Signed)
Patient transported to X-ray 

## 2018-09-17 NOTE — ED Triage Notes (Addendum)
Patient is a cancer patient that woke up around 0200 with a fever. Patient had last chemo 12/18. Patient complains of nasal congestion and drainage and a small productive cough with white sputum. Patient denies N/V/D and SOB.

## 2018-09-17 NOTE — ED Notes (Signed)
1 set of blood cultures sent to lab with blood work

## 2018-09-17 NOTE — ED Notes (Signed)
Pt provided urinal, informed of need for urine sample.  

## 2018-09-17 NOTE — Discharge Instructions (Signed)
With testing does not show any serious complications.  We have given you a prescription for Flonase to help with the nasal congestion.  You can also use your Zyrtec if needed.  Sure you are drinking plenty of water every day.  Follow-up with your doctor for problems.

## 2018-09-17 NOTE — ED Provider Notes (Signed)
[facility] DEPT Provider Note   CSN: [id number] Arrival date & time: 09/17/18  0555     History   Chief Complaint Chief Complaint  Patient presents with  . Fever  . Cancer patient    HPI Jay Webster is a 85 y.o. male.  HPI   He is here for evaluation of rhinorrhea which is clear.  He was worried that this is a problem with his immunosuppression secondary to chemotherapy.  He feels like he has had a low-grade fever.  He denies cough, shortness of breath, weakness or dizziness.  Last chemotherapy was about 4 weeks ago.  No other recent problems.  There are no other known modifying factors.  Past Medical History:  Diagnosis Date  . Arthritis    OA AND PAIN LEFT HIP AND OTHER JOINT PAINS  . Cancer (HCC)    THYROID CANCER - HAD THYROID REMOVED  . History of kidney stones   . History of shingles    RESOLVED  . Hypertension   . Hypothyroidism   . Low back pain   . Macular degeneration of both eyes   . RBBB (right bundle branch block with left anterior fascicular block)   . Seasonal allergies   . Thyroid disease   . Uric acid renal calculus 06/20/2007   Qualifier: Diagnosis of  By: Rogue Bussing CMA, Jacqualynn      Patient Active Problem List   Diagnosis Date Noted  . Diffuse large B-cell lymphoma ([place]) 03/30/2018  . Perineal mass, male 03/11/2018  . Perineal mass in male 03/08/2018  . Degeneration of lumbar intervertebral disc 10/05/2017  . History of total hip arthroplasty 10/05/2017  . Combined forms of age-related cataract of both eyes 04/12/2014  . OA (osteoarthritis) of hip 05/31/2013  . History of revision of total replacement of left hip joint 05/31/2013  . High risk medication use 10/25/2012  . Vitreomacular adhesion 05/24/2012  . Posterior vitreous detachment 04/12/2012  . Cystoid macular edema 11/10/2011  . Subretinal neovascularization of macula 09/11/2011  . Retinal pigment epithelial detachment, bilateral 09/11/2011  .  Macular degeneration of both eyes 06/09/2011  . History of thyroid cancer 04/21/2011  . Macular degeneration 01/05/2011  . CARCINOMA, THYROID GLAND, PAPILLARY 11/20/2008  . HYPOTHYROIDISM, POSTSURGICAL 11/20/2008  . Osteoarthritis 07/01/2007  . LOW BACK PAIN 07/01/2007  . Uric acid renal calculus 06/20/2007  . Essential hypertension 06/20/2007    Past Surgical History:  Procedure Laterality Date  . APPENDECTOMY  1948  . COLONOSCOPY    . Elbow Radical Reduction  1973  . IR IMAGING GUIDED PORT INSERTION  05/05/2018  . [place]  . MASS EXCISION N/A 03/11/2018   Procedure: EXCISION MASS OF PERINEUM;  Surgeon: Armandina Gemma, MD;  Location: [facility];  Service: General;  Laterality: N/A;  . REMOVAL OF FIRST RIB   NOV 1994   FOR COMPRESSED VEIN BETWEEN 1 ST RIB AND COLLARBONE  . Rib removed, 1st  1994  . ROTATOR CUFF REPAIR  2009  . THYROIDECTOMY  2010  . TOTAL HIP ARTHROPLASTY Left 05/31/2013   Procedure: LEFT TOTAL HIP ARTHROPLASTY ANTERIOR APPROACH;  Surgeon: Gearlean Alf, MD;  Location: [facility];  Service: Orthopedics;  Laterality: Left;        Home Medications    Prior to Admission medications   Medication Sig Start Date End Date Taking? Authorizing Provider  acetaminophen (TYLENOL) 325 MG tablet Take 325-650 mg by mouth every 6 (six) hours as needed  for headache.     [provider]  Aflibercept (EYLEA) 2 MG/0.05ML SOLN Inject 1 Dose into the eye every 8 (eight) weeks.     [provider]  allopurinol (ZYLOPRIM) 300 MG tablet TAKE 1 TABLET EVERY DAY Patient taking differently: Take 300 mg by mouth daily after supper.  06/14/18   Billie Ruddy, MD  benazepril (LOTENSIN) 20 MG tablet TAKE 1 TABLET EVERY DAY Patient taking differently: Take 20 mg by mouth daily.  06/14/18   Billie Ruddy, MD  cetirizine (ZYRTEC) 10 MG tablet Take 10 mg by mouth daily as needed for allergies.    [provider]  fluticasone (FLONASE) 50 MCG/ACT  nasal spray Place 2 sprays into both nostrils daily. 09/17/18   Daleen Bo, MD  Glucosamine-Chondroitin (OSTEO BI-FLEX REGULAR STRENGTH PO) Take 1 tablet by mouth 2 (two) times daily.    [provider]  levothyroxine (SYNTHROID, LEVOTHROID) 125 MCG tablet Take 1 tablet (125 mcg total) by mouth daily. 01/17/18   Marletta Lor, MD  lidocaine-prilocaine (EMLA) cream Apply to affected area once Patient not taking: Reported on 08/15/2018 05/04/18   Brunetta Genera, MD  Multiple Vitamin (MULTIVITAMIN) tablet Take 1 tablet by mouth daily.    [provider]  Multiple Vitamins-Minerals (PRESERVISION AREDS 2 PO) Take 1 tablet by mouth 2 (two) times daily.    [provider]  Polyethyl Glycol-Propyl Glycol (SYSTANE OP) Place 1 drop into both eyes daily as needed (irritation).    [provider]  sodium chloride (OCEAN) 0.65 % SOLN nasal spray Place 1 spray into both nostrils as needed for congestion.    [provider]    Family History Family History  Problem Relation Age of Onset  . Heart disease Mother   . Diabetes Mother   . Anemia Other   . Thyroid disease Other     Social History Social History   Tobacco Use  . Smoking status: Former Smoker    Types: Cigarettes    Last attempt to quit: 02/22/1979    Years since quitting: 39.5  . Smokeless tobacco: Never Used  Substance Use Topics  . Alcohol use: Yes    Alcohol/week: 0.0 standard drinks  . Drug use: No     Allergies   Penicillins   Review of Systems Review of Systems  All other systems reviewed and are negative.    Physical Exam Updated Vital Signs BP 128/70   Pulse 90   Temp 100 F (37.8 C) (Oral)   Resp 20   SpO2 100%   Physical Exam Vitals signs and nursing note reviewed.  Constitutional:      General: He is not in acute distress.    Appearance: He is well-developed. He is not ill-appearing or diaphoretic.  HENT:     Head: Normocephalic and atraumatic.      Right Ear: External ear normal.     Left Ear: External ear normal.     Nose: Nose normal. No congestion or rhinorrhea.     Comments: No abnormality of the anterior nares.    Mouth/Throat:     Mouth: Mucous membranes are moist.     Pharynx: No oropharyngeal exudate or posterior oropharyngeal erythema.  Eyes:     Conjunctiva/sclera: Conjunctivae normal.     Pupils: Pupils are equal, round, and reactive to light.  Neck:     Musculoskeletal: Normal range of motion and neck supple.     Trachea: Phonation normal.  Cardiovascular:  Rate and Rhythm: Normal rate and regular rhythm.     Heart sounds: Normal heart sounds.  Pulmonary:     Effort: Pulmonary effort is normal.     Breath sounds: Normal breath sounds.  Abdominal:     Palpations: Abdomen is soft.     Tenderness: There is no abdominal tenderness.  Musculoskeletal: Normal range of motion.  Skin:    General: Skin is warm and dry.  Neurological:     Mental Status: He is alert and oriented to person, place, and time.     Cranial Nerves: No cranial nerve deficit.     Sensory: No sensory deficit.     Motor: No abnormal muscle tone.     Coordination: Coordination normal.  Psychiatric:        Behavior: Behavior normal.        Thought Content: Thought content normal.        Judgment: Judgment normal.      ED Treatments / Results  Labs (all labs ordered are listed, but only abnormal results are displayed) Labs Reviewed  COMPREHENSIVE METABOLIC PANEL - Abnormal; Notable for the following components:      Result Value   Glucose, Bld 110 (*)    Calcium 8.8 (*)    All other components within normal limits  CBC WITH DIFFERENTIAL/PLATELET - Abnormal; Notable for the following components:   RBC 3.09 (*)    Hemoglobin 10.0 (*)    HCT 30.2 (*)    Monocytes Absolute 1.4 (*)    All other components within normal limits  CULTURE, BLOOD (ROUTINE X 2)  URINALYSIS, ROUTINE W REFLEX MICROSCOPIC  I-STAT CG4 LACTIC ACID, ED     EKG None  Radiology Dg Chest 2 View  Result Date: 09/17/2018 CLINICAL DATA:  Fever, cough. EXAM: CHEST - 2 VIEW COMPARISON:  Radiographs of September 18, 2014. FINDINGS: The heart size and mediastinal contours are within normal limits. Both lungs are clear. Right internal jugular Port-A-Cath is noted with tip in expected position of cavoatrial junction. The visualized skeletal structures are unremarkable. IMPRESSION: No active cardiopulmonary disease. Electronically Signed   By: Marijo Conception, M.D.   On: 09/17/2018 07:30    Procedures Procedures (including critical care time)  Medications Ordered in ED Medications  acetaminophen (TYLENOL) tablet 650 mg (650 mg Oral Given 09/17/18 0726)     Initial Impression / Assessment and Plan / ED Course  I have reviewed the triage vital signs and the nursing notes.  Pertinent labs & imaging results that were available during my care of the patient were reviewed by me and considered in my medical decision making (see chart for details).  Clinical Course as of Sep 17 821  Sat Sep 17, 2018  7654 Normal except hemoglobin low  CBC with Differential(!) [EW]  6503 Normal  I-Stat CG4 Lactic Acid, ED [EW]  0817 Normal except glucose high, calcium  Comprehensive metabolic panel(!) [EW]  5465 No infiltrate or CHF, images reviewed by me  DG Chest 2 View [EW]    Clinical Course User Index [EW] Daleen Bo, MD     Patient Vitals for the past 24 hrs:  BP Temp Temp src Pulse Resp SpO2  09/17/18 0701 128/70 - - 90 20 100 %  09/17/18 0612 140/79 100 F (37.8 C) Oral (!) 104 16 100 %    8:19 AM Reevaluation with update and discussion. After initial assessment and treatment, an updated evaluation reveals no additional complaints.  Findings assessed the patient all questions  answered. Daleen Bo   Medical Decision Making: Evaluation consistent with rhinorrhea without complicating features.  Doubt sinusitis.  Suspect allergic  rhinitis.  CRITICAL CARE-no Performed by: Daleen Bo  Nursing Notes Reviewed/ Care Coordinated Applicable Imaging Reviewed Interpretation of Laboratory Data incorporated into ED treatment  The patient appears reasonably screened and/or stabilized for discharge and I doubt any other medical condition or other [facility] requiring further screening, evaluation, or treatment in the ED at this time prior to discharge.  Plan: Home Medications-continue usual medications; Home Treatments-rest, fluids; return here if the recommended treatment, does not improve the symptoms; Recommended follow up-PCP,.    Final Clinical Impressions(s) / ED Diagnoses   Final diagnoses:  None    ED Discharge Orders         Ordered    fluticasone (FLONASE) 50 MCG/ACT nasal spray  Daily     09/17/18 3361           Daleen Bo, MD 09/17/18 [phone]

## 2018-09-20 ENCOUNTER — Encounter: Payer: Self-pay | Admitting: Family Medicine

## 2018-09-20 ENCOUNTER — Ambulatory Visit: Payer: Medicare PPO | Admitting: Family Medicine

## 2018-09-20 ENCOUNTER — Other Ambulatory Visit: Payer: Self-pay

## 2018-09-20 VITALS — BP 98/56 | HR 86 | Temp 97.9°F | Ht 64.0 in | Wt 168.5 lb

## 2018-09-20 DIAGNOSIS — R040 Epistaxis: Secondary | ICD-10-CM | POA: Diagnosis not present

## 2018-09-20 NOTE — Progress Notes (Signed)
Subjective:     Patient ID: Jay Webster, male   DOB: [birth year], 85 y.o.   MRN: [id number]  HPI Patient seen as a work in with couple day history of some epistaxis.  Bleeding is been relatively mild.  He denies any hemoptysis.  He has diagnosis of diffuse large B-cell lymphoma followed by hematology.  He had labs done as well as chest x-ray on the 11th.  CBC hemoglobin 10.0 with normal platelets.  He does not take any aspirin or anticoagulants.  Had been using Flonase but stopped this on his own.  Nosebleed seems getting better.  He is not sure which nostril.  No heavy bleeding.  Past Medical History:  Diagnosis Date  . Arthritis    OA AND PAIN LEFT HIP AND OTHER JOINT PAINS  . Cancer (HCC)    THYROID CANCER - HAD THYROID REMOVED  . History of kidney stones   . History of shingles    RESOLVED  . Hypertension   . Hypothyroidism   . Low back pain   . Macular degeneration of both eyes   . RBBB (right bundle branch block with left anterior fascicular block)   . Seasonal allergies   . Thyroid disease   . Uric acid renal calculus 06/20/2007   Qualifier: Diagnosis of  By: Rogue Bussing CMA, Maryann Alar     Past Surgical History:  Procedure Laterality Date  . APPENDECTOMY  1948  . COLONOSCOPY    . Elbow Radical Reduction  1973  . IR IMAGING GUIDED PORT INSERTION  05/05/2018  . [place]  . MASS EXCISION N/A 03/11/2018   Procedure: EXCISION MASS OF PERINEUM;  Surgeon: Armandina Gemma, MD;  Location: [facility];  Service: General;  Laterality: N/A;  . REMOVAL OF FIRST RIB   NOV 1994   FOR COMPRESSED VEIN BETWEEN 1 ST RIB AND COLLARBONE  . Rib removed, 1st  1994  . ROTATOR CUFF REPAIR  2009  . THYROIDECTOMY  2010  . TOTAL HIP ARTHROPLASTY Left 05/31/2013   Procedure: LEFT TOTAL HIP ARTHROPLASTY ANTERIOR APPROACH;  Surgeon: Gearlean Alf, MD;  Location: [facility];  Service: Orthopedics;  Laterality: Left;    reports that he quit smoking about 39 years ago. His smoking use  included cigarettes. He has never used smokeless tobacco. He reports current alcohol use. He reports that he does not use drugs. family history includes Anemia in an other family member; Diabetes in his mother; Heart disease in his mother; Thyroid disease in an other family member. Allergies  Allergen Reactions  . Penicillins Swelling, Palpitations and Other (See Comments)    Swelling of joints. Has patient had a PCN reaction causing immediate rash, facial/tongue/throat swelling, SOB or lightheadedness with hypotension: Yes Has patient had a PCN reaction causing severe rash involving mucus membranes or skin necrosis: No Has patient had a PCN reaction that required hospitalization: Yes Has patient had a PCN reaction occurring within the last 10 years: No If all of the above answers are "NO", then may proceed with Cephalosporin use.      Review of Systems  Constitutional: Negative for chills and fever.  HENT: Positive for nosebleeds. Negative for sinus pressure and sinus pain.   Respiratory: Negative for cough, shortness of breath and wheezing.   Cardiovascular: Negative for chest pain.       Objective:   Physical Exam Constitutional:      Appearance: Normal appearance.  HENT:     Right Ear: Tympanic membrane  normal.     Left Ear: Tympanic membrane normal.     Nose:     Comments: Nasal mucosa is fairly unremarkable.  No obvious source of nosebleed noted.  No active bleeding at this time. Neck:     Musculoskeletal: Neck supple.  Cardiovascular:     Rate and Rhythm: Normal rate and regular rhythm.  Pulmonary:     Effort: Pulmonary effort is normal.     Breath sounds: Normal breath sounds.  Neurological:     Mental Status: He is alert.        Assessment:     Epistaxis.  Possibly related to Flonase.  No anticoagulant or aspirin use    Plan:     -Discontinue Flonase -Consider Ocean mist nasal saline especially at night -Follow-up for any persistent nasal bleed  Eulas Post MD [facility]

## 2018-09-20 NOTE — Patient Instructions (Signed)
HOLD the Flonase for now  Consider over the counter nasal saline spray such as Ocean Mist nasal.

## 2018-09-22 ENCOUNTER — Ambulatory Visit (HOSPITAL_COMMUNITY)
Admission: RE | Admit: 2018-09-22 | Discharge: 2018-09-22 | Disposition: A | Discharge status: Home / Self Care | Payer: Medicare PPO | Source: Ambulatory Visit | Attending: Hematology | Admitting: Hematology

## 2018-09-22 ENCOUNTER — Encounter (HOSPITAL_COMMUNITY): Payer: Medicare PPO

## 2018-09-22 DIAGNOSIS — C8330 Diffuse large B-cell lymphoma, unspecified site: Secondary | ICD-10-CM | POA: Diagnosis not present

## 2018-09-22 DIAGNOSIS — C8333 Diffuse large B-cell lymphoma, intra-abdominal lymph nodes: Secondary | ICD-10-CM | POA: Diagnosis not present

## 2018-09-22 DIAGNOSIS — Z5111 Encounter for antineoplastic chemotherapy: Secondary | ICD-10-CM

## 2018-09-22 LAB — CULTURE, BLOOD (ROUTINE X 2): Culture: NO GROWTH

## 2018-09-22 LAB — GLUCOSE, CAPILLARY: Glucose-Capillary: 111 mg/dL — ABNORMAL HIGH (ref 70–99)

## 2018-09-22 MED ORDER — FLUDEOXYGLUCOSE F - 18 (FDG) INJECTION
8.3000 | Freq: Once | INTRAVENOUS | Status: AC | PRN
  Administered 2018-09-22: 8.3 via INTRAVENOUS

## 2018-09-27 ENCOUNTER — Telehealth: Payer: Self-pay

## 2018-09-27 NOTE — Telephone Encounter (Signed)
Spoke with pt scheduled for 09/29/2017 at 9 am for his right big toe infection, pt verbalized understanding

## 2018-09-27 NOTE — Telephone Encounter (Signed)
Copied from [place] [id number]. Topic: Referral - Request for Referral >> Sep 27, 2018  9:50 AM Reyne Dumas L wrote: Has patient seen PCP for this complaint? no *If NO, is insurance requiring patient see PCP for this issue before PCP can refer them? Referral for which specialty: podiatrist Preferred provider/office: no preference Reason for referral: right foot, big toenail is lose and has a bad smell  Pt can be reached at [phone]

## 2018-09-28 ENCOUNTER — Other Ambulatory Visit: Payer: Self-pay | Admitting: Family Medicine

## 2018-09-28 DIAGNOSIS — L089 Local infection of the skin and subcutaneous tissue, unspecified: Secondary | ICD-10-CM

## 2018-09-29 ENCOUNTER — Ambulatory Visit: Payer: Medicare PPO | Admitting: Family Medicine

## 2018-09-29 NOTE — Progress Notes (Signed)
HEMATOLOGY/ONCOLOGY CLINIC NOTE  Date of Service: 09/30/2018  Patient Care Team: Billie Ruddy, MD as PCP - General (Family Medicine)  CHIEF COMPLAINTS/PURPOSE OF CONSULTATION:  Diffuse Large B-Cell Lymphoma  HISTORY OF PRESENTING ILLNESS:   Jay Webster is a wonderful 85 y.o. male who has been referred to [place] by surgeon Dr. Armandina Gemma for evaluation and management of Diffuse Large B-Cell Lymphoma. The pt reports that he is doing well overall.   Of note prior to the patient's visit today, pt has had Perineum biopsy completed on 03/11/18 with results revealing Diffuse Large B-Cell Lymphoma. The pt had surgery to remove his irregularly shaped mass in the left anterior portion of the perineum with Dr. Armandina Gemma.  The pt reports first noticing the mass in his left buttock about 2 months ago, after a visit with Dr. Trena Platt. The pt decided to consult his surgeon Dr. Armandina Gemma whom he previously had a thyroidectomy with. The pt notes that the spot became as large as a golf ball and did not breach the skin. The pt notes that it wasn't terribly uncomfortable to sit on. He denies feeling any differently recently than in the past  6 months to a year.   The pt notes that he has been having some continued discharge and is using Depends. He is changing his gauze twice each day.   He denies any recent medical problems. He continues to function independently at home and lives alone, as his partner of 30+ years died in the past year. He notes that he does not have any family members or friends who he would want to include in his care. He denies any difficulties functioning day to day and denies concerns for his memory.   He has been on thyroid replacement for the last 10 years following a thyroidectomy after thyroid cancer, and was treated with one dose of radioactive iodine.   The pt also notes a uric acid kidney stone history which hasn't been a problem in many years.    Most  recent lab results (02/28/18) of CBC  is as follows: all values are WNL except for HCT at 38.3.  On review of systems, pt reports left buttock surgical wound, ganglion cyst on right wrist, and denies fevers, chills, night sweats, unexpected weight loss, new fatigue, pain along the spine, leg swelling, noticing any new lumps or bumps, and any other symptoms.   On PMHx the pt denies heart problems, strokes, abdominal problems, lung problems.  On Social Hx the pt reports that he quit smoking in 1990, and used to smoke 1-2 packs of cigarettes each day before this. He denies excess alcohol being a problem, and consumes one glass of wine rarely. 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  On Family Hx the pt reports brother died of sarcoma, different brother with Type I DM, and denies other cancer or blood disorders.  Interval History:    Jay Webster returns today for management and evaluation of his Diffuse Large B-Cell Lymphoma. The patient's last visit with [place] was on 08/22/18. The pt reports that he is doing well overall.  The pt reports that he has had a runny nose with clear nasal discharge and a cough in the interim and began Fluticasone which helped, denies sinus pain or pressure, fevers, chills, or night sweats. The pt also had a single episode of diarrhea in the interim, which has resolved.  Of  note since the patient's last visit, pt has had a PET/CT completed on 09/22/18 with results revealing No evidence of hypermetabolic active lymphoma. 2. Aortic atherosclerosis, coronary artery atherosclerosis and emphysema. 3. Left nephrolithiasis. 4. Sinus disease.  Lab results today (09/30/18) of CBC w/diff and CMP is as follows: all values are WNL except for RBC at 3.23, HGB at 10.1, HCT at 30.3, Glucose at 104, Albumin at 3.3. 09/30/18 LDH is . Lab Results  Component Value Date   LDH 153 09/30/2018   On review of systems, pt reports runny nose, cough, and  denies sinus pain, sinus pressure, fevers, chills, night sweats, abdominal pains, leg swelling, and any other symptoms.  MEDICAL HISTORY:  Past Medical History:  Diagnosis Date  . Arthritis    OA AND PAIN LEFT HIP AND OTHER JOINT PAINS  . Cancer (HCC)    THYROID CANCER - HAD THYROID REMOVED  . History of kidney stones   . History of shingles    RESOLVED  . Hypertension   . Hypothyroidism   . Low back pain   . Macular degeneration of both eyes   . RBBB (right bundle branch block with left anterior fascicular block)   . Seasonal allergies   . Thyroid disease   . Uric acid renal calculus 06/20/2007   Qualifier: Diagnosis of  By: Rogue Bussing CMA, Maryann Alar      SURGICAL HISTORY: Past Surgical History:  Procedure Laterality Date  . APPENDECTOMY  1948  . COLONOSCOPY    . Elbow Radical Reduction  1973  . IR IMAGING GUIDED PORT INSERTION  05/05/2018  . [place]  . MASS EXCISION N/A 03/11/2018   Procedure: EXCISION MASS OF PERINEUM;  Surgeon: Armandina Gemma, MD;  Location: [facility];  Service: General;  Laterality: N/A;  . REMOVAL OF FIRST RIB   NOV 1994   FOR COMPRESSED VEIN BETWEEN 1 ST RIB AND COLLARBONE  . Rib removed, 1st  1994  . ROTATOR CUFF REPAIR  2009  . THYROIDECTOMY  2010  . TOTAL HIP ARTHROPLASTY Left 05/31/2013   Procedure: LEFT TOTAL HIP ARTHROPLASTY ANTERIOR APPROACH;  Surgeon: Gearlean Alf, MD;  Location: [facility];  Service: Orthopedics;  Laterality: Left;    SOCIAL HISTORY: Social History   Socioeconomic History  . Marital status: Widowed    Spouse name: Not on file  . Number of children: Not on file  . Years of education: Not on file  . Highest education level: Not on file  Occupational History  . Occupation: retired  Scientific laboratory technician  . Financial resource strain: Not on file  . Food insecurity:    Worry: Not on file    Inability: Not on file  . Transportation needs:    Medical: Not on file    Non-medical: Not on file  Tobacco Use  .  Smoking status: Former Smoker    Types: Cigarettes    Last attempt to quit: 02/22/1979    Years since quitting: 39.6  . Smokeless tobacco: Never Used  Substance and Sexual Activity  . Alcohol use: Yes    Alcohol/week: 0.0 standard drinks  . Drug use: No  . Sexual activity: Yes  Lifestyle  . Physical activity:    Days per week: Not on file    Minutes per session: Not on file  . Stress: Not on file  Relationships  . Social connections:    Talks on phone: Not on file    Gets together: Not on file  Attends religious service: Not on file    Active member of club or organization: Not on file    Attends meetings of clubs or organizations: Not on file    Relationship status: Not on file  . Intimate partner violence:    Fear of current or ex partner: Not on file    Emotionally abused: Not on file    Physically abused: Not on file    Forced sexual activity: Not on file  Other Topics Concern  . Not on file  Social History Narrative  . Not on file    FAMILY HISTORY: Family History  Problem Relation Age of Onset  . Heart disease Mother   . Diabetes Mother   . Anemia Other   . Thyroid disease Other     ALLERGIES:  is allergic to penicillins.  MEDICATIONS:  Current Outpatient Medications  Medication Sig Dispense Refill  . acetaminophen (TYLENOL) 325 MG tablet Take 325-650 mg by mouth every 6 (six) hours as needed for headache.     . Aflibercept (EYLEA) 2 MG/0.05ML SOLN Inject 1 Dose into the eye every 8 (eight) weeks.     Marland Kitchen allopurinol (ZYLOPRIM) 300 MG tablet TAKE 1 TABLET EVERY DAY (Patient taking differently: Take 300 mg by mouth daily after supper. ) 90 tablet 1  . benazepril (LOTENSIN) 20 MG tablet TAKE 1 TABLET EVERY DAY (Patient taking differently: Take 20 mg by mouth daily. ) 90 tablet 1  . cetirizine (ZYRTEC) 10 MG tablet Take 10 mg by mouth daily as needed for allergies.    . Glucosamine-Chondroitin (OSTEO BI-FLEX REGULAR STRENGTH PO) Take 1 tablet by mouth 2 (two)  times daily.    Marland Kitchen levothyroxine (SYNTHROID, LEVOTHROID) 125 MCG tablet Take 1 tablet (125 mcg total) by mouth daily. 30 tablet 0  . lidocaine-prilocaine (EMLA) cream Apply to affected area once 30 g 3  . Multiple Vitamin (MULTIVITAMIN) tablet Take 1 tablet by mouth daily.    . Multiple Vitamins-Minerals (PRESERVISION AREDS 2 PO) Take 1 tablet by mouth 2 (two) times daily.    Vladimir Faster Glycol-Propyl Glycol (SYSTANE OP) Place 1 drop into both eyes daily as needed (irritation).    . sodium chloride (OCEAN) 0.65 % SOLN nasal spray Place 1 spray into both nostrils as needed for congestion.    . fluticasone (FLONASE) 50 MCG/ACT nasal spray Place 2 sprays into both nostrils daily. (Patient not taking: Reported on 09/20/2018) 16 g 0   No current facility-administered medications for this visit.     REVIEW OF SYSTEMS:    A 10+ POINT REVIEW OF SYSTEMS WAS OBTAINED including neurology, dermatology, psychiatry, cardiac, respiratory, lymph, extremities, GI, GU, Musculoskeletal, constitutional, breasts, reproductive, HEENT.  All pertinent positives are noted in the HPI.  All others are negative.   PHYSICAL EXAMINATION: ECOG PERFORMANCE STATUS: 2 - Symptomatic, <50% confined to bed  Vitals:   09/30/18 0858  BP: 114/63  Pulse: 80  Resp: 18  Temp: 98.1 F (36.7 C)  SpO2: 100%   Filed Weights   09/30/18 0858  Weight: 167 lb 4.8 oz (75.9 kg)   .Body mass index is 28.72 kg/m.  GENERAL:alert, in no acute distress and comfortable SKIN: no acute rashes, no significant lesions EYES: conjunctiva are pink and non-injected, sclera anicteric OROPHARYNX: MMM, no exudates, no oropharyngeal erythema or ulceration NECK: supple, no JVD LYMPH:  no palpable lymphadenopathy in the cervical, axillary or inguinal regions LUNGS: clear to auscultation b/l with normal respiratory effort HEART: regular rate & rhythm ABDOMEN:  normoactive bowel sounds , non tender, not distended. No palpable hepatosplenomegaly.    Extremity: no pedal edema PSYCH: alert & oriented x 3 with fluent speech NEURO: no focal motor/sensory deficits   LABORATORY DATA:  I have reviewed the data as listed  . CBC Latest Ref Rng & Units 09/30/2018 09/17/2018 09/08/2018  WBC 4.0 - 10.5 K/uL 7.3 9.2 6.7  Hemoglobin 13.0 - 17.0 g/dL 10.1(L) 10.0(L) 9.9(L)  Hematocrit 39.0 - 52.0 % 30.3(L) 30.2(L) 30.7(L)  Platelets 150 - 400 K/uL 207 301 283   . CBC    Component Value Date/Time   WBC 7.3 09/30/2018 0834   RBC 3.23 (L) 09/30/2018 0834   HGB 10.1 (L) 09/30/2018 0834   HGB 10.4 (L) 08/02/2018 0828   HCT 30.3 (L) 09/30/2018 0834   PLT 207 09/30/2018 0834   PLT 260 08/02/2018 0828   MCV 93.8 09/30/2018 0834   MCH 31.3 09/30/2018 0834   MCHC 33.3 09/30/2018 0834   RDW 13.7 09/30/2018 0834   LYMPHSABS 1.0 09/30/2018 0834   MONOABS 0.6 09/30/2018 0834   EOSABS 0.5 09/30/2018 0834   BASOSABS 0.1 09/30/2018 0834    CMP Latest Ref Rng & Units 09/30/2018 09/17/2018 09/08/2018  Glucose 70 - 99 mg/dL 104(H) 110(H) 111(H)  BUN 8 - 23 mg/dL 15 16 13   Creatinine 0.61 - 1.24 mg/dL 0.78 0.65 0.75  Sodium 135 - 145 mmol/L 139 137 139  Potassium 3.5 - 5.1 mmol/L 4.4 3.7 4.1  Chloride 98 - 111 mmol/L 105 104 107  CO2 22 - 32 mmol/L 26 25 25   Calcium 8.9 - 10.3 mg/dL 9.1 8.8(L) 8.8(L)  Total Protein 6.5 - 8.1 g/dL 6.6 6.5 6.7  Total Bilirubin 0.3 - 1.2 mg/dL 0.3 0.6 0.5  Alkaline Phos 38 - 126 U/L 80 75 83  AST 15 - 41 U/L 16 16 17   ALT 0 - 44 U/L 16 13 14    . Lab Results  Component Value Date   LDH 153 09/30/2018   Component     Latest Ref Rng & Units 03/24/2018  LDH     98 - 192 U/L 185  HCV Ab     0.0 - 0.9 s/co ratio 0.2  Hep B Core Ab, Tot     Negative Negative  Hepatitis B Surface Ag     Negative Negative  HIV Screen 4th Generation wRfx     Non Reactive Non Reactive    03/11/18 Perineum Bx:    RADIOGRAPHIC STUDIES: I have personally reviewed the radiological images as listed and agreed with the findings in the  report. Dg Chest 2 View  Result Date: 09/17/2018 CLINICAL DATA:  Fever, cough. EXAM: CHEST - 2 VIEW COMPARISON:  Radiographs of September 18, 2014. FINDINGS: The heart size and mediastinal contours are within normal limits. Both lungs are clear. Right internal jugular Port-A-Cath is noted with tip in expected position of cavoatrial junction. The visualized skeletal structures are unremarkable. IMPRESSION: No active cardiopulmonary disease. Electronically Signed   By: Marijo Conception, M.D.   On: 09/17/2018 07:30   Nm Pet Image Restag (ps) Skull Base To Thigh  Result Date: 09/22/2018 CLINICAL DATA:  Subsequent treatment strategy for diffuse large B-cell lymphoma. Evaluate response to therapy. EXAM: NUCLEAR MEDICINE PET SKULL BASE TO THIGH TECHNIQUE: 8.3 mCi F-18 FDG was injected intravenously. Full-ring PET imaging was performed from the skull base to thigh after the radiotracer. CT data was obtained and used for attenuation correction and anatomic localization. Fasting blood glucose: 111  mg/dl COMPARISON:  07/11/2018 FINDINGS: Mediastinal blood pool activity: SUV max 2.4 NECK: No areas of abnormal hypermetabolism. Incidental CT findings: Cerebral and cerebellar atrophy which are likely within normal variation for age. Fluid in the right maxillary sinus with mucosal thickening. No cervical adenopathy. Bilateral carotid atherosclerosis. CHEST: No pulmonary parenchymal or thoracic nodal hypermetabolism. Incidental CT findings: Thyroidectomy right Port-A-Cath tip in right atrium. Aortic and coronary artery atherosclerosis. Tiny hiatal hernia. Mild centrilobular emphysema. A nodule along the right major fissure is similar at 4 mm on image 22/8 and below PET resolution. Mild left lower lobe nodularity is felt to be similar. ABDOMEN/PELVIS: No abdominopelvic parenchymal or nodal hypermetabolism. Incidental CT findings: Normal adrenal glands. Abdominal aortic atherosclerosis. 5 mm lower pole left renal collecting system  calculus. Degraded evaluation of the pelvis, secondary to beam hardening artifact from left hip arthroplasty. No abdominal and no gross pelvic adenopathy. SKELETON: No abnormal marrow activity. Incidental CT findings: Left hip arthroplasty. Degenerative partial fusion of the bilateral sacroiliac joints. Mild right hip osteoarthritis. Subtle lucent lesion within the right lateral fifth rib and within both iliac wings are similar and nonspecific. IMPRESSION: 1. No evidence of hypermetabolic active lymphoma. 2. Aortic atherosclerosis (ICD10-I70.0), coronary artery atherosclerosis and emphysema (ICD10-J43.9). 3. Left nephrolithiasis. 4. Sinus disease. Electronically Signed   By: Abigail Miyamoto M.D.   On: 09/22/2018 09:03    ASSESSMENT & PLAN:   85 y.o. male with  1. Diffuse Large B-Cell Lymphoma -newly diagnosed presenting as a mass in the left medial gluteal region/perineum s/p resection. -03/11/18 Surgical bx which revealed Diffuse Large B-Cell Non-Hodgkin's Lymphoma  -04/13/18 ECHO revealed LV EF of 60-65%  -No Hep B, Hep C or HIV from 03/24/18 labs -04/18/18 PET/CT which revealed Mildly hypermetabolic small to borderline enlarged abdominal and pelvic retroperitoneal lymph nodes. Probable mild hypermetabolism within subcentimeter axillary lymph nodes, with misregistration on fused images. 2. Scattered lucent lesions in the iliac wings and right fifth rib. Continued attention on follow-up exams is warranted. 3. Scattered small pulmonary nodules are nonspecific and too small for PET resolution. Continued attention on follow-up exams is warranted. 4. Intermediate density lesion deep to the left gluteal fold, with associated hypermetabolism, which may represent a complex cyst or abscess. 5. Aortic atherosclerosis. Coronary artery calcification. 6. Left renal stone. -Treatment plan include R-CHOP for 6 cycles starting 05/10/18   07/11/18 PET/CT revealed No evidence of recurrent lymphoma. 2. Left renal stone. 3. Aortic  atherosclerosis. Coronary artery calcification.   S/p 6 cycles of R-CHOP completed on 08/24/18  PLAN:  -Discussed pt labwork today, 09/30/18; blood counts and chemistries are stable. LDH is wnl -Discussed the 09/22/18 PET/CT which revealed No evidence of hypermetabolic active lymphoma. 2. Aortic atherosclerosis, coronary artery atherosclerosis and emphysema. 3. Left nephrolithiasis. 4. Sinus disease. -Will refer the pt to IR for port removal -Will see the pt every 3 months for the first two years for lab and clinical watchful observation -Continue avoiding raw foods for up to 6 months after completing treatment and minimize exposure to food-borne infections -Recommend waiting 6 months after completion of chemotherapy for routine dental cleaning, and after Port is removed -Pt notes he is doing well living alone. He notes has a DNR in place at home. He has good support from his niece whom he speaks with daily -Recommended that the pt continue to eat well, drink at least 48-64 oz of water each day, and walk 20-30 minutes each day. -Will follow up with pt at our next visit regarding Prevnar  and Pneumovax vaccine status  -Will see the pt back in 3 months  2.  Patient Active Problem List   Diagnosis Date Noted  . Diffuse large B-cell lymphoma ([place]) 03/30/2018  . Perineal mass, male 03/11/2018  . Perineal mass in male 03/08/2018  . Degeneration of lumbar intervertebral disc 10/05/2017  . History of total hip arthroplasty 10/05/2017  . Combined forms of age-related cataract of both eyes 04/12/2014  . OA (osteoarthritis) of hip 05/31/2013  . History of revision of total replacement of left hip joint 05/31/2013  . High risk medication use 10/25/2012  . Vitreomacular adhesion 05/24/2012  . Posterior vitreous detachment 04/12/2012  . Cystoid macular edema 11/10/2011  . Subretinal neovascularization of macula 09/11/2011  . Retinal pigment epithelial detachment, bilateral 09/11/2011  . Macular  degeneration of both eyes 06/09/2011  . History of thyroid cancer 04/21/2011  . Macular degeneration 01/05/2011  . CARCINOMA, THYROID GLAND, PAPILLARY 11/20/2008  . HYPOTHYROIDISM, POSTSURGICAL 11/20/2008  . Osteoarthritis 07/01/2007  . LOW BACK PAIN 07/01/2007  . Uric acid renal calculus 06/20/2007  . Essential hypertension 06/20/2007   -continue f/u with PCP   Schedule to have port removed in 1-2 weeks RTC with Dr Irene Limbo with labs in 3 months     All of the patients questions were answered with apparent satisfaction. The patient knows to call the clinic with any problems, questions or concerns.  The total time spent in the appt was 25 minutes and more than 50% was on counseling and direct patient cares.   Sullivan Lone MD MS AAHIVMS [facility] Hematology/Oncology Physician [facility]  (Office):       [phone] (Work cell):  [phone] (Fax):           [phone]  09/30/2018 10:19 AM  I, Baldwin Jamaica, am acting as a scribe for Dr. Sullivan Lone.   .I have reviewed the above documentation for accuracy and completeness, and I agree with the above. Brunetta Genera MD

## 2018-09-30 ENCOUNTER — Encounter: Payer: Self-pay | Admitting: Hematology

## 2018-09-30 ENCOUNTER — Inpatient Hospital Stay: Payer: Medicare PPO | Attending: Hematology | Admitting: Hematology

## 2018-09-30 ENCOUNTER — Telehealth: Payer: Self-pay | Admitting: Hematology

## 2018-09-30 ENCOUNTER — Inpatient Hospital Stay: Payer: Medicare PPO

## 2018-09-30 ENCOUNTER — Telehealth: Payer: Self-pay | Admitting: *Deleted

## 2018-09-30 VITALS — BP 114/63 | HR 80 | Temp 98.1°F | Resp 18 | Ht 64.0 in | Wt 167.3 lb

## 2018-09-30 DIAGNOSIS — M1990 Unspecified osteoarthritis, unspecified site: Secondary | ICD-10-CM | POA: Insufficient documentation

## 2018-09-30 DIAGNOSIS — Z79899 Other long term (current) drug therapy: Secondary | ICD-10-CM

## 2018-09-30 DIAGNOSIS — C8330 Diffuse large B-cell lymphoma, unspecified site: Secondary | ICD-10-CM

## 2018-09-30 DIAGNOSIS — Z87442 Personal history of urinary calculi: Secondary | ICD-10-CM | POA: Insufficient documentation

## 2018-09-30 DIAGNOSIS — J439 Emphysema, unspecified: Secondary | ICD-10-CM | POA: Diagnosis not present

## 2018-09-30 DIAGNOSIS — I10 Essential (primary) hypertension: Secondary | ICD-10-CM | POA: Insufficient documentation

## 2018-09-30 DIAGNOSIS — Z87891 Personal history of nicotine dependence: Secondary | ICD-10-CM | POA: Diagnosis not present

## 2018-09-30 DIAGNOSIS — E890 Postprocedural hypothyroidism: Secondary | ICD-10-CM | POA: Insufficient documentation

## 2018-09-30 DIAGNOSIS — I700 Atherosclerosis of aorta: Secondary | ICD-10-CM | POA: Diagnosis not present

## 2018-09-30 DIAGNOSIS — Z5111 Encounter for antineoplastic chemotherapy: Secondary | ICD-10-CM

## 2018-09-30 DIAGNOSIS — Z85850 Personal history of malignant neoplasm of thyroid: Secondary | ICD-10-CM | POA: Diagnosis not present

## 2018-09-30 DIAGNOSIS — I2510 Atherosclerotic heart disease of native coronary artery without angina pectoris: Secondary | ICD-10-CM | POA: Insufficient documentation

## 2018-09-30 LAB — CMP (CANCER CENTER ONLY)
ALT: 16 U/L (ref 0–44)
AST: 16 U/L (ref 15–41)
Albumin: 3.3 g/dL — ABNORMAL LOW (ref 3.5–5.0)
Alkaline Phosphatase: 80 U/L (ref 38–126)
Anion gap: 8 (ref 5–15)
BUN: 15 mg/dL (ref 8–23)
CO2: 26 mmol/L (ref 22–32)
Calcium: 9.1 mg/dL (ref 8.9–10.3)
Chloride: 105 mmol/L (ref 98–111)
Creatinine: 0.78 mg/dL (ref 0.61–1.24)
GFR, Est AFR Am: >60 mL/min (ref >60)
GFR, Estimated: >60 mL/min (ref >60)
Glucose, Bld: 104 mg/dL — ABNORMAL HIGH (ref 70–99)
POTASSIUM: 4.4 mmol/L (ref 3.5–5.1)
Sodium: 139 mmol/L (ref 135–145)
Total Bilirubin: 0.3 mg/dL (ref 0.3–1.2)
Total Protein: 6.6 g/dL (ref 6.5–8.1)

## 2018-09-30 LAB — CBC WITH DIFFERENTIAL/PLATELET
ABS IMMATURE GRANULOCYTES: 0.03 10*3/uL (ref 0.00–0.07)
Basophils Absolute: 0.1 10*3/uL (ref 0.0–0.1)
Basophils Relative: 1 %
Eosinophils Absolute: 0.5 10*3/uL (ref 0.0–0.5)
Eosinophils Relative: 6 %
HCT: 30.3 % — ABNORMAL LOW (ref 39.0–52.0)
Hemoglobin: 10.1 g/dL — ABNORMAL LOW (ref 13.0–17.0)
Immature Granulocytes: 0 %
Lymphocytes Relative: 13 %
Lymphs Abs: 1 10*3/uL (ref 0.7–4.0)
MCH: 31.3 pg (ref 26.0–34.0)
MCHC: 33.3 g/dL (ref 30.0–36.0)
MCV: 93.8 fL (ref 80.0–100.0)
Monocytes Absolute: 0.6 10*3/uL (ref 0.1–1.0)
Monocytes Relative: 8 %
NEUTROS ABS: 5.2 10*3/uL (ref 1.7–7.7)
Neutrophils Relative %: 72 %
Platelets: 207 10*3/uL (ref 150–400)
RBC: 3.23 MIL/uL — AB (ref 4.22–5.81)
RDW: 13.7 % (ref 11.5–15.5)
WBC: 7.3 10*3/uL (ref 4.0–10.5)
nRBC: 0 % (ref 0.0–0.2)

## 2018-09-30 LAB — LACTATE DEHYDROGENASE: LDH: 153 U/L (ref 98–192)

## 2018-09-30 NOTE — Telephone Encounter (Signed)
Schedules appt per 01/24 los.  Printed avs.

## 2018-09-30 NOTE — Telephone Encounter (Signed)
Left voice mail: Forgot to ask MD at today's appt - can he return to dentist for regular cleanings? Routing message to Dr. Irene Limbo

## 2018-09-30 NOTE — Patient Instructions (Signed)
Thank you for choosing [facility] to provide your oncology and hematology care.  To afford each patient quality time with our providers, please arrive 30 minutes before your scheduled appointment time.  If you arrive late for your appointment, you may be asked to reschedule.  We strive to give you quality time with our providers, and arriving late affects you and other patients whose appointments are after yours.    If you are a no show for multiple scheduled visits, you may be dismissed from the clinic at the providers discretion.     Again, thank you for choosing [facility], our hope is that these requests will decrease the amount of time that you wait before being seen by our physicians.  ______________________________________________________________________   Should you have questions after your visit to the [facility], please contact our office at [phone] between the hours of 8:30 and 4:30 p.m.    Voicemails left after 4:30p.m will not be returned until the following business day.     For prescription refill requests, please have your pharmacy contact us directly.  Please also try to allow 48 hours for prescription requests.     Please contact the scheduling department for questions regarding scheduling.  For scheduling of procedures such as PET scans, CT scans, MRI, Ultrasound, etc please contact central scheduling at [phone].     Resources For Cancer Patients and Caregivers:    Oncolink.org:  A wonderful resource for patients and healthcare providers for information regarding your disease, ways to tract your treatment, what to expect, etc.      American Cancer Society:  800-227-2345  Can help patients locate various types of support and financial assistance   Cancer Care: 1-800-813-HOPE (4673) Provides financial assistance, online support groups, medication/co-pay assistance.     [place] DSS:  336-641-3447 Where to apply  for food stamps, Medicaid, and utility assistance   Medicare Rights Center: 800-333-4114 Helps people with Medicare understand their rights and benefits, navigate the Medicare system, and secure the quality healthcare they deserve   SCAT: 336-333-6589 [place] [employer]'s shared-ride transportation service for eligible riders who have a disability that prevents them from riding the fixed route bus.     For additional information on assistance programs please contact our social worker:   Abigail Elmore:  [phone]  

## 2018-10-06 ENCOUNTER — Encounter: Payer: Self-pay | Admitting: Podiatry

## 2018-10-06 ENCOUNTER — Ambulatory Visit: Payer: Medicare PPO | Admitting: Podiatry

## 2018-10-06 VITALS — BP 110/63

## 2018-10-06 DIAGNOSIS — M79676 Pain in unspecified toe(s): Secondary | ICD-10-CM | POA: Diagnosis not present

## 2018-10-06 DIAGNOSIS — L601 Onycholysis: Secondary | ICD-10-CM | POA: Diagnosis not present

## 2018-10-06 DIAGNOSIS — L600 Ingrowing nail: Secondary | ICD-10-CM | POA: Diagnosis not present

## 2018-10-06 NOTE — Patient Instructions (Signed)

## 2018-10-06 NOTE — Progress Notes (Signed)
Subjective:  Patient ID: Jay Webster, male    DOB: 09-01-1932,  MRN: [id number]  Chief Complaint  Patient presents with  . Foot Problem    toenail infection, lateral side is sticking out    85 y.o. male presents with the above complaint.  Reports wheezes and ingrown nail on the outside of the right great toe started hurting 2 to 3 weeks ago causing him some pain and redness to the toe.  Review of Systems: Negative except as noted in the HPI. Denies N/V/F/Ch.  Past Medical History:  Diagnosis Date  . Arthritis    OA AND PAIN LEFT HIP AND OTHER JOINT PAINS  . Cancer (HCC)    THYROID CANCER - HAD THYROID REMOVED  . History of kidney stones   . History of shingles    RESOLVED  . Hypertension   . Hypothyroidism   . Low back pain   . Macular degeneration of both eyes   . RBBB (right bundle branch block with left anterior fascicular block)   . Seasonal allergies   . Thyroid disease   . Uric acid renal calculus 06/20/2007   Qualifier: Diagnosis of  By: Rogue Bussing CMA, Maryann Alar      Current Outpatient Medications:  .  acetaminophen (TYLENOL) 325 MG tablet, Take 325-650 mg by mouth every 6 (six) hours as needed for headache. , Disp: , Rfl:  .  Aflibercept (EYLEA) 2 MG/0.05ML SOLN, Inject 1 Dose into the eye every 8 (eight) weeks. , Disp: , Rfl:  .  allopurinol (ZYLOPRIM) 300 MG tablet, TAKE 1 TABLET EVERY DAY (Patient taking differently: Take 300 mg by mouth daily after supper. ), Disp: 90 tablet, Rfl: 1 .  benazepril (LOTENSIN) 20 MG tablet, TAKE 1 TABLET EVERY DAY (Patient taking differently: Take 20 mg by mouth daily. ), Disp: 90 tablet, Rfl: 1 .  cetirizine (ZYRTEC) 10 MG tablet, Take 10 mg by mouth daily as needed for allergies., Disp: , Rfl:  .  fluticasone (FLONASE) 50 MCG/ACT nasal spray, Place 2 sprays into both nostrils daily., Disp: 16 g, Rfl: 0 .  Glucosamine-Chondroitin (OSTEO BI-FLEX REGULAR STRENGTH PO), Take 1 tablet by mouth 2 (two) times daily., Disp: , Rfl:  .   levothyroxine (SYNTHROID, LEVOTHROID) 125 MCG tablet, Take 1 tablet (125 mcg total) by mouth daily., Disp: 30 tablet, Rfl: 0 .  lidocaine-prilocaine (EMLA) cream, Apply to affected area once, Disp: 30 g, Rfl: 3 .  Multiple Vitamin (MULTIVITAMIN) tablet, Take 1 tablet by mouth daily., Disp: , Rfl:  .  Multiple Vitamins-Minerals (PRESERVISION AREDS 2 PO), Take 1 tablet by mouth 2 (two) times daily., Disp: , Rfl:  .  Polyethyl Glycol-Propyl Glycol (SYSTANE OP), Place 1 drop into both eyes daily as needed (irritation)., Disp: , Rfl:  .  sodium chloride (OCEAN) 0.65 % SOLN nasal spray, Place 1 spray into both nostrils as needed for congestion., Disp: , Rfl:   Social History   Tobacco Use  Smoking Status Former Smoker  . Types: Cigarettes  . Last attempt to quit: 02/22/1979  . Years since quitting: 39.6  Smokeless Tobacco Never Used    Allergies  Allergen Reactions  . Penicillins Swelling, Palpitations and Other (See Comments)    Swelling of joints. Has patient had a PCN reaction causing immediate rash, facial/tongue/throat swelling, SOB or lightheadedness with hypotension: Yes Has patient had a PCN reaction causing severe rash involving mucus membranes or skin necrosis: No Has patient had a PCN reaction that required hospitalization: Yes Has patient  had a PCN reaction occurring within the last 10 years: No If all of the above answers are "NO", then may proceed with Cephalosporin use.    Objective:   Vitals:   10/06/18 0838  BP: 110/63   There is no height or weight on file to calculate BMI. Constitutional Well developed. Well nourished.  Vascular Dorsalis pedis pulses palpable bilaterally. Posterior tibial pulses palpable bilaterally. Capillary refill normal to all digits.  No cyanosis or clubbing noted. Pedal hair growth normal.  Neurologic Normal speech. Oriented to person, place, and time. Epicritic sensation to light touch grossly present bilaterally.  Dermatologic Nail  right hallux nail with lateral border ingrowing nail with erythema pain on palpation, onycholysis of the toenail No open wounds. No skin lesions.  Orthopedic: Normal joint ROM without pain or crepitus bilaterally. No visible deformities. No bony tenderness.   Radiographs: None Assessment:   1. Onycholysis   2. Ingrown nail   3. Pain around toenail    Plan:  Patient was evaluated and treated and all questions answered.  Onycholysis, right -Patient elects to proceed with ingrown toenail removal today -Nail avulsed. See procedure note. -Educated on post-procedure care including soaking. Written instructions provided. -Patient to follow up in 2 weeks for nail check.  Procedure: Avulsion of toenail Location: Right 1st toe  Anesthesia: Lidocaine 1% plain; 1.5 mL and Marcaine 0.5% plain; 1.5 mL, digital block. Skin Prep: Betadine. Dressing: Silvadene; telfa; dry, sterile, compression dressing. Technique: Following skin prep, the toe was exsanguinated and a tourniquet was secured at the base of the toe. The nail was freed and avulsed with a hemostat. The area was cleansed. The tourniquet was then removed and sterile dressing applied. Disposition: Patient tolerated procedure well.    Return in about 2 weeks (around 10/20/2018) for 2 weeks nail check, Nail Check with Nurse.

## 2018-10-08 IMAGING — US IR FLUORO GUIDE CV LINE*L*
1 series · 3 of 3 positions shown · non-contrast
Comparison: PET-CT - 04/18/2018

INDICATION: History of thyroid cancer with new diagnosis of diffuse large B-cell
lymphoma. In need of durable intravenous access for chemotherapy
administration.

EXAM:
IMPLANTED PORT A CATH PLACEMENT WITH ULTRASOUND AND FLUOROSCOPIC
GUIDANCE

[Series 1: ir fluoro guide cv line*left* · 3 of 3 slices shown]
[im 1/3]
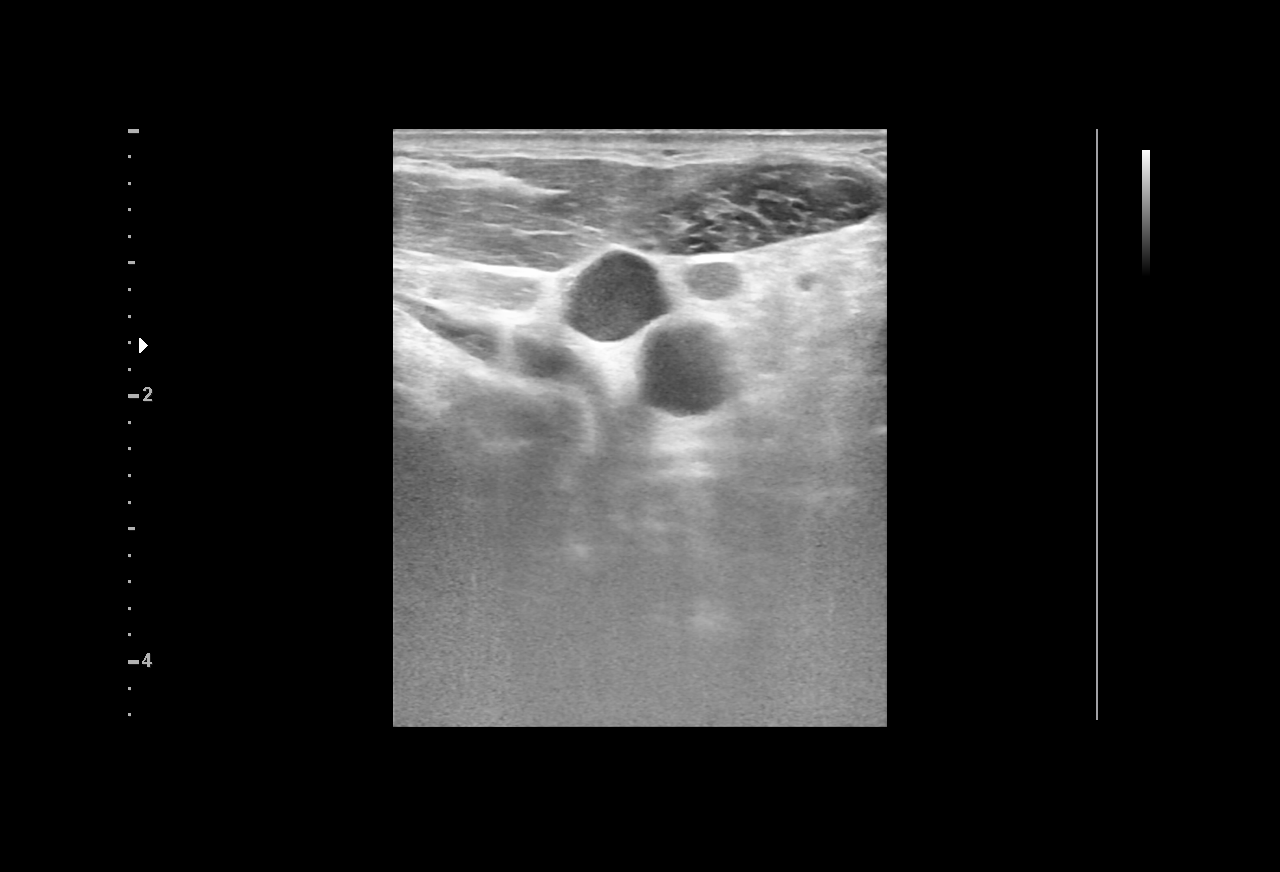
[im 2/3]
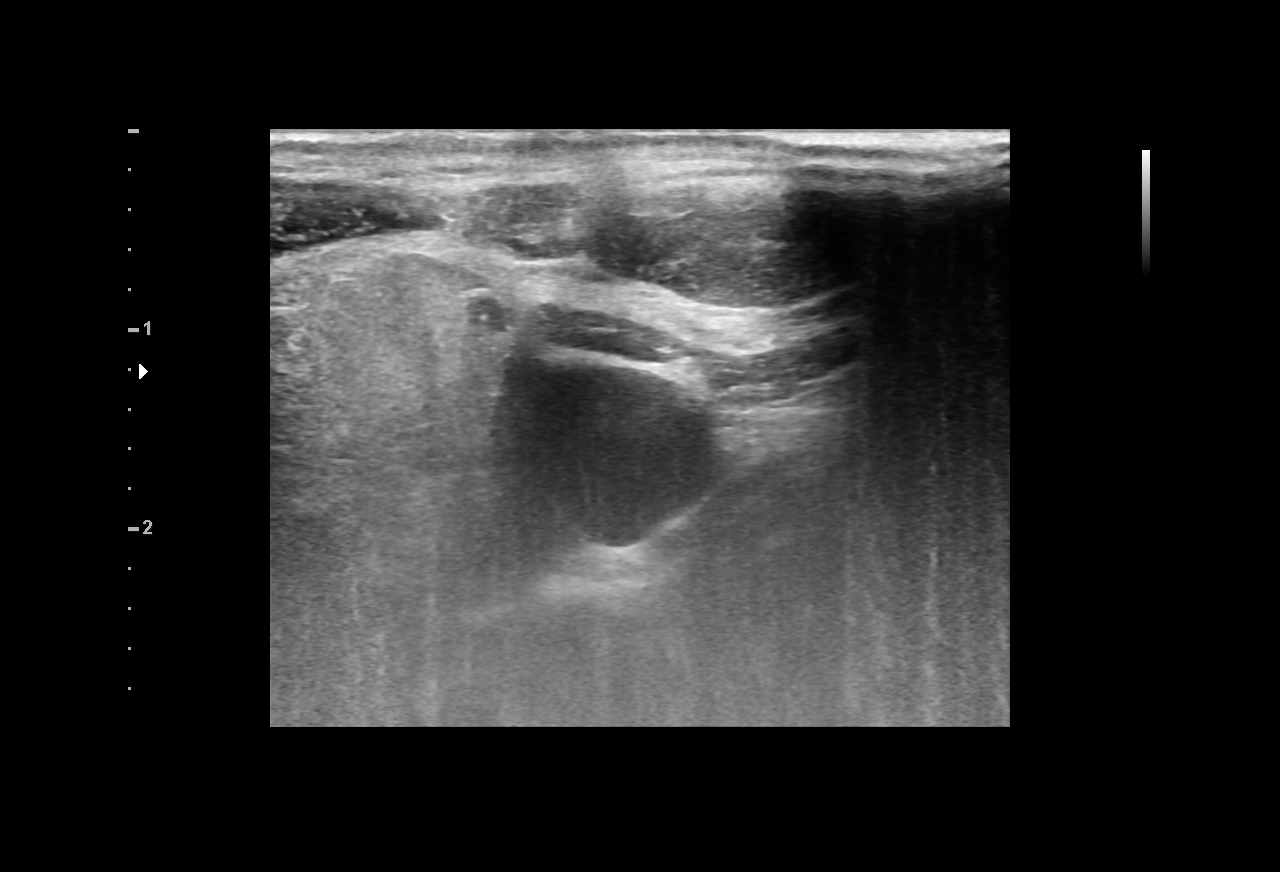
[im 3/3]
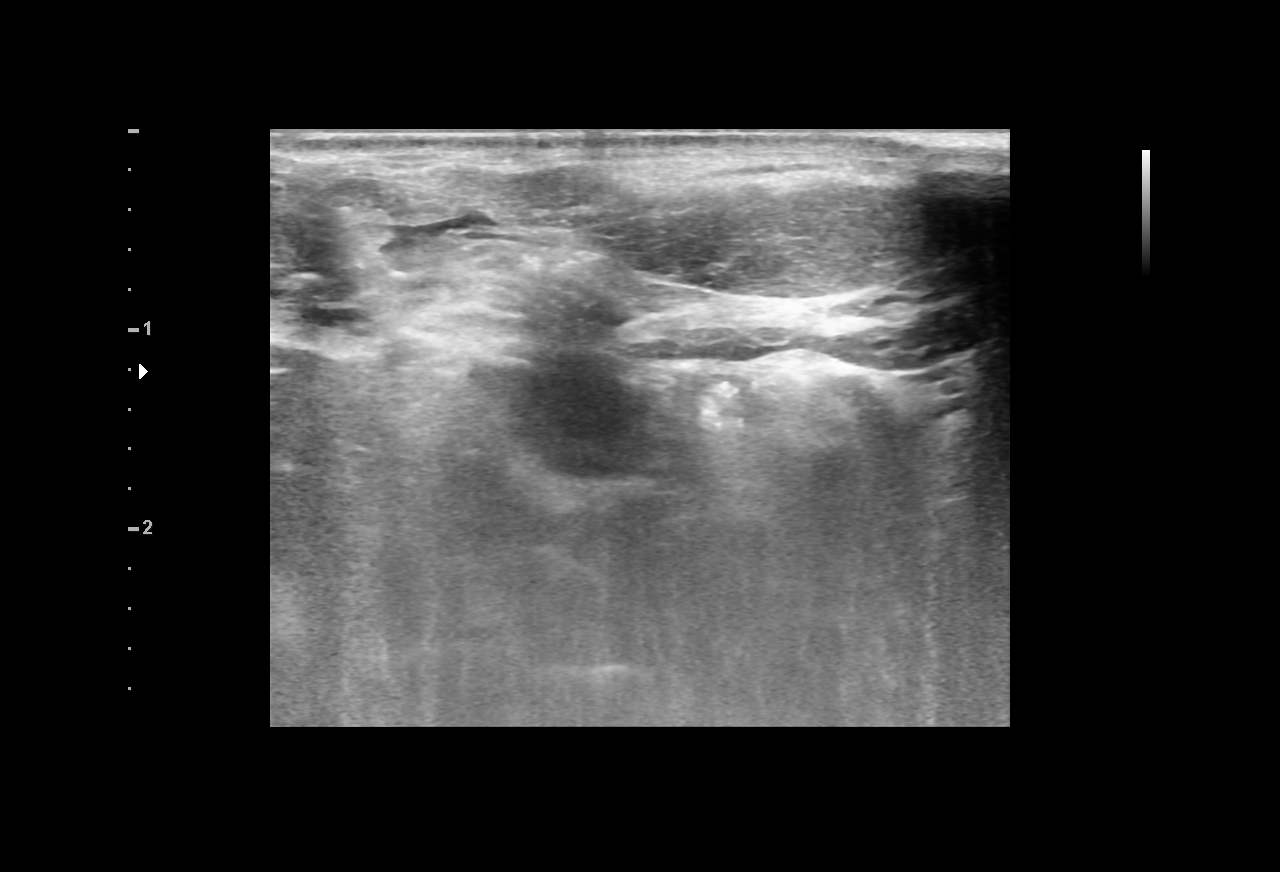

[3 of 3 positions shown; findings below may reference images not displayed]

MEDICATIONS:
Vancomycin 1 gm IV; The antibiotic was administered within an
appropriate time interval prior to skin puncture.

ANESTHESIA/SEDATION:
Moderate (conscious) sedation was employed during this procedure. A
total of Versed 2 mg and Fentanyl 100 mcg was administered
intravenously.

Moderate Sedation Time: minutes. The patient's level of
consciousness and vital signs were monitored continuously by
radiology nursing throughout the procedure under my direct
supervision.

CONTRAST:  None

FLUOROSCOPY TIME:  2 minutes, 2 seconds (10.6 mGy)

COMPLICATIONS:
None immediate.

PROCEDURE:
The procedure, risks, benefits, and alternatives were explained to
the patient. Questions regarding the procedure were encouraged and
answered. The patient understands and consents to the procedure.

The right neck and chest were prepped with chlorhexidine in a
sterile fashion, and a sterile drape was applied covering the
operative field. Maximum barrier sterile technique with sterile
gowns and gloves were used for the procedure. A timeout was
performed prior to the initiation of the procedure. Local anesthesia
was provided with 1% lidocaine with epinephrine.

After creating a small venotomy incision, a micropuncture kit was
utilized to access the internal jugular vein. Real-time ultrasound
guidance was utilized for vascular access including the acquisition
of a permanent ultrasound image documenting patency of the accessed
vessel. The microwire was utilized to measure appropriate catheter
length.

A subcutaneous port pocket was then created along the upper chest
wall utilizing a combination of sharp and blunt dissection. The
pocket was irrigated with sterile saline. A single lumen thin power
injectable port was chosen for placement. The 8 Fr catheter was
tunneled from the port pocket site to the venotomy incision. The
port was placed in the pocket. The external catheter was trimmed to
appropriate length. At the venotomy, an 8 Fr peel-away sheath was
placed over a guidewire under fluoroscopic guidance. The catheter
was then placed through the sheath and the sheath was removed. Final
catheter positioning was confirmed and documented with a
fluoroscopic spot radiograph. The port was accessed with Sigat Aliana
needle, aspirated and flushed with heparinized saline.

The venotomy site was closed with an interrupted 4-0 Vicryl suture.
The port pocket incision was closed with interrupted 2-0 Vicryl
suture and the skin was opposed with a running subcuticular 4-0
Vicryl suture. Dermabond and Shahid were applied to both
incisions. Dressings were placed. The patient tolerated the
procedure well without immediate post procedural complication.
FINDINGS: After catheter placement, the tip lies within the superior
cavoatrial junction. The catheter aspirates and flushes normally and
is ready for immediate use.
IMPRESSION: Successful placement of a right internal jugular approach power
injectable Port-A-Cath. The catheter is ready for immediate use.

## 2018-10-11 DIAGNOSIS — C8515 Unspecified B-cell lymphoma, lymph nodes of inguinal region and lower limb: Secondary | ICD-10-CM | POA: Diagnosis not present

## 2018-10-12 ENCOUNTER — Encounter: Payer: Self-pay | Admitting: Family Medicine

## 2018-10-12 ENCOUNTER — Other Ambulatory Visit: Payer: Self-pay

## 2018-10-12 ENCOUNTER — Ambulatory Visit: Payer: Medicare PPO | Admitting: Family Medicine

## 2018-10-12 VITALS — BP 124/78 | HR 66 | Temp 97.5°F | Ht 64.0 in | Wt 167.2 lb

## 2018-10-12 DIAGNOSIS — R0982 Postnasal drip: Secondary | ICD-10-CM | POA: Diagnosis not present

## 2018-10-12 NOTE — Progress Notes (Signed)
Subjective:     Patient ID: Jay Webster, male   DOB: 1932-03-20, 85 y.o.   MRN: [id number]  HPI Patient is seen with postnasal drip symptoms.  He was seen here recent with epistaxis but those symptoms have essentially ceased.  He has history of B-cell lymphoma and states that he is in "remission ".  He has mostly postnasal drip symptoms.  No headaches.  No facial pain.  No purulent secretions.  He does have history of seasonal and perennial allergies and is currently not taking Zyrtec.  Had been taking Flonase but was advised to stop because of the alcohol content which we said could exacerbate his nosebleeds.  Past Medical History:  Diagnosis Date  . Arthritis    OA AND PAIN LEFT HIP AND OTHER JOINT PAINS  . Cancer (HCC)    THYROID CANCER - HAD THYROID REMOVED  . History of kidney stones   . History of shingles    RESOLVED  . Hypertension   . Hypothyroidism   . Low back pain   . Macular degeneration of both eyes   . RBBB (right bundle branch block with left anterior fascicular block)   . Seasonal allergies   . Thyroid disease   . Uric acid renal calculus 06/20/2007   Qualifier: Diagnosis of  By: Rogue Bussing CMA, Maryann Alar     Past Surgical History:  Procedure Laterality Date  . APPENDECTOMY  1948  . COLONOSCOPY    . Elbow Radical Reduction  1973  . IR IMAGING GUIDED PORT INSERTION  05/05/2018  . [place]  . MASS EXCISION N/A 03/11/2018   Procedure: EXCISION MASS OF PERINEUM;  Surgeon: Armandina Gemma, MD;  Location: [facility];  Service: General;  Laterality: N/A;  . REMOVAL OF FIRST RIB   NOV 1994   FOR COMPRESSED VEIN BETWEEN 1 ST RIB AND COLLARBONE  . Rib removed, 1st  1994  . ROTATOR CUFF REPAIR  2009  . THYROIDECTOMY  2010  . TOTAL HIP ARTHROPLASTY Left 05/31/2013   Procedure: LEFT TOTAL HIP ARTHROPLASTY ANTERIOR APPROACH;  Surgeon: Gearlean Alf, MD;  Location: [facility];  Service: Orthopedics;  Laterality: Left;    reports that he quit smoking about  39 years ago. His smoking use included cigarettes. He has never used smokeless tobacco. He reports current alcohol use. He reports that he does not use drugs. family history includes Anemia in an other family member; Diabetes in his mother; Heart disease in his mother; Thyroid disease in an other family member. Allergies  Allergen Reactions  . Penicillins Swelling, Palpitations and Other (See Comments)    Swelling of joints. Has patient had a PCN reaction causing immediate rash, facial/tongue/throat swelling, SOB or lightheadedness with hypotension: Yes Has patient had a PCN reaction causing severe rash involving mucus membranes or skin necrosis: No Has patient had a PCN reaction that required hospitalization: Yes Has patient had a PCN reaction occurring within the last 10 years: No If all of the above answers are "NO", then may proceed with Cephalosporin use.      Review of Systems  Constitutional: Negative for chills and fever.  HENT: Positive for congestion and postnasal drip. Negative for sinus pressure, sinus pain and sore throat.   Respiratory: Negative for cough.        Objective:   Physical Exam Constitutional:      Appearance: Normal appearance.  HENT:     Right Ear: Tympanic membrane normal.     Left  Ear: Tympanic membrane normal.     Nose:     Comments: Slightly dry appearing nasal mucosa.  He has just a couple of very small punctate areas of dried blood right naris.    Mouth/Throat:     Pharynx: Oropharynx is clear.  Neck:     Musculoskeletal: Neck supple.  Cardiovascular:     Rate and Rhythm: Normal rate.  Pulmonary:     Effort: Pulmonary effort is normal.     Breath sounds: Normal breath sounds.  Neurological:     Mental Status: He is alert.        Assessment:     Rhinitis and postnasal drip symptoms.  Does not have any clinical signs or symptoms to suggest likely bacterial sinusitis    Plan:     -Consider humidifier at night -saline spray-esp at  night -Get back on Zyrtec and consider Nasacort AQ if needed -Reviewed signs and symptoms of bacterial sinusitis to watch out for  Eulas Post MD [facility]

## 2018-10-12 NOTE — Patient Instructions (Signed)
Get back on the Zyrtec for the postnasal drip  Could add Nasacort AQ  Also consider nasal saline spray.    Consider humidifier at night.

## 2018-10-13 ENCOUNTER — Ambulatory Visit: Payer: Medicare PPO | Admitting: Family Medicine

## 2018-10-17 ENCOUNTER — Other Ambulatory Visit: Payer: Self-pay | Admitting: Radiology

## 2018-10-18 ENCOUNTER — Ambulatory Visit (HOSPITAL_COMMUNITY)
Admission: RE | Admit: 2018-10-18 | Discharge: 2018-10-18 | Disposition: A | Discharge status: Home / Self Care | Payer: Medicare PPO | Source: Ambulatory Visit | Attending: Hematology | Admitting: Hematology

## 2018-10-18 ENCOUNTER — Encounter (HOSPITAL_COMMUNITY): Payer: Self-pay

## 2018-10-18 DIAGNOSIS — C8330 Diffuse large B-cell lymphoma, unspecified site: Secondary | ICD-10-CM | POA: Insufficient documentation

## 2018-10-18 DIAGNOSIS — Z452 Encounter for adjustment and management of vascular access device: Secondary | ICD-10-CM | POA: Insufficient documentation

## 2018-10-18 HISTORY — PX: IR REMOVAL TUN ACCESS W/ PORT W/O FL MOD SED: IMG2290

## 2018-10-18 LAB — CBC WITH DIFFERENTIAL/PLATELET
ABS IMMATURE GRANULOCYTES: 0.01 10*3/uL (ref 0.00–0.07)
Basophils Absolute: 0 10*3/uL (ref 0.0–0.1)
Basophils Relative: 1 %
Eosinophils Absolute: 0.1 10*3/uL (ref 0.0–0.5)
Eosinophils Relative: 3 %
HCT: 33.4 % — ABNORMAL LOW (ref 39.0–52.0)
HEMOGLOBIN: 10.7 g/dL — AB (ref 13.0–17.0)
Immature Granulocytes: 0 %
Lymphocytes Relative: 18 %
Lymphs Abs: 0.8 10*3/uL (ref 0.7–4.0)
MCH: 30.9 pg (ref 26.0–34.0)
MCHC: 32 g/dL (ref 30.0–36.0)
MCV: 96.5 fL (ref 80.0–100.0)
Monocytes Absolute: 0.4 10*3/uL (ref 0.1–1.0)
Monocytes Relative: 9 %
Neutro Abs: 3 10*3/uL (ref 1.7–7.7)
Neutrophils Relative %: 69 %
Platelets: 179 10*3/uL (ref 150–400)
RBC: 3.46 MIL/uL — ABNORMAL LOW (ref 4.22–5.81)
RDW: 13.6 % (ref 11.5–15.5)
WBC: 4.5 10*3/uL (ref 4.0–10.5)
nRBC: 0 % (ref 0.0–0.2)

## 2018-10-18 LAB — PROTIME-INR
INR: 0.99
Prothrombin Time: 13 seconds (ref 11.4–15.2)

## 2018-10-18 MED ORDER — LIDOCAINE HCL 1 % IJ SOLN
INTRAMUSCULAR | Status: DC | PRN
  Administered 2018-10-18: 10 mL via INTRADERMAL

## 2018-10-18 MED ORDER — SODIUM CHLORIDE 0.9 % IV SOLN
INTRAVENOUS | Status: DC
  Administered 2018-10-18: 12:00:00 via INTRAVENOUS

## 2018-10-18 MED ORDER — CLINDAMYCIN PHOSPHATE 900 MG/50ML IV SOLN
900.0000 mg | Freq: Once | INTRAVENOUS | Status: AC
  Administered 2018-10-18: 900 mg via INTRAVENOUS

## 2018-10-18 MED ORDER — CLINDAMYCIN PHOSPHATE 900 MG/50ML IV SOLN
INTRAVENOUS | Status: AC
  Filled 2018-10-18: qty 50

## 2018-10-18 MED ORDER — LIDOCAINE HCL 1 % IJ SOLN
INTRAMUSCULAR | Status: AC
  Filled 2018-10-18: qty 20

## 2018-10-18 NOTE — Discharge Instructions (Signed)
Implanted Port Removal, Care After  This sheet gives you information about how to care for yourself after your procedure. Your health care provider may also give you more specific instructions. If you have problems or questions, contact your health care provider.  What can I expect after the procedure?  After the procedure, it is common to have:   Soreness or pain near your incision.   Some swelling or bruising near your incision.  Follow these instructions at home:  Medicines   Take over-the-counter and prescription medicines only as told by your health care provider.   If you were prescribed an antibiotic medicine, take it as told by your health care provider. Do not stop taking the antibiotic even if you start to feel better.  Bathing   Do not take baths, swim, or use a hot tub until your health care provider approves. Ask your health care provider if you can take showers. You may only be allowed to take sponge baths.  Incision care     Follow instructions from your health care provider about how to take care of your incision. Make sure you:  ? Wash your hands with soap and water before you change your bandage (dressing). If soap and water are not available, use hand sanitizer.  ? Change your dressing as told by your health care provider.  ? Keep your dressing dry.  ? Leave stitches (sutures), skin glue, or adhesive strips in place. These skin closures may need to stay in place for 2 weeks or longer. If adhesive strip edges start to loosen and curl up, you may trim the loose edges. Do not remove adhesive strips completely unless your health care provider tells you to do that.   Check your incision area every day for signs of infection. Check for:  ? More redness, swelling, or pain.  ? More fluid or blood.  ? Warmth.  ? Pus or a bad smell.  Driving     Do not drive for 24 hours if you were given a medicine to help you relax (sedative) during your procedure.   If you did not receive a sedative, ask  your health care provider when it is safe to drive.  Activity   Return to your normal activities as told by your health care provider. Ask your health care provider what activities are safe for you.   Do not lift anything that is heavier than 10 lb (4.5 kg), or the limit that you are told, until your health care provider says that it is safe.   Do not do activities that involve lifting your arms over your head.  General instructions   Do not use any products that contain nicotine or tobacco, such as cigarettes and e-cigarettes. These can delay healing. If you need help quitting, ask your health care provider.   Keep all follow-up visits as told by your health care provider. This is important.  Contact a health care provider if:   You have more redness, swelling, or pain around your incision.   You have more fluid or blood coming from your incision.   Your incision feels warm to the touch.   You have pus or a bad smell coming from your incision.   You have pain that is not relieved by your pain medicine.  Get help right away if you have:   A fever or chills.   Chest pain.   Difficulty breathing.  Summary   After the procedure, it is common to   have pain, soreness, swelling, or bruising near your incision.   If you were prescribed an antibiotic medicine, take it as told by your health care provider. Do not stop taking the antibiotic even if you start to feel better.   Do not drive for 24 hours if you were given a sedative during your procedure.   Return to your normal activities as told by your health care provider. Ask your health care provider what activities are safe for you.  This information is not intended to replace advice given to you by your health care provider. Make sure you discuss any questions you have with your health care provider.  Document Released: 08/05/2015 Document Revised: 10/07/2017 Document Reviewed: 10/07/2017  Elsevier Interactive Patient Education  2019 [employer]

## 2018-10-18 NOTE — Procedures (Signed)
Interventional Radiology Procedure Note  Procedure: Single Lumen Power Port Removal  Access:  Right IJ vein.  Findings: Right chest port removed in entirety with attached catheter.  Complications: None  EBL: < 10 mL  Recommendations:  - Ok to shower in 24 hours - Do not submerge for 7 days   Obbie Lewallen T. Kathlene Cote, M.D Pager:  [phone]

## 2018-10-18 NOTE — Progress Notes (Signed)
Patient ID: Jay Webster, male   DOB: 12-12-1931, 85 y.o.   MRN: [id number] Patient presents to radiology department today for Port-A-Cath removal.  Vital signs stable, afebrile. Labs pending. He has a history of diffuse large B-cell lymphoma and has completed treatment.  There is no active disease on recent imaging.  Details/risks of procedure, including but not limited to, internal bleeding, infection, injury to adjacent structures discussed with patient with his understanding and consent.  He requests no IV conscious sedation for procedure.

## 2018-10-20 ENCOUNTER — Ambulatory Visit: Payer: Medicare PPO | Admitting: Podiatry

## 2018-10-20 DIAGNOSIS — M79676 Pain in unspecified toe(s): Secondary | ICD-10-CM

## 2018-10-20 DIAGNOSIS — L601 Onycholysis: Secondary | ICD-10-CM

## 2018-10-20 NOTE — Progress Notes (Signed)
  Subjective:  Patient ID: Jay Webster, male    DOB: December 03, 1931,  MRN: [id number]  Chief Complaint  Patient presents with  . Nail Problem    Pt states right 1st nail is healing well post avulsion and has no pain. Pt has no new concerns at this time. Pt states he has been applying neosporin every morning.   85 y.o. male returns for the above complaint.   Objective:   General AA&O x3. Normal mood and affect.  Vascular Foot warm and well perfused with good capillary refill.  Neurologic Sensation grossly intact.  Dermatologic Nail avulsion site healing well without drainage or erythema. Nail bed with overlying soft crust. Left intact. No signs of local infection.  Orthopedic: No tenderness to palpation of the toe.   Assessment & Plan:  Patient was evaluated and treated and all questions answered.  S/p Ingrown Toenail Excision, right -Healing well without issue. -Discussed return precautions. -F/u PRN

## 2018-10-31 ENCOUNTER — Telehealth: Payer: Self-pay | Admitting: *Deleted

## 2018-10-31 NOTE — Telephone Encounter (Signed)
Patient called - left voice mail stating his dentist needs a release from Dr. Irene Limbo.  Contacted patient - Per Dr. Grier Mitts OV note 1/24, patient was recommended to wait 6 months after completion of chemotherapy/after port removed for routine dental cleaning . Last chemo 11/26. 6 months will be end of May and that's when he can make appt for cleaning.  Told him once 6 months passes, he will not require clearance from Dr. Irene Limbo.   Patient verbalized understanding.Marland Kitchen He stated it's the dentist that wanted to cover all the bases. Gave this office fax  [phone] and informed him dentist office can fax over form or note that they want signed.

## 2018-11-10 DIAGNOSIS — H353231 Exudative age-related macular degeneration, bilateral, with active choroidal neovascularization: Secondary | ICD-10-CM | POA: Diagnosis not present

## 2018-11-10 DIAGNOSIS — H35723 Serous detachment of retinal pigment epithelium, bilateral: Secondary | ICD-10-CM | POA: Diagnosis not present

## 2018-12-01 DIAGNOSIS — N200 Calculus of kidney: Secondary | ICD-10-CM | POA: Diagnosis not present

## 2018-12-01 DIAGNOSIS — H9193 Unspecified hearing loss, bilateral: Secondary | ICD-10-CM | POA: Diagnosis not present

## 2018-12-01 DIAGNOSIS — H2589 Other age-related cataract: Secondary | ICD-10-CM | POA: Diagnosis not present

## 2018-12-01 DIAGNOSIS — C8330 Diffuse large B-cell lymphoma, unspecified site: Secondary | ICD-10-CM | POA: Diagnosis not present

## 2018-12-01 DIAGNOSIS — Z85850 Personal history of malignant neoplasm of thyroid: Secondary | ICD-10-CM | POA: Diagnosis not present

## 2018-12-01 DIAGNOSIS — E890 Postprocedural hypothyroidism: Secondary | ICD-10-CM | POA: Diagnosis not present

## 2018-12-01 DIAGNOSIS — I700 Atherosclerosis of aorta: Secondary | ICD-10-CM | POA: Diagnosis not present

## 2018-12-01 DIAGNOSIS — I10 Essential (primary) hypertension: Secondary | ICD-10-CM | POA: Diagnosis not present

## 2018-12-01 DIAGNOSIS — M5136 Other intervertebral disc degeneration, lumbar region: Secondary | ICD-10-CM | POA: Diagnosis not present

## 2018-12-09 ENCOUNTER — Other Ambulatory Visit: Payer: Self-pay

## 2018-12-12 ENCOUNTER — Telehealth: Payer: Self-pay | Admitting: *Deleted

## 2018-12-12 NOTE — Telephone Encounter (Signed)
Patient lvm asking if appt for 4/24 with Dr. Irene Limbo was still scheduled. Attempted to contact patient. LVM to inform that at this time, appt is still scheduled, although things are changing daily. Advised him that if there is any change in appts, he will be notified by staff from [facility].

## 2018-12-29 NOTE — Progress Notes (Signed)
HEMATOLOGY/ONCOLOGY CLINIC NOTE  Date of Service: 12/30/2018  Patient Care Team: Billie Ruddy, MD as PCP - General (Family Medicine)  CHIEF COMPLAINTS/PURPOSE OF CONSULTATION:  Diffuse Large B-Cell Lymphoma  HISTORY OF PRESENTING ILLNESS:   Jay Webster is a wonderful 85 y.o. male who has been referred to [place] by surgeon Dr. Armandina Gemma for evaluation and management of Diffuse Large B-Cell Lymphoma. The pt reports that he is doing well overall.   Of note prior to the patient's visit today, pt has had Perineum biopsy completed on 03/11/18 with results revealing Diffuse Large B-Cell Lymphoma. The pt had surgery to remove his irregularly shaped mass in the left anterior portion of the perineum with Dr. Armandina Gemma.  The pt reports first noticing the mass in his left buttock about 2 months ago, after a visit with Dr. Trena Platt. The pt decided to consult his surgeon Dr. Armandina Gemma whom he previously had a thyroidectomy with. The pt notes that the spot became as large as a golf ball and did not breach the skin. The pt notes that it wasn't terribly uncomfortable to sit on. He denies feeling any differently recently than in the past  6 months to a year.   The pt notes that he has been having some continued discharge and is using Depends. He is changing his gauze twice each day.   He denies any recent medical problems. He continues to function independently at home and lives alone, as his partner of 30+ years died in the past year. He notes that he does not have any family members or friends who he would want to include in his care. He denies any difficulties functioning day to day and denies concerns for his memory.   He has been on thyroid replacement for the last 10 years following a thyroidectomy after thyroid cancer, and was treated with one dose of radioactive iodine.   The pt also notes a uric acid kidney stone history which hasn't been a problem in many years.    Most  recent lab results (02/28/18) of CBC  is as follows: all values are WNL except for HCT at 38.3.  On review of systems, pt reports left buttock surgical wound, ganglion cyst on right wrist, and denies fevers, chills, night sweats, unexpected weight loss, new fatigue, pain along the spine, leg swelling, noticing any new lumps or bumps, and any other symptoms.   On PMHx the pt denies heart problems, strokes, abdominal problems, lung problems.  On Social Hx the pt reports that he quit smoking in 1990, and used to smoke 1-2 packs of cigarettes each day before this. He denies excess alcohol being a problem, and consumes one glass of wine rarely. 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  On Family Hx the pt reports brother died of sarcoma, different brother with Type I DM, and denies other cancer or blood disorders.  Interval History:    Jay Webster returns today for management and evaluation of his Diffuse Large B-Cell Lymphoma. The patient's last visit with [place] was on 09/30/18.  The pt reports that he has felt fine overall. He notes that he has continued keeping up with his yard work for his exercise. He endorses stable energy levels. He denies any concerns for infections at this time. He also endorses seasonal allergies. The pt denies noticing any new lumps or bumps.  Lab results today (12/30/18) of CBC w/diff and  CMP is as follows: all values are WNL except for RBC at 3.91, HGB at 11.5, HCT at 35.1, Albumin at 3.3. 12/30/18 LDH is . Lab Results  Component Value Date   LDH 126 12/30/2018   On review of systems, pt reports stable energy levels, staying active, and denies concerns for infections, new lumps or bumps, back pains, and any other symptoms.   MEDICAL HISTORY:  Past Medical History:  Diagnosis Date  . Arthritis    OA AND PAIN LEFT HIP AND OTHER JOINT PAINS  . Cancer (HCC)    THYROID CANCER - HAD THYROID REMOVED  . History of kidney stones    . History of shingles    RESOLVED  . Hypertension   . Hypothyroidism   . Low back pain   . Macular degeneration of both eyes   . RBBB (right bundle branch block with left anterior fascicular block)   . Seasonal allergies   . Thyroid disease   . Uric acid renal calculus 06/20/2007   Qualifier: Diagnosis of  By: Rogue Bussing CMA, Maryann Alar      SURGICAL HISTORY: Past Surgical History:  Procedure Laterality Date  . APPENDECTOMY  1948  . COLONOSCOPY    . Elbow Radical Reduction  1973  . IR IMAGING GUIDED PORT INSERTION  05/05/2018  . IR REMOVAL TUN ACCESS W/ PORT W/O FL MOD SED  10/18/2018  . [place]  . MASS EXCISION N/A 03/11/2018   Procedure: EXCISION MASS OF PERINEUM;  Surgeon: Armandina Gemma, MD;  Location: [facility];  Service: General;  Laterality: N/A;  . REMOVAL OF FIRST RIB   NOV 1994   FOR COMPRESSED VEIN BETWEEN 1 ST RIB AND COLLARBONE  . Rib removed, 1st  1994  . ROTATOR CUFF REPAIR  2009  . THYROIDECTOMY  2010  . TOTAL HIP ARTHROPLASTY Left 05/31/2013   Procedure: LEFT TOTAL HIP ARTHROPLASTY ANTERIOR APPROACH;  Surgeon: Gearlean Alf, MD;  Location: [facility];  Service: Orthopedics;  Laterality: Left;    SOCIAL HISTORY: Social History   Socioeconomic History  . Marital status: Widowed    Spouse name: Not on file  . Number of children: Not on file  . Years of education: Not on file  . Highest education level: Not on file  Occupational History  . Occupation: retired  Scientific laboratory technician  . Financial resource strain: Not on file  . Food insecurity:    Worry: Not on file    Inability: Not on file  . Transportation needs:    Medical: Not on file    Non-medical: Not on file  Tobacco Use  . Smoking status: Former Smoker    Types: Cigarettes    Last attempt to quit: 02/22/1979    Years since quitting: 39.8  . Smokeless tobacco: Never Used  Substance and Sexual Activity  . Alcohol use: Yes    Alcohol/week: 0.0 standard drinks  . Drug use: No  . Sexual  activity: Yes  Lifestyle  . Physical activity:    Days per week: Not on file    Minutes per session: Not on file  . Stress: Not on file  Relationships  . Social connections:    Talks on phone: Not on file    Gets together: Not on file    Attends religious service: Not on file    Active member of club or organization: Not on file    Attends meetings of clubs or organizations: Not on file    Relationship  status: Not on file  . Intimate partner violence:    Fear of current or ex partner: Not on file    Emotionally abused: Not on file    Physically abused: Not on file    Forced sexual activity: Not on file  Other Topics Concern  . Not on file  Social History Narrative  . Not on file    FAMILY HISTORY: Family History  Problem Relation Age of Onset  . Heart disease Mother   . Diabetes Mother   . Anemia Other   . Thyroid disease Other     ALLERGIES:  is allergic to penicillins.  MEDICATIONS:  Current Outpatient Medications  Medication Sig Dispense Refill  . acetaminophen (TYLENOL) 325 MG tablet Take 325-650 mg by mouth every 6 (six) hours as needed for headache.     . Aflibercept (EYLEA) 2 MG/0.05ML SOLN Inject 1 Dose into the eye every 8 (eight) weeks.     Marland Kitchen allopurinol (ZYLOPRIM) 300 MG tablet TAKE 1 TABLET EVERY DAY (Patient taking differently: Take 300 mg by mouth daily after supper. ) 90 tablet 1  . benazepril (LOTENSIN) 20 MG tablet TAKE 1 TABLET EVERY DAY (Patient taking differently: Take 20 mg by mouth daily. ) 90 tablet 1  . cetirizine (ZYRTEC) 10 MG tablet Take 10 mg by mouth daily as needed for allergies.    . fluticasone (FLONASE) 50 MCG/ACT nasal spray Place 2 sprays into both nostrils daily. 16 g 0  . Glucosamine-Chondroitin (OSTEO BI-FLEX REGULAR STRENGTH PO) Take 1 tablet by mouth 2 (two) times daily.    Marland Kitchen levothyroxine (SYNTHROID, LEVOTHROID) 125 MCG tablet Take 1 tablet (125 mcg total) by mouth daily. 30 tablet 0  . lidocaine-prilocaine (EMLA) cream Apply to  affected area once 30 g 3  . Multiple Vitamin (MULTIVITAMIN) tablet Take 1 tablet by mouth daily.    . Multiple Vitamins-Minerals (PRESERVISION AREDS 2 PO) Take 1 tablet by mouth 2 (two) times daily.    Vladimir Faster Glycol-Propyl Glycol (SYSTANE OP) Place 1 drop into both eyes daily as needed (irritation).    . sodium chloride (OCEAN) 0.65 % SOLN nasal spray Place 1 spray into both nostrils as needed for congestion.     No current facility-administered medications for this visit.     REVIEW OF SYSTEMS:    A 10+ POINT REVIEW OF SYSTEMS WAS OBTAINED including neurology, dermatology, psychiatry, cardiac, respiratory, lymph, extremities, GI, GU, Musculoskeletal, constitutional, breasts, reproductive, HEENT.  All pertinent positives are noted in the HPI.  All others are negative.   PHYSICAL EXAMINATION: ECOG PERFORMANCE STATUS: 2 - Symptomatic, <50% confined to bed  Vitals:   12/30/18 1020  BP: 117/68  Pulse: 75  Resp: 18  Temp: 97.9 F (36.6 C)  SpO2: 99%   Filed Weights   12/30/18 1020  Weight: 170 lb 1.6 oz (77.2 kg)   .Body mass index is 29.2 kg/m.  GENERAL:alert, in no acute distress and comfortable SKIN: no acute rashes, no significant lesions EYES: conjunctiva are pink and non-injected, sclera anicteric OROPHARYNX: MMM, no exudates, no oropharyngeal erythema or ulceration NECK: supple, no JVD LYMPH:  no palpable lymphadenopathy in the cervical, axillary or inguinal regions LUNGS: clear to auscultation b/l with normal respiratory effort HEART: regular rate & rhythm ABDOMEN:  normoactive bowel sounds , non tender, not distended. No palpable hepatosplenomegaly.  Extremity: no pedal edema PSYCH: alert & oriented x 3 with fluent speech NEURO: no focal motor/sensory deficits   LABORATORY DATA:  I have reviewed the data  as listed  . CBC Latest Ref Rng & Units 12/30/2018 10/18/2018 09/30/2018  WBC 4.0 - 10.5 K/uL 5.1 4.5 7.3  Hemoglobin 13.0 - 17.0 g/dL 11.5(L) 10.7(L)  10.1(L)  Hematocrit 39.0 - 52.0 % 35.1(L) 33.4(L) 30.3(L)  Platelets 150 - 400 K/uL 189 179 207   . CBC    Component Value Date/Time   WBC 5.1 12/30/2018 0939   RBC 3.91 (L) 12/30/2018 0939   HGB 11.5 (L) 12/30/2018 0939   HGB 10.4 (L) 08/02/2018 0828   HCT 35.1 (L) 12/30/2018 0939   PLT 189 12/30/2018 0939   PLT 260 08/02/2018 0828   MCV 89.8 12/30/2018 0939   MCH 29.4 12/30/2018 0939   MCHC 32.8 12/30/2018 0939   RDW 13.6 12/30/2018 0939   LYMPHSABS 0.9 12/30/2018 0939   MONOABS 0.6 12/30/2018 0939   EOSABS 0.1 12/30/2018 0939   BASOSABS 0.0 12/30/2018 0939    CMP Latest Ref Rng & Units 12/30/2018 09/30/2018 09/17/2018  Glucose 70 - 99 mg/dL 97 104(H) 110(H)  BUN 8 - 23 mg/dL 18 15 16   Creatinine 0.61 - 1.24 mg/dL 0.78 0.78 0.65  Sodium 135 - 145 mmol/L 141 139 137  Potassium 3.5 - 5.1 mmol/L 4.6 4.4 3.7  Chloride 98 - 111 mmol/L 107 105 104  CO2 22 - 32 mmol/L 25 26 25   Calcium 8.9 - 10.3 mg/dL 8.9 9.1 8.8(L)  Total Protein 6.5 - 8.1 g/dL 6.6 6.6 6.5  Total Bilirubin 0.3 - 1.2 mg/dL 0.3 0.3 0.6  Alkaline Phos 38 - 126 U/L 89 80 75  AST 15 - 41 U/L 18 16 16   ALT 0 - 44 U/L 18 16 13    . Lab Results  Component Value Date   LDH 126 12/30/2018   Component     Latest Ref Rng & Units 03/24/2018  LDH     98 - 192 U/L 185  HCV Ab     0.0 - 0.9 s/co ratio 0.2  Hep B Core Ab, Tot     Negative Negative  Hepatitis B Surface Ag     Negative Negative  HIV Screen 4th Generation wRfx     Non Reactive Non Reactive    03/11/18 Perineum Bx:    RADIOGRAPHIC STUDIES: I have personally reviewed the radiological images as listed and agreed with the findings in the report. No results found.  ASSESSMENT & PLAN:   85 y.o. male with  1. Diffuse Large B-Cell Lymphoma -newly diagnosed presenting as a mass in the left medial gluteal region/perineum s/p resection. PLAN -03/11/18 Surgical bx which revealed Diffuse Large B-Cell Non-Hodgkin's Lymphoma  -04/13/18 ECHO revealed LV EF of  60-65%  -No Hep B, Hep C or HIV from 03/24/18 labs -04/18/18 PET/CT which revealed Mildly hypermetabolic small to borderline enlarged abdominal and pelvic retroperitoneal lymph nodes. Probable mild hypermetabolism within subcentimeter axillary lymph nodes, with misregistration on fused images. 2. Scattered lucent lesions in the iliac wings and right fifth rib. Continued attention on follow-up exams is warranted. 3. Scattered small pulmonary nodules are nonspecific and too small for PET resolution. Continued attention on follow-up exams is warranted. 4. Intermediate density lesion deep to the left gluteal fold, with associated hypermetabolism, which may represent a complex cyst or abscess. 5. Aortic atherosclerosis. Coronary artery calcification. 6. Left renal stone. -Treatment plan include R-CHOP for 6 cycles starting 05/10/18   07/11/18 PET/CT revealed No evidence of recurrent lymphoma. 2. Left renal stone. 3. Aortic atherosclerosis. Coronary artery calcification.   S/p 6 cycles of  R-CHOP completed on 08/24/18  09/22/18 PET/CT revealed No evidence of hypermetabolic active lymphoma. 2. Aortic atherosclerosis, coronary artery atherosclerosis and emphysema. 3. Left nephrolithiasis. 4. Sinus disease.  PLAN:  -Discussed pt labwork today, 12/30/18; HGB improved to 11.5, other blood counts and chemistries are stable. -The pt shows no clinical or lab progression/return of his DLBCL at this time.  -No indication for further treatment at this time.  -Will see the pt every 3 months for the first two years for lab and clinical watchful observation -Recommend waiting 6 months after completion of chemotherapy for routine dental cleaning, and after Port is removed -Pt notes he is doing well living alone. He notes has a DNR in place at home. He has good support from his niece whom he speaks with daily -Recommended that the pt continue to eat well, drink at least 48-64 oz of water each day, and walk 20-30 minutes each  day.  -Discussed the recommendation to obtain flu vaccines every year, and to obtain both Prevnar and Pneumovax every 5 years. He will check with his PCP in the interim to ensure he has had these. -Will see the pt back in 3 months  2.  Patient Active Problem List   Diagnosis Date Noted  . Diffuse large B-cell lymphoma ([place]) 03/30/2018  . Perineal mass, male 03/11/2018  . Perineal mass in male 03/08/2018  . Degeneration of lumbar intervertebral disc 10/05/2017  . History of total hip arthroplasty 10/05/2017  . Combined forms of age-related cataract of both eyes 04/12/2014  . OA (osteoarthritis) of hip 05/31/2013  . History of revision of total replacement of left hip joint 05/31/2013  . High risk medication use 10/25/2012  . Vitreomacular adhesion 05/24/2012  . Posterior vitreous detachment 04/12/2012  . Cystoid macular edema 11/10/2011  . Subretinal neovascularization of macula 09/11/2011  . Retinal pigment epithelial detachment, bilateral 09/11/2011  . Macular degeneration of both eyes 06/09/2011  . History of thyroid cancer 04/21/2011  . Macular degeneration 01/05/2011  . CARCINOMA, THYROID GLAND, PAPILLARY 11/20/2008  . HYPOTHYROIDISM, POSTSURGICAL 11/20/2008  . Osteoarthritis 07/01/2007  . LOW BACK PAIN 07/01/2007  . Uric acid renal calculus 06/20/2007  . Essential hypertension 06/20/2007   -continue f/u with PCP   RTC with Dr Irene Limbo with labs in 3 months     All of the patients questions were answered with apparent satisfaction. The patient knows to call the clinic with any problems, questions or concerns.  The total time spent in the appt was 20 minutes and more than 50% was on counseling and direct patient cares.   Sullivan Lone MD MS AAHIVMS [facility] Hematology/Oncology Physician [facility]  (Office):       [phone] (Work cell):  [phone] (Fax):           [phone]  12/30/2018 10:45 AM  I, Baldwin Jamaica, am acting as a scribe for Dr.  Sullivan Lone.   .I have reviewed the above documentation for accuracy and completeness, and I agree with the above. Brunetta Genera MD

## 2018-12-30 ENCOUNTER — Telehealth: Payer: Self-pay | Admitting: Hematology

## 2018-12-30 ENCOUNTER — Inpatient Hospital Stay: Payer: Medicare PPO | Admitting: Hematology

## 2018-12-30 ENCOUNTER — Other Ambulatory Visit: Payer: Self-pay

## 2018-12-30 ENCOUNTER — Telehealth: Payer: Self-pay | Admitting: Family Medicine

## 2018-12-30 ENCOUNTER — Inpatient Hospital Stay: Payer: Medicare PPO | Attending: Hematology

## 2018-12-30 VITALS — BP 117/68 | HR 75 | Temp 97.9°F | Resp 18 | Ht 64.0 in | Wt 170.1 lb

## 2018-12-30 DIAGNOSIS — E039 Hypothyroidism, unspecified: Secondary | ICD-10-CM | POA: Diagnosis not present

## 2018-12-30 DIAGNOSIS — M1990 Unspecified osteoarthritis, unspecified site: Secondary | ICD-10-CM | POA: Insufficient documentation

## 2018-12-30 DIAGNOSIS — I700 Atherosclerosis of aorta: Secondary | ICD-10-CM | POA: Insufficient documentation

## 2018-12-30 DIAGNOSIS — I10 Essential (primary) hypertension: Secondary | ICD-10-CM | POA: Insufficient documentation

## 2018-12-30 DIAGNOSIS — I2510 Atherosclerotic heart disease of native coronary artery without angina pectoris: Secondary | ICD-10-CM

## 2018-12-30 DIAGNOSIS — J439 Emphysema, unspecified: Secondary | ICD-10-CM | POA: Diagnosis not present

## 2018-12-30 DIAGNOSIS — C8330 Diffuse large B-cell lymphoma, unspecified site: Secondary | ICD-10-CM

## 2018-12-30 DIAGNOSIS — Z79899 Other long term (current) drug therapy: Secondary | ICD-10-CM

## 2018-12-30 DIAGNOSIS — Z87891 Personal history of nicotine dependence: Secondary | ICD-10-CM | POA: Diagnosis not present

## 2018-12-30 LAB — CBC WITH DIFFERENTIAL/PLATELET
Abs Immature Granulocytes: 0.02 10*3/uL (ref 0.00–0.07)
Basophils Absolute: 0 10*3/uL (ref 0.0–0.1)
Basophils Relative: 1 %
Eosinophils Absolute: 0.1 10*3/uL (ref 0.0–0.5)
Eosinophils Relative: 3 %
HCT: 35.1 % — ABNORMAL LOW (ref 39.0–52.0)
Hemoglobin: 11.5 g/dL — ABNORMAL LOW (ref 13.0–17.0)
Immature Granulocytes: 0 %
Lymphocytes Relative: 18 %
Lymphs Abs: 0.9 10*3/uL (ref 0.7–4.0)
MCH: 29.4 pg (ref 26.0–34.0)
MCHC: 32.8 g/dL (ref 30.0–36.0)
MCV: 89.8 fL (ref 80.0–100.0)
Monocytes Absolute: 0.6 10*3/uL (ref 0.1–1.0)
Monocytes Relative: 12 %
Neutro Abs: 3.4 10*3/uL (ref 1.7–7.7)
Neutrophils Relative %: 66 %
Platelets: 189 10*3/uL (ref 150–400)
RBC: 3.91 MIL/uL — ABNORMAL LOW (ref 4.22–5.81)
RDW: 13.6 % (ref 11.5–15.5)
WBC: 5.1 10*3/uL (ref 4.0–10.5)
nRBC: 0 % (ref 0.0–0.2)

## 2018-12-30 LAB — CMP (CANCER CENTER ONLY)
ALT: 18 U/L (ref 0–44)
AST: 18 U/L (ref 15–41)
Albumin: 3.3 g/dL — ABNORMAL LOW (ref 3.5–5.0)
Alkaline Phosphatase: 89 U/L (ref 38–126)
Anion gap: 9 (ref 5–15)
BUN: 18 mg/dL (ref 8–23)
CO2: 25 mmol/L (ref 22–32)
Calcium: 8.9 mg/dL (ref 8.9–10.3)
Chloride: 107 mmol/L (ref 98–111)
Creatinine: 0.78 mg/dL (ref 0.61–1.24)
GFR, Est AFR Am: >60 mL/min (ref >60)
GFR, Estimated: >60 mL/min (ref >60)
Glucose, Bld: 97 mg/dL (ref 70–99)
Potassium: 4.6 mmol/L (ref 3.5–5.1)
Sodium: 141 mmol/L (ref 135–145)
Total Bilirubin: 0.3 mg/dL (ref 0.3–1.2)
Total Protein: 6.6 g/dL (ref 6.5–8.1)

## 2018-12-30 LAB — LACTATE DEHYDROGENASE: LDH: 126 U/L (ref 98–192)

## 2018-12-30 NOTE — Telephone Encounter (Signed)
Scheduled appt per 4/24 los. ° °A calendar will be mailed out. °

## 2018-12-30 NOTE — Telephone Encounter (Signed)
Copied from [place] [id number]. Topic: Quick Communication - See Telephone Encounter >> Dec 30, 2018  2:11 PM Blase Mess A wrote: CRM for notification. See Telephone encounter for: 12/30/18 Patient is calling back regarding some immunizations. A call was placed to the office. However, Izora Gala was away. Advised to send a CRM. Please advise with the patient. Thank you. [phone] Jerilynn Mages)

## 2018-12-30 NOTE — Telephone Encounter (Signed)
Spoke with patient. Patient coming in 01/02/2019 for pnuemovax vaccine

## 2019-01-02 ENCOUNTER — Other Ambulatory Visit: Payer: Self-pay

## 2019-01-02 ENCOUNTER — Ambulatory Visit (INDEPENDENT_AMBULATORY_CARE_PROVIDER_SITE_OTHER): Payer: Medicare PPO | Admitting: *Deleted

## 2019-01-02 DIAGNOSIS — Z23 Encounter for immunization: Secondary | ICD-10-CM | POA: Diagnosis not present

## 2019-03-09 DIAGNOSIS — H353231 Exudative age-related macular degeneration, bilateral, with active choroidal neovascularization: Secondary | ICD-10-CM | POA: Diagnosis not present

## 2019-03-21 ENCOUNTER — Ambulatory Visit (INDEPENDENT_AMBULATORY_CARE_PROVIDER_SITE_OTHER): Payer: Medicare PPO | Admitting: Family Medicine

## 2019-03-21 ENCOUNTER — Other Ambulatory Visit: Payer: Self-pay

## 2019-03-21 ENCOUNTER — Encounter: Payer: Self-pay | Admitting: Family Medicine

## 2019-03-21 VITALS — BP 139/83 | HR 80 | Temp 97.5°F | Wt 166.0 lb

## 2019-03-21 DIAGNOSIS — I10 Essential (primary) hypertension: Secondary | ICD-10-CM | POA: Diagnosis not present

## 2019-03-21 DIAGNOSIS — Z658 Other specified problems related to psychosocial circumstances: Secondary | ICD-10-CM

## 2019-03-21 DIAGNOSIS — E890 Postprocedural hypothyroidism: Secondary | ICD-10-CM | POA: Diagnosis not present

## 2019-03-21 DIAGNOSIS — H3530 Unspecified macular degeneration: Secondary | ICD-10-CM

## 2019-03-21 MED ORDER — BENAZEPRIL HCL 20 MG PO TABS: ORAL_TABLET | ORAL | 3 refills | Status: DC

## 2019-03-21 NOTE — Progress Notes (Signed)
Virtual Visit via Video Note  I connected with Jay Webster on 03/21/19 at  1:30 PM EDT by a video enabled telemedicine application and verified that I am speaking with the correct person using two identifiers.  Location patient: home Location provider:work or home office Persons participating in the virtual visit: patient, provider  I discussed the limitations of evaluation and management by telemedicine and the availability of in person appointments. The patient expressed understanding and agreed to proceed.   HPI: Pt is an 85 yo male with pmh sig for HTN, postsurgical hypothyroidism, h/o papillary carcinoma, OA, h/o diffuse B cell lymphoma seen for f/u.  Pt states he has "chemo brain that kicks in periodically".  Forgot he needs a refill on benazepril 20 mg.  States bp has been good.  States feeling lonely.  Has a pet cat that keeps him company.  Pt states when the cat "goes" he will "be ready to go".  His nearest relative, a niece, is in [place], [place].  He chats with her via text every morning.  He states she has instructions on "what to do" if he doesn't text, including calling the funeral home.  Pt states this video visit has lifted his spirits.  H/o Macular degeneration.  Notes vision is decreasing.  Does not like driving in unfamiliar areas. Followed by Ophthalmology.  ROS: See pertinent positives and negatives per HPI.  Past Medical History:  Diagnosis Date  . Arthritis    OA AND PAIN LEFT HIP AND OTHER JOINT PAINS  . Cancer (HCC)    THYROID CANCER - HAD THYROID REMOVED  . History of kidney stones   . History of shingles    RESOLVED  . Hypertension   . Hypothyroidism   . Low back pain   . Macular degeneration of both eyes   . RBBB (right bundle branch block with left anterior fascicular block)   . Seasonal allergies   . Thyroid disease   . Uric acid renal calculus 06/20/2007   Qualifier: Diagnosis of  By: Rogue Bussing CMA, Maryann Alar      Past Surgical History:   Procedure Laterality Date  . APPENDECTOMY  1948  . COLONOSCOPY    . Elbow Radical Reduction  1973  . IR IMAGING GUIDED PORT INSERTION  05/05/2018  . IR REMOVAL TUN ACCESS W/ PORT W/O FL MOD SED  10/18/2018  . [place]  . MASS EXCISION N/A 03/11/2018   Procedure: EXCISION MASS OF PERINEUM;  Surgeon: Armandina Gemma, MD;  Location: [facility];  Service: General;  Laterality: N/A;  . REMOVAL OF FIRST RIB   NOV 1994   FOR COMPRESSED VEIN BETWEEN 1 ST RIB AND COLLARBONE  . Rib removed, 1st  1994  . ROTATOR CUFF REPAIR  2009  . THYROIDECTOMY  2010  . TOTAL HIP ARTHROPLASTY Left 05/31/2013   Procedure: LEFT TOTAL HIP ARTHROPLASTY ANTERIOR APPROACH;  Surgeon: Gearlean Alf, MD;  Location: [facility];  Service: Orthopedics;  Laterality: Left;    Family History  Problem Relation Age of Onset  . Heart disease Mother   . Diabetes Mother   . Anemia Other   . Thyroid disease Other       Current Outpatient Medications:  .  acetaminophen (TYLENOL) 325 MG tablet, Take 325-650 mg by mouth every 6 (six) hours as needed for headache. , Disp: , Rfl:  .  Aflibercept (EYLEA) 2 MG/0.05ML SOLN, Inject 1 Dose into the eye every 8 (eight) weeks. , Disp: ,  Rfl:  .  allopurinol (ZYLOPRIM) 300 MG tablet, TAKE 1 TABLET EVERY DAY (Patient taking differently: Take 300 mg by mouth daily after supper. ), Disp: 90 tablet, Rfl: 1 .  benazepril (LOTENSIN) 20 MG tablet, TAKE 1 TABLET EVERY DAY (Patient taking differently: Take 20 mg by mouth daily. ), Disp: 90 tablet, Rfl: 1 .  cetirizine (ZYRTEC) 10 MG tablet, Take 10 mg by mouth daily as needed for allergies., Disp: , Rfl:  .  fluticasone (FLONASE) 50 MCG/ACT nasal spray, Place 2 sprays into both nostrils daily., Disp: 16 g, Rfl: 0 .  Glucosamine-Chondroitin (OSTEO BI-FLEX REGULAR STRENGTH PO), Take 1 tablet by mouth 2 (two) times daily., Disp: , Rfl:  .  levothyroxine (SYNTHROID, LEVOTHROID) 125 MCG tablet, Take 1 tablet (125 mcg total) by mouth daily.,  Disp: 30 tablet, Rfl: 0 .  lidocaine-prilocaine (EMLA) cream, Apply to affected area once, Disp: 30 g, Rfl: 3 .  Multiple Vitamin (MULTIVITAMIN) tablet, Take 1 tablet by mouth daily., Disp: , Rfl:  .  Multiple Vitamins-Minerals (PRESERVISION AREDS 2 PO), Take 1 tablet by mouth 2 (two) times daily., Disp: , Rfl:  .  Polyethyl Glycol-Propyl Glycol (SYSTANE OP), Place 1 drop into both eyes daily as needed (irritation)., Disp: , Rfl:  .  sodium chloride (OCEAN) 0.65 % SOLN nasal spray, Place 1 spray into both nostrils as needed for congestion., Disp: , Rfl:   EXAM:  VITALS per patient if applicable: Temp 69.6  bp 139/83   P 80  Wt 166 lbs  pO2 98%  GENERAL: alert, oriented, appears well and in no acute distress  HEENT: atraumatic, conjunctiva clear, no obvious abnormalities on inspection of external nose and ears  NECK: normal movements of the head and neck  LUNGS: on inspection no signs of respiratory distress, breathing rate appears normal, no obvious gross SOB, gasping or wheezing  CV: no obvious cyanosis  MS: moves all visible extremities without noticeable abnormality  PSYCH/NEURO: pleasant and cooperative, no obvious depression or anxiety, speech and thought processing grossly intact  ASSESSMENT AND PLAN:  Discussed the following assessment and plan:  Essential hypertension  -controlled -continue lifestyle modifications - Plan: benazepril (LOTENSIN) 20 MG tablet,   Macular degeneration of both eyes, unspecified type  -continue f/u with Ophthalmology  Hypothyroidism, post surgical -last TSH 0.17 on 01/17/18 -discussed obtaining TSH in the next few wks. -will see if lab can be drawn during labs visit with oncology.  Order placed. -continue synthroid 125 mcg daily  Feeling lonely -pt encouraged to get some fresh air -discussed considering online video visits with friends and family.  F/u prn in the next few months.  I discussed the assessment and treatment plan with the  patient. The patient was provided an opportunity to ask questions and all were answered. The patient agreed with the plan and demonstrated an understanding of the instructions.   The patient was advised to call back or seek an in-person evaluation if the symptoms worsen or if the condition fails to improve as anticipated.  I provided 25 min of non face to face time during this visit.  Billie Ruddy, MD

## 2019-03-30 NOTE — Progress Notes (Signed)
HEMATOLOGY/ONCOLOGY CLINIC NOTE  Date of Service: 03/31/2019  Patient Care Team: Billie Ruddy, MD as PCP - General (Family Medicine)  CHIEF COMPLAINTS/PURPOSE OF CONSULTATION:  Diffuse Large B-Cell Lymphoma  HISTORY OF PRESENTING ILLNESS:   Jay Webster is a wonderful 85 y.o. male who has been referred to [place] by surgeon Dr. Armandina Gemma for evaluation and management of Diffuse Large B-Cell Lymphoma. The pt reports that he is doing well overall.   Of note prior to the patient's visit today, pt has had Perineum biopsy completed on 03/11/18 with results revealing Diffuse Large B-Cell Lymphoma. The pt had surgery to remove his irregularly shaped mass in the left anterior portion of the perineum with Dr. Armandina Gemma.  The pt reports first noticing the mass in his left buttock about 2 months ago, after a visit with Dr. Trena Platt. The pt decided to consult his surgeon Dr. Armandina Gemma whom he previously had a thyroidectomy with. The pt notes that the spot became as large as a golf ball and did not breach the skin. The pt notes that it wasn't terribly uncomfortable to sit on. He denies feeling any differently recently than in the past  6 months to a year.   The pt notes that he has been having some continued discharge and is using Depends. He is changing his gauze twice each day.   He denies any recent medical problems. He continues to function independently at home and lives alone, as his partner of 30+ years died in the past year. He notes that he does not have any family members or friends who he would want to include in his care. He denies any difficulties functioning day to day and denies concerns for his memory.   He has been on thyroid replacement for the last 10 years following a thyroidectomy after thyroid cancer, and was treated with one dose of radioactive iodine.   The pt also notes a uric acid kidney stone history which hasn't been a problem in many years.    Most  recent lab results (02/28/18) of CBC  is as follows: all values are WNL except for HCT at 38.3.  On review of systems, pt reports left buttock surgical wound, ganglion cyst on right wrist, and denies fevers, chills, night sweats, unexpected weight loss, new fatigue, pain along the spine, leg swelling, noticing any new lumps or bumps, and any other symptoms.   On PMHx the pt denies heart problems, strokes, abdominal problems, lung problems.  On Social Hx the pt reports that he quit smoking in 1990, and used to smoke 1-2 packs of cigarettes each day before this. He denies excess alcohol being a problem, and consumes one glass of wine rarely. 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  On Family Hx the pt reports brother died of sarcoma, different brother with Type I DM, and denies other cancer or blood disorders.  Interval History:    Jay Webster returns today for management and evaluation of his Diffuse Large B-Cell Lymphoma. The patient's last visit with [place] was on 12/30/2018. The pt reports that he is doing well overall.  The pt reports that he has no new concerns. He reports that he does not like staying at home, but he has been avoiding crowds. He notes that he has been tired and lonely since his partner passed away 2 years ago.  Lab results today (03/31/2019) of CBC w/diff and CMP  is as follows: all values are WNL except for RBC at 4.14, HGB at 12.1, HCT at 36.4, glucose bld at 105 03/31/2019 LDH at 142  On review of systems, pt reports that he has been tired and denies new bumps, and any other symptoms.   MEDICAL HISTORY:  Past Medical History:  Diagnosis Date  . Arthritis    OA AND PAIN LEFT HIP AND OTHER JOINT PAINS  . Cancer (HCC)    THYROID CANCER - HAD THYROID REMOVED  . History of kidney stones   . History of shingles    RESOLVED  . Hypertension   . Hypothyroidism   . Low back pain   . Macular degeneration of both eyes   . RBBB  (right bundle branch block with left anterior fascicular block)   . Seasonal allergies   . Thyroid disease   . Uric acid renal calculus 06/20/2007   Qualifier: Diagnosis of  By: Rogue Bussing CMA, Maryann Alar      SURGICAL HISTORY: Past Surgical History:  Procedure Laterality Date  . APPENDECTOMY  1948  . COLONOSCOPY    . Elbow Radical Reduction  1973  . IR IMAGING GUIDED PORT INSERTION  05/05/2018  . IR REMOVAL TUN ACCESS W/ PORT W/O FL MOD SED  10/18/2018  . [place]  . MASS EXCISION N/A 03/11/2018   Procedure: EXCISION MASS OF PERINEUM;  Surgeon: Armandina Gemma, MD;  Location: [facility];  Service: General;  Laterality: N/A;  . REMOVAL OF FIRST RIB   NOV 1994   FOR COMPRESSED VEIN BETWEEN 1 ST RIB AND COLLARBONE  . Rib removed, 1st  1994  . ROTATOR CUFF REPAIR  2009  . THYROIDECTOMY  2010  . TOTAL HIP ARTHROPLASTY Left 05/31/2013   Procedure: LEFT TOTAL HIP ARTHROPLASTY ANTERIOR APPROACH;  Surgeon: Gearlean Alf, MD;  Location: [facility];  Service: Orthopedics;  Laterality: Left;    SOCIAL HISTORY: Social History   Socioeconomic History  . Marital status: Widowed    Spouse name: Not on file  . Number of children: Not on file  . Years of education: Not on file  . Highest education level: Not on file  Occupational History  . Occupation: retired  Scientific laboratory technician  . Financial resource strain: Not on file  . Food insecurity    Worry: Not on file    Inability: Not on file  . Transportation needs    Medical: Not on file    Non-medical: Not on file  Tobacco Use  . Smoking status: Former Smoker    Types: Cigarettes    Quit date: 02/22/1979    Years since quitting: 40.1  . Smokeless tobacco: Never Used  Substance and Sexual Activity  . Alcohol use: Yes    Alcohol/week: 0.0 standard drinks  . Drug use: No  . Sexual activity: Yes  Lifestyle  . Physical activity    Days per week: Not on file    Minutes per session: Not on file  . Stress: Not on file   Relationships  . Social Herbalist on phone: Not on file    Gets together: Not on file    Attends religious service: Not on file    Active member of club or organization: Not on file    Attends meetings of clubs or organizations: Not on file    Relationship status: Not on file  . Intimate partner violence    Fear of current or ex partner: Not on file  Emotionally abused: Not on file    Physically abused: Not on file    Forced sexual activity: Not on file  Other Topics Concern  . Not on file  Social History Narrative  . Not on file    FAMILY HISTORY: Family History  Problem Relation Age of Onset  . Heart disease Mother   . Diabetes Mother   . Anemia Other   . Thyroid disease Other     ALLERGIES:  is allergic to penicillins.  MEDICATIONS:  Current Outpatient Medications  Medication Sig Dispense Refill  . acetaminophen (TYLENOL) 325 MG tablet Take 325-650 mg by mouth every 6 (six) hours as needed for headache.     . Aflibercept (EYLEA) 2 MG/0.05ML SOLN Inject 1 Dose into the eye every 8 (eight) weeks.     Marland Kitchen allopurinol (ZYLOPRIM) 300 MG tablet TAKE 1 TABLET EVERY DAY (Patient taking differently: Take 300 mg by mouth daily after supper. ) 90 tablet 1  . benazepril (LOTENSIN) 20 MG tablet TAKE 1 TABLET EVERY DAY 90 tablet 3  . cetirizine (ZYRTEC) 10 MG tablet Take 10 mg by mouth daily as needed for allergies.    . fluticasone (FLONASE) 50 MCG/ACT nasal spray Place 2 sprays into both nostrils daily. 16 g 0  . Glucosamine-Chondroitin (OSTEO BI-FLEX REGULAR STRENGTH PO) Take 1 tablet by mouth 2 (two) times daily.    Marland Kitchen levothyroxine (SYNTHROID, LEVOTHROID) 125 MCG tablet Take 1 tablet (125 mcg total) by mouth daily. 30 tablet 0  . lidocaine-prilocaine (EMLA) cream Apply to affected area once 30 g 3  . Multiple Vitamin (MULTIVITAMIN) tablet Take 1 tablet by mouth daily.    . Multiple Vitamins-Minerals (PRESERVISION AREDS 2 PO) Take 1 tablet by mouth 2 (two) times daily.     Vladimir Faster Glycol-Propyl Glycol (SYSTANE OP) Place 1 drop into both eyes daily as needed (irritation).    . sodium chloride (OCEAN) 0.65 % SOLN nasal spray Place 1 spray into both nostrils as needed for congestion.     No current facility-administered medications for this visit.     REVIEW OF SYSTEMS:    A 10+ POINT REVIEW OF SYSTEMS WAS OBTAINED including neurology, dermatology, psychiatry, cardiac, respiratory, lymph, extremities, GI, GU, Musculoskeletal, constitutional, breasts, reproductive, HEENT.  All pertinent positives are noted in the HPI.  All others are negative.   PHYSICAL EXAMINATION: ECOG PERFORMANCE STATUS: 2 - Symptomatic, <50% confined to bed  Vitals:   03/31/19 0958  BP: 118/66  Pulse: 69  Resp: 18  Temp: 98.5 F (36.9 C)  SpO2: 100%   Filed Weights   03/31/19 0958  Weight: 166 lb 11.2 oz (75.6 kg)   .Body mass index is 28.61 kg/m.   GENERAL:alert, in no acute distress and comfortable SKIN: no acute rashes, no significant lesions EYES: conjunctiva are pink and non-injected, sclera anicteric OROPHARYNX: MMM, no exudates, no oropharyngeal erythema or ulceration NECK: supple, no JVD LYMPH:  no palpable lymphadenopathy in the cervical, axillary or inguinal regions LUNGS: clear to auscultation b/l with normal respiratory effort HEART: regular rate & rhythm ABDOMEN:  normoactive bowel sounds , non tender, not distended. No palpable hepatosplenomegaly.  Extremity: no pedal edema PSYCH: alert & oriented x 3 with fluent speech NEURO: no focal motor/sensory deficits   LABORATORY DATA:  I have reviewed the data as listed  . CBC Latest Ref Rng & Units 03/31/2019 12/30/2018 10/18/2018  WBC 4.0 - 10.5 K/uL 4.2 5.1 4.5  Hemoglobin 13.0 - 17.0 g/dL 12.1(L) 11.5(L) 10.7(L)  Hematocrit  39.0 - 52.0 % 36.4(L) 35.1(L) 33.4(L)  Platelets 150 - 400 K/uL 181 189 179   . CBC    Component Value Date/Time   WBC 4.2 03/31/2019 0822   RBC 4.14 (L) 03/31/2019 0822   HGB  12.1 (L) 03/31/2019 0822   HGB 10.4 (L) 08/02/2018 0828   HCT 36.4 (L) 03/31/2019 0822   PLT 181 03/31/2019 0822   PLT 260 08/02/2018 0828   MCV 87.9 03/31/2019 0822   MCH 29.2 03/31/2019 0822   MCHC 33.2 03/31/2019 0822   RDW 13.2 03/31/2019 0822   LYMPHSABS 1.1 03/31/2019 0822   MONOABS 0.5 03/31/2019 0822   EOSABS 0.1 03/31/2019 0822   BASOSABS 0.0 03/31/2019 0822    CMP Latest Ref Rng & Units 03/31/2019 12/30/2018 09/30/2018  Glucose 70 - 99 mg/dL 105(H) 97 104(H)  BUN 8 - 23 mg/dL 19 18 15   Creatinine 0.61 - 1.24 mg/dL 0.79 0.78 0.78  Sodium 135 - 145 mmol/L 141 141 139  Potassium 3.5 - 5.1 mmol/L 4.2 4.6 4.4  Chloride 98 - 111 mmol/L 108 107 105  CO2 22 - 32 mmol/L 24 25 26   Calcium 8.9 - 10.3 mg/dL 9.1 8.9 9.1  Total Protein 6.5 - 8.1 g/dL 7.1 6.6 6.6  Total Bilirubin 0.3 - 1.2 mg/dL 0.4 0.3 0.3  Alkaline Phos 38 - 126 U/L 95 89 80  AST 15 - 41 U/L 17 18 16   ALT 0 - 44 U/L 14 18 16    . Lab Results  Component Value Date   LDH 142 03/31/2019   Component     Latest Ref Rng & Units 03/24/2018  LDH     98 - 192 U/L 185  HCV Ab     0.0 - 0.9 s/co ratio 0.2  Hep B Core Ab, Tot     Negative Negative  Hepatitis B Surface Ag     Negative Negative  HIV Screen 4th Generation wRfx     Non Reactive Non Reactive    03/11/18 Perineum Bx:    RADIOGRAPHIC STUDIES: I have personally reviewed the radiological images as listed and agreed with the findings in the report. No results found.  ASSESSMENT & PLAN:   85 y.o. male with  1. Diffuse Large B-Cell Lymphoma -newly diagnosed presenting as a mass in the left medial gluteal region/perineum s/p resection. PLAN -03/11/18 Surgical bx which revealed Diffuse Large B-Cell Non-Hodgkin's Lymphoma  -04/13/18 ECHO revealed LV EF of 60-65%  -No Hep B, Hep C or HIV from 03/24/18 labs -04/18/18 PET/CT which revealed Mildly hypermetabolic small to borderline enlarged abdominal and pelvic retroperitoneal lymph nodes. Probable mild  hypermetabolism within subcentimeter axillary lymph nodes, with misregistration on fused images. 2. Scattered lucent lesions in the iliac wings and right fifth rib. Continued attention on follow-up exams is warranted. 3. Scattered small pulmonary nodules are nonspecific and too small for PET resolution. Continued attention on follow-up exams is warranted. 4. Intermediate density lesion deep to the left gluteal fold, with associated hypermetabolism, which may represent a complex cyst or abscess. 5. Aortic atherosclerosis. Coronary artery calcification. 6. Left renal stone. -Treatment plan include R-CHOP for 6 cycles starting 05/10/18   07/11/18 PET/CT revealed No evidence of recurrent lymphoma. 2. Left renal stone. 3. Aortic atherosclerosis. Coronary artery calcification.   S/p 6 cycles of R-CHOP completed on 08/24/18  09/22/18 PET/CT revealed No evidence of hypermetabolic active lymphoma. 2. Aortic atherosclerosis, coronary artery atherosclerosis and emphysema. 3. Left nephrolithiasis. 4. Sinus disease.  PLAN:  -Discussed pt  labwork today, 03/31/2019; labs are stable, HGB is improving, PLTS and white counts are normal -The pt shows no clinical or lab progression/return of his DLBCL at this time.  -No indication for further treatment at this time.  -Will see the pt every 3 months for the first two years for lab and clinical watchful observation -Recommended that the pt continue to eat well, drink at least 48-64 oz of water each day, and walk 20-30 minutes each day. -Pt received Pneumovax vaccine in 12/2018, Prevnar is due in 09/2019  -Will see the pt back in 3 months  2.  Patient Active Problem List   Diagnosis Date Noted  . Diffuse large B-cell lymphoma ([place]) 03/30/2018  . Perineal mass, male 03/11/2018  . Perineal mass in male 03/08/2018  . Degeneration of lumbar intervertebral disc 10/05/2017  . History of total hip arthroplasty 10/05/2017  . Combined forms of age-related cataract of both eyes  04/12/2014  . OA (osteoarthritis) of hip 05/31/2013  . History of revision of total replacement of left hip joint 05/31/2013  . High risk medication use 10/25/2012  . Vitreomacular adhesion 05/24/2012  . Posterior vitreous detachment 04/12/2012  . Cystoid macular edema 11/10/2011  . Subretinal neovascularization of macula 09/11/2011  . Retinal pigment epithelial detachment, bilateral 09/11/2011  . Macular degeneration of both eyes 06/09/2011  . History of thyroid cancer 04/21/2011  . Macular degeneration 01/05/2011  . CARCINOMA, THYROID GLAND, PAPILLARY 11/20/2008  . HYPOTHYROIDISM, POSTSURGICAL 11/20/2008  . Osteoarthritis 07/01/2007  . LOW BACK PAIN 07/01/2007  . Uric acid renal calculus 06/20/2007  . Essential hypertension 06/20/2007   -continue f/u with PCP   RTC with Dr Irene Limbo with labs in 3 months     All of the patients questions were answered with apparent satisfaction. The patient knows to call the clinic with any problems, questions or concerns.  The total time spent in the appt was 20 minutes and more than 50% was on counseling and direct patient cares.  Sullivan Lone MD MS AAHIVMS [facility] [place] Hematology/Oncology Physician [facility]  (Office):       [phone] (Work cell):  [phone] (Fax):           [phone]  03/31/2019 10:29 AM  I, De Burrs, am acting as a scribe for Dr. Irene Limbo  .I have reviewed the above documentation for accuracy and completeness, and I agree with the above. Brunetta Genera MD

## 2019-03-31 ENCOUNTER — Telehealth: Payer: Self-pay | Admitting: Hematology

## 2019-03-31 ENCOUNTER — Other Ambulatory Visit: Payer: Self-pay

## 2019-03-31 ENCOUNTER — Inpatient Hospital Stay: Payer: Medicare PPO | Attending: Hematology

## 2019-03-31 ENCOUNTER — Inpatient Hospital Stay: Payer: Medicare PPO | Admitting: Hematology

## 2019-03-31 VITALS — BP 118/66 | HR 69 | Temp 98.5°F | Resp 18 | Ht 64.0 in | Wt 166.7 lb

## 2019-03-31 DIAGNOSIS — I10 Essential (primary) hypertension: Secondary | ICD-10-CM | POA: Diagnosis not present

## 2019-03-31 DIAGNOSIS — I2510 Atherosclerotic heart disease of native coronary artery without angina pectoris: Secondary | ICD-10-CM | POA: Diagnosis not present

## 2019-03-31 DIAGNOSIS — Z87891 Personal history of nicotine dependence: Secondary | ICD-10-CM | POA: Diagnosis not present

## 2019-03-31 DIAGNOSIS — E890 Postprocedural hypothyroidism: Secondary | ICD-10-CM | POA: Insufficient documentation

## 2019-03-31 DIAGNOSIS — C8335 Diffuse large B-cell lymphoma, lymph nodes of inguinal region and lower limb: Secondary | ICD-10-CM | POA: Insufficient documentation

## 2019-03-31 DIAGNOSIS — Z85850 Personal history of malignant neoplasm of thyroid: Secondary | ICD-10-CM | POA: Diagnosis not present

## 2019-03-31 DIAGNOSIS — C8330 Diffuse large B-cell lymphoma, unspecified site: Secondary | ICD-10-CM

## 2019-03-31 DIAGNOSIS — M1990 Unspecified osteoarthritis, unspecified site: Secondary | ICD-10-CM

## 2019-03-31 DIAGNOSIS — Z79899 Other long term (current) drug therapy: Secondary | ICD-10-CM | POA: Insufficient documentation

## 2019-03-31 LAB — CBC WITH DIFFERENTIAL/PLATELET
Abs Immature Granulocytes: 0 10*3/uL (ref 0.00–0.07)
Basophils Absolute: 0 10*3/uL (ref 0.0–0.1)
Basophils Relative: 1 %
Eosinophils Absolute: 0.1 10*3/uL (ref 0.0–0.5)
Eosinophils Relative: 3 %
HCT: 36.4 % — ABNORMAL LOW (ref 39.0–52.0)
Hemoglobin: 12.1 g/dL — ABNORMAL LOW (ref 13.0–17.0)
Immature Granulocytes: 0 %
Lymphocytes Relative: 27 %
Lymphs Abs: 1.1 10*3/uL (ref 0.7–4.0)
MCH: 29.2 pg (ref 26.0–34.0)
MCHC: 33.2 g/dL (ref 30.0–36.0)
MCV: 87.9 fL (ref 80.0–100.0)
Monocytes Absolute: 0.5 10*3/uL (ref 0.1–1.0)
Monocytes Relative: 11 %
Neutro Abs: 2.5 10*3/uL (ref 1.7–7.7)
Neutrophils Relative %: 58 %
Platelets: 181 10*3/uL (ref 150–400)
RBC: 4.14 MIL/uL — ABNORMAL LOW (ref 4.22–5.81)
RDW: 13.2 % (ref 11.5–15.5)
WBC: 4.2 10*3/uL (ref 4.0–10.5)
nRBC: 0 % (ref 0.0–0.2)

## 2019-03-31 LAB — CMP (CANCER CENTER ONLY)
ALT: 14 U/L (ref 0–44)
AST: 17 U/L (ref 15–41)
Albumin: 3.7 g/dL (ref 3.5–5.0)
Alkaline Phosphatase: 95 U/L (ref 38–126)
Anion gap: 9 (ref 5–15)
BUN: 19 mg/dL (ref 8–23)
CO2: 24 mmol/L (ref 22–32)
Calcium: 9.1 mg/dL (ref 8.9–10.3)
Chloride: 108 mmol/L (ref 98–111)
Creatinine: 0.79 mg/dL (ref 0.61–1.24)
GFR, Est AFR Am: >60 mL/min (ref >60)
GFR, Estimated: >60 mL/min (ref >60)
Glucose, Bld: 105 mg/dL — ABNORMAL HIGH (ref 70–99)
Potassium: 4.2 mmol/L (ref 3.5–5.1)
Sodium: 141 mmol/L (ref 135–145)
Total Bilirubin: 0.4 mg/dL (ref 0.3–1.2)
Total Protein: 7.1 g/dL (ref 6.5–8.1)

## 2019-03-31 LAB — LACTATE DEHYDROGENASE: LDH: 142 U/L (ref 98–192)

## 2019-03-31 NOTE — Telephone Encounter (Signed)
Scheduled appt per 7/24 los. ° °Printed and mailed appt calendar °

## 2019-05-04 ENCOUNTER — Encounter: Payer: Self-pay | Admitting: Family Medicine

## 2019-05-04 ENCOUNTER — Ambulatory Visit (INDEPENDENT_AMBULATORY_CARE_PROVIDER_SITE_OTHER): Payer: Medicare PPO | Admitting: Family Medicine

## 2019-05-04 ENCOUNTER — Other Ambulatory Visit: Payer: Self-pay

## 2019-05-04 ENCOUNTER — Other Ambulatory Visit: Payer: Self-pay | Admitting: Family Medicine

## 2019-05-04 VITALS — BP 112/70 | HR 80 | Temp 97.7°F | Wt 165.0 lb

## 2019-05-04 DIAGNOSIS — Z125 Encounter for screening for malignant neoplasm of prostate: Secondary | ICD-10-CM

## 2019-05-04 DIAGNOSIS — R21 Rash and other nonspecific skin eruption: Secondary | ICD-10-CM | POA: Diagnosis not present

## 2019-05-04 DIAGNOSIS — I10 Essential (primary) hypertension: Secondary | ICD-10-CM | POA: Diagnosis not present

## 2019-05-04 DIAGNOSIS — Z0000 Encounter for general adult medical examination without abnormal findings: Secondary | ICD-10-CM | POA: Diagnosis not present

## 2019-05-04 DIAGNOSIS — E782 Mixed hyperlipidemia: Secondary | ICD-10-CM | POA: Diagnosis not present

## 2019-05-04 DIAGNOSIS — E890 Postprocedural hypothyroidism: Secondary | ICD-10-CM

## 2019-05-04 DIAGNOSIS — F339 Major depressive disorder, recurrent, unspecified: Secondary | ICD-10-CM | POA: Diagnosis not present

## 2019-05-04 LAB — LIPID PANEL
Cholesterol: 150 mg/dL (ref 0–200)
HDL: 62.1 mg/dL (ref >39.00)
LDL Cholesterol: 74 mg/dL (ref 0–99)
NonHDL: 87.8
Total CHOL/HDL Ratio: 2
Triglycerides: 67 mg/dL (ref 0.0–149.0)
VLDL: 13.4 mg/dL (ref 0.0–40.0)

## 2019-05-04 LAB — BASIC METABOLIC PANEL
BUN: 16 mg/dL (ref 6–23)
CO2: 29 mEq/L (ref 19–32)
Calcium: 9.1 mg/dL (ref 8.4–10.5)
Chloride: 102 mEq/L (ref 96–112)
Creatinine, Ser: 0.71 mg/dL (ref 0.40–1.50)
GFR: 105.47 mL/min (ref >60.00)
Glucose, Bld: 95 mg/dL (ref 70–99)
Potassium: 4 mEq/L (ref 3.5–5.1)
Sodium: 138 mEq/L (ref 135–145)

## 2019-05-04 LAB — TSH: TSH: 0.47 u[IU]/mL (ref 0.35–4.50)

## 2019-05-04 LAB — CBC WITH DIFFERENTIAL/PLATELET
Basophils Absolute: 0.1 10*3/uL (ref 0.0–0.1)
Basophils Relative: 1 % (ref 0.0–3.0)
Eosinophils Absolute: 0.1 10*3/uL (ref 0.0–0.7)
Eosinophils Relative: 2.5 % (ref 0.0–5.0)
HCT: 35.9 % — ABNORMAL LOW (ref 39.0–52.0)
Hemoglobin: 12.1 g/dL — ABNORMAL LOW (ref 13.0–17.0)
Lymphocytes Relative: 14.8 % (ref 12.0–46.0)
Lymphs Abs: 0.8 10*3/uL (ref 0.7–4.0)
MCHC: 33.8 g/dL (ref 30.0–36.0)
MCV: 88.8 fl (ref 78.0–100.0)
Monocytes Absolute: 0.5 10*3/uL (ref 0.1–1.0)
Monocytes Relative: 8.9 % (ref 3.0–12.0)
Neutro Abs: 3.9 10*3/uL (ref 1.4–7.7)
Neutrophils Relative %: 72.8 % (ref 43.0–77.0)
Platelets: 207 10*3/uL (ref 150.0–400.0)
RBC: 4.04 Mil/uL — ABNORMAL LOW (ref 4.22–5.81)
RDW: 14 % (ref 11.5–15.5)
WBC: 5.4 10*3/uL (ref 4.0–10.5)

## 2019-05-04 LAB — PSA: PSA: 1.62 ng/mL (ref 0.10–4.00)

## 2019-05-04 MED ORDER — BENAZEPRIL HCL 20 MG PO TABS: ORAL_TABLET | ORAL | 3 refills | Status: DC

## 2019-05-04 MED ORDER — ALLOPURINOL 300 MG PO TABS: ORAL_TABLET | ORAL | 0 refills | Status: DC

## 2019-05-04 MED ORDER — LEVOTHYROXINE SODIUM 125 MCG PO TABS: 125.0000 ug | ORAL_TABLET | Freq: Every day | ORAL | 0 refills | Status: DC

## 2019-05-04 MED ORDER — LEVOTHYROXINE SODIUM 125 MCG PO TABS: 125.0000 ug | ORAL_TABLET | Freq: Every day | ORAL | 3 refills | Status: DC

## 2019-05-04 MED ORDER — HYDROCORTISONE VALERATE 0.2 % EX CREA: 1.0000 "application " | TOPICAL_CREAM | Freq: Two times a day (BID) | CUTANEOUS | 0 refills | Status: DC

## 2019-05-04 MED ORDER — ALLOPURINOL 300 MG PO TABS: ORAL_TABLET | ORAL | 3 refills | Status: DC

## 2019-05-04 MED ORDER — SERTRALINE HCL 50 MG PO TABS: 50.0000 mg | ORAL_TABLET | Freq: Every day | ORAL | 3 refills | Status: DC

## 2019-05-04 NOTE — Patient Instructions (Signed)
Preventive Care 75 Years and Older, Male Preventive care refers to lifestyle choices and visits with your health care provider that can promote health and wellness. This includes:  A yearly physical exam. This is also called an annual well check.  Regular dental and eye exams.  Immunizations.  Screening for certain conditions.  Healthy lifestyle choices, such as diet and exercise. What can I expect for my preventive care visit? Physical exam Your health care provider will check:  Height and weight. These may be used to calculate body mass index (BMI), which is a measurement that tells if you are at a healthy weight.  Heart rate and blood pressure.  Your skin for abnormal spots. Counseling Your health care provider may ask you questions about:  Alcohol, tobacco, and drug use.  Emotional well-being.  Home and relationship well-being.  Sexual activity.  Eating habits.  History of falls.  Memory and ability to understand (cognition).  Work and work Statistician. What immunizations do I need?  Influenza (flu) vaccine  This is recommended every year. Tetanus, diphtheria, and pertussis (Tdap) vaccine  You may need a Td booster every 10 years. Varicella (chickenpox) vaccine  You may need this vaccine if you have not already been vaccinated. Zoster (shingles) vaccine  You may need this after age 50. Pneumococcal conjugate (PCV13) vaccine  One dose is recommended after age 24. Pneumococcal polysaccharide (PPSV23) vaccine  One dose is recommended after age 33. Measles, mumps, and rubella (MMR) vaccine  You may need at least one dose of MMR if you were born in 1957 or later. You may also need a second dose. Meningococcal conjugate (MenACWY) vaccine  You may need this if you have certain conditions. Hepatitis A vaccine  You may need this if you have certain conditions or if you travel or work in places where you may be exposed to hepatitis A. Hepatitis B vaccine   You may need this if you have certain conditions or if you travel or work in places where you may be exposed to hepatitis B. Haemophilus influenzae type b (Hib) vaccine  You may need this if you have certain conditions. You may receive vaccines as individual doses or as more than one vaccine together in one shot (combination vaccines). Talk with your health care provider about the risks and benefits of combination vaccines. What tests do I need? Blood tests  Lipid and cholesterol levels. These may be checked every 5 years, or more frequently depending on your overall health.  Hepatitis C test.  Hepatitis B test. Screening  Lung cancer screening. You may have this screening every year starting at age 74 if you have a 30-pack-year history of smoking and currently smoke or have quit within the past 15 years.  Colorectal cancer screening. All adults should have this screening starting at age 57 and continuing until age 54. Your health care provider may recommend screening at age 47 if you are at increased risk. You will have tests every 1-10 years, depending on your results and the type of screening test.  Prostate cancer screening. Recommendations will vary depending on your family history and other risks.  Diabetes screening. This is done by checking your blood sugar (glucose) after you have not eaten for a while (fasting). You may have this done every 1-3 years.  Abdominal aortic aneurysm (AAA) screening. You may need this if you are a current or former smoker.  Sexually transmitted disease (STD) testing. Follow these instructions at home: Eating and drinking  Eat  a diet that includes fresh fruits and vegetables, whole grains, lean protein, and low-fat dairy products. Limit your intake of foods with high amounts of sugar, saturated fats, and salt.  Take vitamin and mineral supplements as recommended by your health care provider.  Do not drink alcohol if your health care provider  tells you not to drink.  If you drink alcohol: ? Limit how much you have to 0-2 drinks a day. ? Be aware of how much alcohol is in your drink. In the [place] one drink equals one 12 oz bottle of beer (355 mL), one 5 oz glass of wine (148 mL), or one 1 oz glass of hard liquor (44 mL). Lifestyle  Take daily care of your teeth and gums.  Stay active. Exercise for at least 30 minutes on 5 or more days each week.  Do not use any products that contain nicotine or tobacco, such as cigarettes, e-cigarettes, and chewing tobacco. If you need help quitting, ask your health care provider.  If you are sexually active, practice safe sex. Use a condom or other form of protection to prevent STIs (sexually transmitted infections).  Talk with your health care provider about taking a low-dose aspirin or statin. What's next?  Visit your health care provider once a year for a well check visit.  Ask your health care provider how often you should have your eyes and teeth checked.  Stay up to date on all vaccines. This information is not intended to replace advice given to you by your health care provider. Make sure you discuss any questions you have with your health care provider. Document Released: 09/20/2015 Document Revised: 08/18/2018 Document Reviewed: 08/18/2018 Elsevier Patient Education  2020 [place] With Depression Everyone experiences occasional disappointment, sadness, and loss in their lives. When you are feeling down, blue, or sad for at least 2 weeks in a row, it may mean that you have depression. Depression can affect your thoughts and feelings, relationships, daily activities, and physical health. It is caused by changes in the way your brain functions. If you receive a diagnosis of depression, your health care provider will tell you which type of depression you have and what treatment options are available to you. If you are living with depression, there are ways to help you  recover from it and also ways to prevent it from coming back. How to cope with lifestyle changes Coping with stress     Stress is your body's reaction to life changes and events, both good and bad. Stressful situations may include:  Getting married.  The death of a spouse.  Losing a job.  Retiring.  Having a baby. Stress can last just a few hours or it can be ongoing. Stress can play a major role in depression, so it is important to learn both how to cope with stress and how to think about it differently. Talk with your health care provider or a counselor if you would like to learn more about stress reduction. He or she may suggest some stress reduction techniques, such as:  Music therapy. This can include creating music or listening to music. Choose music that you enjoy and that inspires you.  Mindfulness-based meditation. This kind of meditation can be done while sitting or walking. It involves being aware of your normal breaths, rather than trying to control your breathing.  Centering prayer. This is a kind of meditation that involves focusing on a spiritual word or phrase. Choose a word, phrase, or  sacred image that is meaningful to you and that brings you peace.  Deep breathing. To do this, expand your stomach and inhale slowly through your nose. Hold your breath for 3-5 seconds, then exhale slowly, allowing your stomach muscles to relax.  Muscle relaxation. This involves intentionally tensing muscles then relaxing them. Choose a stress reduction technique that fits your lifestyle and personality. Stress reduction techniques take time and practice to develop. Set aside 5-15 minutes a day to do them. Therapists can offer training in these techniques. The training may be covered by some insurance plans. Other things you can do to manage stress include:  Keeping a stress diary. This can help you learn what triggers your stress and ways to control your response.  Understanding what  your limits are and saying no to requests or events that lead to a schedule that is too full.  Thinking about how you respond to certain situations. You may not be able to control everything, but you can control how you react.  Adding humor to your life by watching funny films or TV shows.  Making time for activities that help you relax and not feeling guilty about spending your time this way.  Medicines Your health care provider may suggest certain medicines if he or she feels that they will help improve your condition. Avoid using alcohol and other substances that may prevent your medicines from working properly (may interact). It is also important to:  Talk with your pharmacist or health care provider about all the medicines that you take, their possible side effects, and what medicines are safe to take together.  Make it your goal to take part in all treatment decisions (shared decision-making). This includes giving input on the side effects of medicines. It is best if shared decision-making with your health care provider is part of your total treatment plan. If your health care provider prescribes a medicine, you may not notice the full benefits of it for 4-8 weeks. Most people who are treated for depression need to be on medicine for at least 6-12 months after they feel better. If you are taking medicines as part of your treatment, do not stop taking medicines without first talking to your health care provider. You may need to have the medicine slowly decreased (tapered) over time to decrease the risk of harmful side effects. Relationships Your health care provider may suggest family therapy along with individual therapy and drug therapy. While there may not be family problems that are causing you to feel depressed, it is still important to make sure your family learns as much as they can about your mental health. Having your family's support can help make your treatment successful. How to  recognize changes in your condition Everyone has a different response to treatment for depression. Recovery from major depression happens when you have not had signs of major depression for two months. This may mean that you will start to:  Have more interest in doing activities.  Feel less hopeless than you did 2 months ago.  Have more energy.  Overeat less often, or have better or improving appetite.  Have better concentration. Your health care provider will work with you to decide the next steps in your recovery. It is also important to recognize when your condition is getting worse. Watch for these signs:  Having fatigue or low energy.  Eating too much or too little.  Sleeping too much or too little.  Feeling restless, agitated, or hopeless.  Having trouble  concentrating or making decisions.  Having unexplained physical complaints.  Feeling irritable, angry, or aggressive. Get help as soon as you or your family members notice these symptoms coming back. How to get support and help from others How to talk with friends and family members about your condition  Talking to friends and family members about your condition can provide you with one way to get support and guidance. Reach out to trusted friends or family members, explain your symptoms to them, and let them know that you are working with a health care provider to treat your depression. Financial resources Not all insurance plans cover mental health care, so it is important to check with your insurance carrier. If paying for co-pays or counseling services is a problem, search for a local or county mental health care center. They may be able to offer public mental health care services at low or no cost when you are not able to see a private health care provider. If you are taking medicine for depression, you may be able to get the generic form, which may be less expensive. Some makers of prescription medicines also offer help  to patients who cannot afford the medicines they need. Follow these instructions at home:   Get the right amount and quality of sleep.  Cut down on using caffeine, tobacco, alcohol, and other potentially harmful substances.  Try to exercise, such as walking or lifting small weights.  Take over-the-counter and prescription medicines only as told by your health care provider.  Eat a healthy diet that includes plenty of vegetables, fruits, whole grains, low-fat dairy products, and lean protein. Do not eat a lot of foods that are high in solid fats, added sugars, or salt.  Keep all follow-up visits as told by your health care provider. This is important. Contact a health care provider if:  You stop taking your antidepressant medicines, and you have any of these symptoms: ? Nausea. ? Headache. ? Feeling lightheaded. ? Chills and body aches. ? Not being able to sleep (insomnia).  You or your friends and family think your depression is getting worse. Get help right away if:  You have thoughts of hurting yourself or others. If you ever feel like you may hurt yourself or others, or have thoughts about taking your own life, get help right away. You can go to your nearest emergency department or call:  Your local emergency services (911 in the [place]  A suicide crisis helpline, such as the [facility] at 445-044-0925. This is open 24-hours a day. Summary  If you are living with depression, there are ways to help you recover from it and also ways to prevent it from coming back.  Work with your health care team to create a management plan that includes counseling, stress management techniques, and healthy lifestyle habits. This information is not intended to replace advice given to you by your health care provider. Make sure you discuss any questions you have with your health care provider. Document Released: 07/27/2016 Document Revised: 12/16/2018 Document  Reviewed: 07/27/2016 Elsevier Patient Education  2020 [employer].

## 2019-05-04 NOTE — Progress Notes (Signed)
Subjective:   Jay Webster is a 85 y.o. male who presents for Medicare Annual/Subsequent preventive examination.  Pt states this month is tough as it has been 2 years since his partner died.  Pt states he will start feeling better at the end of next month.  Pt had recent f/u with Oncology for h/o B-cell lymphoma.  Pt has a flu shot yesterday at [facility].  Pt requesting refills on benazepril, allopurinol, and levothyroxine.  Would like 30 d supply sent to local pharmacy and 90 supply sent to mail order.  Stable on current meds.  Pt notes skin irritation on L shoulder, pruritic.  Tried cortisone cream without improvement.    Review of Systems:  +rash +Hearing aids in place +depression       Objective:    Vitals: BP 112/70 (BP Location: Left Arm, Patient Position: Sitting, Cuff Size: Normal)   Pulse 80   Temp 97.7 F (36.5 C) (Temporal)   Wt 165 lb (74.8 kg)   SpO2 98%   BMI 28.32 kg/m   Body mass index is 28.32 kg/m.   Gen. Pleasant, well developed, well-nourished, in NAD HEENT - [facility]/AT, PERRL, conjunctive clear, no scleral icterus, no nasal drainage, pharynx without erythema or exudate. B/l hearing aids in place.  Canals and TMs normal. Neck: No JVD, s/p thyroidectomy.  Well healed surgical incision present.  no carotid bruits Lungs: no use of accessory muscles, CTAB, no wheezes, rales or rhonchi Cardiovascular: RRR, No r/g/m, no peripheral edema Abdomen: BS present, soft, nontender,nondistended Musculoskeletal: No deformities, moves all four extremities, no cyanosis or clubbing, normal tone Neuro:  A&Ox3, CN Webster-XII intact, normal gait Skin:  Warm, dry, intact.  L posterior shoulder with slightly erythematous skin with excoriations no distinct lesions noted.  Also with thin skin of b/l arms.  Mild skin irritation of L forearm.  Advanced Directives 10/18/2018 09/30/2018 09/17/2018 09/08/2018 08/29/2018 08/02/2018 05/31/2018  Does Patient Have a Medical Advance Directive? No No  No No No Yes Yes  Type of Advance Directive - - - - - - Press photographer;Living will  Does patient want to make changes to medical advance directive? - - - - - No - Patient declined No - Patient declined  Copy of [place] in Chart? - - - - - - No - copy requested  Would patient like information on creating a medical advance directive? No - Patient declined - - - No - Patient declined - -  Pre-existing out of facility DNR order (yellow form or pink MOST form) - - - - - - -    Tobacco Social History   Tobacco Use  Smoking Status Former Smoker  . Types: Cigarettes  . Quit date: 02/22/1979  . Years since quitting: 40.2  Smokeless Tobacco Never Used     Counseling given: Not Answered   Past Medical History:  Diagnosis Date  . Arthritis    OA AND PAIN LEFT HIP AND OTHER JOINT PAINS  . Cancer (HCC)    THYROID CANCER - HAD THYROID REMOVED  . History of kidney stones   . History of shingles    RESOLVED  . Hypertension   . Hypothyroidism   . Low back pain   . Macular degeneration of both eyes   . RBBB (right bundle branch block with left anterior fascicular block)   . Seasonal allergies   . Thyroid disease   . Uric acid renal calculus 06/20/2007   Qualifier: Diagnosis  of  By: Rogue Bussing CMA, Maryann Alar     Past Surgical History:  Procedure Laterality Date  . APPENDECTOMY  1948  . COLONOSCOPY    . Elbow Radical Reduction  1973  . IR IMAGING GUIDED PORT INSERTION  05/05/2018  . IR REMOVAL TUN ACCESS W/ PORT W/O FL MOD SED  10/18/2018  . [place] [facility]  . MASS EXCISION N/A 03/11/2018   Procedure: EXCISION MASS OF PERINEUM;  Surgeon: Armandina Gemma, MD;  Location: [facility];  Service: General;  Laterality: N/A;  . REMOVAL OF FIRST RIB   NOV 1994   FOR COMPRESSED VEIN BETWEEN 1 ST RIB AND COLLARBONE  . Rib removed, 1st  1994  . ROTATOR CUFF REPAIR  2009  . THYROIDECTOMY  2010  . TOTAL HIP ARTHROPLASTY Left 05/31/2013   Procedure: LEFT  TOTAL HIP ARTHROPLASTY ANTERIOR APPROACH;  Surgeon: Gearlean Alf, MD;  Location: [facility];  Service: Orthopedics;  Laterality: Left;   Family History  Problem Relation Age of Onset  . Heart disease Mother   . Diabetes Mother   . Anemia Other   . Thyroid disease Other    Social History   Socioeconomic History  . Marital status: Widowed    Spouse name: Not on file  . Number of children: Not on file  . Years of education: Not on file  . Highest education level: Not on file  Occupational History  . Occupation: retired  Scientific laboratory technician  . Financial resource strain: Not on file  . Food insecurity    Worry: Not on file    Inability: Not on file  . Transportation needs    Medical: Not on file    Non-medical: Not on file  Tobacco Use  . Smoking status: Former Smoker    Types: Cigarettes    Quit date: 02/22/1979    Years since quitting: 40.2  . Smokeless tobacco: Never Used  Substance and Sexual Activity  . Alcohol use: Yes    Alcohol/week: 0.0 standard drinks  . Drug use: No  . Sexual activity: Yes  Lifestyle  . Physical activity    Days per week: Not on file    Minutes per session: Not on file  . Stress: Not on file  Relationships  . Social Herbalist on phone: Not on file    Gets together: Not on file    Attends religious service: Not on file    Active member of club or organization: Not on file    Attends meetings of clubs or organizations: Not on file    Relationship status: Not on file  Other Topics Concern  . Not on file  Social History Narrative  . Not on file    Outpatient Encounter Medications as of 05/04/2019  Medication Sig  . acetaminophen (TYLENOL) 325 MG tablet Take 325-650 mg by mouth every 6 (six) hours as needed for headache.   . Aflibercept (EYLEA) 2 MG/0.05ML SOLN Inject 1 Dose into the eye every 8 (eight) weeks.   . benazepril (LOTENSIN) 20 MG tablet TAKE 1 TABLET EVERY DAY  . cetirizine (ZYRTEC) 10 MG tablet Take 10 mg by mouth daily as  needed for allergies.  . fluticasone (FLONASE) 50 MCG/ACT nasal spray Place 2 sprays into both nostrils daily.  . Glucosamine-Chondroitin (OSTEO BI-FLEX REGULAR STRENGTH PO) Take 1 tablet by mouth 2 (two) times daily.  . Multiple Vitamin (MULTIVITAMIN) tablet Take 1 tablet by mouth daily.  . Multiple Vitamins-Minerals (PRESERVISION AREDS  2 PO) Take 1 tablet by mouth 2 (two) times daily.  Vladimir Faster Glycol-Propyl Glycol (SYSTANE OP) Place 1 drop into both eyes daily as needed (irritation).  . sodium chloride (OCEAN) 0.65 % SOLN nasal spray Place 1 spray into both nostrils as needed for congestion.  . [DISCONTINUED] allopurinol (ZYLOPRIM) 300 MG tablet TAKE 1 TABLET EVERY DAY (Patient taking differently: Take 300 mg by mouth daily after supper. )  . [DISCONTINUED] allopurinol (ZYLOPRIM) 300 MG tablet TAKE 1 TABLET EVERY DAY  . [DISCONTINUED] allopurinol (ZYLOPRIM) 300 MG tablet TAKE 1 TABLET EVERY DAY  . [DISCONTINUED] benazepril (LOTENSIN) 20 MG tablet TAKE 1 TABLET EVERY DAY  . [DISCONTINUED] levothyroxine (SYNTHROID) 125 MCG tablet Take 1 tablet (125 mcg total) by mouth daily.  . [DISCONTINUED] levothyroxine (SYNTHROID) 125 MCG tablet Take 1 tablet (125 mcg total) by mouth daily.  . [DISCONTINUED] levothyroxine (SYNTHROID, LEVOTHROID) 125 MCG tablet Take 1 tablet (125 mcg total) by mouth daily.  . hydrocortisone valerate cream (WESTCORT) 0.2 % Apply 1 application topically 2 (two) times daily for 7 days.  Marland Kitchen lidocaine-prilocaine (EMLA) cream Apply to affected area once  . sertraline (ZOLOFT) 50 MG tablet Take 1 tablet (50 mg total) by mouth daily.   No facility-administered encounter medications on file as of 05/04/2019.     Activities of Daily Living In your present state of health, do you have any difficulty performing the following activities: 10/18/2018  Hearing? Y  Vision? N  Difficulty concentrating or making decisions? N  Walking or climbing stairs? N  Dressing or bathing? N  Some  recent data might be hidden    Patient Care Team: Billie Ruddy, MD as PCP - General (Family Medicine) Brunetta Genera, MD as Consulting Physician (Hematology)   Assessment:   This is a routine wellness examination for [place].  Exercise Activities and Dietary recommendations    Goals   None     Fall Risk Fall Risk  01/17/2018 01/07/2016 01/16/2015 01/22/2014 07/21/2013  Falls in the past year? No No No No Yes  Number falls in past yr: - - - - 1  Risk for fall due to : - - - - Impaired balance/gait   Is the patient's home free of loose throw rugs in walkways, pet beds, electrical cords, etc?   no      Handrails on the stairs?   yes      Adequate lighting?   yes  Depression Screen PHQ 2/9 Scores 01/17/2018 01/07/2016 01/16/2015 01/22/2014  PHQ - 2 Score 6 0 0 0  PHQ- 9 Score 7 - - -   Immunization History  Administered Date(s) Administered  . Influenza Whole 06/22/2000, 07/01/2007, 05/17/2009  . Influenza, High Dose Seasonal PF 05/15/2015, 06/03/2018  . Influenza,inj,Quad PF,6+ Mos 05/17/2013  . Influenza-Unspecified 04/27/2014, 04/09/2015, 05/12/2016, 05/25/2017, 06/04/2017, 05/22/2018  . Pneumococcal Conjugate-13 10/02/2014  . Pneumococcal Polysaccharide-23 09/07/2006, 01/02/2019  . Td 09/07/2005  . Tdap 01/07/2016  . Zoster 01/05/2011     Screening Tests Health Maintenance  Topic Date Due  . HEMOGLOBIN A1C  07/10/1932  . FOOT EXAM  06/07/1944  . OPHTHALMOLOGY EXAM  06/07/1942  . INFLUENZA VACCINE  04/08/2019  . TETANUS/TDAP  01/06/2026  . PNA vac Low Risk Adult  Completed   Cancer Screenings: Lung: Low Dose CT Chest recommended if Age 12-80 years, 30 pack-year currently smoking OR have quit w/in 15years. Patient does not qualify. Colorectal: 12/01/2018  Additional Screenings: Hepatitis C Screening: done 03/2018      Plan:  Pt is an 85 yo male seen for subsequent medicare wellness visit.  Depression, recurrent ([place]) -encuraged to continue counseling  -given precautions  - Plan: TSH, CBC with Differential/Platelet, sertraline (ZOLOFT) 50 MG tablet  Essential hypertension  -stable - Plan: Basic metabolic panel, benazepril (LOTENSIN) 20 MG tablet  Mixed hyperlipidemia  - Plan: Lipid panel  Screening for prostate cancer  - Plan: PSA  HYPOTHYROIDISM, POSTSURGICAL  -will send a 30 day supply of levothyroxine to pt's local pharmacy until the 90 day supply can be sent by mail order pharmacy. - Plan: levothyroxine (SYNTHROID) 125 MCG tablet  Rash  -given precautions - Plan: hydrocortisone valerate cream (WESTCORT) 0.2 %  Pt to schedule eye exam  I have personally reviewed and noted the following in the patient's chart:   . Medical and social history . Use of alcohol, tobacco or illicit drugs  . Current medications and supplements . Functional ability and status . Nutritional status . Physical activity . Advanced directives . List of other physicians . Hospitalizations, surgeries, and ER visits in previous 12 months . Vitals . Screenings to include cognitive, depression, and falls . Referrals and appointments  In addition, I have reviewed and discussed with patient certain preventive protocols, quality metrics, and best practice recommendations. A written personalized care plan for preventive services as well as general preventive health recommendations were provided to patient.     Billie Ruddy, MD  05/04/2019

## 2019-05-05 ENCOUNTER — Other Ambulatory Visit: Payer: Self-pay | Admitting: Family Medicine

## 2019-05-05 ENCOUNTER — Other Ambulatory Visit: Payer: Self-pay

## 2019-05-05 DIAGNOSIS — R21 Rash and other nonspecific skin eruption: Secondary | ICD-10-CM

## 2019-05-05 DIAGNOSIS — E890 Postprocedural hypothyroidism: Secondary | ICD-10-CM

## 2019-05-05 DIAGNOSIS — I10 Essential (primary) hypertension: Secondary | ICD-10-CM

## 2019-05-05 MED ORDER — ALLOPURINOL 300 MG PO TABS: ORAL_TABLET | ORAL | 3 refills | Status: DC

## 2019-05-05 MED ORDER — LEVOTHYROXINE SODIUM 125 MCG PO TABS: 125.0000 ug | ORAL_TABLET | Freq: Every day | ORAL | 3 refills | Status: DC

## 2019-05-05 NOTE — Telephone Encounter (Signed)
Please advise 

## 2019-05-11 DIAGNOSIS — H35371 Puckering of macula, right eye: Secondary | ICD-10-CM | POA: Diagnosis not present

## 2019-05-11 DIAGNOSIS — H35723 Serous detachment of retinal pigment epithelium, bilateral: Secondary | ICD-10-CM | POA: Diagnosis not present

## 2019-05-11 DIAGNOSIS — H25813 Combined forms of age-related cataract, bilateral: Secondary | ICD-10-CM | POA: Diagnosis not present

## 2019-05-11 DIAGNOSIS — H353231 Exudative age-related macular degeneration, bilateral, with active choroidal neovascularization: Secondary | ICD-10-CM | POA: Diagnosis not present

## 2019-05-27 ENCOUNTER — Other Ambulatory Visit: Payer: Self-pay | Admitting: Family Medicine

## 2019-06-01 ENCOUNTER — Ambulatory Visit (INDEPENDENT_AMBULATORY_CARE_PROVIDER_SITE_OTHER): Payer: Medicare PPO | Admitting: Family Medicine

## 2019-06-01 ENCOUNTER — Other Ambulatory Visit: Payer: Self-pay

## 2019-06-01 ENCOUNTER — Encounter: Payer: Self-pay | Admitting: Family Medicine

## 2019-06-01 VITALS — BP 110/64 | HR 78 | Temp 98.6°F | Wt 163.0 lb

## 2019-06-01 DIAGNOSIS — T50Z95A Adverse effect of other vaccines and biological substances, initial encounter: Secondary | ICD-10-CM | POA: Diagnosis not present

## 2019-06-01 DIAGNOSIS — L309 Dermatitis, unspecified: Secondary | ICD-10-CM | POA: Diagnosis not present

## 2019-06-01 DIAGNOSIS — H25813 Combined forms of age-related cataract, bilateral: Secondary | ICD-10-CM

## 2019-06-01 MED ORDER — TRIAMCINOLONE ACETONIDE 0.1 % EX OINT: 1.0000 "application " | TOPICAL_OINTMENT | Freq: Two times a day (BID) | CUTANEOUS | 0 refills | Status: AC

## 2019-06-01 NOTE — Progress Notes (Signed)
Subjective:    Patient ID: Jay Webster, male    DOB: 1932/05/11, 85 y.o.   MRN: [id number]  No chief complaint on file.   HPI Patient was seen today for f/u.  Pt notes rash on L posterior shoulder.  Tried benadryl and cortisone creams without relief.  Denies rash elsewhere.  Pt had a shingles vaccine at the pharmacy.  After which he developed erythema, ecchymosis, and soreness of his L arm.  Area is now improving.   Pt has an appointment this wk for cataracts.  States vision in L eye limited to periphery only 2/2 cataract.  Pt expresses concerns about having it removed as he does not have anyone to check on him.  Pt endorses feeling down this month 2/2 the anniversary of his partners death.  Past Medical History:  Diagnosis Date  . Arthritis    OA AND PAIN LEFT HIP AND OTHER JOINT PAINS  . Cancer (HCC)    THYROID CANCER - HAD THYROID REMOVED  . History of kidney stones   . History of shingles    RESOLVED  . Hypertension   . Hypothyroidism   . Low back pain   . Macular degeneration of both eyes   . RBBB (right bundle branch block with left anterior fascicular block)   . Seasonal allergies   . Thyroid disease   . Uric acid renal calculus 06/20/2007   Qualifier: Diagnosis of  By: Rogue Bussing CMA, [place]      Allergies  Allergen Reactions  . Penicillins Swelling, Palpitations and Other (See Comments)    Swelling of joints. Has patient had a PCN reaction causing immediate rash, facial/tongue/throat swelling, SOB or lightheadedness with hypotension: Yes Has patient had a PCN reaction causing severe rash involving mucus membranes or skin necrosis: No Has patient had a PCN reaction that required hospitalization: Yes Has patient had a PCN reaction occurring within the last 10 years: No If all of the above answers are "NO", then may proceed with Cephalosporin use.     ROS General: Denies fever, chills, night sweats, changes in weight, changes in appetite HEENT: Denies  headaches, ear pain, rhinorrhea, sore throat  +changes in vision CV: Denies CP, palpitations, SOB, orthopnea Pulm: Denies SOB, cough, wheezing GI: Denies abdominal pain, nausea, vomiting, diarrhea, constipation GU: Denies dysuria, hematuria, frequency, vaginal discharge Msk: Denies muscle cramps, joint pains Neuro: Denies weakness, numbness, tingling Skin: +rash, bruising Psych: Denies anxiety, hallucinations  +depression     Objective:    Blood pressure 110/64, pulse 78, temperature 98.6 F (37 C), temperature source Oral, weight 163 lb (73.9 kg), SpO2 98 %.   Gen. Pleasant, well-nourished, in no distress, normal affect   HEENT: [place]/AT, face symmetric, no scleral icterus, cataract present, nares patent without drainage Lungs: no accessory muscle use, CTAB, no wheezes or rales Cardiovascular: RRR, no m/r/g, no peripheral edema Musculoskeletal: No deformities, no cyanosis or clubbing, normal tone Neuro:  A&Ox3, CN II-XII intact, normal gait Skin:  Warm, dry, intact.  L posterior upper shoulder with mild edema and excoriation, dry appearing,  No erythema or induration.  L upper arm s/p shingles vaccine as below.      Wt Readings from Last 3 Encounters:  06/01/19 163 lb (73.9 kg)  05/04/19 165 lb (74.8 kg)  03/31/19 166 lb 11.2 oz (75.6 kg)    Lab Results  Component Value Date   WBC 5.4 05/04/2019   HGB 12.1 (L) 05/04/2019   HCT 35.9 (L) 05/04/2019   PLT  207.0 05/04/2019   GLUCOSE 95 05/04/2019   CHOL 150 05/04/2019   TRIG 67.0 05/04/2019   HDL 62.10 05/04/2019   LDLCALC 74 05/04/2019   ALT 14 03/31/2019   AST 17 03/31/2019   NA 138 05/04/2019   K 4.0 05/04/2019   CL 102 05/04/2019   CREATININE 0.71 05/04/2019   BUN 16 05/04/2019   CO2 29 05/04/2019   TSH 0.47 05/04/2019   PSA 1.62 05/04/2019   INR 0.99 10/18/2018    Assessment/Plan:  Dermatitis  - Plan: triamcinolone ointment (KENALOG) 0.1 %  Combined forms of age-related cataract of both eyes -continue f/u  with Ophthalmology  Adverse effect of vaccine, initial encounter -improving -arm s/p shingles vaccine dose 1  -advised not to get 2nd shingles vaccine  F/u prn  Grier Mitts, MD

## 2019-06-01 NOTE — Patient Instructions (Signed)
Cataract  A cataract is a buildup of protein that causes the lens of your eye to become cloudy. The lens is normally clear. It is the part of the eye that is behind your iris and pupil. The lens focuses light on the retina, which lets you see clearly. When a lens becomes cloudy, your vision may become blurry. The clouding can range from a tiny dot to complete cloudiness. As some cataracts develop, they can make it harder for you to see things that are far away. (You become more nearsighted.) Other cataracts increase glare. Cataracts can worsen over time, and sometimes the pupil can look white. As cataracts get worse, they cloud more of the lens, making it difficult to see. Cataracts can affect one eye or both eyes. What are the causes? This condition may be caused by age-related eye changes. The lens of the eye is mostly made up of water and protein. Normally, this protein is arranged in a way that keeps the lens clear. Cataracts develop when protein begins to clump together over time. This buildup of protein clouds the lens and lets less light pass through to the retina, which causes blurry vision. What increases the risk? You are more likely to develop this condition if you:  Are 2 years of age or older.  Have diabetes.  Have high blood pressure.  Take certain medicines, such as steroids or hormone replacement therapy.  Have had an eye injury.  Have or have had eye inflammation.  Have a family history of cataracts.  Smoke.  Drink alcohol heavily.  Are frequently exposed to sun or very strong light without eye protection.  Are obese.  Have been exposed to large amounts of radiation, lead, or other toxic substances.  Have had eye surgery. What are the signs or symptoms? The main symptom of a cataract is blurry vision. Your vision may change or get worse over time. Other symptoms include:  Increased glare.  Seeing a bright ring or halo around light.  Poor night vision.   Double or "shadow" vision in one eye or both eyes.  Having trouble seeing, even while wearing contact lenses or glasses.  Seeing colors that appear faded.  Having trouble telling the difference between blue and purple.  Needing frequent changes to your prescription glasses or contacts. How is this diagnosed? This condition is diagnosed with a medical history and eye exam.  You should see an eye specialist (optometrist or ophthalmologist).  Your health care provider may enlarge (dilate) your pupils with eye drops to see the back of your eye more clearly and look for signs of cataracts or other eye damage. You may also have tests, including:  A visual acuity test. This uses a chart to determine the smallest letters that you can see from a specific distance.  A slit-lamp exam. This uses a microscope to examine small sections of your eye for abnormalities.  Tonometry. This test measures the pressure of the fluid inside your eye.  Glare testing. This test shines a light in your eye while you view letters to see whether the bright light affects your vision. How is this treated? Treatment depends on the stage of your cataract. You may:  Wear eyeglasses or use stronger light. This is for an early-stage cataract.  Have surgery if the condition is severely affecting your vision. This is needed for late-stage cataract.  Stop or change certain medicines. This is recommended if your health care provider thinks your cataract may be linked to your  medicines. Follow these instructions at home: Lifestyle  Use stronger or brighter lighting.  Consider using a magnifying glass for reading or other activities.  Become familiar with your surroundings. Having poor vision can put you at greater risk for tripping, falling, or bumping into things.  Wear sunglasses and a hat if you are sensitive to bright light or are having problems with glare.  Do not use any products that contain nicotine or  tobacco, such as cigarettes, e-cigarettes, and chewing tobacco. If you need help quitting, ask your health care provider. General instructions  If you are prescribed new eyeglasses, wear them as told by your health care provider.  Take over-the-counter and prescription medicines only as told by your health care provider. Do not change your medicines unless told by your health care provider.  Do not drive or use heavy machinery if your vision is blurry, particularly at night.  Keep your blood sugar under control if you have diabetes.  Keep all follow-up visits as told by your health care provider. This is important. Contact a health care provider if:  Your symptoms get worse.  Your vision affects your ability to perform daily activities.  You have new symptoms.  You have a fever. Get help right away if:  You have sudden vision loss.  You have redness, swelling, or increasing pain in your eye.  You develop a headache and sensitivity to light. Summary  A cataract is a buildup of protein that causes the lens of your eye to become cloudy. Cataracts are very common, especially as people age.  Mild cataracts cause mild visual symptoms, while more severe cataracts can cause a significant decrease in quality of life.  Mild cataracts can often be treated with a prescription for new glasses or contact lenses, while surgery is often recommended for more severe cataracts.  Contact a health care provider if your symptoms get worse, your vision affects your ability to do daily activities, or you have a fever.  Get help right away if you have sudden vision loss, redness, swelling, or increasing pain in the eye, or you develop a headache or sensitivity to light. This information is not intended to replace advice given to you by your health care provider. Make sure you discuss any questions you have with your health care provider. Document Released: 08/24/2005 Document Revised: 02/21/2018  Document Reviewed: 02/21/2018 Elsevier Patient Education  2020 [employer].  Atopic Dermatitis Atopic dermatitis is a skin disorder that causes inflammation of the skin. This is the most common type of eczema. Eczema is a group of skin conditions that cause the skin to be itchy, red, and swollen. This condition is generally worse during the cooler winter months and often improves during the warm summer months. Symptoms can vary from person to person. Atopic dermatitis usually starts showing signs in infancy and can last through adulthood. This condition cannot be passed from one person to another (non-contagious), but it is more common in families. Atopic dermatitis may not always be present. When it is present, it is called a flare-up. What are the causes? The exact cause of this condition is not known. Flare-ups of the condition may be triggered by:  Contact with something that you are sensitive or allergic to.  Stress.  Certain foods.  Extremely hot or cold weather.  Harsh chemicals and soaps.  Dry air.  Chlorine. What increases the risk? This condition is more likely to develop in people who have a personal history or family history of  eczema, allergies, asthma, or hay fever. What are the signs or symptoms? Symptoms of this condition include:  Dry, scaly skin.  Red, itchy rash.  Itchiness, which can be severe. This may occur before the skin rash. This can make sleeping difficult.  Skin thickening and cracking that can occur over time. How is this diagnosed? This condition is diagnosed based on your symptoms, a medical history, and a physical exam. How is this treated? There is no cure for this condition, but symptoms can usually be controlled. Treatment focuses on:  Controlling the itchiness and scratching. You may be given medicines, such as antihistamines or steroid creams.  Limiting exposure to things that you are sensitive or allergic to (allergens).  Recognizing  situations that cause stress and developing a plan to manage stress. If your atopic dermatitis does not get better with medicines, or if it is all over your body (widespread), a treatment using a specific type of light (phototherapy) may be used. Follow these instructions at home: Skin care   Keep your skin well-moisturized. Doing this seals in moisture and helps to prevent dryness. ? Use unscented lotions that have petroleum in them. ? Avoid lotions that contain alcohol or water. They can dry the skin.  Keep baths or showers short (less than 5 minutes) in warm water. Do not use hot water. ? Use mild, unscented cleansers for bathing. Avoid soap and bubble bath. ? Apply a moisturizer to your skin right after a bath or shower.  Do not apply anything to your skin without checking with your health care provider. General instructions  Dress in clothes made of cotton or cotton blends. Dress lightly because heat increases itchiness.  When washing your clothes, rinse your clothes twice so all of the soap is removed.  Avoid any triggers that can cause a flare-up.  Try to manage your stress.  Keep your fingernails cut short.  Avoid scratching. Scratching makes the rash and itchiness worse. It may also result in a skin infection (impetigo) due to a break in the skin caused by scratching.  Take or apply over-the-counter and prescription medicines only as told by your health care provider.  Keep all follow-up visits as told by your health care provider. This is important.  Do not be around people who have cold sores or fever blisters. If you get the infection, it may cause your atopic dermatitis to worsen. Contact a health care provider if:  Your itchiness interferes with sleep.  Your rash gets worse or it is not better within one week of starting treatment.  You have a fever.  You have a rash flare-up after having contact with someone who has cold sores or fever blisters. Get help  right away if:  You develop pus or soft yellow scabs in the rash area. Summary  This condition causes a red rash and itchy, dry, scaly skin.  Treatment focuses on controlling the itchiness and scratching, limiting exposure to things that you are sensitive or allergic to (allergens), recognizing situations that cause stress, and developing a plan to manage stress.  Keep your skin well-moisturized.  Keep baths or showers shorter than 5 minutes and use warm water. Do not use hot water. This information is not intended to replace advice given to you by your health care provider. Make sure you discuss any questions you have with your health care provider. Document Released: 08/21/2000 Document Revised: 12/13/2018 Document Reviewed: 09/25/2016 Elsevier Patient Education  2020 [employer].

## 2019-06-02 ENCOUNTER — Telehealth: Payer: Self-pay | Admitting: Family Medicine

## 2019-06-02 DIAGNOSIS — H524 Presbyopia: Secondary | ICD-10-CM | POA: Diagnosis not present

## 2019-06-02 DIAGNOSIS — H2513 Age-related nuclear cataract, bilateral: Secondary | ICD-10-CM | POA: Diagnosis not present

## 2019-06-02 NOTE — Telephone Encounter (Signed)
[facility] is requesting a refill for sertraline (ZOLOFT) 50 MG tablet    Pharmacy:  [facility] [place] [phone] (Phone) [phone] (Fax)

## 2019-06-02 NOTE — Telephone Encounter (Signed)
Rx sent to [facility] per pt

## 2019-06-05 ENCOUNTER — Encounter: Payer: Self-pay | Admitting: Family Medicine

## 2019-06-06 ENCOUNTER — Telehealth: Payer: Self-pay | Admitting: Family Medicine

## 2019-06-06 NOTE — Telephone Encounter (Signed)
Patient would like to transfer from Dr. Volanda Napoleon to Dr. Yong Channel. Patient said its closer to his home.

## 2019-06-07 ENCOUNTER — Telehealth: Payer: Self-pay | Admitting: Family Medicine

## 2019-06-07 NOTE — Telephone Encounter (Signed)
Pt called in, he would like to switch providers to Dr Yong Channel from Dr. Volanda Napoleon.     [facility]:[phone] - please advise/assist pt directly

## 2019-06-07 NOTE — Telephone Encounter (Signed)
Dr. Banks - Please advise on pt's request to transfer care. Thank you! 

## 2019-06-07 NOTE — Telephone Encounter (Signed)
ok 

## 2019-06-08 NOTE — Telephone Encounter (Signed)
Pt requesting transfer to Dr. Yong Channel.

## 2019-06-08 NOTE — Telephone Encounter (Signed)
Patient has been scheduled. Jay Webster it in friends chart.

## 2019-06-27 ENCOUNTER — Other Ambulatory Visit: Payer: Self-pay | Admitting: Family Medicine

## 2019-06-29 NOTE — Progress Notes (Signed)
HEMATOLOGY/ONCOLOGY CLINIC NOTE  Date of Service: 06/30/2019  Patient Care Team: Billie Ruddy, MD as PCP - General (Family Medicine) Brunetta Genera, MD as Consulting Physician (Hematology)  CHIEF COMPLAINTS/PURPOSE OF CONSULTATION:  Diffuse Large B-Cell Lymphoma  HISTORY OF PRESENTING ILLNESS:   Jay Webster is a wonderful 85 y.o. male who has been referred to [place] by surgeon Dr. Armandina Gemma for evaluation and management of Diffuse Large B-Cell Lymphoma. The pt reports that he is doing well overall.   Of note prior to the patient's visit today, pt has had Perineum biopsy completed on 03/11/18 with results revealing Diffuse Large B-Cell Lymphoma. The pt had surgery to remove his irregularly shaped mass in the left anterior portion of the perineum with Dr. Armandina Gemma.  The pt reports first noticing the mass in his left buttock about 2 months ago, after a visit with Dr. Trena Platt. The pt decided to consult his surgeon Dr. Armandina Gemma whom he previously had a thyroidectomy with. The pt notes that the spot became as large as a golf ball and did not breach the skin. The pt notes that it wasn't terribly uncomfortable to sit on. He denies feeling any differently recently than in the past  6 months to a year.   The pt notes that he has been having some continued discharge and is using Depends. He is changing his gauze twice each day.   He denies any recent medical problems. He continues to function independently at home and lives alone, as his partner of 30+ years died in the past year. He notes that he does not have any family members or friends who he would want to include in his care. He denies any difficulties functioning day to day and denies concerns for his memory.   He has been on thyroid replacement for the last 10 years following a thyroidectomy after thyroid cancer, and was treated with one dose of radioactive iodine.   The pt also notes a uric acid kidney stone  history which hasn't been a problem in many years.    Most recent lab results (02/28/18) of CBC  is as follows: all values are WNL except for HCT at 38.3.  On review of systems, pt reports left buttock surgical wound, ganglion cyst on right wrist, and denies fevers, chills, night sweats, unexpected weight loss, new fatigue, pain along the spine, leg swelling, noticing any new lumps or bumps, and any other symptoms.   On PMHx the pt denies heart problems, strokes, abdominal problems, lung problems.  On Social Hx the pt reports that he quit smoking in 1990, and used to smoke 1-2 packs of cigarettes each day before this. He denies excess alcohol being a problem, and consumes one glass of wine rarely. 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  On Family Hx the pt reports brother died of sarcoma, different brother with Type I DM, and denies other cancer or blood disorders.  Interval History:    Jay Webster returns today for management and evaluation of his Diffuse Large B-Cell Lymphoma. The patient's last visit with [place] was on 03/31/19. The pt reports that he is doing well overall.  He received a shingles shot and developed a rash on his left arm and notes that he wontbe having the 2 booster shot.  Lab results today (06/30/19) of CBC w/diff and CMP is as follows: all values are WNL except for RBC at  3.78, Hemoglobin at 11.3, HCT at 33.8 Glucose Bld at 115, Albumin at 3.4.   On review of systems, pt denies lumps, bumps and any other symptoms.   MEDICAL HISTORY:   Past Medical History:  Diagnosis Date  . Arthritis    OA AND PAIN LEFT HIP AND OTHER JOINT PAINS  . Cancer (HCC)    THYROID CANCER - HAD THYROID REMOVED  . History of kidney stones   . History of shingles    RESOLVED  . Hypertension   . Hypothyroidism   . Low back pain   . Macular degeneration of both eyes   . RBBB (right bundle branch block with left anterior fascicular block)   .  Seasonal allergies   . Thyroid disease   . Uric acid renal calculus 06/20/2007   Qualifier: Diagnosis of  By: Rogue Bussing CMA, Maryann Alar      SURGICAL HISTORY: Past Surgical History:  Procedure Laterality Date  . APPENDECTOMY  1948  . COLONOSCOPY    . Elbow Radical Reduction  1973  . IR IMAGING GUIDED PORT INSERTION  05/05/2018  . IR REMOVAL TUN ACCESS W/ PORT W/O FL MOD SED  10/18/2018  . Learned  . MASS EXCISION N/A 03/11/2018   Procedure: EXCISION MASS OF PERINEUM;  Surgeon: Armandina Gemma, MD;  Location: [facility];  Service: General;  Laterality: N/A;  . REMOVAL OF FIRST RIB   NOV 1994   FOR COMPRESSED VEIN BETWEEN 1 ST RIB AND COLLARBONE  . Rib removed, 1st  1994  . ROTATOR CUFF REPAIR  2009  . THYROIDECTOMY  2010  . TOTAL HIP ARTHROPLASTY Left 05/31/2013   Procedure: LEFT TOTAL HIP ARTHROPLASTY ANTERIOR APPROACH;  Surgeon: Gearlean Alf, MD;  Location: [facility];  Service: Orthopedics;  Laterality: Left;    SOCIAL HISTORY: Social History   Socioeconomic History  . Marital status: Widowed    Spouse name: Not on file  . Number of children: Not on file  . Years of education: Not on file  . Highest education level: Not on file  Occupational History  . Occupation: retired  Scientific laboratory technician  . Financial resource strain: Not on file  . Food insecurity    Worry: Not on file    Inability: Not on file  . Transportation needs    Medical: Not on file    Non-medical: Not on file  Tobacco Use  . Smoking status: Former Smoker    Types: Cigarettes    Quit date: 02/22/1979    Years since quitting: 40.3  . Smokeless tobacco: Never Used  Substance and Sexual Activity  . Alcohol use: Yes    Alcohol/week: 0.0 standard drinks  . Drug use: No  . Sexual activity: Yes  Lifestyle  . Physical activity    Days per week: Not on file    Minutes per session: Not on file  . Stress: Not on file  Relationships  . Social Herbalist on phone: Not on file    Gets  together: Not on file    Attends religious service: Not on file    Active member of club or organization: Not on file    Attends meetings of clubs or organizations: Not on file    Relationship status: Not on file  . Intimate partner violence    Fear of current or ex partner: Not on file    Emotionally abused: Not on file    Physically abused: Not on file  Forced sexual activity: Not on file  Other Topics Concern  . Not on file  Social History Narrative  . Not on file    FAMILY HISTORY: Family History  Problem Relation Age of Onset  . Heart disease Mother   . Diabetes Mother   . Anemia Other   . Thyroid disease Other     ALLERGIES:  is allergic to penicillins.  MEDICATIONS:  Current Outpatient Medications  Medication Sig Dispense Refill  . acetaminophen (TYLENOL) 325 MG tablet Take 325-650 mg by mouth every 6 (six) hours as needed for headache.     . Aflibercept (EYLEA) 2 MG/0.05ML SOLN Inject 1 Dose into the eye every 8 (eight) weeks.     Marland Kitchen allopurinol (ZYLOPRIM) 300 MG tablet TAKE 1 TABLET EVERY DAY 90 tablet 3  . benazepril (LOTENSIN) 20 MG tablet TAKE 1 TABLET EVERY DAY 90 tablet 3  . cetirizine (ZYRTEC) 10 MG tablet Take 10 mg by mouth daily as needed for allergies.    . fluticasone (FLONASE) 50 MCG/ACT nasal spray Place 2 sprays into both nostrils daily. 16 g 0  . Glucosamine-Chondroitin (OSTEO BI-FLEX REGULAR STRENGTH PO) Take 1 tablet by mouth 2 (two) times daily.    Marland Kitchen levothyroxine (SYNTHROID) 125 MCG tablet Take 1 tablet (125 mcg total) by mouth daily. 90 tablet 3  . lidocaine-prilocaine (EMLA) cream Apply to affected area once 30 g 3  . Multiple Vitamin (MULTIVITAMIN) tablet Take 1 tablet by mouth daily.    . Multiple Vitamins-Minerals (PRESERVISION AREDS 2 PO) Take 1 tablet by mouth 2 (two) times daily.    Vladimir Faster Glycol-Propyl Glycol (SYSTANE OP) Place 1 drop into both eyes daily as needed (irritation).    . sertraline (ZOLOFT) 50 MG tablet Take 1 tablet  (50 mg total) by mouth daily. 30 tablet 3  . sodium chloride (OCEAN) 0.65 % SOLN nasal spray Place 1 spray into both nostrils as needed for congestion.     No current facility-administered medications for this visit.     REVIEW OF SYSTEMS:    A 10+ POINT REVIEW OF SYSTEMS WAS OBTAINED including neurology, dermatology, psychiatry, cardiac, respiratory, lymph, extremities, GI, GU, Musculoskeletal, constitutional, breasts, reproductive, HEENT.  All pertinent positives are noted in the HPI.  All others are negative.    PHYSICAL EXAMINATION: ECOG FS:2 - Symptomatic, <50% confined to bed  There were no vitals filed for this visit. Wt Readings from Last 3 Encounters:  06/01/19 163 lb (73.9 kg)  05/04/19 165 lb (74.8 kg)  03/31/19 166 lb 11.2 oz (75.6 kg)   There is no height or weight on file to calculate BMI.    GENERAL:alert, in no acute distress and comfortable SKIN: no acute rashes, no significant lesions EYES: conjunctiva are pink and non-injected, sclera anicteric OROPHARYNX: MMM, no exudates, no oropharyngeal erythema or ulceration NECK: supple, no JVD LYMPH:  no palpable lymphadenopathy in the cervical, axillary or inguinal regions LUNGS: clear to auscultation b/l with normal respiratory effort HEART: regular rate & rhythm ABDOMEN:  normoactive bowel sounds , non tender, not distended. Extremity: no pedal edema PSYCH: alert & oriented x 3 with fluent speech NEURO: no focal motor/sensory deficits   LABORATORY DATA:  I have reviewed the data as listed  . CBC Latest Ref Rng & Units 05/04/2019 03/31/2019 12/30/2018  WBC 4.0 - 10.5 K/uL 5.4 4.2 5.1  Hemoglobin 13.0 - 17.0 g/dL 12.1(L) 12.1(L) 11.5(L)  Hematocrit 39.0 - 52.0 % 35.9(L) 36.4(L) 35.1(L)  Platelets 150.0 - 400.0 K/uL 207.0  181 189   . CBC    Component Value Date/Time   WBC 5.4 05/04/2019 0922   RBC 4.04 (L) 05/04/2019 0922   HGB 12.1 (L) 05/04/2019 0922   HGB 10.4 (L) 08/02/2018 0828   HCT 35.9 (L)  05/04/2019 0922   PLT 207.0 05/04/2019 0922   PLT 260 08/02/2018 0828   MCV 88.8 05/04/2019 0922   MCH 29.2 03/31/2019 0822   MCHC 33.8 05/04/2019 0922   RDW 14.0 05/04/2019 0922   LYMPHSABS 0.8 05/04/2019 0922   MONOABS 0.5 05/04/2019 0922   EOSABS 0.1 05/04/2019 0922   BASOSABS 0.1 05/04/2019 0922    CMP Latest Ref Rng & Units 05/04/2019 03/31/2019 12/30/2018  Glucose 70 - 99 mg/dL 95 105(H) 97  BUN 6 - 23 mg/dL 16 19 18   Creatinine 0.40 - 1.50 mg/dL 0.71 0.79 0.78  Sodium 135 - 145 mEq/L 138 141 141  Potassium 3.5 - 5.1 mEq/L 4.0 4.2 4.6  Chloride 96 - 112 mEq/L 102 108 107  CO2 19 - 32 mEq/L 29 24 25   Calcium 8.4 - 10.5 mg/dL 9.1 9.1 8.9  Total Protein 6.5 - 8.1 g/dL - 7.1 6.6  Total Bilirubin 0.3 - 1.2 mg/dL - 0.4 0.3  Alkaline Phos 38 - 126 U/L - 95 89  AST 15 - 41 U/L - 17 18  ALT 0 - 44 U/L - 14 18   . Lab Results  Component Value Date   LDH 142 03/31/2019   Component     Latest Ref Rng & Units 03/24/2018  LDH     98 - 192 U/L 185  HCV Ab     0.0 - 0.9 s/co ratio 0.2  Hep B Core Ab, Tot     Negative Negative  Hepatitis B Surface Ag     Negative Negative  HIV Screen 4th Generation wRfx     Non Reactive Non Reactive    03/11/18 Perineum Bx:    RADIOGRAPHIC STUDIES: I have personally reviewed the radiological images as listed and agreed with the findings in the report. No results found.  ASSESSMENT & PLAN:   85 y.o. male with  1. Diffuse Large B-Cell Lymphoma -newly diagnosed presenting as a mass in the left medial gluteal region/perineum s/p resection. PLAN -03/11/18 Surgical bx which revealed Diffuse Large B-Cell Non-Hodgkin's Lymphoma  -04/13/18 ECHO revealed LV EF of 60-65%  -No Hep B, Hep C or HIV from 03/24/18 labs -04/18/18 PET/CT which revealed Mildly hypermetabolic small to borderline enlarged abdominal and pelvic retroperitoneal lymph nodes. Probable mild hypermetabolism within subcentimeter axillary lymph nodes, with misregistration on fused images.  2. Scattered lucent lesions in the iliac wings and right fifth rib. Continued attention on follow-up exams is warranted. 3. Scattered small pulmonary nodules are nonspecific and too small for PET resolution. Continued attention on follow-up exams is warranted. 4. Intermediate density lesion deep to the left gluteal fold, with associated hypermetabolism, which may represent a complex cyst or abscess. 5. Aortic atherosclerosis. Coronary artery calcification. 6. Left renal stone. -Treatment plan include R-CHOP for 6 cycles starting 05/10/18   07/11/18 PET/CT revealed No evidence of recurrent lymphoma. 2. Left renal stone. 3. Aortic atherosclerosis. Coronary artery calcification.   S/p 6 cycles of R-CHOP completed on 08/24/18  09/22/18 PET/CT revealed No evidence of hypermetabolic active lymphoma. 2. Aortic atherosclerosis, coronary artery atherosclerosis and emphysema. 3. Left nephrolithiasis. 4. Sinus disease.  PLAN:  -Discussed pt labwork today, 06/30/19; CBC w/diff and CMP is as follows: all values are WNL  except for RBC at 3.78, Hemoglobin at 11.3, HCT at 33.8 Glucose Bld at 115, Albumin at 3.4. -LDH WNL - No LAB or clinical evidence of DLBCL recurrence/progression at this time. -Advised to make sure he is following up with his PCP about his medications and the ones he has discontinued. -Discussed following up with PCP about short term memory concerns and addressing paperwork regard POA and HCPOA. -no indication for additional workup or rx for his lymphoma at this time.  2.  Patient Active Problem List   Diagnosis Date Noted  . Diffuse large B-cell lymphoma ([place]) 03/30/2018  . Perineal mass, male 03/11/2018  . Perineal mass in male 03/08/2018  . Degeneration of lumbar intervertebral disc 10/05/2017  . History of total hip arthroplasty 10/05/2017  . Combined forms of age-related cataract of both eyes 04/12/2014  . OA (osteoarthritis) of hip 05/31/2013  . History of revision of total  replacement of left hip joint 05/31/2013  . High risk medication use 10/25/2012  . Vitreomacular adhesion 05/24/2012  . Posterior vitreous detachment 04/12/2012  . Cystoid macular edema 11/10/2011  . Subretinal neovascularization of macula 09/11/2011  . Retinal pigment epithelial detachment, bilateral 09/11/2011  . Macular degeneration of both eyes 06/09/2011  . History of thyroid cancer 04/21/2011  . Macular degeneration 01/05/2011  . CARCINOMA, THYROID GLAND, PAPILLARY 11/20/2008  . HYPOTHYROIDISM, POSTSURGICAL 11/20/2008  . Osteoarthritis 07/01/2007  . LOW BACK PAIN 07/01/2007  . Uric acid renal calculus 06/20/2007  . Essential hypertension 06/20/2007  -continue f/u with PCP for mx of other chronic medical issues.   FOLLOW UP: RTC with Dr Irene Limbo with labs in 3 months      The total time spent in the appt was 20 minutes and more than 50% was on counseling and direct patient cares.  All of the patient's questions were answered with apparent satisfaction. The patient knows to call the clinic with any problems, questions or concerns. Sullivan Lone MD [place] AAHIVMS [facility] Hematology/Oncology Physician [facility]  (Office):       [phone] (Work cell):  [phone] (Fax):           [phone]  06/29/2019 11:39 PM  I, Scot Dock, am acting as a scribe for Dr. Sullivan Lone.   .I have reviewed the above documentation for accuracy and completeness, and I agree with the above. Brunetta Genera MD

## 2019-06-30 ENCOUNTER — Other Ambulatory Visit: Payer: Self-pay

## 2019-06-30 ENCOUNTER — Inpatient Hospital Stay: Payer: Medicare PPO | Admitting: Hematology

## 2019-06-30 ENCOUNTER — Telehealth: Payer: Self-pay | Admitting: Hematology

## 2019-06-30 ENCOUNTER — Inpatient Hospital Stay: Payer: Medicare PPO | Attending: Hematology

## 2019-06-30 VITALS — BP 120/58 | HR 75 | Temp 98.7°F | Resp 17 | Ht 64.0 in | Wt 166.5 lb

## 2019-06-30 DIAGNOSIS — C8330 Diffuse large B-cell lymphoma, unspecified site: Secondary | ICD-10-CM | POA: Diagnosis not present

## 2019-06-30 DIAGNOSIS — C8335 Diffuse large B-cell lymphoma, lymph nodes of inguinal region and lower limb: Secondary | ICD-10-CM | POA: Insufficient documentation

## 2019-06-30 DIAGNOSIS — Z79899 Other long term (current) drug therapy: Secondary | ICD-10-CM | POA: Insufficient documentation

## 2019-06-30 DIAGNOSIS — E039 Hypothyroidism, unspecified: Secondary | ICD-10-CM | POA: Diagnosis not present

## 2019-06-30 DIAGNOSIS — I2510 Atherosclerotic heart disease of native coronary artery without angina pectoris: Secondary | ICD-10-CM | POA: Insufficient documentation

## 2019-06-30 DIAGNOSIS — I10 Essential (primary) hypertension: Secondary | ICD-10-CM | POA: Diagnosis not present

## 2019-06-30 DIAGNOSIS — I700 Atherosclerosis of aorta: Secondary | ICD-10-CM | POA: Diagnosis not present

## 2019-06-30 DIAGNOSIS — M1990 Unspecified osteoarthritis, unspecified site: Secondary | ICD-10-CM | POA: Insufficient documentation

## 2019-06-30 DIAGNOSIS — Z87891 Personal history of nicotine dependence: Secondary | ICD-10-CM | POA: Diagnosis not present

## 2019-06-30 DIAGNOSIS — Z888 Allergy status to other drugs, medicaments and biological substances status: Secondary | ICD-10-CM | POA: Insufficient documentation

## 2019-06-30 LAB — CMP (CANCER CENTER ONLY)
ALT: 19 U/L (ref 0–44)
AST: 17 U/L (ref 15–41)
Albumin: 3.4 g/dL — ABNORMAL LOW (ref 3.5–5.0)
Alkaline Phosphatase: 97 U/L (ref 38–126)
Anion gap: 7 (ref 5–15)
BUN: 22 mg/dL (ref 8–23)
CO2: 26 mmol/L (ref 22–32)
Calcium: 8.9 mg/dL (ref 8.9–10.3)
Chloride: 106 mmol/L (ref 98–111)
Creatinine: 0.75 mg/dL (ref 0.61–1.24)
GFR, Est AFR Am: >60 mL/min (ref >60)
GFR, Estimated: >60 mL/min (ref >60)
Glucose, Bld: 115 mg/dL — ABNORMAL HIGH (ref 70–99)
Potassium: 4.1 mmol/L (ref 3.5–5.1)
Sodium: 139 mmol/L (ref 135–145)
Total Bilirubin: 0.3 mg/dL (ref 0.3–1.2)
Total Protein: 6.6 g/dL (ref 6.5–8.1)

## 2019-06-30 LAB — CBC WITH DIFFERENTIAL/PLATELET
Abs Immature Granulocytes: 0.01 10*3/uL (ref 0.00–0.07)
Basophils Absolute: 0.1 10*3/uL (ref 0.0–0.1)
Basophils Relative: 1 %
Eosinophils Absolute: 0.1 10*3/uL (ref 0.0–0.5)
Eosinophils Relative: 3 %
HCT: 33.8 % — ABNORMAL LOW (ref 39.0–52.0)
Hemoglobin: 11.3 g/dL — ABNORMAL LOW (ref 13.0–17.0)
Immature Granulocytes: 0 %
Lymphocytes Relative: 17 %
Lymphs Abs: 0.9 10*3/uL (ref 0.7–4.0)
MCH: 29.9 pg (ref 26.0–34.0)
MCHC: 33.4 g/dL (ref 30.0–36.0)
MCV: 89.4 fL (ref 80.0–100.0)
Monocytes Absolute: 0.4 10*3/uL (ref 0.1–1.0)
Monocytes Relative: 7 %
Neutro Abs: 3.8 10*3/uL (ref 1.7–7.7)
Neutrophils Relative %: 72 %
Platelets: 209 10*3/uL (ref 150–400)
RBC: 3.78 MIL/uL — ABNORMAL LOW (ref 4.22–5.81)
RDW: 13.5 % (ref 11.5–15.5)
WBC: 5.2 10*3/uL (ref 4.0–10.5)
nRBC: 0 % (ref 0.0–0.2)

## 2019-06-30 LAB — LACTATE DEHYDROGENASE: LDH: 133 U/L (ref 98–192)

## 2019-06-30 NOTE — Telephone Encounter (Signed)
Scheduled appt per 10/23 los.  Left a vm of the appt date and time.

## 2019-07-06 DIAGNOSIS — H353231 Exudative age-related macular degeneration, bilateral, with active choroidal neovascularization: Secondary | ICD-10-CM | POA: Diagnosis not present

## 2019-07-18 ENCOUNTER — Other Ambulatory Visit: Payer: Self-pay | Admitting: Family Medicine

## 2019-07-18 DIAGNOSIS — F339 Major depressive disorder, recurrent, unspecified: Secondary | ICD-10-CM

## 2019-07-18 NOTE — Telephone Encounter (Signed)
Ok to sent 90 days supply with 2 refills per pharmacy request

## 2019-07-19 DIAGNOSIS — H2511 Age-related nuclear cataract, right eye: Secondary | ICD-10-CM | POA: Diagnosis not present

## 2019-07-19 DIAGNOSIS — H25811 Combined forms of age-related cataract, right eye: Secondary | ICD-10-CM | POA: Diagnosis not present

## 2019-08-15 ENCOUNTER — Other Ambulatory Visit: Payer: Self-pay

## 2019-08-16 ENCOUNTER — Encounter: Payer: Self-pay | Admitting: Family Medicine

## 2019-08-16 ENCOUNTER — Ambulatory Visit (INDEPENDENT_AMBULATORY_CARE_PROVIDER_SITE_OTHER): Payer: Medicare PPO | Admitting: Family Medicine

## 2019-08-16 DIAGNOSIS — Z87891 Personal history of nicotine dependence: Secondary | ICD-10-CM | POA: Diagnosis not present

## 2019-08-16 DIAGNOSIS — F320 Major depressive disorder, single episode, mild: Secondary | ICD-10-CM

## 2019-08-16 DIAGNOSIS — H3530 Unspecified macular degeneration: Secondary | ICD-10-CM

## 2019-08-16 DIAGNOSIS — I10 Essential (primary) hypertension: Secondary | ICD-10-CM | POA: Diagnosis not present

## 2019-08-16 DIAGNOSIS — C8330 Diffuse large B-cell lymphoma, unspecified site: Secondary | ICD-10-CM

## 2019-08-16 DIAGNOSIS — E890 Postprocedural hypothyroidism: Secondary | ICD-10-CM | POA: Diagnosis not present

## 2019-08-16 DIAGNOSIS — Z96642 Presence of left artificial hip joint: Secondary | ICD-10-CM

## 2019-08-16 DIAGNOSIS — N200 Calculus of kidney: Secondary | ICD-10-CM | POA: Diagnosis not present

## 2019-08-16 MED ORDER — ALLOPURINOL 300 MG PO TABS: ORAL_TABLET | ORAL | 3 refills | Status: DC

## 2019-08-16 MED ORDER — LEVOTHYROXINE SODIUM 125 MCG PO TABS: 125.0000 ug | ORAL_TABLET | Freq: Every day | ORAL | 3 refills | Status: DC

## 2019-08-16 MED ORDER — BENAZEPRIL HCL 20 MG PO TABS: ORAL_TABLET | ORAL | 3 refills | Status: DC

## 2019-08-16 NOTE — Assessment & Plan Note (Signed)
S: Patient currently taking Benazeprill 20 mg daily. He does check blood pressures at home randomly. He denies any high readings. No chest pain, dizziness, or headaches.  A/P:  Stable. Continue current medications.

## 2019-08-16 NOTE — Assessment & Plan Note (Signed)
Started as perineal growth/mass-  removed by Dr. Harlow Asa July 2019. Completed chemotherapy- follows with Dr. Irene Limbo.

## 2019-08-16 NOTE — Assessment & Plan Note (Signed)
Both eyes- worse on left with peripheral vision only.  Previously followed by Dr. Zadie Rhine has been treated with  laser therapy.  Patient currently followed by [facility] ophthalmology.  Reports being on Avastin in the past.

## 2019-08-16 NOTE — Assessment & Plan Note (Signed)
S: Unfortunately had rash on Zoloft.  At first visit August 16, 2019 did not want to retrial therapy-medication or counseling Depression screen [facility] 2/9 08/16/2019  Decreased Interest 3  Down, Depressed, Hopeless 1  PHQ - 2 Score 4  Altered sleeping 1  Tired, decreased energy 0  Change in appetite 2  Feeling bad or failure about yourself  2  Trouble concentrating 0  Moving slowly or fidgety/restless 0  Suicidal thoughts 0  PHQ-9 Score 9  Difficult doing work/chores Not difficult at all  A/P: Poor control-mild case of depression with PHQ-9 between 5 and 10.  Patient is suffering from loneliness particular with COVID-19.  He has no thoughts of self-harm.  Encouraged him strongly to consider counseling-not interested at this time but he did at least accept information on this.  With recent rash on Zoloft does not want to retrial medication at this time-we will discuss again at follow-up

## 2019-08-16 NOTE — Assessment & Plan Note (Signed)
S: compliant On thyroid medication- levothyroxine 125 mcg. After thyroid cancer removal around 2010  From 04/21/2011 note from Dr. Harlow Asa "Patient is a 85 year old male who underwent total thyroidectomy in February of 2010. 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 Patient was subsequently treated with radioactive iodine. Over the past 2-1/2 years he has been disease-free. He presents today for his final surgical followup... IMPRESSION: Follicular variant of papillary thyroid carcinoma, T1b, NX, no evidence of recurrent disease   PLAN: Patient has no evidence of recurrent disease at this point in time. He continued close followup with his primary physician. Patient should have a TSH level done at regular intervals. He should have a thyroglobulin determination done at least yearly. He will followup with Dr. Renato Shin in endocrinology as needed. He will return to see me in this office as needed." Lab Results  Component Value Date   TSH 0.47 05/04/2019   A/P:Thyroid removed- would check TSH at follow-up.  I do not see that patient has seen Dr. Loanne Drilling in years-we will also get thyroglobulin level per prior note from Dr. Harlow Asa

## 2019-08-16 NOTE — Patient Instructions (Signed)
Ear canal on right appears slightly dry on outer portion- could try light amount of vaseline to see if that helps- use twice a day. If you have worsening ear pain or failure to improve within the next month- let me refer you to an ear/nose/throat doctor  Please call [phone] to schedule a visit with  behavioral health. If you would like to consider a different antidepressant we are happy to do that. If you have any thoughts of self harm please call 911 or [place] immediately.  -Trey Paula is an excellent counselor who is based out of our clinic  No other changes today. It was a pleasure to meet you. I look forward to continuing to work with you.   Recommended follow up: Return in about 6 months (around 02/14/2020) for physical or sooner if needed.

## 2019-08-16 NOTE — Assessment & Plan Note (Signed)
S: Patient is compliant with allopurinol.No further stone development on allopurinol.  He does report he still has 1 stone in place but fortunately has not bothered him A/P: Hopefully stable and allopurinol preventing development of subsequent stones-continue allopurinol 300 mg

## 2019-08-16 NOTE — Progress Notes (Signed)
Phone: [phone]   Subjective:  Patient presents today to establish care.  Prior patient of Dr. Volanda Napoleon.  Chief Complaint  Patient presents with   Establish Care    TOC    See problem oriented charting  The following were reviewed and entered/updated in epic: Past Medical History:  Diagnosis Date   Arthritis    OA AND PAIN LEFT HIP AND OTHER JOINT PAINS   Cancer ([place])    THYROID CANCER - HAD THYROID REMOVED   History of kidney stones    History of shingles    RESOLVED   Hypertension    Hypothyroidism    Low back pain    Macular degeneration of both eyes    RBBB (right bundle branch block with left anterior fascicular block)    Seasonal allergies    Thyroid disease    Uric acid renal calculus 06/20/2007   Qualifier: Diagnosis of  By: Rogue Bussing CMA, Jacqualynn     Patient Active Problem List   Diagnosis Date Noted   Diffuse large B-cell lymphoma ([place]) 03/30/2018    Priority: High   History of total hip arthroplasty 10/05/2017    Priority: Medium   History of thyroid cancer 04/21/2011    Priority: Medium   Macular degeneration 01/05/2011    Priority: Medium   HYPOTHYROIDISM, POSTSURGICAL 11/20/2008    Priority: Medium   Osteoarthritis 07/01/2007    Priority: Medium   Uric acid renal calculus 06/20/2007    Priority: Medium   Essential hypertension 06/20/2007    Priority: Medium   Former smoker 08/16/2019    Priority: Low   Degeneration of lumbar intervertebral disc 10/05/2017    Priority: Low   OA (osteoarthritis) of hip 05/31/2013    Priority: Low   Depression, major, single episode, mild ([place]) 08/16/2019   Past Surgical History:  Procedure Laterality Date   APPENDECTOMY  1948   CATARACT EXTRACTION Right 08/27/2019   COLONOSCOPY     Elbow Radical Reduction  1973   IR IMAGING GUIDED PORT INSERTION  05/05/2018   IR REMOVAL TUN ACCESS W/ PORT W/O FL MOD SED  10/18/2018   KIDNEY STONE SURGERY  1971, 1976   MASS EXCISION N/A  03/11/2018   Procedure: EXCISION MASS OF PERINEUM;  Surgeon: Armandina Gemma, MD;  Location: [facility];  Service: General;  Laterality: N/A;   REMOVAL OF FIRST RIB   [place] BETWEEN 1 ST RIB AND COLLARBONE   Rib removed, [place]  2009   THYROIDECTOMY  2010   TOTAL HIP ARTHROPLASTY Left 05/31/2013   Procedure: LEFT TOTAL HIP ARTHROPLASTY ANTERIOR APPROACH;  Surgeon: Gearlean Alf, MD;  Location: [facility];  Service: Orthopedics;  Laterality: Left;    Family History  Problem Relation Age of Onset   Heart disease Mother        passed in 58s   Diabetes Mother    Alcohol abuse Father        passed from this   Parkinson's disease Sister    Diabetes Brother    Dementia Sister    Cancer Brother        sounds like bone cancer- non healing knee injury    Medications- reviewed and updated Current Outpatient Medications  Medication Sig Dispense Refill   acetaminophen (TYLENOL) 325 MG tablet Take 325-650 mg by mouth every 6 (six) hours as needed for headache.      Aflibercept (EYLEA) 2 MG/0.05ML SOLN Inject 1 Dose  into the eye every 8 (eight) weeks.      allopurinol (ZYLOPRIM) 300 MG tablet TAKE 1 TABLET EVERY DAY 90 tablet 3   benazepril (LOTENSIN) 20 MG tablet TAKE 1 TABLET EVERY DAY 90 tablet 3   cetirizine (ZYRTEC) 10 MG tablet Take 10 mg by mouth daily as needed for allergies.     fluticasone (FLONASE) 50 MCG/ACT nasal spray Place 2 sprays into both nostrils daily. 16 g 0   Glucosamine-Chondroitin (OSTEO BI-FLEX REGULAR STRENGTH PO) Take 1 tablet by mouth 2 (two) times daily.     levothyroxine (SYNTHROID) 125 MCG tablet Take 1 tablet (125 mcg total) by mouth daily. 90 tablet 3   Multiple Vitamin (MULTIVITAMIN) tablet Take 1 tablet by mouth daily.     Multiple Vitamins-Minerals (PRESERVISION AREDS 2 PO) Take 1 tablet by mouth 2 (two) times daily.     Polyethyl Glycol-Propyl Glycol (SYSTANE OP) Place 1 drop into both eyes daily  as needed (irritation).     sodium chloride (OCEAN) 0.65 % SOLN nasal spray Place 1 spray into both nostrils as needed for congestion.     No current facility-administered medications for this visit.     Allergies-reviewed and updated Allergies  Allergen Reactions   Penicillins Swelling, Palpitations and Other (See Comments)    Swelling of joints. Has patient had a PCN reaction causing immediate rash, facial/tongue/throat swelling, SOB or lightheadedness with hypotension: Yes Has patient had a PCN reaction causing severe rash involving mucus membranes or skin necrosis: No Has patient had a PCN reaction that required hospitalization: Yes Has patient had a PCN reaction occurring within the last 10 years: No If all of the above answers are "NO", then may proceed with Cephalosporin use.    Shingrix [Zoster Vac Recomb Adjuvanted]    Zoloft [Sertraline Hcl] Rash    Social History   Social History Narrative   Lives alone and 1 cat. Divorced from mongomous long term partner and eventual husband. No children from prior relationships.       Retired Geologist, engineering: socializing, used to play tennis    ROS--Full ROS was completed Review of Systems  Constitutional: Negative.   HENT: Positive for hearing loss.        Wears hearing aid having some right ear pain   Eyes: Positive for blurred vision.       Under care for vision loss   Respiratory: Negative.   Cardiovascular: Negative.   Gastrointestinal: Negative.   Genitourinary: Negative.   Musculoskeletal: Negative.   Skin: Negative.   Neurological: Negative.   Endo/Heme/Allergies: Negative.   Psychiatric/Behavioral: Negative.   other than loneliness and depressed mood  Objective  Objective:  BP 120/60    Pulse 73    Temp 98.7 F (37.1 C) (Temporal)    Ht 5\' 4"  (1.626 m)    Wt 172 lb 12.8 oz (78.4 kg)    SpO2 97%    BMI 29.66 kg/m  Gen: NAD, resting comfortably HEENT: Mask not removed due to covid 19. TM  normal- right ear canal mildly dry and slightly red- slightly tender to touch in this area. Bridge of nose normal. Eyelids normal.  Neck: no thyromegaly or cervical lymphadenopathy  CV: RRR no murmurs rubs or gallops Lungs: CTAB no crackles, wheeze, rhonchi Abdomen: soft/nontender/nondistended/normal bowel sounds. Ext: 1+ edema Skin: warm, dry    Assessment and Plan:   Transition of care from Dr. Volanda Napoleon within [facility]  # Right ear pain S:Started around 5  days ago. Patient states ear is sore on inside. He denies any changes in hearing, discharge. Only tender with touch.  A/P: Appears to have mild irritation and some dryness of the outer ear canal-recommend trial light amount of Vaseline twice a day.  If no improvement in the next 2 to 4 weeks could refer to ENT for their second opinion.  #Erroneously listed diagnosis-patient had diabetes previously listed.  This was also included in his health maintenance items.  I deleted this diagnosis   No results found for: HGBA1C Does not appear to ever have had an A1c or significantly elevated blood sugar -May have had some elevated fasting blood sugars-could check A1c in the future  Diffuse large B-cell lymphoma ([place]) Started as perineal growth/mass-  removed by Dr. Harlow Asa July 2019. Completed chemotherapy- follows with Dr. Irene Limbo.    Uric acid renal calculus S: Patient is compliant with allopurinol.No further stone development on allopurinol.  He does report he still has 1 stone in place but fortunately has not bothered him A/P: Hopefully stable and allopurinol preventing development of subsequent stones-continue allopurinol 300 mg  Macular degeneration Both eyes- worse on left with peripheral vision only.  Previously followed by Dr. Zadie Rhine has been treated with  laser therapy.  Patient currently followed by [facility] ophthalmology.  Reports being on Avastin in the past.     HYPOTHYROIDISM, POSTSURGICAL S: compliant On thyroid medication-  levothyroxine 125 mcg. After thyroid cancer removal around 2010  From 04/21/2011 note from Dr. Harlow Asa "Patient is a 85 year old male who underwent total thyroidectomy in February of 2010. 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 Patient was subsequently treated with radioactive iodine. Over the past 2-1/2 years he has been disease-free. He presents today for his final surgical followup... IMPRESSION: Follicular variant of papillary thyroid carcinoma, T1b, NX, no evidence of recurrent disease   PLAN: Patient has no evidence of recurrent disease at this point in time. He continued close followup with his primary physician. Patient should have a TSH level done at regular intervals. He should have a thyroglobulin determination done at least yearly. He will followup with Dr. Renato Shin in endocrinology as needed. He will return to see me in this office as needed." Lab Results  Component Value Date   TSH 0.47 05/04/2019   A/P:Thyroid removed- would check TSH at follow-up.  I do not see that patient has seen Dr. Loanne Drilling in years-we will also get thyroglobulin level per prior note from Dr. Harlow Asa  Essential hypertension S: Patient currently taking Benazeprill 20 mg daily. He does check blood pressures at home randomly. He denies any high readings. No chest pain, dizziness, or headaches.  A/P:  Stable. Continue current medications.     Depression, major, single episode, mild ([place]) S: Unfortunately had rash on Zoloft.  At first visit August 16, 2019 did not want to retrial therapy-medication or counseling Depression screen [facility] 2/9 08/16/2019  Decreased Interest 3  Down, Depressed, Hopeless 1  PHQ - 2 Score 4  Altered sleeping 1  Tired, decreased energy 0  Change in appetite 2  Feeling bad or failure about yourself  2  Trouble concentrating 0  Moving slowly or fidgety/restless 0  Suicidal thoughts 0  PHQ-9 Score 9   Difficult doing work/chores Not difficult at all  A/P: Poor control-mild case of depression with PHQ-9 between 5 and 10.  Patient is suffering from loneliness particular with COVID-19.  He has  no thoughts of self-harm.  Encouraged him strongly to consider counseling-not interested at this time but he did at least accept information on this.  With recent rash on Zoloft does not want to retrial medication at this time-we will discuss again at follow-up    Recommended follow up: Return in about 6 months (around 02/14/2020) for physical or sooner if needed. Future Appointments  Date Time Provider [place]  10/02/2019 11:00 AM [facility] None  10/02/2019 11:40 AM Brunetta Genera, MD [facility] [place] None  02/19/2020 11:00 AM Marin Olp, MD [facility]   Sending in prescription so they will be under my name Meds ordered this encounter  Medications   levothyroxine (SYNTHROID) 125 MCG tablet    Sig: Take 1 tablet (125 mcg total) by mouth daily.    Dispense:  90 tablet    Refill:  3   benazepril (LOTENSIN) 20 MG tablet    Sig: TAKE 1 TABLET EVERY DAY    Dispense:  90 tablet    Refill:  3   allopurinol (ZYLOPRIM) 300 MG tablet    Sig: TAKE 1 TABLET EVERY DAY    Dispense:  90 tablet    Refill:  3   Time Stamp The duration of face-to-face time during this visit was greater than 40 minutes (11:15-11:56 AM). Greater than 50% of this time was spent in counseling, explanation of diagnosis, planning of further management, and/or coordination of care including counseling about depression, counseling about concerns about prior medical illnesses, counseling about right ear and follow-up indications, strongly encouraging counseling visit.    Return precautions advised. Garret Reddish, MD

## 2019-09-17 ENCOUNTER — Encounter: Payer: Self-pay | Admitting: Hematology

## 2019-09-19 ENCOUNTER — Encounter: Payer: Self-pay | Admitting: *Deleted

## 2019-09-19 ENCOUNTER — Telehealth: Payer: Self-pay | Admitting: *Deleted

## 2019-09-19 NOTE — Telephone Encounter (Signed)
Patient requested letter be sent to [facility] from Dr. Irene Limbo to give clearance for teeth cleaning and dental x-rays. [facility] [phone]. They requested letter faxed to [phone]. Letter prepared/signed by MD/faxed to # given/fax confirmation received. Patient notified and verbalized understanding.

## 2019-09-29 DIAGNOSIS — Z961 Presence of intraocular lens: Secondary | ICD-10-CM | POA: Diagnosis not present

## 2019-10-02 ENCOUNTER — Other Ambulatory Visit: Payer: Self-pay

## 2019-10-02 ENCOUNTER — Inpatient Hospital Stay: Payer: Medicare PPO | Admitting: Hematology

## 2019-10-02 ENCOUNTER — Inpatient Hospital Stay: Payer: Medicare PPO | Attending: Hematology

## 2019-10-02 VITALS — BP 128/68 | HR 79 | Temp 98.7°F | Resp 18 | Ht 64.0 in | Wt 173.4 lb

## 2019-10-02 DIAGNOSIS — Z79899 Other long term (current) drug therapy: Secondary | ICD-10-CM | POA: Insufficient documentation

## 2019-10-02 DIAGNOSIS — Z85850 Personal history of malignant neoplasm of thyroid: Secondary | ICD-10-CM | POA: Insufficient documentation

## 2019-10-02 DIAGNOSIS — M1990 Unspecified osteoarthritis, unspecified site: Secondary | ICD-10-CM | POA: Diagnosis not present

## 2019-10-02 DIAGNOSIS — C8330 Diffuse large B-cell lymphoma, unspecified site: Secondary | ICD-10-CM

## 2019-10-02 DIAGNOSIS — E890 Postprocedural hypothyroidism: Secondary | ICD-10-CM | POA: Insufficient documentation

## 2019-10-02 DIAGNOSIS — I10 Essential (primary) hypertension: Secondary | ICD-10-CM | POA: Insufficient documentation

## 2019-10-02 LAB — CBC WITH DIFFERENTIAL/PLATELET
Abs Immature Granulocytes: 0.01 10*3/uL (ref 0.00–0.07)
Basophils Absolute: 0 10*3/uL (ref 0.0–0.1)
Basophils Relative: 1 %
Eosinophils Absolute: 0 10*3/uL (ref 0.0–0.5)
Eosinophils Relative: 1 %
HCT: 35.3 % — ABNORMAL LOW (ref 39.0–52.0)
Hemoglobin: 11.7 g/dL — ABNORMAL LOW (ref 13.0–17.0)
Immature Granulocytes: 0 %
Lymphocytes Relative: 21 %
Lymphs Abs: 1 10*3/uL (ref 0.7–4.0)
MCH: 30.3 pg (ref 26.0–34.0)
MCHC: 33.1 g/dL (ref 30.0–36.0)
MCV: 91.5 fL (ref 80.0–100.0)
Monocytes Absolute: 0.4 10*3/uL (ref 0.1–1.0)
Monocytes Relative: 9 %
Neutro Abs: 3.3 10*3/uL (ref 1.7–7.7)
Neutrophils Relative %: 68 %
Platelets: 186 10*3/uL (ref 150–400)
RBC: 3.86 MIL/uL — ABNORMAL LOW (ref 4.22–5.81)
RDW: 13.3 % (ref 11.5–15.5)
WBC: 4.8 10*3/uL (ref 4.0–10.5)
nRBC: 0 % (ref 0.0–0.2)

## 2019-10-02 LAB — LACTATE DEHYDROGENASE: LDH: 146 U/L (ref 98–192)

## 2019-10-02 LAB — CMP (CANCER CENTER ONLY)
ALT: 18 U/L (ref 0–44)
AST: 18 U/L (ref 15–41)
Albumin: 3.6 g/dL (ref 3.5–5.0)
Alkaline Phosphatase: 94 U/L (ref 38–126)
Anion gap: 7 (ref 5–15)
BUN: 16 mg/dL (ref 8–23)
CO2: 25 mmol/L (ref 22–32)
Calcium: 8.8 mg/dL — ABNORMAL LOW (ref 8.9–10.3)
Chloride: 108 mmol/L (ref 98–111)
Creatinine: 0.75 mg/dL (ref 0.61–1.24)
GFR, Est AFR Am: >60 mL/min (ref >60)
GFR, Estimated: >60 mL/min (ref >60)
Glucose, Bld: 110 mg/dL — ABNORMAL HIGH (ref 70–99)
Potassium: 4.4 mmol/L (ref 3.5–5.1)
Sodium: 140 mmol/L (ref 135–145)
Total Bilirubin: 0.4 mg/dL (ref 0.3–1.2)
Total Protein: 6.6 g/dL (ref 6.5–8.1)

## 2019-10-02 NOTE — Progress Notes (Signed)
HEMATOLOGY/ONCOLOGY CLINIC NOTE  Date of Service: 10/02/2019  Patient Care Team: Marin Olp, MD as PCP - General (Family Medicine) Brunetta Genera, MD as Consulting Physician (Hematology) Justice Britain, MD as Consulting Physician (Orthopedic Surgery) Armandina Gemma, MD as Consulting Physician (General Surgery) Zadie Rhine Clent Demark, MD as Consulting Physician (Ophthalmology) Suella Broad, MD as Consulting Physician (Physical Medicine and Rehabilitation) Zebedee Iba., MD as Referring Physician (Ophthalmology) Gaynelle Arabian, MD as Consulting Physician (Orthopedic Surgery)  CHIEF COMPLAINTS/PURPOSE OF CONSULTATION:  Diffuse Large B-Cell Lymphoma  HISTORY OF PRESENTING ILLNESS:   Jay Webster is a wonderful 86 y.o. male who has been referred to [place] by surgeon Dr. Armandina Gemma for evaluation and management of Diffuse Large B-Cell Lymphoma. The pt reports that he is doing well overall.   Of note prior to the patient's visit today, pt has had Perineum biopsy completed on 03/11/18 with results revealing Diffuse Large B-Cell Lymphoma. The pt had surgery to remove his irregularly shaped mass in the left anterior portion of the perineum with Dr. Armandina Gemma.  The pt reports first noticing the mass in his left buttock about 2 months ago, after a visit with Dr. Trena Platt. The pt decided to consult his surgeon Dr. Armandina Gemma whom he previously had a thyroidectomy with. The pt notes that the spot became as large as a golf ball and did not breach the skin. The pt notes that it wasn't terribly uncomfortable to sit on. He denies feeling any differently recently than in the past  6 months to a year.   The pt notes that he has been having some continued discharge and is using Depends. He is changing his gauze twice each day.   He denies any recent medical problems. He continues to function independently at home and lives alone, as his partner of 30+ years died in the past year. He notes  that he does not have any family members or friends who he would want to include in his care. He denies any difficulties functioning day to day and denies concerns for his memory.   He has been on thyroid replacement for the last 10 years following a thyroidectomy after thyroid cancer, and was treated with one dose of radioactive iodine.   The pt also notes a uric acid kidney stone history which hasn't been a problem in many years.    Most recent lab results (02/28/18) of CBC  is as follows: all values are WNL except for HCT at 38.3.  On review of systems, pt reports left buttock surgical wound, ganglion cyst on right wrist, and denies fevers, chills, night sweats, unexpected weight loss, new fatigue, pain along the spine, leg swelling, noticing any new lumps or bumps, and any other symptoms.   On PMHx the pt denies heart problems, strokes, abdominal problems, lung problems.  On Social Hx the pt reports that he quit smoking in 1990, and used to smoke 1-2 packs of cigarettes each day before this. He denies excess alcohol being a problem, and consumes one glass of wine rarely. 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  On Family Hx the pt reports brother died of sarcoma, different brother with Type I DM, and denies other cancer or blood disorders.  Interval History:    Jay Webster returns today for management and evaluation of his Diffuse Large B-Cell Lymphoma. The patient's last visit with [place] was on 06/30/2019. The pt reports that  he is doing well overall.  The pt reports that he has tried to receive the COVID19 vaccine but was denied. 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   He has continued to eat well, but is having trouble motivating himself to remain active. He does have a path near his home that he can walk on. Pt notes that his memory has continued to decline. He is not ready to move into an assisted living facility at this time. Pt's friend that  was previously assisting him moved to [place].   Pt had a cataract removed at [facility] in the interim. He also had to put down his cat and being alone has induced some stress.   Lab results today (10/02/19) of CBC w/diff and CMP is as follows: all values are WNL except for RBC at 3.86, Hgb at 11.7, HCT at 35.3, Glucose at 110, Calcium at 8.8. 10/02/2019 LDH at 146  On review of systems, pt reports memory issues, eating well, stress and denies new lumps/bumps, fevers, chills, night sweats, unexpected weight loss, new back pain, abdominal pain, leg swelling and any other symptoms.   MEDICAL HISTORY:   Past Medical History:  Diagnosis Date  . Arthritis    OA AND PAIN LEFT HIP AND OTHER JOINT PAINS  . Cancer (HCC)    THYROID CANCER - HAD THYROID REMOVED  . History of kidney stones   . History of shingles    RESOLVED  . Hypertension   . Hypothyroidism   . Low back pain   . Macular degeneration of both eyes   . RBBB (right bundle branch block with left anterior fascicular block)   . Seasonal allergies   . Thyroid disease   . Uric acid renal calculus 06/20/2007   Qualifier: Diagnosis of  By: Rogue Bussing CMA, Maryann Alar      SURGICAL HISTORY: Past Surgical History:  Procedure Laterality Date  . APPENDECTOMY  1948  . CATARACT EXTRACTION Right 08/27/2019  . COLONOSCOPY    . Elbow Radical Reduction  1973  . IR IMAGING GUIDED PORT INSERTION  05/05/2018  . IR REMOVAL TUN ACCESS W/ PORT W/O FL MOD SED  10/18/2018  . [place]  . MASS EXCISION N/A 03/11/2018   Procedure: EXCISION MASS OF PERINEUM;  Surgeon: Armandina Gemma, MD;  Location: [facility];  Service: General;  Laterality: N/A;  . REMOVAL OF FIRST RIB   NOV 1994   FOR COMPRESSED VEIN BETWEEN 1 ST RIB AND COLLARBONE  . Rib removed, 1st  1994  . ROTATOR CUFF REPAIR  2009  . THYROIDECTOMY  2010  . TOTAL HIP ARTHROPLASTY Left 05/31/2013   Procedure: LEFT TOTAL HIP ARTHROPLASTY ANTERIOR APPROACH;   Surgeon: Gearlean Alf, MD;  Location: [facility];  Service: Orthopedics;  Laterality: Left;    SOCIAL HISTORY: Social History   Socioeconomic History  . Marital status: Widowed    Spouse name: Not on file  . Number of children: Not on file  . Years of education: Not on file  . Highest education level: Not on file  Occupational History  . Occupation: retired  Tobacco Use  . Smoking status: Former Smoker    Types: Cigarettes    Quit date: 02/22/1979    Years since quitting: 40.6  . Smokeless tobacco: Never Used  Substance and Sexual Activity  . Alcohol use: Yes    Alcohol/week: 0.0 - 1.0 standard drinks  . Drug use: No  . Sexual activity: Not Currently  Other  Topics Concern  . Not on file  Social History Narrative   Lives alone and 1 cat. Divorced from mongomous long term partner and eventual husband. No children from prior relationships.       Retired Geologist, engineering: socializing, used to play tennis   Social Determinants of Radio broadcast assistant Strain:   . Difficulty of Paying Living Expenses: Not on file  Food Insecurity:   . Worried About Charity fundraiser in the Last Year: Not on file  . Ran Out of Food in the Last Year: Not on file  Transportation Needs:   . Lack of Transportation (Medical): Not on file  . Lack of Transportation (Non-Medical): Not on file  Physical Activity:   . Days of Exercise per Week: Not on file  . Minutes of Exercise per Session: Not on file  Stress:   . Feeling of Stress : Not on file  Social Connections:   . Frequency of Communication with Friends and Family: Not on file  . Frequency of Social Gatherings with Friends and Family: Not on file  . Attends Religious Services: Not on file  . Active Member of Clubs or Organizations: Not on file  . Attends Archivist Meetings: Not on file  . Marital Status: Not on file  Intimate Partner Violence:   . Fear of Current or Ex-Partner: Not on file  .  Emotionally Abused: Not on file  . Physically Abused: Not on file  . Sexually Abused: Not on file    FAMILY HISTORY: Family History  Problem Relation Age of Onset  . Heart disease Mother        passed in 38s  . Diabetes Mother   . Alcohol abuse Father        passed from this  . Parkinson's disease Sister   . Diabetes Brother   . Dementia Sister   . Cancer Brother        sounds like bone cancer- non healing knee injury    ALLERGIES:  is allergic to penicillins; shingrix [zoster vac recomb adjuvanted]; and zoloft [sertraline hcl].  MEDICATIONS:  Current Outpatient Medications  Medication Sig Dispense Refill  . acetaminophen (TYLENOL) 325 MG tablet Take 325-650 mg by mouth every 6 (six) hours as needed for headache.     . Aflibercept (EYLEA) 2 MG/0.05ML SOLN Inject 1 Dose into the eye every 8 (eight) weeks.     Marland Kitchen allopurinol (ZYLOPRIM) 300 MG tablet TAKE 1 TABLET EVERY DAY 90 tablet 3  . benazepril (LOTENSIN) 20 MG tablet TAKE 1 TABLET EVERY DAY 90 tablet 3  . cetirizine (ZYRTEC) 10 MG tablet Take 10 mg by mouth daily as needed for allergies.    . fluticasone (FLONASE) 50 MCG/ACT nasal spray Place 2 sprays into both nostrils daily. 16 g 0  . Glucosamine-Chondroitin (OSTEO BI-FLEX REGULAR STRENGTH PO) Take 1 tablet by mouth 2 (two) times daily.    Marland Kitchen levothyroxine (SYNTHROID) 125 MCG tablet Take 1 tablet (125 mcg total) by mouth daily. 90 tablet 3  . Multiple Vitamin (MULTIVITAMIN) tablet Take 1 tablet by mouth daily.    . Multiple Vitamins-Minerals (PRESERVISION AREDS 2 PO) Take 1 tablet by mouth 2 (two) times daily.    Vladimir Faster Glycol-Propyl Glycol (SYSTANE OP) Place 1 drop into both eyes daily as needed (irritation).    . sodium chloride (OCEAN) 0.65 % SOLN nasal spray Place 1 spray into both nostrils as needed for congestion.  No current facility-administered medications for this visit.    REVIEW OF SYSTEMS:   A 10+ POINT REVIEW OF SYSTEMS WAS OBTAINED including  neurology, dermatology, psychiatry, cardiac, respiratory, lymph, extremities, GI, GU, Musculoskeletal, constitutional, breasts, reproductive, HEENT.  All pertinent positives are noted in the HPI.  All others are negative.   PHYSICAL EXAMINATION: ECOG FS:2 - Symptomatic, <50% confined to bed  Vitals:   10/02/19 1136  BP: 128/68  Pulse: 79  Resp: 18  Temp: 98.7 F (37.1 C)  SpO2: 100%   Wt Readings from Last 3 Encounters:  10/02/19 173 lb 6.4 oz (78.7 kg)  08/16/19 172 lb 12.8 oz (78.4 kg)  06/30/19 166 lb 8 oz (75.5 kg)   Body mass index is 29.76 kg/m.    GENERAL:alert, in no acute distress and comfortable SKIN: no acute rashes, no significant lesions EYES: conjunctiva are pink and non-injected, sclera anicteric OROPHARYNX: MMM, no exudates, no oropharyngeal erythema or ulceration NECK: supple, no JVD LYMPH:  no palpable lymphadenopathy in the cervical, axillary or inguinal regions LUNGS: clear to auscultation b/l with normal respiratory effort HEART: regular rate & rhythm ABDOMEN:  normoactive bowel sounds , non tender, not distended. No palpable hepatosplenomegaly.  Extremity: no pedal edema PSYCH: alert & oriented x 3 with fluent speech NEURO: no focal motor/sensory deficits  LABORATORY DATA:  I have reviewed the data as listed  . CBC Latest Ref Rng & Units 10/02/2019 06/30/2019 05/04/2019  WBC 4.0 - 10.5 K/uL 4.8 5.2 5.4  Hemoglobin 13.0 - 17.0 g/dL 11.7(L) 11.3(L) 12.1(L)  Hematocrit 39.0 - 52.0 % 35.3(L) 33.8(L) 35.9(L)  Platelets 150 - 400 K/uL 186 209 207.0   . CBC    Component Value Date/Time   WBC 4.8 10/02/2019 1053   RBC 3.86 (L) 10/02/2019 1053   HGB 11.7 (L) 10/02/2019 1053   HGB 10.4 (L) 08/02/2018 0828   HCT 35.3 (L) 10/02/2019 1053   PLT 186 10/02/2019 1053   PLT 260 08/02/2018 0828   MCV 91.5 10/02/2019 1053   MCH 30.3 10/02/2019 1053   MCHC 33.1 10/02/2019 1053   RDW 13.3 10/02/2019 1053   LYMPHSABS 1.0 10/02/2019 1053   MONOABS 0.4  10/02/2019 1053   EOSABS 0.0 10/02/2019 1053   BASOSABS 0.0 10/02/2019 1053    CMP Latest Ref Rng & Units 10/02/2019 06/30/2019 05/04/2019  Glucose 70 - 99 mg/dL 110(H) 115(H) 95  BUN 8 - 23 mg/dL 16 22 16   Creatinine 0.61 - 1.24 mg/dL 0.75 0.75 0.71  Sodium 135 - 145 mmol/L 140 139 138  Potassium 3.5 - 5.1 mmol/L 4.4 4.1 4.0  Chloride 98 - 111 mmol/L 108 106 102  CO2 22 - 32 mmol/L 25 26 29   Calcium 8.9 - 10.3 mg/dL 8.8(L) 8.9 9.1  Total Protein 6.5 - 8.1 g/dL 6.6 6.6 -  Total Bilirubin 0.3 - 1.2 mg/dL 0.4 0.3 -  Alkaline Phos 38 - 126 U/L 94 97 -  AST 15 - 41 U/L 18 17 -  ALT 0 - 44 U/L 18 19 -   . Lab Results  Component Value Date   LDH 146 10/02/2019   Component     Latest Ref Rng & Units 03/24/2018  LDH     98 - 192 U/L 185  HCV Ab     0.0 - 0.9 s/co ratio 0.2  Hep B Core Ab, Tot     Negative Negative  Hepatitis B Surface Ag     Negative Negative  HIV Screen 4th Generation wRfx  Non Reactive Non Reactive    03/11/18 Perineum Bx:    RADIOGRAPHIC STUDIES: I have personally reviewed the radiological images as listed and agreed with the findings in the report. No results found.  ASSESSMENT & PLAN:   86 y.o. male with  1. Diffuse Large B-Cell Lymphoma -newly diagnosed presenting as a mass in the left medial gluteal region/perineum s/p resection. PLAN -03/11/18 Surgical bx which revealed Diffuse Large B-Cell Non-Hodgkin's Lymphoma  -04/13/18 ECHO revealed LV EF of 60-65%  -No Hep B, Hep C or HIV from 03/24/18 labs -04/18/18 PET/CT which revealed Mildly hypermetabolic small to borderline enlarged abdominal and pelvic retroperitoneal lymph nodes. Probable mild hypermetabolism within subcentimeter axillary lymph nodes, with misregistration on fused images. 2. Scattered lucent lesions in the iliac wings and right fifth rib. Continued attention on follow-up exams is warranted. 3. Scattered small pulmonary nodules are nonspecific and too small for PET resolution. Continued  attention on follow-up exams is warranted. 4. Intermediate density lesion deep to the left gluteal fold, with associated hypermetabolism, which may represent a complex cyst or abscess. 5. Aortic atherosclerosis. Coronary artery calcification. 6. Left renal stone. -Treatment plan include R-CHOP for 6 cycles starting 05/10/18   07/11/18 PET/CT revealed No evidence of recurrent lymphoma. 2. Left renal stone. 3. Aortic atherosclerosis. Coronary artery calcification.   S/p 6 cycles of R-CHOP completed on 08/24/18  09/22/18 PET/CT revealed No evidence of hypermetabolic active lymphoma. 2. Aortic atherosclerosis, coronary artery atherosclerosis and emphysema. 3. Left nephrolithiasis. 4. Sinus disease.  PLAN:  -Discussed pt labwork today, 10/02/19; blood counts and chemistries are stable.  -Discussed 10/02/2019 LDH is WNL at 146 -No lab or clinical evidence of DLBCL recurrence/progression at this time. -No indication for additional workup or rx for his lymphoma at this time. -Will continue to monitor with labs and clinic visits every 3-4 months -Recommended pt receive COVID19 vaccine when available -Recommended that the pt continue to eat well, drink at least 48-64 oz of water each day, and walk 20-30 minutes each day.  -Will refer to social worker, York Spaniel, regarding memory issues and living assistance -Will see back in 3 months with labs  2.  Patient Active Problem List   Diagnosis Date Noted  . Former smoker 08/16/2019  . Depression, major, single episode, mild ([place]) 08/16/2019  . Diffuse large B-cell lymphoma ([place]) 03/30/2018  . Degeneration of lumbar intervertebral disc 10/05/2017  . History of total hip arthroplasty 10/05/2017  . OA (osteoarthritis) of hip 05/31/2013  . History of thyroid cancer 04/21/2011  . Macular degeneration 01/05/2011  . HYPOTHYROIDISM, POSTSURGICAL 11/20/2008  . Osteoarthritis 07/01/2007  . Uric acid renal calculus 06/20/2007  . Essential hypertension  06/20/2007  -continue f/u with PCP for mx of other chronic medical issues.   FOLLOW UP: RTC with Dr Irene Limbo with labs in 3 months Social worker consultation regarding memory issues and consideration of transitioning to Assisted living    The total time spent in the appt was 20 minutes and more than 50% was on counseling and direct patient cares.  All of the patient's questions were answered with apparent satisfaction. The patient knows to call the clinic with any problems, questions or concerns.   Sullivan Lone MD [place] AAHIVMS [facility] Hematology/Oncology Physician [facility]  (Office):       [phone] (Work cell):  [phone] (Fax):           [phone]  10/02/2019 12:31 PM  I, Yevette Edwards, am acting as a scribe for Dr.  Sullivan Lone.   .I have reviewed the above documentation for accuracy and completeness, and I agree with the above. Brunetta Genera MD

## 2019-10-03 ENCOUNTER — Encounter: Payer: Self-pay | Admitting: *Deleted

## 2019-10-03 ENCOUNTER — Telehealth: Payer: Self-pay | Admitting: Hematology

## 2019-10-03 NOTE — Progress Notes (Signed)
[place] Work  Clinical Social Work received referral from Therapist, sports for additional support and resources in the home.  CSW contacted patient at home to offer support and assess for needs.  CSW left patient a detailed message offering support and encouraging patient to return call.   Johnnye Lana, MSW, LCSW, OSW-C Clinical Social Worker [facility] [phone]

## 2019-10-03 NOTE — Telephone Encounter (Signed)
Scheduled per 01/25 los, called patient and left a voicemail. 

## 2019-10-06 ENCOUNTER — Encounter: Payer: Self-pay | Admitting: General Practice

## 2019-10-06 NOTE — Progress Notes (Signed)
[place] CSW Progress Notes  Referral received from oncologist, concerns about memory issues and possible transition to assisted living.  Per patient, he has had visits from friends and contact w niece.  Wants to work on Technical brewer.  Feels that he is capable of taking care of all his basic needs at home.  Mows his lawn in stages.  Lives close to grocery store, is able to get there regularly.  Drives to appointments.  Isnot ready to consider options like assisted living or similar.  Feels he is able to care for himself and does not want additional resources.  Edwyna Shell, LCSW Clinical Social Worker Phone:  [phone]

## 2019-10-11 ENCOUNTER — Other Ambulatory Visit: Payer: Self-pay | Admitting: Family Medicine

## 2019-10-11 DIAGNOSIS — F339 Major depressive disorder, recurrent, unspecified: Secondary | ICD-10-CM

## 2019-10-11 NOTE — Telephone Encounter (Signed)
This is Dr Ronney Lion pt, please advise

## 2019-10-19 DIAGNOSIS — H35371 Puckering of macula, right eye: Secondary | ICD-10-CM | POA: Diagnosis not present

## 2019-10-19 DIAGNOSIS — H35723 Serous detachment of retinal pigment epithelium, bilateral: Secondary | ICD-10-CM | POA: Diagnosis not present

## 2019-10-19 DIAGNOSIS — H353231 Exudative age-related macular degeneration, bilateral, with active choroidal neovascularization: Secondary | ICD-10-CM | POA: Diagnosis not present

## 2019-10-19 DIAGNOSIS — Z961 Presence of intraocular lens: Secondary | ICD-10-CM | POA: Diagnosis not present

## 2019-10-27 DIAGNOSIS — Z20828 Contact with and (suspected) exposure to other viral communicable diseases: Secondary | ICD-10-CM | POA: Diagnosis not present

## 2019-12-13 DIAGNOSIS — Z961 Presence of intraocular lens: Secondary | ICD-10-CM | POA: Insufficient documentation

## 2019-12-14 DIAGNOSIS — H35723 Serous detachment of retinal pigment epithelium, bilateral: Secondary | ICD-10-CM | POA: Diagnosis not present

## 2019-12-14 DIAGNOSIS — H353231 Exudative age-related macular degeneration, bilateral, with active choroidal neovascularization: Secondary | ICD-10-CM | POA: Diagnosis not present

## 2019-12-14 DIAGNOSIS — Z961 Presence of intraocular lens: Secondary | ICD-10-CM | POA: Diagnosis not present

## 2019-12-14 DIAGNOSIS — H35371 Puckering of macula, right eye: Secondary | ICD-10-CM | POA: Diagnosis not present

## 2020-01-01 ENCOUNTER — Inpatient Hospital Stay: Payer: Medicare PPO | Attending: Hematology

## 2020-01-01 ENCOUNTER — Inpatient Hospital Stay: Payer: Medicare PPO | Admitting: Hematology

## 2020-01-01 ENCOUNTER — Other Ambulatory Visit: Payer: Self-pay

## 2020-01-01 ENCOUNTER — Telehealth: Payer: Self-pay | Admitting: Hematology

## 2020-01-01 VITALS — BP 119/58 | HR 81 | Temp 98.9°F | Resp 17 | Ht 64.0 in | Wt 180.5 lb

## 2020-01-01 DIAGNOSIS — C8330 Diffuse large B-cell lymphoma, unspecified site: Secondary | ICD-10-CM

## 2020-01-01 DIAGNOSIS — M1990 Unspecified osteoarthritis, unspecified site: Secondary | ICD-10-CM | POA: Insufficient documentation

## 2020-01-01 DIAGNOSIS — Z79899 Other long term (current) drug therapy: Secondary | ICD-10-CM | POA: Insufficient documentation

## 2020-01-01 DIAGNOSIS — I10 Essential (primary) hypertension: Secondary | ICD-10-CM | POA: Insufficient documentation

## 2020-01-01 DIAGNOSIS — Z87891 Personal history of nicotine dependence: Secondary | ICD-10-CM | POA: Diagnosis not present

## 2020-01-01 DIAGNOSIS — E890 Postprocedural hypothyroidism: Secondary | ICD-10-CM | POA: Diagnosis not present

## 2020-01-01 DIAGNOSIS — Z85850 Personal history of malignant neoplasm of thyroid: Secondary | ICD-10-CM | POA: Insufficient documentation

## 2020-01-01 LAB — CBC WITH DIFFERENTIAL/PLATELET
Abs Immature Granulocytes: 0.01 10*3/uL (ref 0.00–0.07)
Basophils Absolute: 0 10*3/uL (ref 0.0–0.1)
Basophils Relative: 1 %
Eosinophils Absolute: 0.1 10*3/uL (ref 0.0–0.5)
Eosinophils Relative: 2 %
HCT: 37 % — ABNORMAL LOW (ref 39.0–52.0)
Hemoglobin: 12.3 g/dL — ABNORMAL LOW (ref 13.0–17.0)
Immature Granulocytes: 0 %
Lymphocytes Relative: 19 %
Lymphs Abs: 1 10*3/uL (ref 0.7–4.0)
MCH: 30.4 pg (ref 26.0–34.0)
MCHC: 33.2 g/dL (ref 30.0–36.0)
MCV: 91.6 fL (ref 80.0–100.0)
Monocytes Absolute: 0.4 10*3/uL (ref 0.1–1.0)
Monocytes Relative: 7 %
Neutro Abs: 3.6 10*3/uL (ref 1.7–7.7)
Neutrophils Relative %: 71 %
Platelets: 168 10*3/uL (ref 150–400)
RBC: 4.04 MIL/uL — ABNORMAL LOW (ref 4.22–5.81)
RDW: 13.3 % (ref 11.5–15.5)
WBC: 5.1 10*3/uL (ref 4.0–10.5)
nRBC: 0 % (ref 0.0–0.2)

## 2020-01-01 LAB — CMP (CANCER CENTER ONLY)
ALT: 19 U/L (ref 0–44)
AST: 18 U/L (ref 15–41)
Albumin: 3.4 g/dL — ABNORMAL LOW (ref 3.5–5.0)
Alkaline Phosphatase: 94 U/L (ref 38–126)
Anion gap: 11 (ref 5–15)
BUN: 19 mg/dL (ref 8–23)
CO2: 23 mmol/L (ref 22–32)
Calcium: 8.6 mg/dL — ABNORMAL LOW (ref 8.9–10.3)
Chloride: 108 mmol/L (ref 98–111)
Creatinine: 0.85 mg/dL (ref 0.61–1.24)
GFR, Est AFR Am: >60 mL/min (ref >60)
GFR, Estimated: >60 mL/min (ref >60)
Glucose, Bld: 101 mg/dL — ABNORMAL HIGH (ref 70–99)
Potassium: 4.2 mmol/L (ref 3.5–5.1)
Sodium: 142 mmol/L (ref 135–145)
Total Bilirubin: 0.3 mg/dL (ref 0.3–1.2)
Total Protein: 6.6 g/dL (ref 6.5–8.1)

## 2020-01-01 LAB — LACTATE DEHYDROGENASE: LDH: 207 U/L — ABNORMAL HIGH (ref 98–192)

## 2020-01-01 NOTE — Telephone Encounter (Signed)
Scheduled per los. Gave avs and calendar  

## 2020-01-01 NOTE — Progress Notes (Signed)
HEMATOLOGY/ONCOLOGY CLINIC NOTE  Date of Service: 01/01/2020  Patient Care Team: Marin Olp, MD as PCP - General (Family Medicine) Brunetta Genera, MD as Consulting Physician (Hematology) Justice Britain, MD as Consulting Physician (Orthopedic Surgery) Armandina Gemma, MD as Consulting Physician (General Surgery) Zadie Rhine Clent Demark, MD as Consulting Physician (Ophthalmology) Suella Broad, MD as Consulting Physician (Physical Medicine and Rehabilitation) Zebedee Iba., MD as Referring Physician (Ophthalmology) Gaynelle Arabian, MD as Consulting Physician (Orthopedic Surgery)  CHIEF COMPLAINTS/PURPOSE OF CONSULTATION:  Diffuse Large B-Cell Lymphoma  HISTORY OF PRESENTING ILLNESS:   Jay Webster is a wonderful 86 y.o. male who has been referred to [place] by surgeon Dr. Armandina Gemma for evaluation and management of Diffuse Large B-Cell Lymphoma. The pt reports that he is doing well overall.   Of note prior to the patient's visit today, pt has had Perineum biopsy completed on 03/11/18 with results revealing Diffuse Large B-Cell Lymphoma. The pt had surgery to remove his irregularly shaped mass in the left anterior portion of the perineum with Dr. Armandina Gemma.  The pt reports first noticing the mass in his left buttock about 2 months ago, after a visit with Dr. Trena Platt. The pt decided to consult his surgeon Dr. Armandina Gemma whom he previously had a thyroidectomy with. The pt notes that the spot became as large as a golf ball and did not breach the skin. The pt notes that it wasn't terribly uncomfortable to sit on. He denies feeling any differently recently than in the past  6 months to a year.   The pt notes that he has been having some continued discharge and is using Depends. He is changing his gauze twice each day.   He denies any recent medical problems. He continues to function independently at home and lives alone, as his partner of 30+ years died in the past year. He notes  that he does not have any family members or friends who he would want to include in his care. He denies any difficulties functioning day to day and denies concerns for his memory.   He has been on thyroid replacement for the last 10 years following a thyroidectomy after thyroid cancer, and was treated with one dose of radioactive iodine.   The pt also notes a uric acid kidney stone history which hasn't been a problem in many years.    Most recent lab results (02/28/18) of CBC  is as follows: all values are WNL except for HCT at 38.3.  On review of systems, pt reports left buttock surgical wound, ganglion cyst on right wrist, and denies fevers, chills, night sweats, unexpected weight loss, new fatigue, pain along the spine, leg swelling, noticing any new lumps or bumps, and any other symptoms.   On PMHx the pt denies heart problems, strokes, abdominal problems, lung problems.  On Social Hx the pt reports that he quit smoking in 1990, and used to smoke 1-2 packs of cigarettes each day before this. He denies excess alcohol being a problem, and consumes one glass of wine rarely. 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  On Family Hx the pt reports brother died of sarcoma, different brother with Type I DM, and denies other cancer or blood disorders.  Interval History:    Jay Webster returns today for management and evaluation of his Diffuse Large B-Cell Lymphoma. The patient's last visit with [place] was on 10/02/2019. The pt reports that  he is doing well overall.  The pt reports that he is having depressive moods and discontented with life. Pt has received both does of the COVID19 vaccine.   Lab results today (01/01/20) of CBC w/diff and CMP is as follows: all values are WNL except for RBC at 4.04, Hgb at 12.3, HCT at 37.0, Glucose at 101, Calcium at 8.6, Albumin at 3.4. 01/01/2020 LDH at 207  On review of systems, pt reports depressive mood and denies any  other symptoms.   MEDICAL HISTORY:   Past Medical History:  Diagnosis Date  . Arthritis    OA AND PAIN LEFT HIP AND OTHER JOINT PAINS  . Cancer (HCC)    THYROID CANCER - HAD THYROID REMOVED  . History of kidney stones   . History of shingles    RESOLVED  . Hypertension   . Hypothyroidism   . Low back pain   . Macular degeneration of both eyes   . RBBB (right bundle branch block with left anterior fascicular block)   . Seasonal allergies   . Thyroid disease   . Uric acid renal calculus 06/20/2007   Qualifier: Diagnosis of  By: Rogue Bussing CMA, Maryann Alar      SURGICAL HISTORY: Past Surgical History:  Procedure Laterality Date  . APPENDECTOMY  1948  . CATARACT EXTRACTION Right 08/27/2019  . COLONOSCOPY    . Elbow Radical Reduction  1973  . IR IMAGING GUIDED PORT INSERTION  05/05/2018  . IR REMOVAL TUN ACCESS W/ PORT W/O FL MOD SED  10/18/2018  . [place]  . MASS EXCISION N/A 03/11/2018   Procedure: EXCISION MASS OF PERINEUM;  Surgeon: Armandina Gemma, MD;  Location: [facility];  Service: General;  Laterality: N/A;  . REMOVAL OF FIRST RIB   NOV 1994   FOR COMPRESSED VEIN BETWEEN 1 ST RIB AND COLLARBONE  . Rib removed, 1st  1994  . ROTATOR CUFF REPAIR  2009  . THYROIDECTOMY  2010  . TOTAL HIP ARTHROPLASTY Left 05/31/2013   Procedure: LEFT TOTAL HIP ARTHROPLASTY ANTERIOR APPROACH;  Surgeon: Gearlean Alf, MD;  Location: [facility];  Service: Orthopedics;  Laterality: Left;    SOCIAL HISTORY: Social History   Socioeconomic History  . Marital status: Widowed    Spouse name: Not on file  . Number of children: Not on file  . Years of education: Not on file  . Highest education level: Not on file  Occupational History  . Occupation: retired  Tobacco Use  . Smoking status: Former Smoker    Types: Cigarettes    Quit date: 02/22/1979    Years since quitting: 40.8  . Smokeless tobacco: Never Used  Substance and Sexual Activity  . Alcohol use: Yes     Alcohol/week: 0.0 - 1.0 standard drinks  . Drug use: No  . Sexual activity: Not Currently  Other Topics Concern  . Not on file  Social History Narrative   Lives alone and 1 cat. Divorced from mongomous long term partner and eventual husband. No children from prior relationships.       Retired Geologist, engineering: socializing, used to play tennis   Social Determinants of Radio broadcast assistant Strain:   . Difficulty of Paying Living Expenses:   Food Insecurity:   . Worried About Charity fundraiser in the Last Year:   . [place] in the Last Year:   Transportation Needs:   . Lack of  Transportation (Medical):   Marland Kitchen Lack of Transportation (Non-Medical):   Physical Activity:   . Days of Exercise per Week:   . Minutes of Exercise per Session:   Stress:   . Feeling of Stress :   Social Connections:   . Frequency of Communication with Friends and Family:   . Frequency of Social Gatherings with Friends and Family:   . Attends Religious Services:   . Active Member of Clubs or Organizations:   . Attends Archivist Meetings:   Marland Kitchen Marital Status:   Intimate Partner Violence:   . Fear of Current or Ex-Partner:   . Emotionally Abused:   Marland Kitchen Physically Abused:   . Sexually Abused:     FAMILY HISTORY: Family History  Problem Relation Age of Onset  . Heart disease Mother        passed in 47s  . Diabetes Mother   . Alcohol abuse Father        passed from this  . Parkinson's disease Sister   . Diabetes Brother   . Dementia Sister   . Cancer Brother        sounds like bone cancer- non healing knee injury    ALLERGIES:  is allergic to penicillins; shingrix [zoster vac recomb adjuvanted]; and zoloft [sertraline hcl].  MEDICATIONS:  Current Outpatient Medications  Medication Sig Dispense Refill  . acetaminophen (TYLENOL) 325 MG tablet Take 325-650 mg by mouth every 6 (six) hours as needed for headache.     . Aflibercept (EYLEA) 2 MG/0.05ML SOLN  Inject 1 Dose into the eye every 8 (eight) weeks.     Marland Kitchen allopurinol (ZYLOPRIM) 300 MG tablet TAKE 1 TABLET EVERY DAY 90 tablet 3  . benazepril (LOTENSIN) 20 MG tablet TAKE 1 TABLET EVERY DAY 90 tablet 3  . cetirizine (ZYRTEC) 10 MG tablet Take 10 mg by mouth daily as needed for allergies.    . fluticasone (FLONASE) 50 MCG/ACT nasal spray Place 2 sprays into both nostrils daily. 16 g 0  . Glucosamine-Chondroitin (OSTEO BI-FLEX REGULAR STRENGTH PO) Take 1 tablet by mouth 2 (two) times daily.    Marland Kitchen levothyroxine (SYNTHROID) 125 MCG tablet Take 1 tablet (125 mcg total) by mouth daily. 90 tablet 3  . Multiple Vitamin (MULTIVITAMIN) tablet Take 1 tablet by mouth daily.    . Multiple Vitamins-Minerals (PRESERVISION AREDS 2 PO) Take 1 tablet by mouth 2 (two) times daily.    Vladimir Faster Glycol-Propyl Glycol (SYSTANE OP) Place 1 drop into both eyes daily as needed (irritation).    . sodium chloride (OCEAN) 0.65 % SOLN nasal spray Place 1 spray into both nostrils as needed for congestion.     No current facility-administered medications for this visit.    REVIEW OF SYSTEMS:   A 10+ POINT REVIEW OF SYSTEMS WAS OBTAINED including neurology, dermatology, psychiatry, cardiac, respiratory, lymph, extremities, GI, GU, Musculoskeletal, constitutional, breasts, reproductive, HEENT.  All pertinent positives are noted in the HPI.  All others are negative.   PHYSICAL EXAMINATION: ECOG FS:2 - Symptomatic, <50% confined to bed  Vitals:   01/01/20 1107  BP: (!) 119/58  Pulse: 81  Resp: 17  Temp: 98.9 F (37.2 C)  SpO2: 99%   Wt Readings from Last 3 Encounters:  01/01/20 180 lb 8 oz (81.9 kg)  10/02/19 173 lb 6.4 oz (78.7 kg)  08/16/19 172 lb 12.8 oz (78.4 kg)   Body mass index is 30.98 kg/m.    GENERAL:alert, in no acute distress and comfortable SKIN: no acute  rashes, no significant lesions EYES: conjunctiva are pink and non-injected, sclera anicteric OROPHARYNX: MMM, no exudates, no oropharyngeal  erythema or ulceration NECK: supple, no JVD LYMPH:  no palpable lymphadenopathy in the cervical, axillary or inguinal regions LUNGS: clear to auscultation b/l with normal respiratory effort HEART: regular rate & rhythm ABDOMEN:  normoactive bowel sounds , non tender, not distended. No palpable hepatosplenomegaly.  Extremity: no pedal edema PSYCH: alert & oriented x 3 with fluent speech NEURO: no focal motor/sensory deficits  LABORATORY DATA:  I have reviewed the data as listed  . CBC Latest Ref Rng & Units 01/01/2020 10/02/2019 06/30/2019  WBC 4.0 - 10.5 K/uL 5.1 4.8 5.2  Hemoglobin 13.0 - 17.0 g/dL 12.3(L) 11.7(L) 11.3(L)  Hematocrit 39.0 - 52.0 % 37.0(L) 35.3(L) 33.8(L)  Platelets 150 - 400 K/uL 168 186 209   . CBC    Component Value Date/Time   WBC 5.1 01/01/2020 1014   RBC 4.04 (L) 01/01/2020 1014   HGB 12.3 (L) 01/01/2020 1014   HGB 10.4 (L) 08/02/2018 0828   HCT 37.0 (L) 01/01/2020 1014   PLT 168 01/01/2020 1014   PLT 260 08/02/2018 0828   MCV 91.6 01/01/2020 1014   MCH 30.4 01/01/2020 1014   MCHC 33.2 01/01/2020 1014   RDW 13.3 01/01/2020 1014   LYMPHSABS 1.0 01/01/2020 1014   MONOABS 0.4 01/01/2020 1014   EOSABS 0.1 01/01/2020 1014   BASOSABS 0.0 01/01/2020 1014    CMP Latest Ref Rng & Units 01/01/2020 10/02/2019 06/30/2019  Glucose 70 - 99 mg/dL 101(H) 110(H) 115(H)  BUN 8 - 23 mg/dL 19 16 22   Creatinine 0.61 - 1.24 mg/dL 0.85 0.75 0.75  Sodium 135 - 145 mmol/L 142 140 139  Potassium 3.5 - 5.1 mmol/L 4.2 4.4 4.1  Chloride 98 - 111 mmol/L 108 108 106  CO2 22 - 32 mmol/L 23 25 26   Calcium 8.9 - 10.3 mg/dL 8.6(L) 8.8(L) 8.9  Total Protein 6.5 - 8.1 g/dL 6.6 6.6 6.6  Total Bilirubin 0.3 - 1.2 mg/dL 0.3 0.4 0.3  Alkaline Phos 38 - 126 U/L 94 94 97  AST 15 - 41 U/L 18 18 17   ALT 0 - 44 U/L 19 18 19    . Lab Results  Component Value Date   LDH 207 (H) 01/01/2020   Component     Latest Ref Rng & Units 03/24/2018  LDH     98 - 192 U/L 185  HCV Ab     0.0 -  0.9 s/co ratio 0.2  Hep B Core Ab, Tot     Negative Negative  Hepatitis B Surface Ag     Negative Negative  HIV Screen 4th Generation wRfx     Non Reactive Non Reactive    03/11/18 Perineum Bx:    RADIOGRAPHIC STUDIES: I have personally reviewed the radiological images as listed and agreed with the findings in the report. No results found.  ASSESSMENT & PLAN:   86 y.o. male with  1. Diffuse Large B-Cell Lymphoma -newly diagnosed presenting as a mass in the left medial gluteal region/perineum s/p resection. PLAN -03/11/18 Surgical bx which revealed Diffuse Large B-Cell Non-Hodgkin's Lymphoma  -04/13/18 ECHO revealed LV EF of 60-65%  -No Hep B, Hep C or HIV from 03/24/18 labs -04/18/18 PET/CT which revealed Mildly hypermetabolic small to borderline enlarged abdominal and pelvic retroperitoneal lymph nodes. Probable mild hypermetabolism within subcentimeter axillary lymph nodes, with misregistration on fused images. 2. Scattered lucent lesions in the iliac wings and right fifth rib.  Continued attention on follow-up exams is warranted. 3. Scattered small pulmonary nodules are nonspecific and too small for PET resolution. Continued attention on follow-up exams is warranted. 4. Intermediate density lesion deep to the left gluteal fold, with associated hypermetabolism, which may represent a complex cyst or abscess. 5. Aortic atherosclerosis. Coronary artery calcification. 6. Left renal stone. -Treatment plan include R-CHOP for 6 cycles starting 05/10/18   07/11/18 PET/CT revealed No evidence of recurrent lymphoma. 2. Left renal stone. 3. Aortic atherosclerosis. Coronary artery calcification.   S/p 6 cycles of R-CHOP completed on 08/24/18  09/22/18 PET/CT revealed No evidence of hypermetabolic active lymphoma. 2. Aortic atherosclerosis, coronary artery atherosclerosis and emphysema. 3. Left nephrolithiasis. 4. Sinus disease.  PLAN:  -Discussed pt labwork today, 01/01/20; improving anemia, Calcium is  low, LDH is borderline high at 207 -Recommend pt consider moving to an assisted moving facility given memory concerns. -Offered to refer pt to a mental health physician - pt declines at this time  -No lab or clinical evidence of DLBCL recurrence/progression at this time.  -No indication for additional workup or rx for his lymphoma at this time. -Will continue to monitor with labs and clinic visits every 3-4 months.  -Will see back in 3 months with labs, sooner if any new concerns.  2.  Patient Active Problem List   Diagnosis Date Noted  . Former smoker 08/16/2019  . Depression, major, single episode, mild ([place]) 08/16/2019  . Diffuse large B-cell lymphoma ([place]) 03/30/2018  . Degeneration of lumbar intervertebral disc 10/05/2017  . History of total hip arthroplasty 10/05/2017  . OA (osteoarthritis) of hip 05/31/2013  . History of thyroid cancer 04/21/2011  . Macular degeneration 01/05/2011  . HYPOTHYROIDISM, POSTSURGICAL 11/20/2008  . Osteoarthritis 07/01/2007  . Uric acid renal calculus 06/20/2007  . Essential hypertension 06/20/2007  -continue f/u with PCP for mx of other chronic medical issues.   FOLLOW UP: RTC with Dr Irene Limbo with labs in 3 months     The total time spent in the appt was 20 minutes and more than 50% was on counseling and direct patient cares.  All of the patient's questions were answered with apparent satisfaction. The patient knows to call the clinic with any problems, questions or concerns.   Sullivan Lone MD [place] AAHIVMS [facility] Hematology/Oncology Physician [facility]  (Office):       [phone] (Work cell):  [phone] (Fax):           [phone]  01/01/2020 11:45 AM  I, Yevette Edwards, am acting as a scribe for Dr. Sullivan Lone.   .I have reviewed the above documentation for accuracy and completeness, and I agree with the above. Brunetta Genera MD

## 2020-02-08 DIAGNOSIS — H25813 Combined forms of age-related cataract, bilateral: Secondary | ICD-10-CM | POA: Diagnosis not present

## 2020-02-08 DIAGNOSIS — H353231 Exudative age-related macular degeneration, bilateral, with active choroidal neovascularization: Secondary | ICD-10-CM | POA: Diagnosis not present

## 2020-02-08 DIAGNOSIS — H35371 Puckering of macula, right eye: Secondary | ICD-10-CM | POA: Diagnosis not present

## 2020-02-08 DIAGNOSIS — H35723 Serous detachment of retinal pigment epithelium, bilateral: Secondary | ICD-10-CM | POA: Diagnosis not present

## 2020-02-08 DIAGNOSIS — Z961 Presence of intraocular lens: Secondary | ICD-10-CM | POA: Diagnosis not present

## 2020-02-17 NOTE — Patient Instructions (Addendum)
Please stop by lab before you go If you have mychart- we will send your results within 3 business days of [place] receiving them.  If you do not have mychart- we will call you about results within 5 business days of [place] receiving them.      6 month follow up or sooner if you need [place].  Look into the senior center activities.

## 2020-02-19 ENCOUNTER — Ambulatory Visit: Payer: Medicare PPO | Admitting: Family Medicine

## 2020-02-19 ENCOUNTER — Other Ambulatory Visit: Payer: Self-pay

## 2020-02-19 ENCOUNTER — Encounter: Payer: Self-pay | Admitting: Family Medicine

## 2020-02-19 VITALS — BP 130/70 | HR 80 | Temp 98.7°F | Ht 64.0 in | Wt 176.0 lb

## 2020-02-19 DIAGNOSIS — C8330 Diffuse large B-cell lymphoma, unspecified site: Secondary | ICD-10-CM

## 2020-02-19 DIAGNOSIS — E782 Mixed hyperlipidemia: Secondary | ICD-10-CM

## 2020-02-19 DIAGNOSIS — Z0000 Encounter for general adult medical examination without abnormal findings: Secondary | ICD-10-CM | POA: Diagnosis not present

## 2020-02-19 DIAGNOSIS — J439 Emphysema, unspecified: Secondary | ICD-10-CM

## 2020-02-19 DIAGNOSIS — I10 Essential (primary) hypertension: Secondary | ICD-10-CM | POA: Diagnosis not present

## 2020-02-19 DIAGNOSIS — E890 Postprocedural hypothyroidism: Secondary | ICD-10-CM | POA: Diagnosis not present

## 2020-02-19 DIAGNOSIS — F0390 Unspecified dementia without behavioral disturbance: Secondary | ICD-10-CM | POA: Insufficient documentation

## 2020-02-19 DIAGNOSIS — R413 Other amnesia: Secondary | ICD-10-CM | POA: Diagnosis not present

## 2020-02-19 DIAGNOSIS — Z85850 Personal history of malignant neoplasm of thyroid: Secondary | ICD-10-CM | POA: Diagnosis not present

## 2020-02-19 DIAGNOSIS — I700 Atherosclerosis of aorta: Secondary | ICD-10-CM | POA: Diagnosis not present

## 2020-02-19 DIAGNOSIS — F320 Major depressive disorder, single episode, mild: Secondary | ICD-10-CM

## 2020-02-19 LAB — CBC WITH DIFFERENTIAL/PLATELET
Basophils Absolute: 0.1 10*3/uL (ref 0.0–0.1)
Basophils Relative: 1 % (ref 0.0–3.0)
Eosinophils Absolute: 0.1 10*3/uL (ref 0.0–0.7)
Eosinophils Relative: 1.3 % (ref 0.0–5.0)
HCT: 37 % — ABNORMAL LOW (ref 39.0–52.0)
Hemoglobin: 12.8 g/dL — ABNORMAL LOW (ref 13.0–17.0)
Lymphocytes Relative: 21.8 % (ref 12.0–46.0)
Lymphs Abs: 1.3 10*3/uL (ref 0.7–4.0)
MCHC: 34.5 g/dL (ref 30.0–36.0)
MCV: 89.6 fl (ref 78.0–100.0)
Monocytes Absolute: 0.4 10*3/uL (ref 0.1–1.0)
Monocytes Relative: 7.1 % (ref 3.0–12.0)
Neutro Abs: 4 10*3/uL (ref 1.4–7.7)
Neutrophils Relative %: 68.8 % (ref 43.0–77.0)
Platelets: 203 10*3/uL (ref 150.0–400.0)
RBC: 4.13 Mil/uL — ABNORMAL LOW (ref 4.22–5.81)
RDW: 13.8 % (ref 11.5–15.5)
WBC: 5.8 10*3/uL (ref 4.0–10.5)

## 2020-02-19 LAB — COMPREHENSIVE METABOLIC PANEL
ALT: 14 U/L (ref 0–53)
AST: 16 U/L (ref 0–37)
Albumin: 4 g/dL (ref 3.5–5.2)
Alkaline Phosphatase: 80 U/L (ref 39–117)
BUN: 17 mg/dL (ref 6–23)
CO2: 27 mEq/L (ref 19–32)
Calcium: 9.1 mg/dL (ref 8.4–10.5)
Chloride: 104 mEq/L (ref 96–112)
Creatinine, Ser: 0.77 mg/dL (ref 0.40–1.50)
GFR: 95.86 mL/min (ref >60.00)
Glucose, Bld: 90 mg/dL (ref 70–99)
Potassium: 4.1 mEq/L (ref 3.5–5.1)
Sodium: 138 mEq/L (ref 135–145)
Total Bilirubin: 0.5 mg/dL (ref 0.2–1.2)
Total Protein: 6.6 g/dL (ref 6.0–8.3)

## 2020-02-19 LAB — LIPID PANEL
Cholesterol: 146 mg/dL (ref 0–200)
HDL: 63.9 mg/dL (ref >39.00)
LDL Cholesterol: 69 mg/dL (ref 0–99)
NonHDL: 81.85
Total CHOL/HDL Ratio: 2
Triglycerides: 63 mg/dL (ref 0.0–149.0)
VLDL: 12.6 mg/dL (ref 0.0–40.0)

## 2020-02-19 LAB — VITAMIN B12: Vitamin B-12: 592 pg/mL (ref 211–911)

## 2020-02-19 LAB — TSH: TSH: 1.59 u[IU]/mL (ref 0.35–4.50)

## 2020-02-19 NOTE — Progress Notes (Signed)
Phone: [phone]   Subjective:  Patient presents today for their annual physical. Chief complaint-noted.   See problem oriented charting- Review of Systems  Constitutional: Negative for chills and fever.  HENT: Positive for hearing loss. Negative for ear pain.   Eyes: Negative for blurred vision and double vision.  Respiratory: Negative for cough (occasional with allergies), shortness of breath and wheezing.   Cardiovascular: Negative for chest pain and palpitations.  Gastrointestinal: Negative for abdominal pain and vomiting.  Genitourinary: Negative for dysuria and flank pain.  Musculoskeletal: Positive for joint pain. Negative for falls.  Skin: Negative for itching and rash.  Neurological: Negative for seizures and loss of consciousness.  Endo/Heme/Allergies: Negative for polydipsia. Does not bruise/bleed easily.  Psychiatric/Behavioral: Positive for depression. Negative for substance abuse and suicidal ideas.   The following were reviewed and entered/updated in epic: Past Medical History:  Diagnosis Date  . Arthritis    OA AND PAIN LEFT HIP AND OTHER JOINT PAINS  . Cancer (HCC)    THYROID CANCER - HAD THYROID REMOVED  . History of kidney stones   . History of shingles    RESOLVED  . Hypertension   . Hypothyroidism   . Low back pain   . Macular degeneration of both eyes   . RBBB (right bundle branch block with left anterior fascicular block)   . Seasonal allergies   . Thyroid disease   . Uric acid renal calculus 06/20/2007   Qualifier: Diagnosis of  By: Rogue Bussing CMA, Jacqualynn     Patient Active Problem List   Diagnosis Date Noted  . Diffuse large B-cell lymphoma ([place]) 03/30/2018    Priority: High  . Pulmonary emphysema, unspecified emphysema type ([place]) 02/19/2020    Priority: Medium  . Depression, major, single episode, mild ([place]) 08/16/2019    Priority: Medium  . History of total hip arthroplasty 10/05/2017    Priority: Medium  . History of thyroid cancer  04/21/2011    Priority: Medium  . Macular degeneration 01/05/2011    Priority: Medium  . HYPOTHYROIDISM, POSTSURGICAL 11/20/2008    Priority: Medium  . Osteoarthritis 07/01/2007    Priority: Medium  . Uric acid renal calculus 06/20/2007    Priority: Medium  . Essential hypertension 06/20/2007    Priority: Medium  . Former smoker 08/16/2019    Priority: Low  . Degeneration of lumbar intervertebral disc 10/05/2017    Priority: Low  . OA (osteoarthritis) of hip 05/31/2013    Priority: Low  . Atherosclerosis of aorta ([place]) 02/19/2020   Past Surgical History:  Procedure Laterality Date  . APPENDECTOMY  1948  . CATARACT EXTRACTION Right 08/27/2019  . COLONOSCOPY    . Elbow Radical Reduction  1973  . IR IMAGING GUIDED PORT INSERTION  05/05/2018  . IR REMOVAL TUN ACCESS W/ PORT W/O FL MOD SED  10/18/2018  . [place]  . MASS EXCISION N/A 03/11/2018   Procedure: EXCISION MASS OF PERINEUM;  Surgeon: Armandina Gemma, MD;  Location: [facility];  Service: General;  Laterality: N/A;  . REMOVAL OF FIRST RIB   NOV 1994   FOR COMPRESSED VEIN BETWEEN 1 ST RIB AND COLLARBONE  . Rib removed, 1st  1994  . ROTATOR CUFF REPAIR  2009  . THYROIDECTOMY  2010  . TOTAL HIP ARTHROPLASTY Left 05/31/2013   Procedure: LEFT TOTAL HIP ARTHROPLASTY ANTERIOR APPROACH;  Surgeon: Gearlean Alf, MD;  Location: [facility];  Service: Orthopedics;  Laterality: Left;    Family History  Problem  Relation Age of Onset  . Heart disease Mother        passed in 62s  . Diabetes Mother   . Alcohol abuse Father        passed from this  . Parkinson's disease Sister   . Diabetes Brother   . Dementia Sister   . Cancer Brother        sounds like bone cancer- non healing knee injury    Medications- reviewed and updated Current Outpatient Medications  Medication Sig Dispense Refill  . acetaminophen (TYLENOL) 325 MG tablet Take 325-650 mg by mouth every 6 (six) hours as needed for headache.     . Aflibercept  (EYLEA) 2 MG/0.05ML SOLN Inject 1 Dose into the eye every 8 (eight) weeks.     Marland Kitchen allopurinol (ZYLOPRIM) 300 MG tablet TAKE 1 TABLET EVERY DAY 90 tablet 3  . benazepril (LOTENSIN) 20 MG tablet TAKE 1 TABLET EVERY DAY 90 tablet 3  . cetirizine (ZYRTEC) 10 MG tablet Take 10 mg by mouth daily as needed for allergies.    . fluticasone (FLONASE) 50 MCG/ACT nasal spray Place 2 sprays into both nostrils daily. 16 g 0  . Glucosamine-Chondroitin (OSTEO BI-FLEX REGULAR STRENGTH PO) Take 1 tablet by mouth 2 (two) times daily.    Marland Kitchen levothyroxine (SYNTHROID) 125 MCG tablet Take 1 tablet (125 mcg total) by mouth daily. 90 tablet 3  . Multiple Vitamin (MULTIVITAMIN) tablet Take 1 tablet by mouth daily.    . Multiple Vitamins-Minerals (PRESERVISION AREDS 2 PO) Take 1 tablet by mouth 2 (two) times daily.    Vladimir Faster Glycol-Propyl Glycol (SYSTANE OP) Place 1 drop into both eyes daily as needed (irritation).    . sodium chloride (OCEAN) 0.65 % SOLN nasal spray Place 1 spray into both nostrils as needed for congestion.     No current facility-administered medications for this visit.    Allergies-reviewed and updated Allergies  Allergen Reactions  . Penicillins Swelling, Palpitations and Other (See Comments)    Swelling of joints. Has patient had a PCN reaction causing immediate rash, facial/tongue/throat swelling, SOB or lightheadedness with hypotension: Yes Has patient had a PCN reaction causing severe rash involving mucus membranes or skin necrosis: No Has patient had a PCN reaction that required hospitalization: Yes Has patient had a PCN reaction occurring within the last 10 years: No If all of the above answers are "NO", then may proceed with Cephalosporin use.   . Shingrix [Zoster Vac Recomb Adjuvanted]   . Zoloft [Sertraline Hcl] Rash    Social History   Social History Narrative   Lives alone and 1 cat. Divorced from mongomous long term partner and eventual husband. No children from prior  relationships.       Retired Geologist, engineering: socializing, used to play tennis   Objective  Objective:  BP 130/70   Pulse 80   Temp 98.7 F (37.1 C)   Ht 5\' 4"  (1.626 m)   Wt 176 lb (79.8 kg)   SpO2 97%   BMI 30.21 kg/m  Gen: NAD, resting comfortably HEENT: Mucous membranes are moist. Oropharynx normal Neck: thyroid surgically absent CV: RRR no murmurs rubs or gallops Lungs: CTAB no crackles, wheeze, rhonchi Abdomen: soft/nontender/nondistended/normal bowel sounds. No rebound or guarding. Scar on abdomen Ext: no edema Skin: warm, dry Neuro: grossly normal, moves all extremities, PERRLA, occasional word searching    Assessment and Plan  86 y.o. male presenting for annual physical.  Health Maintenance counseling: 1.  Anticipatory guidance: Patient counseled regarding regular dental exams q6 months, eye exams yearly- recent cataract surgery,  avoiding smoking and second hand smoke , limiting alcohol to 2 beverages per day- rare one drink a day- with memory concerns advised to avoid 2. Risk factor reduction:  Advised patient of need for regular exercise and diet rich and fruits and vegetables to reduce risk of heart attack and stroke. Exercise- not exercising other than yardwork- strongly encouraged walking or exercise. Diet- mostly reasonable- doing fair amount of frozen tv dinners. Hard to cook for one person. Could freeze his own meals.  Wt Readings from Last 3 Encounters:  02/19/20 176 lb (79.8 kg)  01/01/20 180 lb 8 oz (81.9 kg)  10/02/19 173 lb 6.4 oz (78.7 kg)  3. Immunizations/screenings/ancillary studies- up to date Immunization History  Administered Date(s) Administered  . Influenza Whole 06/22/2000, 07/01/2007, 05/17/2009  . Influenza, High Dose Seasonal PF 05/15/2015, 06/03/2018, 05/03/2019  . Influenza,inj,Quad PF,6+ Mos 05/17/2013  . Influenza-Unspecified 04/27/2014, 04/09/2015, 05/12/2016, 05/25/2017, 06/04/2017, 05/22/2018, 05/03/2019  . PFIZER  SARS-COV-2 Vaccination 11/16/2019, 12/11/2019  . Pneumococcal Conjugate-13 10/02/2014  . Pneumococcal Polysaccharide-23 09/07/2006, 01/02/2019, 05/03/2019  . Td 09/07/2005  . Tdap 01/07/2016  . Zoster 01/05/2011  . Zoster Recombinat (Shingrix) 05/05/2019   4. Prostate cancer screening- passed age based screening recs- comparing 2016 to 2020 PSA largely stable  Lab Results  Component Value Date   PSA 1.62 05/04/2019   PSA 1.07 12/31/2015   PSA 1.45 01/09/2015   5. Colon cancer screening - passed age based screening recommendatoins 6. Skin cancer screening- does not see dermatology. advised regular sunscreen use. Denies worrisome, changing, or new skin lesions.  7. former smoker-  8. STD screening - not sexually active- opts out  Status of chronic or acute concerns   #memory loss- reports some intermittent issues- will get b12 and check MMSE at follow up   # b cell lymphoma- continues to follow with oncologyDr. Irene Limbo- has bloodwork next month. He wouldn't want chemotherapy again. ldh scheduled for july  # Depression S: Medication: a lot of issues with loneliness after being divorced from his husband. Went out to eat for first time last week which was nice.   Depression screen [facility] 2/9 02/19/2020 08/16/2019 01/17/2018  Decreased Interest 0 3 3  Down, Depressed, Hopeless 0 1 3  PHQ - 2 Score 0 4 6  Altered sleeping 0 1 0  Tired, decreased energy 0 0 0  Change in appetite 1 2 0  Feeling bad or failure about yourself  0 2 0  Trouble concentrating 0 0 0  Moving slowly or fidgety/restless 0 0 0  Suicidal thoughts 0 0 1  PHQ-9 Score 1 9 7   Difficult doing work/chores Not difficult at all Not difficult at all -  A/P: per phq9 well controlled- I think he is underreporting symptoms- he feels like he is ready to die but would not hurt himself.  -wants to donate to [facility] liver research with things in his 401k -wants to be able to care for himself.  - offered counseling and medicaiton-  he wants to hold of ffor now -getting further out of pandemic may help.  -encouraged consider senior center activities declines for now- considering exercise class  #hypothyroidism/history of thyroid cancer S: compliant On thyroid medication- levothyroxine 125 mcg  Lab Results  Component Value Date   TSH 0.47 05/04/2019   A/P:Stable. Continue current medications. States did have 1 week he chose not to take this    #  hypertension S: medication: benazepril 20mg  BP Readings from Last 3 Encounters:  02/19/20 130/70  01/01/20 (!) 119/58  10/02/19 128/68  A/P: Stable. Continue current medications.   #hyperlipidemia/aortic atherosclerosis S: Medication:none  Lab Results  Component Value Date   CHOL 150 05/04/2019   HDL 62.10 05/04/2019   LDLCALC 74 05/04/2019   TRIG 67.0 05/04/2019   CHOLHDL 2 05/04/2019   A/P: he prefers not to start statin- will work on lifestyle/exerise- update lipids today  # allopurinol due to history of kidney stones- was advised to continue it and he has not had recurrence- may have been uric acid stones  Recommended follow up: Return in about 6 months (around 08/20/2020). Future Appointments  Date Time Provider [place]  04/01/2020  9:45 AM [facility] LAB 2 [facility] None  04/01/2020 10:20 AM Brunetta Genera, MD [facility] None   Lab/Order associations: fasting   ICD-10-CM   1. Preventative health care  Z00.00 CBC with Differential/Platelet    Comprehensive metabolic panel    Lipid panel    TSH  2. Essential hypertension  I10 Comprehensive metabolic panel  3. Mixed hyperlipidemia  E78.2 CBC with Differential/Platelet    Comprehensive metabolic panel    Lipid panel  4. Diffuse large B-cell lymphoma, unspecified body region ([place])  C83.30   5. History of thyroid cancer  Z85.850   6. HYPOTHYROIDISM, POSTSURGICAL  E89.0 TSH  7. Depression, major, single episode, mild (HCC)  F32.0   8. Pulmonary emphysema, unspecified emphysema type ([place])   J43.9   9. Atherosclerosis of aorta (HCC)  I70.0     No orders of the defined types were placed in this encounter.   Return precautions advised.  Garret Reddish, MD

## 2020-02-19 NOTE — Assessment & Plan Note (Signed)
Incidental finding on prior CT scan- no breathing issues. Will monitor

## 2020-03-22 ENCOUNTER — Telehealth: Payer: Self-pay | Admitting: Hematology

## 2020-03-22 NOTE — Telephone Encounter (Signed)
Rescheduled 07/26 appointments to 08/04. Patient has been called and notified.

## 2020-03-28 ENCOUNTER — Telehealth: Payer: Self-pay | Admitting: Hematology

## 2020-03-28 NOTE — Telephone Encounter (Signed)
Rescheduled 07/26 to 08/04, patient has been called and notified.

## 2020-04-01 ENCOUNTER — Other Ambulatory Visit: Payer: Medicare PPO

## 2020-04-01 ENCOUNTER — Ambulatory Visit: Payer: Medicare PPO | Admitting: Hematology

## 2020-04-04 DIAGNOSIS — H35723 Serous detachment of retinal pigment epithelium, bilateral: Secondary | ICD-10-CM | POA: Diagnosis not present

## 2020-04-04 DIAGNOSIS — H353231 Exudative age-related macular degeneration, bilateral, with active choroidal neovascularization: Secondary | ICD-10-CM | POA: Diagnosis not present

## 2020-04-04 DIAGNOSIS — Z961 Presence of intraocular lens: Secondary | ICD-10-CM | POA: Diagnosis not present

## 2020-04-10 ENCOUNTER — Other Ambulatory Visit: Payer: Self-pay

## 2020-04-10 ENCOUNTER — Inpatient Hospital Stay: Payer: Medicare PPO | Attending: Hematology

## 2020-04-10 ENCOUNTER — Inpatient Hospital Stay: Payer: Medicare PPO | Admitting: Hematology

## 2020-04-10 DIAGNOSIS — I2510 Atherosclerotic heart disease of native coronary artery without angina pectoris: Secondary | ICD-10-CM | POA: Insufficient documentation

## 2020-04-10 DIAGNOSIS — C8330 Diffuse large B-cell lymphoma, unspecified site: Secondary | ICD-10-CM

## 2020-04-10 DIAGNOSIS — Z87891 Personal history of nicotine dependence: Secondary | ICD-10-CM | POA: Insufficient documentation

## 2020-04-10 DIAGNOSIS — E039 Hypothyroidism, unspecified: Secondary | ICD-10-CM | POA: Diagnosis not present

## 2020-04-10 DIAGNOSIS — M1990 Unspecified osteoarthritis, unspecified site: Secondary | ICD-10-CM | POA: Diagnosis not present

## 2020-04-10 DIAGNOSIS — Z79899 Other long term (current) drug therapy: Secondary | ICD-10-CM | POA: Insufficient documentation

## 2020-04-10 DIAGNOSIS — I10 Essential (primary) hypertension: Secondary | ICD-10-CM | POA: Insufficient documentation

## 2020-04-10 LAB — CMP (CANCER CENTER ONLY)
ALT: 14 U/L (ref 0–44)
AST: 16 U/L (ref 15–41)
Albumin: 3.5 g/dL (ref 3.5–5.0)
Alkaline Phosphatase: 93 U/L (ref 38–126)
Anion gap: 8 (ref 5–15)
BUN: 23 mg/dL (ref 8–23)
CO2: 24 mmol/L (ref 22–32)
Calcium: 9.9 mg/dL (ref 8.9–10.3)
Chloride: 110 mmol/L (ref 98–111)
Creatinine: 0.86 mg/dL (ref 0.61–1.24)
GFR, Est AFR Am: >60 mL/min (ref >60)
GFR, Estimated: >60 mL/min (ref >60)
Glucose, Bld: 108 mg/dL — ABNORMAL HIGH (ref 70–99)
Potassium: 4 mmol/L (ref 3.5–5.1)
Sodium: 142 mmol/L (ref 135–145)
Total Bilirubin: 0.3 mg/dL (ref 0.3–1.2)
Total Protein: 6.8 g/dL (ref 6.5–8.1)

## 2020-04-10 LAB — CBC WITH DIFFERENTIAL/PLATELET
Abs Immature Granulocytes: 0.02 10*3/uL (ref 0.00–0.07)
Basophils Absolute: 0 10*3/uL (ref 0.0–0.1)
Basophils Relative: 1 %
Eosinophils Absolute: 0.1 10*3/uL (ref 0.0–0.5)
Eosinophils Relative: 1 %
HCT: 35.3 % — ABNORMAL LOW (ref 39.0–52.0)
Hemoglobin: 12.2 g/dL — ABNORMAL LOW (ref 13.0–17.0)
Immature Granulocytes: 0 %
Lymphocytes Relative: 21 %
Lymphs Abs: 1.3 10*3/uL (ref 0.7–4.0)
MCH: 30.9 pg (ref 26.0–34.0)
MCHC: 34.6 g/dL (ref 30.0–36.0)
MCV: 89.4 fL (ref 80.0–100.0)
Monocytes Absolute: 0.5 10*3/uL (ref 0.1–1.0)
Monocytes Relative: 8 %
Neutro Abs: 4.1 10*3/uL (ref 1.7–7.7)
Neutrophils Relative %: 69 %
Platelets: 188 10*3/uL (ref 150–400)
RBC: 3.95 MIL/uL — ABNORMAL LOW (ref 4.22–5.81)
RDW: 13.2 % (ref 11.5–15.5)
WBC: 6 10*3/uL (ref 4.0–10.5)
nRBC: 0 % (ref 0.0–0.2)

## 2020-04-10 LAB — LACTATE DEHYDROGENASE: LDH: 155 U/L (ref 98–192)

## 2020-04-10 NOTE — Progress Notes (Signed)
HEMATOLOGY/ONCOLOGY CLINIC NOTE  Date of Service: 04/10/2020  Patient Care Team: Marin Olp, MD as PCP - General (Family Medicine) Brunetta Genera, MD as Consulting Physician (Hematology) Justice Britain, MD as Consulting Physician (Orthopedic Surgery) Armandina Gemma, MD as Consulting Physician (General Surgery) Zadie Rhine Clent Demark, MD as Consulting Physician (Ophthalmology) Suella Broad, MD as Consulting Physician (Physical Medicine and Rehabilitation) Zebedee Iba., MD as Referring Physician (Ophthalmology) Gaynelle Arabian, MD as Consulting Physician (Orthopedic Surgery)  CHIEF COMPLAINTS/PURPOSE OF CONSULTATION:  Diffuse Large B-Cell Lymphoma  HISTORY OF PRESENTING ILLNESS:   Jay Webster is a wonderful 86 y.o. male who has been referred to [place] by surgeon Dr. Armandina Gemma for evaluation and management of Diffuse Large B-Cell Lymphoma. The pt reports that he is doing well overall.   Of note prior to the patient's visit today, pt has had Perineum biopsy completed on 03/11/18 with results revealing Diffuse Large B-Cell Lymphoma. The pt had surgery to remove his irregularly shaped mass in the left anterior portion of the perineum with Dr. Armandina Gemma.  The pt reports first noticing the mass in his left buttock about 2 months ago, after a visit with Dr. Trena Platt. The pt decided to consult his surgeon Dr. Armandina Gemma whom he previously had a thyroidectomy with. The pt notes that the spot became as large as a golf ball and did not breach the skin. The pt notes that it wasn't terribly uncomfortable to sit on. He denies feeling any differently recently than in the past  6 months to a year.   The pt notes that he has been having some continued discharge and is using Depends. He is changing his gauze twice each day.   He denies any recent medical problems. He continues to function independently at home and lives alone, as his partner of 30+ years died in the past year. He notes  that he does not have any family members or friends who he would want to include in his care. He denies any difficulties functioning day to day and denies concerns for his memory.   He has been on thyroid replacement for the last 10 years following a thyroidectomy after thyroid cancer, and was treated with one dose of radioactive iodine.   The pt also notes a uric acid kidney stone history which hasn't been a problem in many years.    Most recent lab results (02/28/18) of CBC  is as follows: all values are WNL except for HCT at 38.3.  On review of systems, pt reports left buttock surgical wound, ganglion cyst on right wrist, and denies fevers, chills, night sweats, unexpected weight loss, new fatigue, pain along the spine, leg swelling, noticing any new lumps or bumps, and any other symptoms.   On PMHx the pt denies heart problems, strokes, abdominal problems, lung problems.  On Social Hx the pt reports that he quit smoking in 1990, and used to smoke 1-2 packs of cigarettes each day before this. He denies excess alcohol being a problem, and consumes one glass of wine rarely. 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  On Family Hx the pt reports brother died of sarcoma, different brother with Type I DM, and denies other cancer or blood disorders.  Interval History:   Jay Webster returns today for management and evaluation of his Diffuse Large B-Cell Lymphoma. The patient's last visit with [place] was on 01/01/2020. The pt reports that he  is doing well overall.  The pt reports that he is having more issues with his memory. He tries to stay active and take care of his home. Pt is feeling very lonely and does not see the value in continuing his life. Pt is not interested in connecting with others in the community.   Lab results today (04/10/20) of CBC w/diff and CMP is as follows: all values are WNL except for RBC at 3.95, Hgb at 12.2, HCT at 35.3, Glucose at  108. 04/10/2020 LDH at 155  On review of systems, pt reports memory issues and denies fevers, chills, new lumps/bumps, night sweats, fatigue, abdominal pain, leg swelling and any other symptoms.   MEDICAL HISTORY:   Past Medical History:  Diagnosis Date  . Arthritis    OA AND PAIN LEFT HIP AND OTHER JOINT PAINS  . Cancer (HCC)    THYROID CANCER - HAD THYROID REMOVED  . History of kidney stones   . History of shingles    RESOLVED  . Hypertension   . Hypothyroidism   . Low back pain   . Macular degeneration of both eyes   . RBBB (right bundle branch block with left anterior fascicular block)   . Seasonal allergies   . Thyroid disease   . Uric acid renal calculus 06/20/2007   Qualifier: Diagnosis of  By: Rogue Bussing CMA, Maryann Alar      SURGICAL HISTORY: Past Surgical History:  Procedure Laterality Date  . APPENDECTOMY  1948  . CATARACT EXTRACTION Right 08/27/2019  . COLONOSCOPY    . Elbow Radical Reduction  1973  . IR IMAGING GUIDED PORT INSERTION  05/05/2018  . IR REMOVAL TUN ACCESS W/ PORT W/O FL MOD SED  10/18/2018  . [place]  . MASS EXCISION N/A 03/11/2018   Procedure: EXCISION MASS OF PERINEUM;  Surgeon: Armandina Gemma, MD;  Location: [facility];  Service: General;  Laterality: N/A;  . REMOVAL OF FIRST RIB   NOV 1994   FOR COMPRESSED VEIN BETWEEN 1 ST RIB AND COLLARBONE  . Rib removed, 1st  1994  . ROTATOR CUFF REPAIR  2009  . THYROIDECTOMY  2010  . TOTAL HIP ARTHROPLASTY Left 05/31/2013   Procedure: LEFT TOTAL HIP ARTHROPLASTY ANTERIOR APPROACH;  Surgeon: Gearlean Alf, MD;  Location: [facility];  Service: Orthopedics;  Laterality: Left;    SOCIAL HISTORY: Social History   Socioeconomic History  . Marital status: Widowed    Spouse name: Not on file  . Number of children: Not on file  . Years of education: Not on file  . Highest education level: Not on file  Occupational History  . Occupation: retired  Tobacco Use  . Smoking status: Former  Smoker    Types: Cigarettes    Quit date: 02/22/1979    Years since quitting: 41.1  . Smokeless tobacco: Never Used  Vaping Use  . Vaping Use: Never used  Substance and Sexual Activity  . Alcohol use: Yes    Alcohol/week: 0.0 - 1.0 standard drinks  . Drug use: No  . Sexual activity: Not Currently  Other Topics Concern  . Not on file  Social History Narrative   Lives alone and 1 cat. Divorced from mongomous long term partner and eventual husband. No children from prior relationships.       Retired Geologist, engineering: socializing, used to play tennis   Social Determinants of Radio broadcast assistant Strain:   . Difficulty  of Paying Living Expenses:   Food Insecurity:   . Worried About Charity fundraiser in the Last Year:   . Arboriculturist in the Last Year:   Transportation Needs:   . Film/video editor (Medical):   Marland Kitchen Lack of Transportation (Non-Medical):   Physical Activity:   . Days of Exercise per Week:   . Minutes of Exercise per Session:   Stress:   . Feeling of Stress :   Social Connections:   . Frequency of Communication with Friends and Family:   . Frequency of Social Gatherings with Friends and Family:   . Attends Religious Services:   . Active Member of Clubs or Organizations:   . Attends Archivist Meetings:   Marland Kitchen Marital Status:   Intimate Partner Violence:   . Fear of Current or Ex-Partner:   . Emotionally Abused:   Marland Kitchen Physically Abused:   . Sexually Abused:     FAMILY HISTORY: Family History  Problem Relation Age of Onset  . Heart disease Mother        passed in 27s  . Diabetes Mother   . Alcohol abuse Father        passed from this  . Parkinson's disease Sister   . Diabetes Brother   . Dementia Sister   . Cancer Brother        sounds like bone cancer- non healing knee injury    ALLERGIES:  is allergic to penicillins, shingrix [zoster vac recomb adjuvanted], and zoloft [sertraline hcl].  MEDICATIONS:    Current Outpatient Medications  Medication Sig Dispense Refill  . acetaminophen (TYLENOL) 325 MG tablet Take 325-650 mg by mouth every 6 (six) hours as needed for headache.     . Aflibercept (EYLEA) 2 MG/0.05ML SOLN Inject 1 Dose into the eye every 8 (eight) weeks.     Marland Kitchen allopurinol (ZYLOPRIM) 300 MG tablet TAKE 1 TABLET EVERY DAY 90 tablet 3  . benazepril (LOTENSIN) 20 MG tablet TAKE 1 TABLET EVERY DAY 90 tablet 3  . cetirizine (ZYRTEC) 10 MG tablet Take 10 mg by mouth daily as needed for allergies.    . fluticasone (FLONASE) 50 MCG/ACT nasal spray Place 2 sprays into both nostrils daily. 16 g 0  . Glucosamine-Chondroitin (OSTEO BI-FLEX REGULAR STRENGTH PO) Take 1 tablet by mouth 2 (two) times daily.    Marland Kitchen levothyroxine (SYNTHROID) 125 MCG tablet Take 1 tablet (125 mcg total) by mouth daily. 90 tablet 3  . Multiple Vitamin (MULTIVITAMIN) tablet Take 1 tablet by mouth daily.    . Multiple Vitamins-Minerals (PRESERVISION AREDS 2 PO) Take 1 tablet by mouth 2 (two) times daily.    Vladimir Faster Glycol-Propyl Glycol (SYSTANE OP) Place 1 drop into both eyes daily as needed (irritation).    . sodium chloride (OCEAN) 0.65 % SOLN nasal spray Place 1 spray into both nostrils as needed for congestion.     No current facility-administered medications for this visit.    REVIEW OF SYSTEMS:   A 10+ POINT REVIEW OF SYSTEMS WAS OBTAINED including neurology, dermatology, psychiatry, cardiac, respiratory, lymph, extremities, GI, GU, Musculoskeletal, constitutional, breasts, reproductive, HEENT.  All pertinent positives are noted in the HPI.  All others are negative.   PHYSICAL EXAMINATION: ECOG FS:2 - Symptomatic, <50% confined to bed  There were no vitals filed for this visit. Wt Readings from Last 3 Encounters:  02/19/20 176 lb (79.8 kg)  01/01/20 180 lb 8 oz (81.9 kg)  10/02/19 173 lb 6.4 oz (78.7  kg)   There is no height or weight on file to calculate BMI.    Exam was given in a chair    GENERAL:alert, in no acute distress and comfortable SKIN: no acute rashes, no significant lesions EYES: conjunctiva are pink and non-injected, sclera anicteric OROPHARYNX: MMM, no exudates, no oropharyngeal erythema or ulceration NECK: supple, no JVD LYMPH:  no palpable lymphadenopathy in the cervical, axillary or inguinal regions LUNGS: clear to auscultation b/l with normal respiratory effort HEART: regular rate & rhythm ABDOMEN:  normoactive bowel sounds , non tender, not distended. No palpable hepatosplenomegaly.  Extremity: no pedal edema PSYCH: alert & oriented x 3 with fluent speech NEURO: no focal motor/sensory deficits  LABORATORY DATA:  I have reviewed the data as listed  . CBC Latest Ref Rng & Units 04/10/2020 02/19/2020 01/01/2020  WBC 4.0 - 10.5 K/uL 6.0 5.8 5.1  Hemoglobin 13.0 - 17.0 g/dL 12.2(L) 12.8(L) 12.3(L)  Hematocrit 39 - 52 % 35.3(L) 37.0(L) 37.0(L)  Platelets 150 - 400 K/uL 188 203.0 168   . CBC    Component Value Date/Time   WBC 6.0 04/10/2020 1434   RBC 3.95 (L) 04/10/2020 1434   HGB 12.2 (L) 04/10/2020 1434   HGB 10.4 (L) 08/02/2018 0828   HCT 35.3 (L) 04/10/2020 1434   PLT 188 04/10/2020 1434   PLT 260 08/02/2018 0828   MCV 89.4 04/10/2020 1434   MCH 30.9 04/10/2020 1434   MCHC 34.6 04/10/2020 1434   RDW 13.2 04/10/2020 1434   LYMPHSABS 1.3 04/10/2020 1434   MONOABS 0.5 04/10/2020 1434   EOSABS 0.1 04/10/2020 1434   BASOSABS 0.0 04/10/2020 1434    CMP Latest Ref Rng & Units 04/10/2020 02/19/2020 01/01/2020  Glucose 70 - 99 mg/dL 108(H) 90 101(H)  BUN 8 - 23 mg/dL 23 17 19   Creatinine 0.61 - 1.24 mg/dL 0.86 0.77 0.85  Sodium 135 - 145 mmol/L 142 138 142  Potassium 3.5 - 5.1 mmol/L 4.0 4.1 4.2  Chloride 98 - 111 mmol/L 110 104 108  CO2 22 - 32 mmol/L 24 27 23   Calcium 8.9 - 10.3 mg/dL 9.9 9.1 8.6(L)  Total Protein 6.5 - 8.1 g/dL 6.8 6.6 6.6  Total Bilirubin 0.3 - 1.2 mg/dL 0.3 0.5 0.3  Alkaline Phos 38 - 126 U/L 93 80 94  AST 15 - 41 U/L 16  16 18   ALT 0 - 44 U/L 14 14 19    . Lab Results  Component Value Date   LDH 155 04/10/2020   Component     Latest Ref Rng & Units 03/24/2018  LDH     98 - 192 U/L 185  HCV Ab     0.0 - 0.9 s/co ratio 0.2  Hep B Core Ab, Tot     Negative Negative  Hepatitis B Surface Ag     Negative Negative  HIV Screen 4th Generation wRfx     Non Reactive Non Reactive    03/11/18 Perineum Bx:    RADIOGRAPHIC STUDIES: I have personally reviewed the radiological images as listed and agreed with the findings in the report. No results found.  ASSESSMENT & PLAN:   86 y.o. male with  1. Diffuse Large B-Cell Lymphoma -newly diagnosed presenting as a mass in the left medial gluteal region/perineum s/p resection. PLAN -03/11/18 Surgical bx which revealed Diffuse Large B-Cell Non-Hodgkin's Lymphoma  -04/13/18 ECHO revealed LV EF of 60-65%  -No Hep B, Hep C or HIV from 03/24/18 labs -04/18/18 PET/CT which revealed Mildly hypermetabolic small  to borderline enlarged abdominal and pelvic retroperitoneal lymph nodes. Probable mild hypermetabolism within subcentimeter axillary lymph nodes, with misregistration on fused images. 2. Scattered lucent lesions in the iliac wings and right fifth rib. Continued attention on follow-up exams is warranted. 3. Scattered small pulmonary nodules are nonspecific and too small for PET resolution. Continued attention on follow-up exams is warranted. 4. Intermediate density lesion deep to the left gluteal fold, with associated hypermetabolism, which may represent a complex cyst or abscess. 5. Aortic atherosclerosis. Coronary artery calcification. 6. Left renal stone. -Treatment plan include R-CHOP for 6 cycles starting 05/10/18   07/11/18 PET/CT revealed No evidence of recurrent lymphoma. 2. Left renal stone. 3. Aortic atherosclerosis. Coronary artery calcification.   S/p 6 cycles of R-CHOP completed on 08/24/18  09/22/18 PET/CT revealed No evidence of hypermetabolic active lymphoma.  2. Aortic atherosclerosis, coronary artery atherosclerosis and emphysema. 3. Left nephrolithiasis. 4. Sinus disease.  2. Memory issues ? MCI vs dementia  PLAN:  -Discussed pt labwork today, 04/10/20; blood counts and chemistries look good, LDH is WNL.  -No lab or clinical evidence of DLBCL recurrence/progression at this time.  -Pt nearly 2 years out from last treatment - will likely space out f/u after next visit.  -Discussed getting pt at-home assistance or moving into an assisted-living facility. Advised pt that these services would help take care of tasks that he has difficulty with due to memory issues. Offered a re-referral to our Education officer, museum, Edwyna Shell - pt is not interested.  -Discussed activities within the community for patient to participate in where he could receive some social interaction.  -Will refer to Neurology for evaluation of memory issues for evaluation and management. -Will see back in 3 months with labs    2.  Patient Active Problem List   Diagnosis Date Noted  . Pulmonary emphysema, unspecified emphysema type ([place]) 02/19/2020  . Atherosclerosis of aorta ([place]) 02/19/2020  . Memory loss 02/19/2020  . Former smoker 08/16/2019  . Depression, major, single episode, mild ([place]) 08/16/2019  . Diffuse large B-cell lymphoma ([place]) 03/30/2018  . Degeneration of lumbar intervertebral disc 10/05/2017  . History of total hip arthroplasty 10/05/2017  . OA (osteoarthritis) of hip 05/31/2013  . History of thyroid cancer 04/21/2011  . Macular degeneration 01/05/2011  . HYPOTHYROIDISM, POSTSURGICAL 11/20/2008  . Osteoarthritis 07/01/2007  . Uric acid renal calculus 06/20/2007  . Essential hypertension 06/20/2007  -continue f/u with PCP for mx of other chronic medical issues.   FOLLOW UP: RTC with Dr Irene Limbo with labs in 3 months Neurology referral for memory issues SW consultation    The total time spent in the appt was 20 minutes and more than 50% was on counseling  and direct patient cares.  All of the patient's questions were answered with apparent satisfaction. The patient knows to call the clinic with any problems, questions or concerns.   Sullivan Lone MD [place] AAHIVMS [facility] Hematology/Oncology Physician [facility]  (Office):       [phone] (Work cell):  [phone] (Fax):           [phone]  04/10/2020 5:01 PM  I, Yevette Edwards, am acting as a scribe for Dr. Sullivan Lone.   .I have reviewed the above documentation for accuracy and completeness, and I agree with the above. Brunetta Genera MD

## 2020-04-12 ENCOUNTER — Telehealth: Payer: Self-pay | Admitting: Hematology

## 2020-04-12 NOTE — Telephone Encounter (Signed)
08/04 los, patient has been called and notified. Calender will be mailed.

## 2020-04-16 ENCOUNTER — Encounter: Payer: Self-pay | Admitting: Neurology

## 2020-05-23 DIAGNOSIS — H353231 Exudative age-related macular degeneration, bilateral, with active choroidal neovascularization: Secondary | ICD-10-CM | POA: Diagnosis not present

## 2020-05-23 DIAGNOSIS — Z961 Presence of intraocular lens: Secondary | ICD-10-CM | POA: Diagnosis not present

## 2020-05-23 DIAGNOSIS — H35723 Serous detachment of retinal pigment epithelium, bilateral: Secondary | ICD-10-CM | POA: Diagnosis not present

## 2020-05-27 ENCOUNTER — Ambulatory Visit (INDEPENDENT_AMBULATORY_CARE_PROVIDER_SITE_OTHER): Payer: Medicare PPO

## 2020-05-27 VITALS — Wt 176.0 lb

## 2020-05-27 DIAGNOSIS — Z0000 Encounter for general adult medical examination without abnormal findings: Secondary | ICD-10-CM | POA: Diagnosis not present

## 2020-05-27 NOTE — Patient Instructions (Addendum)
[name] , Thank you for taking time to come for your Medicare Wellness Visit. I appreciate your ongoing commitment to your health goals. Please review the following plan we discussed and let me know if I can assist you in the future.   Screening recommendations/referrals: Colonoscopy: No longer required Recommended yearly ophthalmology/optometry visit for glaucoma screening and checkup Recommended yearly dental visit for hygiene and checkup  Vaccinations: Influenza vaccine: Up to date Pneumococcal vaccine: Up to date Tdap vaccine: Up to date Shingles vaccine: Completed 05/05/19   Covid-19: Completed 3/11 & 12/11/19  Advanced directives: Copies in chart  Conditions/risks identified: Stay healthy  Next appointment: Follow up in one year for your annual wellness visit.   Preventive Care 84 Years and Older, Male Preventive care refers to lifestyle choices and visits with your health care provider that can promote health and wellness. What does preventive care include?  A yearly physical exam. This is also called an annual well check.  Dental exams once or twice a year.  Routine eye exams. Ask your health care provider how often you should have your eyes checked.  Personal lifestyle choices, including:  Daily care of your teeth and gums.  Regular physical activity.  Eating a healthy diet.  Avoiding tobacco and drug use.  Limiting alcohol use.  Practicing safe sex.  Taking low doses of aspirin every day.  Taking vitamin and mineral supplements as recommended by your health care provider. What happens during an annual well check? The services and screenings done by your health care provider during your annual well check will depend on your age, overall health, lifestyle risk factors, and family history of disease. Counseling  Your health care provider may ask you questions about your:  Alcohol use.  Tobacco use.  Drug use.  Emotional well-being.  Home and  relationship well-being.  Sexual activity.  Eating habits.  History of falls.  Memory and ability to understand (cognition).  Work and work Statistician. Screening  You may have the following tests or measurements:  Height, weight, and BMI.  Blood pressure.  Lipid and cholesterol levels. These may be checked every 5 years, or more frequently if you are over 71 years old.  Skin check.  Lung cancer screening. You may have this screening every year starting at age 70 if you have a 30-pack-year history of smoking and currently smoke or have quit within the past 15 years.  Fecal occult blood test (FOBT) of the stool. You may have this test every year starting at age 81.  Flexible sigmoidoscopy or colonoscopy. You may have a sigmoidoscopy every 5 years or a colonoscopy every 10 years starting at age 48.  Prostate cancer screening. Recommendations will vary depending on your family history and other risks.  Hepatitis C blood test.  Hepatitis B blood test.  Sexually transmitted disease (STD) testing.  Diabetes screening. This is done by checking your blood sugar (glucose) after you have not eaten for a while (fasting). You may have this done every 1-3 years.  Abdominal aortic aneurysm (AAA) screening. You may need this if you are a current or former smoker.  Osteoporosis. You may be screened starting at age 62 if you are at high risk. Talk with your health care provider about your test results, treatment options, and if necessary, the need for more tests. Vaccines  Your health care provider may recommend certain vaccines, such as:  Influenza vaccine. This is recommended every year.  Tetanus, diphtheria, and acellular pertussis (Tdap, Td)  vaccine. You may need a Td booster every 10 years.  Zoster vaccine. You may need this after age 53.  Pneumococcal 13-valent conjugate (PCV13) vaccine. One dose is recommended after age 75.  Pneumococcal polysaccharide (PPSV23) vaccine. One  dose is recommended after age 35. Talk to your health care provider about which screenings and vaccines you need and how often you need them. This information is not intended to replace advice given to you by your health care provider. Make sure you discuss any questions you have with your health care provider. Document Released: 09/20/2015 Document Revised: 05/13/2016 Document Reviewed: 06/25/2015 Elsevier Interactive Patient Education  2017 [employer] Prevention in the Home Falls can cause injuries. They can happen to people of all ages. There are many things you can do to make your home safe and to help prevent falls. What can I do on the outside of my home?  Regularly fix the edges of walkways and driveways and fix any cracks.  Remove anything that might make you trip as you walk through a door, such as a raised step or threshold.  Trim any bushes or trees on the path to your home.  Use bright outdoor lighting.  Clear any walking paths of anything that might make someone trip, such as rocks or tools.  Regularly check to see if handrails are loose or broken. Make sure that both sides of any steps have handrails.  Any raised decks and porches should have guardrails on the edges.  Have any leaves, snow, or ice cleared regularly.  Use sand or salt on walking paths during winter.  Clean up any spills in your garage right away. This includes oil or grease spills. What can I do in the bathroom?  Use night lights.  Install grab bars by the toilet and in the tub and shower. Do not use towel bars as grab bars.  Use non-skid mats or decals in the tub or shower.  If you need to sit down in the shower, use a plastic, non-slip stool.  Keep the floor dry. Clean up any water that spills on the floor as soon as it happens.  Remove soap buildup in the tub or shower regularly.  Attach bath mats securely with double-sided non-slip rug tape.  Do not have throw rugs and other  things on the floor that can make you trip. What can I do in the bedroom?  Use night lights.  Make sure that you have a light by your bed that is easy to reach.  Do not use any sheets or blankets that are too big for your bed. They should not hang down onto the floor.  Have a firm chair that has side arms. You can use this for support while you get dressed.  Do not have throw rugs and other things on the floor that can make you trip. What can I do in the kitchen?  Clean up any spills right away.  Avoid walking on wet floors.  Keep items that you use a lot in easy-to-reach places.  If you need to reach something above you, use a strong step stool that has a grab bar.  Keep electrical cords out of the way.  Do not use floor polish or wax that makes floors slippery. If you must use wax, use non-skid floor wax.  Do not have throw rugs and other things on the floor that can make you trip. What can I do with my stairs?  Do not leave  any items on the stairs.  Make sure that there are handrails on both sides of the stairs and use them. Fix handrails that are broken or loose. Make sure that handrails are as long as the stairways.  Check any carpeting to make sure that it is firmly attached to the stairs. Fix any carpet that is loose or worn.  Avoid having throw rugs at the top or bottom of the stairs. If you do have throw rugs, attach them to the floor with carpet tape.  Make sure that you have a light switch at the top of the stairs and the bottom of the stairs. If you do not have them, ask someone to add them for you. What else can I do to help prevent falls?  Wear shoes that:  Do not have high heels.  Have rubber bottoms.  Are comfortable and fit you well.  Are closed at the toe. Do not wear sandals.  If you use a stepladder:  Make sure that it is fully opened. Do not climb a closed stepladder.  Make sure that both sides of the stepladder are locked into place.  Ask  someone to hold it for you, if possible.  Clearly mark and make sure that you can see:  Any grab bars or handrails.  First and last steps.  Where the edge of each step is.  Use tools that help you move around (mobility aids) if they are needed. These include:  Canes.  Walkers.  Scooters.  Crutches.  Turn on the lights when you go into a dark area. Replace any light bulbs as soon as they burn out.  Set up your furniture so you have a clear path. Avoid moving your furniture around.  If any of your floors are uneven, fix them.  If there are any pets around you, be aware of where they are.  Review your medicines with your doctor. Some medicines can make you feel dizzy. This can increase your chance of falling. Ask your doctor what other things that you can do to help prevent falls. This information is not intended to replace advice given to you by your health care provider. Make sure you discuss any questions you have with your health care provider. Document Released: 06/20/2009 Document Revised: 01/30/2016 Document Reviewed: 09/28/2014 Elsevier Interactive Patient Education  2017 [employer].

## 2020-05-27 NOTE — Progress Notes (Signed)
Virtual Visit via Telephone Note  I connected with  Euclid Cassetta II on 05/27/20 at  2:30 PM EDT by telephone and verified that I am speaking with the correct person using two identifiers.  Medicare Annual Wellness visit completed telephonically due to Covid-19 pandemic.   Persons participating in this call: This Health Coach and this patient.   Location: Patient: Home Provider: Office   I discussed the limitations, risks, security and privacy concerns of performing an evaluation and management service by telephone and the availability of in person appointments. The patient expressed understanding and agreed to proceed.  Unable to perform video visit due to video visit attempted and failed and/or patient does not have video capability.   Some vital signs may be absent or patient reported.   Willette Brace, LPN    Subjective:   Nehemias Sauceda II is a 86 y.o. male who presents for Medicare Annual/Subsequent preventive examination.  Review of Systems     Cardiac Risk Factors include: hypertension;male gender;obesity (BMI >30kg/m2)     Objective:    Today's Vitals   05/27/20 1409  Weight: 176 lb (79.8 kg)   Body mass index is 30.21 kg/m.  Advanced Directives 05/27/2020 10/18/2018 09/30/2018 09/17/2018 09/08/2018 08/29/2018 08/02/2018  Does Patient Have a Medical Advance Directive? Yes No No No No No Yes  Type of Paramedic of [place];Living will - - - - - -  Does patient want to make changes to medical advance directive? - - - - - - No - Patient declined  Copy of [place] in Chart? Yes - validated most recent copy scanned in chart (See row information) - - - - - -  Would patient like information on creating a medical advance directive? - No - Patient declined - - - No - Patient declined -  Pre-existing out of facility DNR order (yellow form or pink MOST form) - - - - - - -    Current Medications (verified) Outpatient Encounter  Medications as of 05/27/2020  Medication Sig  . acetaminophen (TYLENOL) 325 MG tablet Take 325-650 mg by mouth every 6 (six) hours as needed for headache.   . Aflibercept (EYLEA) 2 MG/0.05ML SOLN Inject 1 Dose into the eye every 8 (eight) weeks.   Marland Kitchen allopurinol (ZYLOPRIM) 300 MG tablet TAKE 1 TABLET EVERY DAY  . benazepril (LOTENSIN) 20 MG tablet TAKE 1 TABLET EVERY DAY  . cetirizine (ZYRTEC) 10 MG tablet Take 10 mg by mouth daily as needed for allergies.  . fluticasone (FLONASE) 50 MCG/ACT nasal spray Place 2 sprays into both nostrils daily.  . Glucosamine-Chondroitin (OSTEO BI-FLEX REGULAR STRENGTH PO) Take 1 tablet by mouth 2 (two) times daily.  Marland Kitchen levothyroxine (SYNTHROID) 125 MCG tablet Take 1 tablet (125 mcg total) by mouth daily.  . Multiple Vitamin (MULTIVITAMIN) tablet Take 1 tablet by mouth daily.  . Multiple Vitamins-Minerals (PRESERVISION AREDS 2 PO) Take 1 tablet by mouth 2 (two) times daily.  Vladimir Faster Glycol-Propyl Glycol (SYSTANE OP) Place 1 drop into both eyes daily as needed (irritation).  . sodium chloride (OCEAN) 0.65 % SOLN nasal spray Place 1 spray into both nostrils as needed for congestion.   No facility-administered encounter medications on file as of 05/27/2020.    Allergies (verified) Penicillins, Shingrix [zoster vac recomb adjuvanted], and Zoloft [sertraline hcl]   History: Past Medical History:  Diagnosis Date  . Arthritis    OA AND PAIN LEFT HIP AND OTHER JOINT PAINS  .  Cancer (HCC)    THYROID CANCER - HAD THYROID REMOVED  . History of kidney stones   . History of shingles    RESOLVED  . Hypertension   . Hypothyroidism   . Low back pain   . Macular degeneration of both eyes   . RBBB (right bundle branch block with left anterior fascicular block)   . Seasonal allergies   . Thyroid disease   . Uric acid renal calculus 06/20/2007   Qualifier: Diagnosis of  By: Rogue Bussing CMA, Maryann Alar     Past Surgical History:  Procedure Laterality Date  .  APPENDECTOMY  1948  . CATARACT EXTRACTION Right 08/27/2019  . COLONOSCOPY    . Elbow Radical Reduction  1973  . IR IMAGING GUIDED PORT INSERTION  05/05/2018  . IR REMOVAL TUN ACCESS W/ PORT W/O FL MOD SED  10/18/2018  . [place]  . MASS EXCISION N/A 03/11/2018   Procedure: EXCISION MASS OF PERINEUM;  Surgeon: Armandina Gemma, MD;  Location: [facility];  Service: General;  Laterality: N/A;  . REMOVAL OF FIRST RIB   NOV 1994   FOR COMPRESSED VEIN BETWEEN 1 ST RIB AND COLLARBONE  . Rib removed, 1st  1994  . ROTATOR CUFF REPAIR  2009  . THYROIDECTOMY  2010  . TOTAL HIP ARTHROPLASTY Left 05/31/2013   Procedure: LEFT TOTAL HIP ARTHROPLASTY ANTERIOR APPROACH;  Surgeon: Gearlean Alf, MD;  Location: [facility];  Service: Orthopedics;  Laterality: Left;   Family History  Problem Relation Age of Onset  . Heart disease Mother        passed in 45s  . Diabetes Mother   . Alcohol abuse Father        passed from this  . Parkinson's disease Sister   . Diabetes Brother   . Dementia Sister   . Cancer Brother        sounds like bone cancer- non healing knee injury   Social History   Socioeconomic History  . Marital status: Widowed    Spouse name: Not on file  . Number of children: Not on file  . Years of education: Not on file  . Highest education level: Not on file  Occupational History  . Occupation: retired  Tobacco Use  . Smoking status: Former Smoker    Types: Cigarettes    Quit date: 02/22/1979    Years since quitting: 41.2  . Smokeless tobacco: Never Used  Vaping Use  . Vaping Use: Never used  Substance and Sexual Activity  . Alcohol use: Yes    Alcohol/week: 0.0 - 1.0 standard drinks  . Drug use: No  . Sexual activity: Not Currently  Other Topics Concern  . Not on file  Social History Narrative   Lives alone and 1 cat. Divorced from mongomous long term partner and eventual husband. No children from prior relationships.       Retired Research scientist (medical): socializing, used to play tennis   Social Determinants of Radio broadcast assistant Strain: [place]   . Difficulty of Paying Living Expenses: Not hard at all  Food Insecurity: No Food Insecurity  . Worried About Charity fundraiser in the Last Year: Never true  . Ran Out of Food in the Last Year: Never true  Transportation Needs: No Transportation Needs  . Lack of Transportation (Medical): No  . Lack of Transportation (Non-Medical): No  Physical Activity: Inactive  . Days of Exercise  per Week: 0 days  . Minutes of Exercise per Session: 0 min  Stress: No Stress Concern Present  . Feeling of Stress : Only a little  Social Connections: Socially Isolated  . Frequency of Communication with Friends and Family: Once a week  . Frequency of Social Gatherings with Friends and Family: Twice a week  . Attends Religious Services: Never  . Active Member of Clubs or Organizations: No  . Attends Archivist Meetings: Never  . Marital Status: Widowed    Tobacco Counseling Counseling given: Not Answered   Clinical Intake:  Pre-visit preparation completed: Yes  Pain : No/denies pain     BMI - recorded: 30.21 Nutritional Status: BMI > 30  Obese Nutritional Risks: None Diabetes: No  How often do you need to have someone help you when you read instructions, pamphlets, or other written materials from your doctor or pharmacy?: 1 - Never  Diabetic?No  Interpreter Needed?: No  Information entered by :: Charlott Rakes, LPN   Activities of Daily Living In your present state of health, do you have any difficulty performing the following activities: 05/27/2020 08/16/2019  Hearing? Y Y  Comment weras hearing aids has hearing aids  Vision? N N  Difficulty concentrating or making decisions? Y N  Comment memory issues at time -  Walking or climbing stairs? N N  Dressing or bathing? N N  Doing errands, shopping? N N  Preparing Food and eating ? N -  Using the Toilet? N  -  In the past six months, have you accidently leaked urine? N -  Do you have problems with loss of bowel control? N -  Managing your Medications? N -  Managing your Finances? N -  Housekeeping or managing your Housekeeping? N -  Some recent data might be hidden    Patient Care Team: Marin Olp, MD as PCP - General (Family Medicine) Brunetta Genera, MD as Consulting Physician (Hematology) Justice Britain, MD as Consulting Physician (Orthopedic Surgery) Armandina Gemma, MD as Consulting Physician (General Surgery) Zadie Rhine Clent Demark, MD as Consulting Physician (Ophthalmology) Suella Broad, MD as Consulting Physician (Physical Medicine and Rehabilitation) Zebedee Iba., MD as Referring Physician (Ophthalmology) Gaynelle Arabian, MD as Consulting Physician (Orthopedic Surgery)  Indicate any recent Medical Services you may have received from other than [facility] providers in the past year (date may be approximate).     Assessment:   This is a routine wellness examination for [place].  Hearing/Vision screen  Hearing Screening   125Hz  250Hz  500Hz  1000Hz  2000Hz  3000Hz  4000Hz  6000Hz  8000Hz   Right ear:           Left ear:           Comments: Pt wears hearing aids both ears  Vision Screening Comments: Follows up with [facility] and also see Dr Nira Conn   Dietary issues and exercise activities discussed: Current Exercise Habits: The patient does not participate in regular exercise at present  Goals    . Patient Stated     Lose 10 to 15lbs if can      Depression Screen PHQ 2/9 Scores 05/27/2020 02/19/2020 08/16/2019 01/17/2018 01/07/2016 01/16/2015 01/22/2014  PHQ - 2 Score 1 0 4 6 0 0 0  PHQ- 9 Score - 1 9 7  - - -    Fall Risk Fall Risk  05/27/2020 08/16/2019 01/17/2018 01/07/2016 01/16/2015  Falls in the past year? 1 0 No No No  Number falls in past yr: 1 0 - - -  Injury with Fall? 1 0 - - -  Comment right arm injury - - - -  Risk for fall due to : Impaired vision;History of  fall(s) - - - -  Follow up Falls prevention discussed - - - -    Any stairs in or around the home? Yes  If so, are there any without handrails? No  Home free of loose throw rugs in walkways, pet beds, electrical cords, etc? Yes  Adequate lighting in your home to reduce risk of falls? Yes   ASSISTIVE DEVICES UTILIZED TO PREVENT FALLS:  Life alert? No  Use of a cane, walker or w/c? No  Grab bars in the bathroom? No  Shower chair or bench in shower? Yes Elevated toilet seat or a handicapped toilet? No TIMED UP AND GO:  Was the test performed? No .      Cognitive Function:     6CIT Screen 05/27/2020  What Year? 0 points  What month? 0 points  Count back from 20 0 points  Months in reverse 2 points  Repeat phrase 10 points    Immunizations Immunization History  Administered Date(s) Administered  . Influenza Whole 06/22/2000, 07/01/2007, 05/17/2009  . Influenza, High Dose Seasonal PF 05/15/2015, 06/03/2018, 05/03/2019  . Influenza,inj,Quad PF,6+ Mos 05/17/2013  . Influenza-Unspecified 04/27/2014, 04/09/2015, 05/12/2016, 05/25/2017, 06/04/2017, 05/22/2018, 05/03/2019  . PFIZER SARS-COV-2 Vaccination 11/16/2019, 12/11/2019  . Pneumococcal Conjugate-13 10/02/2014  . Pneumococcal Polysaccharide-23 09/07/2006, 01/02/2019, 05/03/2019  . Td 09/07/2005  . Tdap 01/07/2016  . Zoster 01/05/2011  . Zoster Recombinat (Shingrix) 05/05/2019    TDAP status: Up to date Flu Vaccine status: Up to date Pneumococcal vaccine status: Up to date Covid-19 vaccine status: Completed vaccines  Qualifies for Shingles Vaccine? Yes   Zostavax completed Yes   Shingrix Completed?: Yes  Screening Tests Health Maintenance  Topic Date Due  . INFLUENZA VACCINE  04/07/2020  . TETANUS/TDAP  01/06/2026  . COVID-19 Vaccine  Completed  . PNA vac Low Risk Adult  Completed    Health Maintenance  Health Maintenance Due  Topic Date Due  . INFLUENZA VACCINE  04/07/2020    Colorectal cancer  screening: No longer required.     Additional Screening:    Vision Screening: Recommended annual ophthalmology exams for early detection of glaucoma and other disorders of the eye. Is the patient up to date with their annual eye exam?  Yes  Who is the provider or what is the name of the office in which the patient attends annual eye exams? Dr Nira Conn and [facility]    Dental Screening: Recommended annual dental exams for proper oral hygiene  Community Resource Referral / Chronic Care Management: CRR required this visit?  No   CCM required this visit?  No      Plan:     I have personally reviewed and noted the following in the patient's chart:   . Medical and social history . Use of alcohol, tobacco or illicit drugs  . Current medications and supplements . Functional ability and status . Nutritional status . Physical activity . Advanced directives . List of other physicians . Hospitalizations, surgeries, and ER visits in previous 12 months . Vitals . Screenings to include cognitive, depression, and falls . Referrals and appointments  In addition, I have reviewed and discussed with patient certain preventive protocols, quality metrics, and best practice recommendations. A written personalized care plan for preventive services as well as general preventive health recommendations were provided to patient.  Willette Brace, LPN   04/01/3663   Nurse Notes: None

## 2020-07-10 ENCOUNTER — Other Ambulatory Visit: Payer: Self-pay

## 2020-07-10 ENCOUNTER — Inpatient Hospital Stay: Payer: Medicare PPO | Admitting: Hematology

## 2020-07-10 ENCOUNTER — Inpatient Hospital Stay: Payer: Medicare PPO | Attending: Hematology

## 2020-07-10 VITALS — BP 109/90 | HR 87 | Temp 97.7°F | Resp 18 | Ht 64.0 in | Wt 183.3 lb

## 2020-07-10 DIAGNOSIS — I10 Essential (primary) hypertension: Secondary | ICD-10-CM | POA: Diagnosis not present

## 2020-07-10 DIAGNOSIS — Z79899 Other long term (current) drug therapy: Secondary | ICD-10-CM | POA: Insufficient documentation

## 2020-07-10 DIAGNOSIS — C8330 Diffuse large B-cell lymphoma, unspecified site: Secondary | ICD-10-CM

## 2020-07-10 DIAGNOSIS — E785 Hyperlipidemia, unspecified: Secondary | ICD-10-CM | POA: Diagnosis not present

## 2020-07-10 DIAGNOSIS — I4510 Unspecified right bundle-branch block: Secondary | ICD-10-CM | POA: Diagnosis not present

## 2020-07-10 DIAGNOSIS — M1990 Unspecified osteoarthritis, unspecified site: Secondary | ICD-10-CM | POA: Insufficient documentation

## 2020-07-10 DIAGNOSIS — Z87891 Personal history of nicotine dependence: Secondary | ICD-10-CM | POA: Insufficient documentation

## 2020-07-10 LAB — CMP (CANCER CENTER ONLY)
ALT: 17 U/L (ref 0–44)
AST: 16 U/L (ref 15–41)
Albumin: 3.5 g/dL (ref 3.5–5.0)
Alkaline Phosphatase: 99 U/L (ref 38–126)
Anion gap: 5 (ref 5–15)
BUN: 19 mg/dL (ref 8–23)
CO2: 29 mmol/L (ref 22–32)
Calcium: 9.2 mg/dL (ref 8.9–10.3)
Chloride: 107 mmol/L (ref 98–111)
Creatinine: 0.81 mg/dL (ref 0.61–1.24)
GFR, Estimated: >60 mL/min (ref >60)
Glucose, Bld: 95 mg/dL (ref 70–99)
Potassium: 4.4 mmol/L (ref 3.5–5.1)
Sodium: 141 mmol/L (ref 135–145)
Total Bilirubin: 0.3 mg/dL (ref 0.3–1.2)
Total Protein: 6.9 g/dL (ref 6.5–8.1)

## 2020-07-10 LAB — CBC WITH DIFFERENTIAL/PLATELET
Abs Immature Granulocytes: 0.02 10*3/uL (ref 0.00–0.07)
Basophils Absolute: 0 10*3/uL (ref 0.0–0.1)
Basophils Relative: 1 %
Eosinophils Absolute: 0.1 10*3/uL (ref 0.0–0.5)
Eosinophils Relative: 2 %
HCT: 35.8 % — ABNORMAL LOW (ref 39.0–52.0)
Hemoglobin: 11.8 g/dL — ABNORMAL LOW (ref 13.0–17.0)
Immature Granulocytes: 0 %
Lymphocytes Relative: 22 %
Lymphs Abs: 1.2 10*3/uL (ref 0.7–4.0)
MCH: 29.8 pg (ref 26.0–34.0)
MCHC: 33 g/dL (ref 30.0–36.0)
MCV: 90.4 fL (ref 80.0–100.0)
Monocytes Absolute: 0.4 10*3/uL (ref 0.1–1.0)
Monocytes Relative: 8 %
Neutro Abs: 3.6 10*3/uL (ref 1.7–7.7)
Neutrophils Relative %: 67 %
Platelets: 192 10*3/uL (ref 150–400)
RBC: 3.96 MIL/uL — ABNORMAL LOW (ref 4.22–5.81)
RDW: 13.3 % (ref 11.5–15.5)
WBC: 5.4 10*3/uL (ref 4.0–10.5)
nRBC: 0 % (ref 0.0–0.2)

## 2020-07-10 LAB — LACTATE DEHYDROGENASE: LDH: 148 U/L (ref 98–192)

## 2020-07-10 NOTE — Progress Notes (Signed)
HEMATOLOGY/ONCOLOGY CLINIC NOTE  Date of Service: 07/10/2020  Patient Care Team: Marin Olp, MD as PCP - General (Family Medicine) Brunetta Genera, MD as Consulting Physician (Hematology) Justice Britain, MD as Consulting Physician (Orthopedic Surgery) Armandina Gemma, MD as Consulting Physician (General Surgery) Zadie Rhine Clent Demark, MD as Consulting Physician (Ophthalmology) Suella Broad, MD as Consulting Physician (Physical Medicine and Rehabilitation) Zebedee Iba., MD as Referring Physician (Ophthalmology) Gaynelle Arabian, MD as Consulting Physician (Orthopedic Surgery)  CHIEF COMPLAINTS/PURPOSE OF CONSULTATION:  Diffuse Large B-Cell Lymphoma  HISTORY OF PRESENTING ILLNESS:   Jay Webster is a wonderful 86 y.o. male who has been referred to [place] by surgeon Dr. Armandina Gemma for evaluation and management of Diffuse Large B-Cell Lymphoma. The pt reports that he is doing well overall.   Of note prior to the patient's visit today, pt has had Perineum biopsy completed on 03/11/18 with results revealing Diffuse Large B-Cell Lymphoma. The pt had surgery to remove his irregularly shaped mass in the left anterior portion of the perineum with Dr. Armandina Gemma.  The pt reports first noticing the mass in his left buttock about 2 months ago, after a visit with Dr. Trena Platt. The pt decided to consult his surgeon Dr. Armandina Gemma whom he previously had a thyroidectomy with. The pt notes that the spot became as large as a golf ball and did not breach the skin. The pt notes that it wasn't terribly uncomfortable to sit on. He denies feeling any differently recently than in the past  6 months to a year.   The pt notes that he has been having some continued discharge and is using Depends. He is changing his gauze twice each day.   He denies any recent medical problems. He continues to function independently at home and lives alone, as his partner of 30+ years died in the past year. He notes  that he does not have any family members or friends who he would want to include in his care. He denies any difficulties functioning day to day and denies concerns for his memory.   He has been on thyroid replacement for the last 10 years following a thyroidectomy after thyroid cancer, and was treated with one dose of radioactive iodine.   The pt also notes a uric acid kidney stone history which hasn't been a problem in many years.    Most recent lab results (02/28/18) of CBC  is as follows: all values are WNL except for HCT at 38.3.  On review of systems, pt reports left buttock surgical wound, ganglion cyst on right wrist, and denies fevers, chills, night sweats, unexpected weight loss, new fatigue, pain along the spine, leg swelling, noticing any new lumps or bumps, and any other symptoms.   On PMHx the pt denies heart problems, strokes, abdominal problems, lung problems.  On Social Hx the pt reports that he quit smoking in 1990, and used to smoke 1-2 packs of cigarettes each day before this. He denies excess alcohol being a problem, and consumes one glass of wine rarely. 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  On Family Hx the pt reports brother died of sarcoma, different brother with Type I DM, and denies other cancer or blood disorders.  Interval History:    Jay Webster returns today for management and evaluation of his Diffuse Large B-Cell Lymphoma. The patient's last visit with [place] was on 04/10/2020. The pt reports that  he is doing well overall.  The pt reports that he experienced significant bruising after his Shingles vaccine. He denies any pain in the area. Pt tolerated his COVID19 booster well. He is having negative changes in his vision and is keen to discontinue driving.   Pt continues to express his sadness and desire to leave this world behind. He continues to deny the possibility of moving into an assisted living facility for more  socialization. Pt does socialize with his neighbor and read to children.  Lab results today (07/10/20) of CBC w/diff and CMP is as follows: all values are WNL except for RBC at 3.96, Hgb at 11.8, HCT at 35.8. 07/10/2020 LDH at 148  On review of systems, pt denies fevers, chills, new lumps/bumps, low appetite, back pain and any other symptoms.   MEDICAL HISTORY:   Past Medical History:  Diagnosis Date  . Arthritis    OA AND PAIN LEFT HIP AND OTHER JOINT PAINS  . Cancer (HCC)    THYROID CANCER - HAD THYROID REMOVED  . History of kidney stones   . History of shingles    RESOLVED  . Hypertension   . Hypothyroidism   . Low back pain   . Macular degeneration of both eyes   . RBBB (right bundle branch block with left anterior fascicular block)   . Seasonal allergies   . Thyroid disease   . Uric acid renal calculus 06/20/2007   Qualifier: Diagnosis of  By: Rogue Bussing CMA, Maryann Alar      SURGICAL HISTORY: Past Surgical History:  Procedure Laterality Date  . APPENDECTOMY  1948  . CATARACT EXTRACTION Right 08/27/2019  . COLONOSCOPY    . Elbow Radical Reduction  1973  . IR IMAGING GUIDED PORT INSERTION  05/05/2018  . IR REMOVAL TUN ACCESS W/ PORT W/O FL MOD SED  10/18/2018  . [place]  . MASS EXCISION N/A 03/11/2018   Procedure: EXCISION MASS OF PERINEUM;  Surgeon: Armandina Gemma, MD;  Location: [facility];  Service: General;  Laterality: N/A;  . REMOVAL OF FIRST RIB   NOV 1994   FOR COMPRESSED VEIN BETWEEN 1 ST RIB AND COLLARBONE  . Rib removed, 1st  1994  . ROTATOR CUFF REPAIR  2009  . THYROIDECTOMY  2010  . TOTAL HIP ARTHROPLASTY Left 05/31/2013   Procedure: LEFT TOTAL HIP ARTHROPLASTY ANTERIOR APPROACH;  Surgeon: Gearlean Alf, MD;  Location: [facility];  Service: Orthopedics;  Laterality: Left;    SOCIAL HISTORY: Social History   Socioeconomic History  . Marital status: Widowed    Spouse name: Not on file  . Number of children: Not on file  . Years of  education: Not on file  . Highest education level: Not on file  Occupational History  . Occupation: retired  Tobacco Use  . Smoking status: Former Smoker    Types: Cigarettes    Quit date: 02/22/1979    Years since quitting: 41.4  . Smokeless tobacco: Never Used  Vaping Use  . Vaping Use: Never used  Substance and Sexual Activity  . Alcohol use: Yes    Alcohol/week: 0.0 - 1.0 standard drinks  . Drug use: No  . Sexual activity: Not Currently  Other Topics Concern  . Not on file  Social History Narrative   Lives alone and 1 cat. Divorced from mongomous long term partner and eventual husband. No children from prior relationships.       Retired Investment banker, corporate  Hobbies: socializing, used to play tennis   Social Determinants of Health   Financial Resource Strain: Low Risk   . Difficulty of Paying Living Expenses: Not hard at all  Food Insecurity: No Food Insecurity  . Worried About Charity fundraiser in the Last Year: Never true  . Ran Out of Food in the Last Year: Never true  Transportation Needs: No Transportation Needs  . Lack of Transportation (Medical): No  . Lack of Transportation (Non-Medical): No  Physical Activity: Inactive  . Days of Exercise per Week: 0 days  . Minutes of Exercise per Session: 0 min  Stress: No Stress Concern Present  . Feeling of Stress : Only a little  Social Connections: Socially Isolated  . Frequency of Communication with Friends and Family: Once a week  . Frequency of Social Gatherings with Friends and Family: Twice a week  . Attends Religious Services: Never  . Active Member of Clubs or Organizations: No  . Attends Archivist Meetings: Never  . Marital Status: Widowed  Intimate Partner Violence: Not At Risk  . Fear of Current or Ex-Partner: No  . Emotionally Abused: No  . Physically Abused: No  . Sexually Abused: No    FAMILY HISTORY: Family History  Problem Relation Age of Onset  . Heart disease Mother         passed in 48s  . Diabetes Mother   . Alcohol abuse Father        passed from this  . Parkinson's disease Sister   . Diabetes Brother   . Dementia Sister   . Cancer Brother        sounds like bone cancer- non healing knee injury    ALLERGIES:  is allergic to penicillins, shingrix [zoster vac recomb adjuvanted], and zoloft [sertraline hcl].  MEDICATIONS:  Current Outpatient Medications  Medication Sig Dispense Refill  . acetaminophen (TYLENOL) 325 MG tablet Take 325-650 mg by mouth every 6 (six) hours as needed for headache.     . Aflibercept (EYLEA) 2 MG/0.05ML SOLN Inject 1 Dose into the eye every 8 (eight) weeks.     Marland Kitchen allopurinol (ZYLOPRIM) 300 MG tablet TAKE 1 TABLET EVERY DAY 90 tablet 3  . benazepril (LOTENSIN) 20 MG tablet TAKE 1 TABLET EVERY DAY 90 tablet 3  . cetirizine (ZYRTEC) 10 MG tablet Take 10 mg by mouth daily as needed for allergies.    . fluticasone (FLONASE) 50 MCG/ACT nasal spray Place 2 sprays into both nostrils daily. 16 g 0  . Glucosamine-Chondroitin (OSTEO BI-FLEX REGULAR STRENGTH PO) Take 1 tablet by mouth 2 (two) times daily.    Marland Kitchen levothyroxine (SYNTHROID) 125 MCG tablet Take 1 tablet (125 mcg total) by mouth daily. 90 tablet 3  . Multiple Vitamin (MULTIVITAMIN) tablet Take 1 tablet by mouth daily.    . Multiple Vitamins-Minerals (PRESERVISION AREDS 2 PO) Take 1 tablet by mouth 2 (two) times daily.    Vladimir Faster Glycol-Propyl Glycol (SYSTANE OP) Place 1 drop into both eyes daily as needed (irritation).    . sodium chloride (OCEAN) 0.65 % SOLN nasal spray Place 1 spray into both nostrils as needed for congestion.     No current facility-administered medications for this visit.    REVIEW OF SYSTEMS:   A 10+ POINT REVIEW OF SYSTEMS WAS OBTAINED including neurology, dermatology, psychiatry, cardiac, respiratory, lymph, extremities, GI, GU, Musculoskeletal, constitutional, breasts, reproductive, HEENT.  All pertinent positives are noted in the HPI.  All others  are negative.  PHYSICAL EXAMINATION: ECOG FS:2 - Symptomatic, <50% confined to bed  Vitals:   07/10/20 1257  BP: 109/90  Pulse: 87  Resp: 18  Temp: 97.7 F (36.5 C)  SpO2: 100%   Wt Readings from Last 3 Encounters:  07/10/20 183 lb 4.8 oz (83.1 kg)  05/27/20 176 lb (79.8 kg)  02/19/20 176 lb (79.8 kg)   Body mass index is 31.46 kg/m.    GENERAL:alert, in no acute distress and comfortable SKIN: no acute rashes, no significant lesions EYES: conjunctiva are pink and non-injected, sclera anicteric OROPHARYNX: MMM, no exudates, no oropharyngeal erythema or ulceration NECK: supple, no JVD LYMPH:  no palpable lymphadenopathy in the cervical, axillary or inguinal regions LUNGS: clear to auscultation b/l with normal respiratory effort HEART: regular rate & rhythm ABDOMEN:  normoactive bowel sounds , non tender, not distended. No palpable hepatosplenomegaly.  Extremity: no pedal edema PSYCH: alert & oriented x 3 with fluent speech NEURO: no focal motor/sensory deficits  LABORATORY DATA:  I have reviewed the data as listed  . CBC Latest Ref Rng & Units 07/10/2020 04/10/2020 02/19/2020  WBC 4.0 - 10.5 K/uL 5.4 6.0 5.8  Hemoglobin 13.0 - 17.0 g/dL 11.8(L) 12.2(L) 12.8(L)  Hematocrit 39 - 52 % 35.8(L) 35.3(L) 37.0(L)  Platelets 150 - 400 K/uL 192 188 203.0   . CBC    Component Value Date/Time   WBC 5.4 07/10/2020 1224   RBC 3.96 (L) 07/10/2020 1224   HGB 11.8 (L) 07/10/2020 1224   HGB 10.4 (L) 08/02/2018 0828   HCT 35.8 (L) 07/10/2020 1224   PLT 192 07/10/2020 1224   PLT 260 08/02/2018 0828   MCV 90.4 07/10/2020 1224   MCH 29.8 07/10/2020 1224   MCHC 33.0 07/10/2020 1224   RDW 13.3 07/10/2020 1224   LYMPHSABS 1.2 07/10/2020 1224   MONOABS 0.4 07/10/2020 1224   EOSABS 0.1 07/10/2020 1224   BASOSABS 0.0 07/10/2020 1224    CMP Latest Ref Rng & Units 07/10/2020 04/10/2020 02/19/2020  Glucose 70 - 99 mg/dL 95 108(H) 90  BUN 8 - 23 mg/dL 19 23 17   Creatinine 0.61 - 1.24  mg/dL 0.81 0.86 0.77  Sodium 135 - 145 mmol/L 141 142 138  Potassium 3.5 - 5.1 mmol/L 4.4 4.0 4.1  Chloride 98 - 111 mmol/L 107 110 104  CO2 22 - 32 mmol/L 29 24 27   Calcium 8.9 - 10.3 mg/dL 9.2 9.9 9.1  Total Protein 6.5 - 8.1 g/dL 6.9 6.8 6.6  Total Bilirubin 0.3 - 1.2 mg/dL 0.3 0.3 0.5  Alkaline Phos 38 - 126 U/L 99 93 80  AST 15 - 41 U/L 16 16 16   ALT 0 - 44 U/L 17 14 14    . Lab Results  Component Value Date   LDH 148 07/10/2020   Component     Latest Ref Rng & Units 03/24/2018  LDH     98 - 192 U/L 185  HCV Ab     0.0 - 0.9 s/co ratio 0.2  Hep B Core Ab, Tot     Negative Negative  Hepatitis B Surface Ag     Negative Negative  HIV Screen 4th Generation wRfx     Non Reactive Non Reactive    03/11/18 Perineum Bx:    RADIOGRAPHIC STUDIES: I have personally reviewed the radiological images as listed and agreed with the findings in the report. No results found.  ASSESSMENT & PLAN:   86 y.o. male with  1. Diffuse Large B-Cell Lymphoma -newly diagnosed presenting as  a mass in the left medial gluteal region/perineum s/p resection. PLAN -03/11/18 Surgical bx which revealed Diffuse Large B-Cell Non-Hodgkin's Lymphoma  -04/13/18 ECHO revealed LV EF of 60-65%  -No Hep B, Hep C or HIV from 03/24/18 labs -04/18/18 PET/CT which revealed Mildly hypermetabolic small to borderline enlarged abdominal and pelvic retroperitoneal lymph nodes. Probable mild hypermetabolism within subcentimeter axillary lymph nodes, with misregistration on fused images. 2. Scattered lucent lesions in the iliac wings and right fifth rib. Continued attention on follow-up exams is warranted. 3. Scattered small pulmonary nodules are nonspecific and too small for PET resolution. Continued attention on follow-up exams is warranted. 4. Intermediate density lesion deep to the left gluteal fold, with associated hypermetabolism, which may represent a complex cyst or abscess. 5. Aortic atherosclerosis. Coronary artery  calcification. 6. Left renal stone. -Treatment plan include R-CHOP for 6 cycles starting 05/10/18   07/11/18 PET/CT revealed No evidence of recurrent lymphoma. 2. Left renal stone. 3. Aortic atherosclerosis. Coronary artery calcification.   S/p 6 cycles of R-CHOP completed on 08/24/18  09/22/18 PET/CT revealed No evidence of hypermetabolic active lymphoma. 2. Aortic atherosclerosis, coronary artery atherosclerosis and emphysema. 3. Left nephrolithiasis. 4. Sinus disease.  2. Memory issues ? MCI vs dementia  PLAN:  -Discussed pt labwork today, 07/10/20; blood counts and chemistries look good, LDH is WNL. -No lab or clinical evidence of DLBCL recurrence at this time. Will continue watchful observation. -Will see back in 6 months with labs    2.  Patient Active Problem List   Diagnosis Date Noted  . Pulmonary emphysema, unspecified emphysema type ([place]) 02/19/2020  . Atherosclerosis of aorta ([place]) 02/19/2020  . Memory loss 02/19/2020  . Former smoker 08/16/2019  . Depression, major, single episode, mild ([place]) 08/16/2019  . Diffuse large B-cell lymphoma ([place]) 03/30/2018  . Degeneration of lumbar intervertebral disc 10/05/2017  . History of total hip arthroplasty 10/05/2017  . OA (osteoarthritis) of hip 05/31/2013  . History of thyroid cancer 04/21/2011  . Macular degeneration 01/05/2011  . HYPOTHYROIDISM, POSTSURGICAL 11/20/2008  . Osteoarthritis 07/01/2007  . Uric acid renal calculus 06/20/2007  . Essential hypertension 06/20/2007  -continue f/u with PCP for mx of other chronic medical issues.   FOLLOW UP: RTC with Dr Irene Limbo with labs in 6 months    The total time spent in the appt was 20 minutes and more than 50% was on counseling and direct patient cares.  All of the patient's questions were answered with apparent satisfaction. The patient knows to call the clinic with any problems, questions or concerns.   Sullivan Lone MD [place] AAHIVMS [facility] Hematology/Oncology Physician [facility]  (Office):       [phone] (Work cell):  [phone] (Fax):           [phone]  07/10/2020 2:25 PM  I, Yevette Edwards, am acting as a scribe for Dr. Sullivan Lone.   .I have reviewed the above documentation for accuracy and completeness, and I agree with the above. Brunetta Genera MD

## 2020-07-18 ENCOUNTER — Ambulatory Visit: Payer: Medicare PPO | Admitting: Neurology

## 2020-07-18 DIAGNOSIS — H35723 Serous detachment of retinal pigment epithelium, bilateral: Secondary | ICD-10-CM | POA: Diagnosis not present

## 2020-07-18 DIAGNOSIS — H353231 Exudative age-related macular degeneration, bilateral, with active choroidal neovascularization: Secondary | ICD-10-CM | POA: Diagnosis not present

## 2020-07-18 DIAGNOSIS — H25813 Combined forms of age-related cataract, bilateral: Secondary | ICD-10-CM | POA: Diagnosis not present

## 2020-07-18 DIAGNOSIS — Z961 Presence of intraocular lens: Secondary | ICD-10-CM | POA: Diagnosis not present

## 2020-08-19 NOTE — Patient Instructions (Addendum)
Please call [facility] back to reschedule  Please call [phone] to schedule a visit with [facility] -Trey Paula is an excellent counselor who is based out of our clinic  Please stop by lab before you go If you have mychart- we will send your results within 3 business days of [place] receiving them.  If you do not have mychart- we will call you about results within 5 business days of [place] receiving them.  *please note we are currently using [facility] which has a longer processing time than [place] typically so labs may not come back as quickly as in the past *please also note that you will see labs on mychart as soon as they post. I will later go in and write notes on them- will say "notes from Dr. Yong Channel"   Recommended follow up (schedule before you leave): Return in about 6 months (around 02/18/2021) for physical or sooner if needed.

## 2020-08-19 NOTE — Progress Notes (Signed)
Phone [phone] In person visit   Subjective:   Jay Webster is a 86 y.o. year old very pleasant male patient who presents for/with See problem oriented charting Chief Complaint  Patient presents with  . Hypertension  . Hyperlipidemia   This visit occurred during the SARS-CoV-2 public health emergency.  Safety protocols were in place, including screening questions prior to the visit, additional usage of staff PPE, and extensive cleaning of exam room while observing appropriate contact time as indicated for disinfecting solutions.   Past Medical History-  Patient Active Problem List   Diagnosis Date Noted  . Memory loss 02/19/2020    Priority: High  . Diffuse large B-cell lymphoma ([place]) 03/30/2018    Priority: High  . Pulmonary emphysema, unspecified emphysema type ([place]) 02/19/2020    Priority: Medium  . Depression, major, single episode, mild ([place]) 08/16/2019    Priority: Medium  . History of total hip arthroplasty 10/05/2017    Priority: Medium  . History of thyroid cancer 04/21/2011    Priority: Medium  . Macular degeneration 01/05/2011    Priority: Medium  . HYPOTHYROIDISM, POSTSURGICAL 11/20/2008    Priority: Medium  . Osteoarthritis 07/01/2007    Priority: Medium  . Uric acid renal calculus 06/20/2007    Priority: Medium  . Essential hypertension 06/20/2007    Priority: Medium  . Former smoker 08/16/2019    Priority: Low  . Degeneration of lumbar intervertebral disc 10/05/2017    Priority: Low  . OA (osteoarthritis) of hip 05/31/2013    Priority: Low  . Atherosclerosis of aorta ([place]) 02/19/2020    Medications- reviewed and updated Current Outpatient Medications  Medication Sig Dispense Refill  . acetaminophen (TYLENOL) 325 MG tablet Take 325-650 mg by mouth every 6 (six) hours as needed for headache.     . Aflibercept 2 MG/0.05ML SOLN Inject 1 Dose into the eye every 8 (eight) weeks.     . cetirizine (ZYRTEC) 10 MG tablet Take 10 mg by mouth daily as  needed for allergies.    . Glucosamine-Chondroitin (OSTEO BI-FLEX REGULAR STRENGTH PO) Take 1 tablet by mouth 2 (two) times daily.    . Multiple Vitamin (MULTIVITAMIN) tablet Take 1 tablet by mouth daily.    . Multiple Vitamins-Minerals (PRESERVISION AREDS 2 PO) Take 1 tablet by mouth 2 (two) times daily.    Vladimir Faster Glycol-Propyl Glycol (SYSTANE OP) Place 1 drop into both eyes daily as needed (irritation).    . sodium chloride (OCEAN) 0.65 % SOLN nasal spray Place 1 spray into both nostrils as needed for congestion.    Marland Kitchen allopurinol (ZYLOPRIM) 300 MG tablet TAKE 1 TABLET EVERY DAY 90 tablet 3  . benazepril (LOTENSIN) 20 MG tablet TAKE 1 TABLET EVERY DAY 90 tablet 3  . fluticasone (FLONASE) 50 MCG/ACT nasal spray Place 2 sprays into both nostrils daily. (Patient not taking: Reported on 08/20/2020) 16 g 0  . levothyroxine (SYNTHROID) 125 MCG tablet Take 1 tablet (125 mcg total) by mouth daily. 90 tablet 3   No current facility-administered medications for this visit.     Objective:  BP 110/72   Pulse 81   Temp 98.2 F (36.8 C) (Temporal)   Ht 5\' 4"  (1.626 m)   Wt 178 lb (80.7 kg)   SpO2 99%   BMI 30.55 kg/m  Gen: NAD, resting comfortably CV: RRR no murmurs rubs or gallops Lungs: CTAB no crackles, wheeze, rhonchi Abdomen: soft/nontender/nondistended/normal bowel sounds. No rebound or guarding.  Ext: trace edema Skin: warm, dry  Assessment and Plan   # Memory loss-patient with continued issues with memory loss. Mom lived to 41 but had lost her memory.  Oncology was concerned about this and placed a referral to neurology. Patient had previously reported mild concern to me and we were planning on an MMSE this visit or next but I think expert consultation is certainly reasonable with neurology - patient ended up cancelling visit- encouraged him to reschedule  #B-cell lymphoma-continues close follow-up with oncology. Had labs just last month. [place] has been low  #hypertension S:  medication: benazepril 20Mg  Home readings #s: 536U systolic/ 44I-34V BP Readings from Last 3 Encounters:  08/20/20 110/72  07/10/20 109/90  02/19/20 130/70  A/P: Excellent control today-continue current medication  # Depression S: Medication:none  Continues to struggle with loneliness after being divorced from his husband.  He does not have suicidal thoughts but instead has thoughts of being ready to die-he has no plans to hurt himself. 1 thing that is really important him is caring for himself-I think neurology visit in clinic next steps in life is very valuable for him.  Last visit he did not want to do counseling-he thought being further out from pandemic may help but obviously were having ongoing issues. Also discussed senior center activities which he declined previously. Few relationships- does have friendship with nextdoor neighbor- having first child- and another neighbor Depression screen [facility] 2/9 08/20/2020 05/27/2020 02/19/2020  Decreased Interest 0 0 0  Down, Depressed, Hopeless 1 1 0  PHQ - 2 Score 1 1 0  Altered sleeping 0 - 0  Tired, decreased energy 0 - 0  Change in appetite 0 - 1  Feeling bad or failure about yourself  0 - 0  Trouble concentrating 0 - 0  Moving slowly or fidgety/restless 0 - 0  Suicidal thoughts 1 - 0  PHQ-9 Score 2 - 1  Difficult doing work/chores Not difficult at all - Not difficult at all  A/P: good control by phq9 but I still think underreporting. Does not want to pursue medicine at this time. He wouldn't want treatment for cancer if recurs. Did seem more open to therapy- given # for [facility]  #hypothyroidism S: compliant On thyroid medication- levothyroxine 114mcg Lab Results  Component Value Date   TSH 1.59 02/19/2020   A/P:Excellent control on last check-update TSH with labs  #Uric acid kidney stones S: no recurrence on allopurinol 300mg  Lab Results  Component Value Date   LABURIC 3.5 (L) 01/17/2018  A/P:Stable. Continue  current medications. Update uric acid   # Hyperglycemia/insulin resistance/prediabetes-patient has had some elevated CBGs in the past. S:  Medication: none Exercise and diet- not the healthiest- a lot of tv dinners No results found for: HGBA1C  A/P: Due to prior elevated CBGs we will get an A1c with labs today.  #hyperlipidemia/aortic atherosclerosis S: Medication: none Lab Results  Component Value Date   CHOL 146 02/19/2020   HDL 63.90 02/19/2020   LDLCALC 69 02/19/2020   TRIG 63.0 02/19/2020   CHOLHDL 2 02/19/2020   A/P: LDL well controlled under 70- continue to monitor  Recommended follow up: Return in about 6 months (around 02/18/2021) for physical or sooner if needed. Future Appointments  Date Time Provider [place]  01/08/2021 10:00 AM [facility] None  01/08/2021 10:40 AM Brunetta Genera, MD [facility] None  06/02/2021  2:30 PM [facility] HEALTH CO[facility]    Lab/Order associations:   ICD-10-CM   1. Essential hypertension  I10  2. HYPOTHYROIDISM, POSTSURGICAL  E89.0 TSH  3. Depression, major, single episode, mild ([place])  F32.0   4. Hyperglycemia  R73.9 Hemoglobin A1c  5. Uric acid renal calculus  N20.0 Uric acid    Meds ordered this encounter  Medications  . allopurinol (ZYLOPRIM) 300 MG tablet    Sig: TAKE 1 TABLET EVERY DAY    Dispense:  90 tablet    Refill:  3  . benazepril (LOTENSIN) 20 MG tablet    Sig: TAKE 1 TABLET EVERY DAY    Dispense:  90 tablet    Refill:  3  . levothyroxine (SYNTHROID) 125 MCG tablet    Sig: Take 1 tablet (125 mcg total) by mouth daily.    Dispense:  90 tablet    Refill:  3   Return precautions advised.  Garret Reddish, MD

## 2020-08-20 ENCOUNTER — Encounter: Payer: Self-pay | Admitting: Family Medicine

## 2020-08-20 ENCOUNTER — Other Ambulatory Visit: Payer: Self-pay

## 2020-08-20 ENCOUNTER — Ambulatory Visit: Payer: Medicare PPO | Admitting: Family Medicine

## 2020-08-20 VITALS — BP 110/72 | HR 81 | Temp 98.2°F | Ht 64.0 in | Wt 178.0 lb

## 2020-08-20 DIAGNOSIS — R739 Hyperglycemia, unspecified: Secondary | ICD-10-CM | POA: Diagnosis not present

## 2020-08-20 DIAGNOSIS — N200 Calculus of kidney: Secondary | ICD-10-CM | POA: Diagnosis not present

## 2020-08-20 DIAGNOSIS — I10 Essential (primary) hypertension: Secondary | ICD-10-CM

## 2020-08-20 DIAGNOSIS — E890 Postprocedural hypothyroidism: Secondary | ICD-10-CM | POA: Diagnosis not present

## 2020-08-20 DIAGNOSIS — F320 Major depressive disorder, single episode, mild: Secondary | ICD-10-CM

## 2020-08-20 MED ORDER — ALLOPURINOL 300 MG PO TABS
ORAL_TABLET | ORAL | 3 refills | Status: DC
Start: 2020-08-20 — End: 2021-08-05

## 2020-08-20 MED ORDER — LEVOTHYROXINE SODIUM 125 MCG PO TABS
125.0000 ug | ORAL_TABLET | Freq: Every day | ORAL | 3 refills | Status: DC
Start: 2020-08-20 — End: 2021-05-14

## 2020-08-20 MED ORDER — BENAZEPRIL HCL 20 MG PO TABS
ORAL_TABLET | ORAL | 3 refills | Status: DC
Start: 2020-08-20 — End: 2021-08-05

## 2020-08-21 LAB — URIC ACID: Uric Acid, Serum: 3.3 mg/dL — ABNORMAL LOW (ref 4.0–8.0)

## 2020-08-21 LAB — HEMOGLOBIN A1C
Hgb A1c MFr Bld: 5.7 % of total Hgb — ABNORMAL HIGH (ref <5.7)
Mean Plasma Glucose: 117 mg/dL
eAG (mmol/L): 6.5 mmol/L

## 2020-08-21 LAB — TSH: TSH: 1.02 mIU/L (ref 0.40–4.50)

## 2020-09-12 DIAGNOSIS — Z961 Presence of intraocular lens: Secondary | ICD-10-CM | POA: Diagnosis not present

## 2020-09-12 DIAGNOSIS — H35723 Serous detachment of retinal pigment epithelium, bilateral: Secondary | ICD-10-CM | POA: Diagnosis not present

## 2020-09-12 DIAGNOSIS — H353231 Exudative age-related macular degeneration, bilateral, with active choroidal neovascularization: Secondary | ICD-10-CM | POA: Diagnosis not present

## 2020-10-24 ENCOUNTER — Encounter: Payer: Self-pay | Admitting: Physician Assistant

## 2020-10-24 ENCOUNTER — Telehealth (INDEPENDENT_AMBULATORY_CARE_PROVIDER_SITE_OTHER): Payer: Medicare PPO | Admitting: Physician Assistant

## 2020-10-24 DIAGNOSIS — J3089 Other allergic rhinitis: Secondary | ICD-10-CM | POA: Diagnosis not present

## 2020-10-24 MED ORDER — AZELASTINE HCL 0.1 % NA SOLN: 2.0000 | Freq: Two times a day (BID) | NASAL | 0 refills | Status: DC

## 2020-10-24 NOTE — Progress Notes (Signed)
Virtual Visit via Telephone Note  I connected with Jay Webster on 10/24/20 at  3:30 PM EST by telephone and verified that I am speaking with the correct person using two identifiers.  Location: Patient: home Provider: [place], office   I discussed the limitations, risks, security and privacy concerns of performing an evaluation and management service by telephone and the availability of in person appointments. I also discussed with the patient that there may be a patient responsible charge related to this service. The patient expressed understanding and agreed to proceed.   History of Present Illness: 87 yo pleasant male for phone visit today about rhinitis worsening x 5 days. He takes Fexofenadine daily and uses Sinex nasal spray OTC. No other symptoms. Denies fevers, chills, HA, cough, body aches. No sick contacts.    Observations/Objective: Sounds congested over the phone. Speaks clearly and intelligibly without pauses or gasps for breaths.  Assessment and Plan: 1. Non-seasonal allergic rhinitis, unspecified trigger Continue use of the Fexofenadine daily, but change the Sinex nasal spray to Astelin as directed. Use humidifier in room. Drink plenty of water. Call if worse or no improvement.    Follow Up Instructions:    I discussed the assessment and treatment plan with the patient. The patient was provided an opportunity to ask questions and all were answered. The patient agreed with the plan and demonstrated an understanding of the instructions.   The patient was advised to call back or seek an in-person evaluation if the symptoms worsen or if the condition fails to improve as anticipated.  I provided 13 minutes of non-face-to-face time during this encounter.   Baylee Mccorkel M Belina Mandile, PA-C

## 2020-10-24 NOTE — Patient Instructions (Signed)

## 2020-11-07 DIAGNOSIS — H353231 Exudative age-related macular degeneration, bilateral, with active choroidal neovascularization: Secondary | ICD-10-CM | POA: Diagnosis not present

## 2020-11-07 DIAGNOSIS — H35723 Serous detachment of retinal pigment epithelium, bilateral: Secondary | ICD-10-CM | POA: Diagnosis not present

## 2020-11-07 DIAGNOSIS — Z961 Presence of intraocular lens: Secondary | ICD-10-CM | POA: Diagnosis not present

## 2020-12-16 ENCOUNTER — Ambulatory Visit: Payer: Medicare PPO | Admitting: Family Medicine

## 2021-01-02 DIAGNOSIS — H353231 Exudative age-related macular degeneration, bilateral, with active choroidal neovascularization: Secondary | ICD-10-CM | POA: Diagnosis not present

## 2021-01-02 DIAGNOSIS — Z961 Presence of intraocular lens: Secondary | ICD-10-CM | POA: Diagnosis not present

## 2021-01-02 DIAGNOSIS — H35723 Serous detachment of retinal pigment epithelium, bilateral: Secondary | ICD-10-CM | POA: Diagnosis not present

## 2021-01-07 NOTE — Progress Notes (Signed)
HEMATOLOGY/ONCOLOGY CLINIC NOTE  Date of Service: 01/08/2021  Patient Care Team: Marin Olp, MD as PCP - General (Family Medicine) Brunetta Genera, MD as Consulting Physician (Hematology) Justice Britain, MD as Consulting Physician (Orthopedic Surgery) Armandina Gemma, MD as Consulting Physician (General Surgery) Zadie Rhine Clent Demark, MD as Consulting Physician (Ophthalmology) Suella Broad, MD as Consulting Physician (Physical Medicine and Rehabilitation) Zebedee Iba., MD as Referring Physician (Ophthalmology) Gaynelle Arabian, MD as Consulting Physician (Orthopedic Surgery)  CHIEF COMPLAINTS/PURPOSE OF CONSULTATION:  Diffuse Large B-Cell Lymphoma  HISTORY OF PRESENTING ILLNESS:   Jay Webster is a wonderful 87 y.o. male who has been referred to [place] by surgeon Dr. Armandina Gemma for evaluation and management of Diffuse Large B-Cell Lymphoma. The pt reports that he is doing well overall.   Of note prior to the patient's visit today, pt has had Perineum biopsy completed on 03/11/18 with results revealing Diffuse Large B-Cell Lymphoma. The pt had surgery to remove his irregularly shaped mass in the left anterior portion of the perineum with Dr. Armandina Gemma.  The pt reports first noticing the mass in his left buttock about 2 months ago, after a visit with Dr. Trena Platt. The pt decided to consult his surgeon Dr. Armandina Gemma whom he previously had a thyroidectomy with. The pt notes that the spot became as large as a golf ball and did not breach the skin. The pt notes that it wasn't terribly uncomfortable to sit on. He denies feeling any differently recently than in the past  6 months to a year.   The pt notes that he has been having some continued discharge and is using Depends. He is changing his gauze twice each day.   He denies any recent medical problems. He continues to function independently at home and lives alone, as his partner of 30+ years died in the past year. He notes  that he does not have any family members or friends who he would want to include in his care. He denies any difficulties functioning day to day and denies concerns for his memory.   He has been on thyroid replacement for the last 10 years following a thyroidectomy after thyroid cancer, and was treated with one dose of radioactive iodine.   The pt also notes a uric acid kidney stone history which hasn't been a problem in many years.    Most recent lab results (02/28/18) of CBC  is as follows: all values are WNL except for HCT at 38.3.  On review of systems, pt reports left buttock surgical wound, ganglion cyst on right wrist, and denies fevers, chills, night sweats, unexpected weight loss, new fatigue, pain along the spine, leg swelling, noticing any new lumps or bumps, and any other symptoms.   On PMHx the pt denies heart problems, strokes, abdominal problems, lung problems.  On Social Hx the pt reports that he quit smoking in 1990, and used to smoke 1-2 packs of cigarettes each day before this. He denies excess alcohol being a problem, and consumes one glass of wine rarely. 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  On Family Hx the pt reports brother died of sarcoma, different brother with Type I DM, and denies other cancer or blood disorders.  INTERVAL HISTORY:    Jay Webster returns today for management and evaluation of his Diffuse Large B-Cell Lymphoma. The patient's last visit with [place] was on 07/10/2020. The pt reports that  he is doing well overall.  The pt reports that he has been very fatigued. The pt notes that he desires to pass away but does not desire to be miserable. The pt notes that he will be speaking with his PCP regarding this and his therapist. The pt notes that he desires death due to everything he once loved being gone already. The pt notes no new physical symptoms or concerns. The pt notes that his memory has been worsening lately  and he took an hour to make it to the clinic today.  Lab results today 01/08/2021 of CBC w/diff and CMP is as follows: all values are WNL except for RBC of 4.13, Hgb of 12.5, HCT of 36.5, Glucose of 101. 01/08/2021 LDH of 213.  On review of systems, pt reports fatigue, death ideations, depression, worsening memory and denies suicidal ideations, new lumps/bumps, fevers, chills, night sweats, decreased appetite, sudden weight loss, abdominal pain, leg swelling, back pain, and any other symptoms.  MEDICAL HISTORY:   Past Medical History:  Diagnosis Date  . Arthritis    OA AND PAIN LEFT HIP AND OTHER JOINT PAINS  . Cancer (HCC)    THYROID CANCER - HAD THYROID REMOVED  . History of kidney stones   . History of shingles    RESOLVED  . Hypertension   . Hypothyroidism   . Low back pain   . Macular degeneration of both eyes   . RBBB (right bundle branch block with left anterior fascicular block)   . Seasonal allergies   . Thyroid disease   . Uric acid renal calculus 06/20/2007   Qualifier: Diagnosis of  By: Rogue Bussing CMA, Maryann Alar      SURGICAL HISTORY: Past Surgical History:  Procedure Laterality Date  . APPENDECTOMY  1948  . CATARACT EXTRACTION Right 08/27/2019  . COLONOSCOPY    . Elbow Radical Reduction  1973  . IR IMAGING GUIDED PORT INSERTION  05/05/2018  . IR REMOVAL TUN ACCESS W/ PORT W/O FL MOD SED  10/18/2018  . [place]  . MASS EXCISION N/A 03/11/2018   Procedure: EXCISION MASS OF PERINEUM;  Surgeon: Armandina Gemma, MD;  Location: [facility];  Service: General;  Laterality: N/A;  . REMOVAL OF FIRST RIB   NOV 1994   FOR COMPRESSED VEIN BETWEEN 1 ST RIB AND COLLARBONE  . Rib removed, 1st  1994  . ROTATOR CUFF REPAIR  2009  . THYROIDECTOMY  2010  . TOTAL HIP ARTHROPLASTY Left 05/31/2013   Procedure: LEFT TOTAL HIP ARTHROPLASTY ANTERIOR APPROACH;  Surgeon: Gearlean Alf, MD;  Location: [facility];  Service: Orthopedics;  Laterality: Left;    SOCIAL  HISTORY: Social History   Socioeconomic History  . Marital status: Widowed    Spouse name: Not on file  . Number of children: Not on file  . Years of education: Not on file  . Highest education level: Not on file  Occupational History  . Occupation: retired  Tobacco Use  . Smoking status: Former Smoker    Types: Cigarettes    Quit date: 02/22/1979    Years since quitting: 41.9  . Smokeless tobacco: Never Used  Vaping Use  . Vaping Use: Never used  Substance and Sexual Activity  . Alcohol use: Yes    Alcohol/week: 0.0 - 1.0 standard drinks  . Drug use: No  . Sexual activity: Not Currently  Other Topics Concern  . Not on file  Social History Narrative   Lives alone and 1  cat. Divorced from mongomous long term partner and eventual husband. No children from prior relationships.       Retired Geologist, engineering: socializing, used to play tennis   Social Determinants of Radio broadcast assistant Strain: DeBary   . Difficulty of Paying Living Expenses: Not hard at all  Food Insecurity: No Food Insecurity  . Worried About Charity fundraiser in the Last Year: Never true  . Ran Out of Food in the Last Year: Never true  Transportation Needs: No Transportation Needs  . Lack of Transportation (Medical): No  . Lack of Transportation (Non-Medical): No  Physical Activity: Inactive  . Days of Exercise per Week: 0 days  . Minutes of Exercise per Session: 0 min  Stress: No Stress Concern Present  . Feeling of Stress : Only a little  Social Connections: Socially Isolated  . Frequency of Communication with Friends and Family: Once a week  . Frequency of Social Gatherings with Friends and Family: Twice a week  . Attends Religious Services: Never  . Active Member of Clubs or Organizations: No  . Attends Archivist Meetings: Never  . Marital Status: Widowed  Intimate Partner Violence: Not At Risk  . Fear of Current or Ex-Partner: No  . Emotionally Abused:  No  . Physically Abused: No  . Sexually Abused: No    FAMILY HISTORY: Family History  Problem Relation Age of Onset  . Heart disease Mother        passed in 71s  . Diabetes Mother   . Alcohol abuse Father        passed from this  . Parkinson's disease Sister   . Diabetes Brother   . Dementia Sister   . Cancer Brother        sounds like bone cancer- non healing knee injury    ALLERGIES:  is allergic to penicillins, shingrix [zoster vac recomb adjuvanted], and zoloft [sertraline hcl].  MEDICATIONS:  Current Outpatient Medications  Medication Sig Dispense Refill  . acetaminophen (TYLENOL) 325 MG tablet Take 325-650 mg by mouth every 6 (six) hours as needed for headache.     . Aflibercept 2 MG/0.05ML SOLN Inject 1 Dose into the eye every 8 (eight) weeks.     Marland Kitchen allopurinol (ZYLOPRIM) 300 MG tablet TAKE 1 TABLET EVERY DAY 90 tablet 3  . azelastine (ASTELIN) 0.1 % nasal spray Place 2 sprays into both nostrils 2 (two) times daily. Use in each nostril as directed 18 mL 0  . benazepril (LOTENSIN) 20 MG tablet TAKE 1 TABLET EVERY DAY 90 tablet 3  . cetirizine (ZYRTEC) 10 MG tablet Take 10 mg by mouth daily as needed for allergies.    Marland Kitchen Fexofenadine HCl (ALLEGRA PO) Take by mouth 2 (two) times daily.    . Glucosamine-Chondroitin (OSTEO BI-FLEX REGULAR STRENGTH PO) Take 1 tablet by mouth 2 (two) times daily.    Marland Kitchen levothyroxine (SYNTHROID) 125 MCG tablet Take 1 tablet (125 mcg total) by mouth daily. 90 tablet 3  . Multiple Vitamin (MULTIVITAMIN) tablet Take 1 tablet by mouth daily.    . Multiple Vitamins-Minerals (PRESERVISION AREDS 2 PO) Take 1 tablet by mouth 2 (two) times daily.    Vladimir Faster Glycol-Propyl Glycol (SYSTANE OP) Place 1 drop into both eyes daily as needed (irritation).    . sodium chloride (OCEAN) 0.65 % SOLN nasal spray Place 1 spray into both nostrils as needed for congestion.     No current facility-administered  medications for this visit.    REVIEW OF SYSTEMS:   10  Point review of Systems was done is negative except as noted above.  PHYSICAL EXAMINATION: ECOG FS:2 - Symptomatic, <50% confined to bed  Vitals:   01/08/21 1103  BP: 125/63  Pulse: 64  Resp: 18  Temp: 97.6 F (36.4 C)  SpO2: 100%   Wt Readings from Last 3 Encounters:  01/08/21 181 lb 6.4 oz (82.3 kg)  08/20/20 178 lb (80.7 kg)  07/10/20 183 lb 4.8 oz (83.1 kg)   Body mass index is 31.14 kg/m.    GENERAL:alert, in no acute distress and comfortable SKIN: no acute rashes, no significant lesions EYES: conjunctiva are pink and non-injected, sclera anicteric OROPHARYNX: MMM, no exudates, no oropharyngeal erythema or ulceration NECK: supple, no JVD LYMPH:  no palpable lymphadenopathy in the cervical, axillary or inguinal regions LUNGS: clear to auscultation b/l with normal respiratory effort HEART: regular rate & rhythm ABDOMEN:  normoactive bowel sounds , non tender, not distended. No palpable hepatosplenomegaly.  Extremity: no pedal edema PSYCH: alert & oriented x 3 with fluent speech NEURO: no focal motor/sensory deficits  LABORATORY DATA:  I have reviewed the data as listed  . CBC Latest Ref Rng & Units 01/08/2021 07/10/2020 04/10/2020  WBC 4.0 - 10.5 K/uL 4.3 5.4 6.0  Hemoglobin 13.0 - 17.0 g/dL 12.5(L) 11.8(L) 12.2(L)  Hematocrit 39.0 - 52.0 % 36.5(L) 35.8(L) 35.3(L)  Platelets 150 - 400 K/uL 208 192 188   . CBC    Component Value Date/Time   WBC 4.3 01/08/2021 0855   RBC 4.13 (L) 01/08/2021 0855   HGB 12.5 (L) 01/08/2021 0855   HGB 10.4 (L) 08/02/2018 0828   HCT 36.5 (L) 01/08/2021 0855   PLT 208 01/08/2021 0855   PLT 260 08/02/2018 0828   MCV 88.4 01/08/2021 0855   MCH 30.3 01/08/2021 0855   MCHC 34.2 01/08/2021 0855   RDW 13.2 01/08/2021 0855   LYMPHSABS 1.2 01/08/2021 0855   MONOABS 0.4 01/08/2021 0855   EOSABS 0.1 01/08/2021 0855   BASOSABS 0.0 01/08/2021 0855    CMP Latest Ref Rng & Units 01/08/2021 07/10/2020 04/10/2020  Glucose 70 - 99 mg/dL 101(H) 95  108(H)  BUN 8 - 23 mg/dL 19 19 23   Creatinine 0.61 - 1.24 mg/dL 0.84 0.81 0.86  Sodium 135 - 145 mmol/L 141 141 142  Potassium 3.5 - 5.1 mmol/L 4.2 4.4 4.0  Chloride 98 - 111 mmol/L 107 107 110  CO2 22 - 32 mmol/L 23 29 24   Calcium 8.9 - 10.3 mg/dL 9.2 9.2 9.9  Total Protein 6.5 - 8.1 g/dL 7.2 6.9 6.8  Total Bilirubin 0.3 - 1.2 mg/dL 0.4 0.3 0.3  Alkaline Phos 38 - 126 U/L 104 99 93  AST 15 - 41 U/L 19 16 16   ALT 0 - 44 U/L 17 17 14    . Lab Results  Component Value Date   LDH 213 (H) 01/08/2021   Component     Latest Ref Rng & Units 03/24/2018  LDH     98 - 192 U/L 185  HCV Ab     0.0 - 0.9 s/co ratio 0.2  Hep B Core Ab, Tot     Negative Negative  Hepatitis B Surface Ag     Negative Negative  HIV Screen 4th Generation wRfx     Non Reactive Non Reactive    03/11/18 Perineum Bx:    RADIOGRAPHIC STUDIES: I have personally reviewed the radiological images as listed and  agreed with the findings in the report. No results found.  ASSESSMENT & PLAN:   87 y.o. male with  1. Diffuse Large B-Cell Lymphoma -newly diagnosed presenting as a mass in the left medial gluteal region/perineum s/p resection. PLAN -03/11/18 Surgical bx which revealed Diffuse Large B-Cell Non-Hodgkin's Lymphoma  -04/13/18 ECHO revealed LV EF of 60-65%  -No Hep B, Hep C or HIV from 03/24/18 labs -04/18/18 PET/CT which revealed Mildly hypermetabolic small to borderline enlarged abdominal and pelvic retroperitoneal lymph nodes. Probable mild hypermetabolism within subcentimeter axillary lymph nodes, with misregistration on fused images. 2. Scattered lucent lesions in the iliac wings and right fifth rib. Continued attention on follow-up exams is warranted. 3. Scattered small pulmonary nodules are nonspecific and too small for PET resolution. Continued attention on follow-up exams is warranted. 4. Intermediate density lesion deep to the left gluteal fold, with associated hypermetabolism, which may represent a complex  cyst or abscess. 5. Aortic atherosclerosis. Coronary artery calcification. 6. Left renal stone. -Treatment plan include R-CHOP for 6 cycles starting 05/10/18   07/11/18 PET/CT revealed No evidence of recurrent lymphoma. 2. Left renal stone. 3. Aortic atherosclerosis. Coronary artery calcification.   S/p 6 cycles of R-CHOP completed on 08/24/18  09/22/18 PET/CT revealed No evidence of hypermetabolic active lymphoma. 2. Aortic atherosclerosis, coronary artery atherosclerosis and emphysema. 3. Left nephrolithiasis. 4. Sinus disease.  2. Memory issues ? MCI vs dementia    PLAN:  -Discussed pt labwork today, 01/08/2021; blood counts and chemistries stable. LDH stable. -Discussed pt's goal of care and current life desires at this time at length. -Recommended pt connect with therapist. -No lab or clinical evidence of DLBCL recurrence at this time. Will continue watchful observation. -Will see back in 6 months with labs.  2.  Patient Active Problem List   Diagnosis Date Noted  . Pulmonary emphysema, unspecified emphysema type ([place]) 02/19/2020  . Atherosclerosis of aorta ([place]) 02/19/2020  . Memory loss 02/19/2020  . Former smoker 08/16/2019  . Depression, major, single episode, mild ([place]) 08/16/2019  . Diffuse large B-cell lymphoma ([place]) 03/30/2018  . Degeneration of lumbar intervertebral disc 10/05/2017  . History of total hip arthroplasty 10/05/2017  . OA (osteoarthritis) of hip 05/31/2013  . History of thyroid cancer 04/21/2011  . Macular degeneration 01/05/2011  . HYPOTHYROIDISM, POSTSURGICAL 11/20/2008  . Osteoarthritis 07/01/2007  . Uric acid renal calculus 06/20/2007  . Essential hypertension 06/20/2007  -continue f/u with PCP for mx of other chronic medical issues.   FOLLOW UP: RTC with Dr Irene Limbo with labs in 6 months    The total time spent in the appt was 20 minutes and more than 50% was on counseling and direct patient cares.  All of the patient's questions were answered  with apparent satisfaction. The patient knows to call the clinic with any problems, questions or concerns.   Sullivan Lone MD [place] AAHIVMS [facility] Hematology/Oncology Physician [facility]  (Office):       [phone] (Work cell):  [phone] (Fax):           [phone]  01/08/2021 11:25 AM   I, Reinaldo Raddle, am acting as scribe for Dr. Sullivan Lone, MD.     .I have reviewed the above documentation for accuracy and completeness, and I agree with the above. Brunetta Genera MD

## 2021-01-08 ENCOUNTER — Telehealth: Payer: Self-pay | Admitting: Hematology

## 2021-01-08 ENCOUNTER — Other Ambulatory Visit: Payer: Self-pay

## 2021-01-08 ENCOUNTER — Inpatient Hospital Stay: Payer: Medicare PPO | Admitting: Hematology

## 2021-01-08 ENCOUNTER — Inpatient Hospital Stay: Payer: Medicare PPO | Attending: Hematology

## 2021-01-08 VITALS — BP 125/63 | HR 64 | Temp 97.6°F | Resp 18 | Ht 64.0 in | Wt 181.4 lb

## 2021-01-08 DIAGNOSIS — Z87891 Personal history of nicotine dependence: Secondary | ICD-10-CM | POA: Insufficient documentation

## 2021-01-08 DIAGNOSIS — M1990 Unspecified osteoarthritis, unspecified site: Secondary | ICD-10-CM | POA: Diagnosis not present

## 2021-01-08 DIAGNOSIS — C8330 Diffuse large B-cell lymphoma, unspecified site: Secondary | ICD-10-CM | POA: Diagnosis not present

## 2021-01-08 DIAGNOSIS — E039 Hypothyroidism, unspecified: Secondary | ICD-10-CM | POA: Diagnosis not present

## 2021-01-08 DIAGNOSIS — R5383 Other fatigue: Secondary | ICD-10-CM | POA: Diagnosis not present

## 2021-01-08 DIAGNOSIS — I10 Essential (primary) hypertension: Secondary | ICD-10-CM | POA: Insufficient documentation

## 2021-01-08 DIAGNOSIS — Z79899 Other long term (current) drug therapy: Secondary | ICD-10-CM | POA: Diagnosis not present

## 2021-01-08 LAB — CBC WITH DIFFERENTIAL/PLATELET
Abs Immature Granulocytes: 0.02 10*3/uL (ref 0.00–0.07)
Basophils Absolute: 0 10*3/uL (ref 0.0–0.1)
Basophils Relative: 1 %
Eosinophils Absolute: 0.1 10*3/uL (ref 0.0–0.5)
Eosinophils Relative: 3 %
HCT: 36.5 % — ABNORMAL LOW (ref 39.0–52.0)
Hemoglobin: 12.5 g/dL — ABNORMAL LOW (ref 13.0–17.0)
Immature Granulocytes: 1 %
Lymphocytes Relative: 29 %
Lymphs Abs: 1.2 10*3/uL (ref 0.7–4.0)
MCH: 30.3 pg (ref 26.0–34.0)
MCHC: 34.2 g/dL (ref 30.0–36.0)
MCV: 88.4 fL (ref 80.0–100.0)
Monocytes Absolute: 0.4 10*3/uL (ref 0.1–1.0)
Monocytes Relative: 9 %
Neutro Abs: 2.5 10*3/uL (ref 1.7–7.7)
Neutrophils Relative %: 57 %
Platelets: 208 10*3/uL (ref 150–400)
RBC: 4.13 MIL/uL — ABNORMAL LOW (ref 4.22–5.81)
RDW: 13.2 % (ref 11.5–15.5)
WBC: 4.3 10*3/uL (ref 4.0–10.5)
nRBC: 0 % (ref 0.0–0.2)

## 2021-01-08 LAB — CMP (CANCER CENTER ONLY)
ALT: 17 U/L (ref 0–44)
AST: 19 U/L (ref 15–41)
Albumin: 3.7 g/dL (ref 3.5–5.0)
Alkaline Phosphatase: 104 U/L (ref 38–126)
Anion gap: 11 (ref 5–15)
BUN: 19 mg/dL (ref 8–23)
CO2: 23 mmol/L (ref 22–32)
Calcium: 9.2 mg/dL (ref 8.9–10.3)
Chloride: 107 mmol/L (ref 98–111)
Creatinine: 0.84 mg/dL (ref 0.61–1.24)
GFR, Estimated: >60 mL/min (ref >60)
Glucose, Bld: 101 mg/dL — ABNORMAL HIGH (ref 70–99)
Potassium: 4.2 mmol/L (ref 3.5–5.1)
Sodium: 141 mmol/L (ref 135–145)
Total Bilirubin: 0.4 mg/dL (ref 0.3–1.2)
Total Protein: 7.2 g/dL (ref 6.5–8.1)

## 2021-01-08 LAB — LACTATE DEHYDROGENASE: LDH: 213 U/L — ABNORMAL HIGH (ref 98–192)

## 2021-01-08 NOTE — Telephone Encounter (Signed)
Scheduled follow-up appointment per 5/4 los. Patient is aware. ?

## 2021-01-20 NOTE — Patient Instructions (Addendum)
[email or web address] Nathaneil Canary mobile: [phone]  Another option- call  [phone] to schedule a visit with [place] [facility]  You wanted to hold off on medicine for now  If you have any thoughts of self harm contact us or 911 immediately

## 2021-01-20 NOTE — Progress Notes (Signed)
Phone [phone] In person visit   Subjective:   Jay Webster is a 87 y.o. year old very pleasant male patient who presents for/with See problem oriented charting Chief Complaint  Patient presents with  . Depression   This visit occurred during the SARS-CoV-2 public health emergency.  Safety protocols were in place, including screening questions prior to the visit, additional usage of staff PPE, and extensive cleaning of exam room while observing appropriate contact time as indicated for disinfecting solutions.   Past Medical History-  Patient Active Problem List   Diagnosis Date Noted  . Memory loss 02/19/2020    Priority: High  . Diffuse large B-cell lymphoma ([place]) 03/30/2018    Priority: High  . Pulmonary emphysema, unspecified emphysema type ([place]) 02/19/2020    Priority: Medium  . Atherosclerosis of aorta ([place]) 02/19/2020    Priority: Medium  . Depression, major, single episode, mild ([place]) 08/16/2019    Priority: Medium  . History of total hip arthroplasty 10/05/2017    Priority: Medium  . History of thyroid cancer 04/21/2011    Priority: Medium  . Macular degeneration 01/05/2011    Priority: Medium  . HYPOTHYROIDISM, POSTSURGICAL 11/20/2008    Priority: Medium  . Osteoarthritis 07/01/2007    Priority: Medium  . Uric acid renal calculus 06/20/2007    Priority: Medium  . Essential hypertension 06/20/2007    Priority: Medium  . Former smoker 08/16/2019    Priority: Low  . Degeneration of lumbar intervertebral disc 10/05/2017    Priority: Low  . OA (osteoarthritis) of hip 05/31/2013    Priority: Low    Medications- reviewed and updated Current Outpatient Medications  Medication Sig Dispense Refill  . acetaminophen (TYLENOL) 325 MG tablet Take 325-650 mg by mouth every 6 (six) hours as needed for headache.     . Aflibercept 2 MG/0.05ML SOLN Inject 1 Dose into the eye every 8 (eight) weeks.     Marland Kitchen allopurinol (ZYLOPRIM) 300 MG tablet TAKE 1 TABLET EVERY DAY  90 tablet 3  . benazepril (LOTENSIN) 20 MG tablet TAKE 1 TABLET EVERY DAY 90 tablet 3  . Fexofenadine HCl (ALLEGRA PO) Take by mouth 2 (two) times daily.    . Glucosamine-Chondroitin (OSTEO BI-FLEX REGULAR STRENGTH PO) Take 1 tablet by mouth 2 (two) times daily.    Marland Kitchen levothyroxine (SYNTHROID) 125 MCG tablet Take 1 tablet (125 mcg total) by mouth daily. 90 tablet 3  . Multiple Vitamin (MULTIVITAMIN) tablet Take 1 tablet by mouth daily.    . Multiple Vitamins-Minerals (PRESERVISION AREDS 2 PO) Take 1 tablet by mouth 2 (two) times daily.    Vladimir Faster Glycol-Propyl Glycol (SYSTANE OP) Place 1 drop into both eyes daily as needed (irritation).    . sodium chloride (OCEAN) 0.65 % SOLN nasal spray Place 1 spray into both nostrils as needed for congestion.    Marland Kitchen azelastine (ASTELIN) 0.1 % nasal spray Place 2 sprays into both nostrils 2 (two) times daily. Use in each nostril as directed 18 mL 0  . cetirizine (ZYRTEC) 10 MG tablet Take 10 mg by mouth daily as needed for allergies. (Patient not taking: Reported on 01/21/2021)     No current facility-administered medications for this visit.     Objective:  BP 120/68   Pulse 74   Temp 98 F (36.7 C) (Temporal)   Wt 181 lb (82.1 kg)   SpO2 97%   BMI 31.07 kg/m  Gen: NAD, resting comfortably CV: RRR no murmurs rubs or gallops Lungs: CTAB  no crackles, wheeze, rhonchi    Assessment and Plan    # Depression S: Patient has had ongoing issues with loneliness after her divorce from his husband.  We had considered therapy/counseling in the past but patient Was concerned about the pandemic.I gave him information for [place] [facility] last visit  Patient declines senior center activities.  Patient has few relationships-does have a friendship with a next-door neighbor but they had a child recently and have not been able to spend as much time together  He does not want to wake up every morning- does not feel like he has much to live for-  but has  no thoughts of self harm. He has even thought he would not mind if cancer returned (it has not). Has lost a lot of friends and even was part of process for withdrawing life support which was hard on him. Feels like all friends have passed or moved out of the country  Medication: None and declines Depression screen [facility] 2/9 01/21/2021 08/20/2020 05/27/2020  Decreased Interest 3 0 0  Down, Depressed, Hopeless 3 1 1   PHQ - 2 Score 6 1 1   Altered sleeping 0 0 -  Tired, decreased energy 1 0 -  Change in appetite 0 0 -  Feeling bad or failure about yourself  3 0 -  Trouble concentrating 2 0 -  Moving slowly or fidgety/restless 1 0 -  Suicidal thoughts 0 1 -  PHQ-9 Score 13 2 -  Difficult doing work/chores Somewhat difficult Not difficult at all -   A/P: Patient with moderate depression based on elevated PHQ-9.  No thoughts of self-harm.  Does have thoughts of not wanting to wake up the next day but would not harm himself.  He is not interested in medications.  He does seem more open to therapy than he was previously-I gave him the name to a local EMDR provider as well as [facility]-strongly encouraged him to follow through with this and recommended 1 month follow-up -Thyroid well controlled at last visit-I do not think this is the cause for symptoms  Recommended follow up: Return in about 1 month (around 02/21/2021) for follow up- or sooner if needed. Future Appointments  Date Time Provider [place]  02/27/2021  9:20 AM Marin Olp, MD [facility]  06/02/2021  2:30 PM [facility] HEALTH COACH [facility]  07/11/2021 10:00 AM [facility] None  07/11/2021 10:40 AM Kale, Cloria [place], MD [facility] None    Lab/Order associations:   ICD-10-CM   1. Depression, major, single episode, moderate (HCC)  F32.1    Time Spent: 30 minutes of total time (1:53 PM- 2:23 PM) was spent on the date of the encounter performing the following actions: chart review prior  to seeing the patient, obtaining history, performing a medically necessary exam, counseling on the treatment plan, placing orders, and documenting in our EHR.   Return precautions advised.  Garret Reddish, MD

## 2021-01-21 ENCOUNTER — Other Ambulatory Visit: Payer: Self-pay

## 2021-01-21 ENCOUNTER — Encounter: Payer: Self-pay | Admitting: Family Medicine

## 2021-01-21 ENCOUNTER — Ambulatory Visit: Payer: Medicare PPO | Admitting: Family Medicine

## 2021-01-21 VITALS — BP 120/68 | HR 74 | Temp 98.0°F | Wt 181.0 lb

## 2021-01-21 DIAGNOSIS — F321 Major depressive disorder, single episode, moderate: Secondary | ICD-10-CM | POA: Diagnosis not present

## 2021-02-27 ENCOUNTER — Encounter: Payer: Medicare PPO | Admitting: Family Medicine

## 2021-02-27 ENCOUNTER — Telehealth: Payer: Self-pay

## 2021-02-27 NOTE — Telephone Encounter (Signed)
error 

## 2021-03-06 DIAGNOSIS — Z961 Presence of intraocular lens: Secondary | ICD-10-CM | POA: Diagnosis not present

## 2021-03-06 DIAGNOSIS — H35723 Serous detachment of retinal pigment epithelium, bilateral: Secondary | ICD-10-CM | POA: Diagnosis not present

## 2021-03-06 DIAGNOSIS — H353231 Exudative age-related macular degeneration, bilateral, with active choroidal neovascularization: Secondary | ICD-10-CM | POA: Diagnosis not present

## 2021-03-26 ENCOUNTER — Ambulatory Visit (HOSPITAL_BASED_OUTPATIENT_CLINIC_OR_DEPARTMENT_OTHER): Payer: Medicare PPO | Admitting: Family Medicine

## 2021-03-26 ENCOUNTER — Other Ambulatory Visit: Payer: Self-pay

## 2021-03-26 ENCOUNTER — Encounter (HOSPITAL_BASED_OUTPATIENT_CLINIC_OR_DEPARTMENT_OTHER): Payer: Self-pay | Admitting: Family Medicine

## 2021-03-26 VITALS — BP 104/58 | HR 79 | Ht 64.0 in | Wt 175.0 lb

## 2021-03-26 DIAGNOSIS — H9193 Unspecified hearing loss, bilateral: Secondary | ICD-10-CM | POA: Diagnosis not present

## 2021-03-26 DIAGNOSIS — I10 Essential (primary) hypertension: Secondary | ICD-10-CM

## 2021-03-26 DIAGNOSIS — H547 Unspecified visual loss: Secondary | ICD-10-CM | POA: Diagnosis not present

## 2021-03-26 DIAGNOSIS — F321 Major depressive disorder, single episode, moderate: Secondary | ICD-10-CM | POA: Insufficient documentation

## 2021-03-26 NOTE — Assessment & Plan Note (Signed)
Will refer for audiometric evaluation to arrange for patient to obtain new hearing aids

## 2021-03-26 NOTE — Assessment & Plan Note (Signed)
Has noticed decrease in his visual acuity, will refer to optometry for evaluation and assessment

## 2021-03-26 NOTE — Patient Instructions (Signed)
  Medication Instructions:  Your physician recommends that you continue on your current medications as directed. Please refer to the Current Medication list given to you today. --If you need a refill on any your medications before your next appointment, please call your pharmacy first. If no refills are authorized on file call the office.-- Referrals/Procedures/Imaging: A referral has been placed for you to Audiology for evaluation and treatment. Someone from the scheduling department will be in contact with you in regards to coordinating your consultation. If you do not hear from any of the schedulers within 7-10 business days please give our office a call.  A referral has been placed for you to Optometry for evaluation and treatment. Someone from the scheduling department will be in contact with you in regards to coordinating your consultation. If you do not hear from any of the schedulers within 7-10 business days please give our office a call.  A referral has been placed for you to Dr. Michail Sermon with [facility]. Dr. Michail Sermon is a psychologist who specializes in cognitive and behavioral therapy, he does not prescribe medications. Someone from the scheduling department will be in contact with you in regards to coordinating your consultation. If you do not hear from any of the schedulers within 7-10 business days please give our office a call  Follow-Up: Your next appointment:   Your physician recommends that you schedule a follow-up appointment in: 6 WEEKS with Dr. de [place]  Thanks for letting us be apart of your health journey!!  Primary Care and Sports Medicine   Dr. Arlina Robes [place]   We encourage you to activate your patient portal called "MyChart".  Sign up information is provided on this After Visit Summary.  MyChart is used to connect with patients for Virtual Visits (Telemedicine).  Patients are able to view lab/test results, encounter notes, upcoming appointments, etc.   Non-urgent messages can be sent to your provider as well. To learn more about what you can do with MyChart, please visit --  [email or web address].

## 2021-03-26 NOTE — Assessment & Plan Note (Signed)
Blood pressure somewhat on the lower side here in the office today as well as with reported home reading Not having any hypotensive symptoms Can continue with benazepril for now, may need to consider decreasing dose and monitoring blood pressure to avoid development of symptomatic hypotension and thus increased risk of fall

## 2021-03-26 NOTE — Assessment & Plan Note (Signed)
Has been ongoing issue with patient.  Has discussed with prior PCP with recommendation of treatment, specifically with counseling Discussed this further in the office today, he is interested in counseling, referral placed Denies any present or past suicidal ideation, precautions discussed regarding if there is any change in status and that he should contact the clinic or present to local emergency department Continue with close follow-up to monitor chronic medical conditions as well as mood status

## 2021-03-26 NOTE — Progress Notes (Signed)
New Patient Office Visit  Subjective:  Patient ID: Jay Webster, male    DOB: [birth year]  Age: 87 y.o. MRN: [id number]  CC:  Chief Complaint  Patient presents with   Establish Care    Former PCP  Dr. Sheppard Penton   Hearing Loss    Patient has diminished capacity to hear and has historically had hearing aids but states they have "died" so he needs a referral to have another pair ordered   Joint Pain    Patient complaining of joint pain and deformations in his hands and wrist.    HPI [place] Jay Webster is an 87 year old male presenting to establish in clinic.  He has current concerns today related to difficulty with hearing with recently broken hearing aid.  Patient with past medical history of hypothyroidism, hypertension, macular degeneration, uric acid kidney stones.  Hearing difficulty: Reports having a hearing aid previously but that this is not broken.  He is unsure exactly how this broke.  Is requesting referral to be able to obtain a new hearing aid.  Hypothyroidism: Had underlying thyroid cancer which led to removal.  Patient currently managed on levothyroxine 125 mcg daily.  Most recent TSH was within normal limits.  Presently without any signs or symptoms of hypo or hyperthyroidism.  Hypertension: Has been managed on benazepril.  Reports that he has been taking this medication without issue.  Denies any issues with chest pain, shortness of breath, lightheadedness.  Does check blood pressure intermittently at home recent reading was about 100/60.  Denies any symptoms suggesting hypotension.  Uric acid kidney stones: Currently takes allopurinol.  Denies any issues with this medication in the past.  Most recent uric acid in December was 3.3.  Depressed mood: Per chart review this has been an ongoing issue for patient.  Symptoms seem to have become more prominent following divorce from his husband.  Patient in the past and currently indicates that he would be okay with dying.   He denies ever experiencing any suicidal ideation and states that "he would never be able to hurt himself."  Patient references his religious upbringing as to the primary reason he would not be able to hurt himself.  Patient describes self as an introvert and that he does not have any close friends or relationships at present.  Prior PCP has discussed depressed mood with patient in the past.  There has been recommendations regarding treatment, particularly with counseling as well as suggesting community resources for engagement in social interaction.  Patient has declined and not proceeded with these in the past.  Patient used to work in Office manager.  Past Medical History:  Diagnosis Date   Arthritis    OA AND PAIN LEFT HIP AND OTHER JOINT PAINS   Cancer ([place])    THYROID CANCER - HAD THYROID REMOVED   History of kidney stones    History of shingles    RESOLVED   Hypertension    Hypothyroidism    Low back pain    Macular degeneration of both eyes    RBBB (right bundle branch block with left anterior fascicular block)    Seasonal allergies    Thyroid disease    Uric acid renal calculus 06/20/2007   Qualifier: Diagnosis of  By: Rogue Bussing CMA, Maryann Alar      Past Surgical History:  Procedure Laterality Date   APPENDECTOMY  1948   CATARACT EXTRACTION Right 08/27/2019   COLONOSCOPY     Elbow Radical Reduction  1973  IR IMAGING GUIDED PORT INSERTION  05/05/2018   IR REMOVAL TUN ACCESS W/ PORT W/O FL MOD SED  10/18/2018   KIDNEY STONE SURGERY  1971, 1976   MASS EXCISION N/A 03/11/2018   Procedure: EXCISION MASS OF PERINEUM;  Surgeon: Armandina Gemma, MD;  Location: [facility];  Service: General;  Laterality: N/A;   REMOVAL OF FIRST RIB   [place] BETWEEN 1 ST RIB AND COLLARBONE   Rib removed, [place]  2009   THYROIDECTOMY  2010   TOTAL HIP ARTHROPLASTY Left 05/31/2013   Procedure: LEFT TOTAL HIP ARTHROPLASTY ANTERIOR APPROACH;  Surgeon: Gearlean Alf, MD;  Location: [facility];  Service: Orthopedics;  Laterality: Left;    Family History  Problem Relation Age of Onset   Heart disease Mother        passed in 45s   Diabetes Mother    Alcohol abuse Father        passed from this   Parkinson's disease Sister    Diabetes Brother    Dementia Sister    Cancer Brother        sounds like bone cancer- non healing knee injury    Social History   Socioeconomic History   Marital status: Widowed    Spouse name: Not on file   Number of children: Not on file   Years of education: Not on file   Highest education level: Not on file  Occupational History   Occupation: retired  Tobacco Use   Smoking status: Former    Types: Cigarettes    Quit date: 02/22/1979    Years since quitting: 42.1   Smokeless tobacco: Never  Vaping Use   Vaping Use: Never used  Substance and Sexual Activity   Alcohol use: Yes    Alcohol/week: 0.0 - 1.0 standard drinks   Drug use: No   Sexual activity: Not Currently  Other Topics Concern   Not on file  Social History Narrative   Lives alone and 1 cat. Divorced from mongomous long term partner and eventual husband. No children from prior relationships.       Retired Geologist, engineering: socializing, used to play tennis   Social Determinants of Radio broadcast assistant Strain: Low Risk    Difficulty of Paying Living Expenses: Not hard at all  Food Insecurity: No Food Insecurity   Worried About Charity fundraiser in the Last Year: Never true   Arboriculturist in the Last Year: Never true  Transportation Needs: No Transportation Needs   Lack of Transportation (Medical): No   Lack of Transportation (Non-Medical): No  Physical Activity: Inactive   Days of Exercise per Week: 0 days   Minutes of Exercise per Session: 0 min  Stress: No Stress Concern Present   Feeling of Stress : Only a little  Social Connections: Socially Isolated   Frequency of Communication with Friends and  Family: Once a week   Frequency of Social Gatherings with Friends and Family: Twice a week   Attends Religious Services: Never   Marine scientist or Organizations: No   Attends Archivist Meetings: Never   Marital Status: Widowed  Human resources officer Violence: Not At Risk   Fear of Current or Ex-Partner: No   Emotionally Abused: No   Physically Abused: No   Sexually Abused: No    Objective:   Today's Vitals: BP Marland Kitchen)  104/58   Pulse 79   Ht 5\' 4"  (1.626 m)   Wt 175 lb (79.4 kg)   SpO2 98%   BMI 30.04 kg/m   Physical Exam  87 year old male in no acute distress Cardiovascular exam with regular rate and rhythm, no murmurs appreciated Lungs clear to auscultation bilaterally Patient is somewhat sad and tearful when discussing his current situation and depressed mood.  Assessment & Plan:   Problem List Items Addressed This Visit       Cardiovascular and Mediastinum   Essential hypertension    Blood pressure somewhat on the lower side here in the office today as well as with reported home reading Not having any hypotensive symptoms Can continue with benazepril for now, may need to consider decreasing dose and monitoring blood pressure to avoid development of symptomatic hypotension and thus increased risk of fall         Other   Depression, major, single episode, moderate ([place]) - Primary    Has been ongoing issue with patient.  Has discussed with prior PCP with recommendation of treatment, specifically with counseling Discussed this further in the office today, he is interested in counseling, referral placed Denies any present or past suicidal ideation, precautions discussed regarding if there is any change in status and that he should contact the clinic or present to local emergency department Continue with close follow-up to monitor chronic medical conditions as well as mood status       Relevant Orders   Ambulatory referral to Psychology   Decreased  hearing of both ears    Will refer for audiometric evaluation to arrange for patient to obtain new hearing aids       Relevant Orders   Ambulatory referral to Audiology   Decreased visual acuity    Has noticed decrease in his visual acuity, will refer to optometry for evaluation and assessment       Relevant Orders   Ambulatory referral to Optometry    Outpatient Encounter Medications as of 03/26/2021  Medication Sig   acetaminophen (TYLENOL) 325 MG tablet Take 325-650 mg by mouth every 6 (six) hours as needed for headache.    Aflibercept 2 MG/0.05ML SOLN Inject 1 Dose into the eye every 8 (eight) weeks.    allopurinol (ZYLOPRIM) 300 MG tablet TAKE 1 TABLET EVERY DAY   benazepril (LOTENSIN) 20 MG tablet TAKE 1 TABLET EVERY DAY   cetirizine (ZYRTEC) 10 MG tablet Take 10 mg by mouth daily as needed for allergies.   Fexofenadine HCl (ALLEGRA PO) Take by mouth 2 (two) times daily.   Glucosamine-Chondroitin (OSTEO BI-FLEX REGULAR STRENGTH PO) Take 1 tablet by mouth 2 (two) times daily.   levothyroxine (SYNTHROID) 125 MCG tablet Take 1 tablet (125 mcg total) by mouth daily.   Multiple Vitamin (MULTIVITAMIN) tablet Take 1 tablet by mouth daily.   Multiple Vitamins-Minerals (PRESERVISION AREDS 2 PO) Take 1 tablet by mouth 2 (two) times daily.   Polyethyl Glycol-Propyl Glycol (SYSTANE OP) Place 1 drop into both eyes daily as needed (irritation).   sodium chloride (OCEAN) 0.65 % SOLN nasal spray Place 1 spray into both nostrils as needed for congestion.   [DISCONTINUED] azelastine (ASTELIN) 0.1 % nasal spray Place 2 sprays into both nostrils 2 (two) times daily. Use in each nostril as directed   No facility-administered encounter medications on file as of 03/26/2021.   Spent 50 minutes on this patient encounter, including preparation, chart review, face-to-face counseling with patient and coordination of care, and documentation of  encounter  Follow-up: No follow-ups on file.  Plan for  follow-up in about 6 to 8 weeks or sooner as needed  Rayan Dyal J De [place], MD

## 2021-05-07 ENCOUNTER — Other Ambulatory Visit: Payer: Self-pay

## 2021-05-07 ENCOUNTER — Ambulatory Visit (HOSPITAL_BASED_OUTPATIENT_CLINIC_OR_DEPARTMENT_OTHER): Payer: Medicare PPO | Admitting: Family Medicine

## 2021-05-07 VITALS — BP 144/68 | HR 74 | Ht 64.0 in | Wt 173.2 lb

## 2021-05-07 DIAGNOSIS — E890 Postprocedural hypothyroidism: Secondary | ICD-10-CM

## 2021-05-07 DIAGNOSIS — H9193 Unspecified hearing loss, bilateral: Secondary | ICD-10-CM

## 2021-05-07 DIAGNOSIS — F321 Major depressive disorder, single episode, moderate: Secondary | ICD-10-CM | POA: Diagnosis not present

## 2021-05-07 NOTE — Assessment & Plan Note (Addendum)
Has been referred to audiology previously, patient has not been able to schedule yet Provided patient with contact information for audiology for him to call and schedule

## 2021-05-07 NOTE — Patient Instructions (Addendum)
  Medication Instructions:  Your physician recommends that you continue on your current medications as directed. Please refer to the Current Medication list given to you today. --If you need a refill on any your medications before your next appointment, please call your pharmacy first. If no refills are authorized on file call the office.--  Lab Work: Your physician has recommended that you have lab work today: TSH If you have labs (blood work) drawn today and your tests are completely normal, you will receive your results via [facility] a phone call from our staff.  Please ensure you check your voicemail in the event that you authorized detailed messages to be left on a delegated number. If you have any lab test that is abnormal or we need to change your treatment, we will call you to review the results.  Referrals/Procedures/Imaging: Please follow up with the Audiologist -- [phone]  A referral has been placed for you to Dr. Michail Sermon with [facility]. Dr. Michail Sermon is a psychologist who specializes in cognitive and behavioral therapy, he does not prescribe medications. Someone from the scheduling department will be in contact with you in regards to coordinating your consultation. If you do not hear from any of the schedulers within 7-10 business days please give their office a call at [phone]  Follow-Up: Your next appointment:   Your physician recommends that you schedule a follow-up appointment in: 4-6 WEEKS with Dr. de [place]  Thanks for letting us be apart of your health journey!!  Primary Care and Sports Medicine   Dr. Arlina Robes [place]   We encourage you to activate your patient portal called "MyChart".  Sign up information is provided on this After Visit Summary.  MyChart is used to connect with patients for Virtual Visits (Telemedicine).  Patients are able to view lab/test results, encounter notes, upcoming appointments, etc.  Non-urgent messages can be  sent to your provider as well. To learn more about what you can do with MyChart, please visit --  [email or web address].

## 2021-05-07 NOTE — Progress Notes (Signed)
    Procedures performed today:    None.  Independent interpretation of notes and tests performed by another provider:   None.  Brief History, Exam, Impression, and Recommendations:    BP (!) 144/68   Pulse 74   Ht '5\' 4"'$  (1.626 m)   Wt 173 lb 3.2 oz (78.6 kg)   SpO2 99%   BMI 29.73 kg/m   HYPOTHYROIDISM, POSTSURGICAL Recent TSH has been within normal limits, most recent reading was December 2021 No recent dose change, however patient does indicate that a few months ago he stopped taking all his medications due to thoughts of no longer wanting to be alive.  Reports that shortly thereafter he thought to himself that he would only be hurting himself worse and thus resumed medications Will check TSH today  Decreased hearing of both ears Has been referred to audiology previously, patient has not been able to schedule yet Provided patient with contact information for audiology for him to call and schedule  Depression, major, single episode, moderate ([place]) Patient still remains tearful and discusses how he would be okay with dying, however denies any suicidal ideation.  Feels that he would be okay not waking up when he goes to sleep.  Did discuss referral for counseling at last visit which he decided to proceed with, however on review it appears that psychologist office attempt to call patient 3 separate times without response.  Discussed this further again today patient is open to referral.  New referral placed for counseling services Will continue with close follow-up and monitoring of symptoms and provide assistance as able  Spent 30 minutes on this patient encounter, including preparation, chart review, face-to-face counseling with patient and coordination of care, and documentation of encounter  Plan for follow-up in about 4 to 6 weeks or sooner as needed   ___________________________________________ Tressa Maldonado de [place], MD, ABFM, [facility] Primary Care and [place]

## 2021-05-07 NOTE — Assessment & Plan Note (Signed)
Patient still remains tearful and discusses how he would be okay with dying, however denies any suicidal ideation.  Feels that he would be okay not waking up when he goes to sleep.  Did discuss referral for counseling at last visit which he decided to proceed with, however on review it appears that psychologist office attempt to call patient 3 separate times without response.  Discussed this further again today patient is open to referral.  New referral placed for counseling services Will continue with close follow-up and monitoring of symptoms and provide assistance as able

## 2021-05-07 NOTE — Assessment & Plan Note (Signed)
Recent TSH has been within normal limits, most recent reading was December 2021 No recent dose change, however patient does indicate that a few months ago he stopped taking all his medications due to thoughts of no longer wanting to be alive.  Reports that shortly thereafter he thought to himself that he would only be hurting himself worse and thus resumed medications Will check TSH today

## 2021-05-08 LAB — TSH: TSH: 4.79 u[IU]/mL — ABNORMAL HIGH (ref 0.450–4.500)

## 2021-05-14 ENCOUNTER — Telehealth (HOSPITAL_BASED_OUTPATIENT_CLINIC_OR_DEPARTMENT_OTHER): Payer: Self-pay

## 2021-05-14 MED ORDER — LEVOTHYROXINE SODIUM 137 MCG PO TABS: 125.0000 ug | ORAL_TABLET | Freq: Every day | ORAL | 1 refills | Status: DC

## 2021-05-14 NOTE — Telephone Encounter (Signed)
-----   Message from Raymond J de [place], MD sent at 05/13/2021  8:53 AM EDT ----- TSH slightly elevated above normal range.  Recommend increasing dose of levothyroxine to 137 mcg once daily and repeating TSH in 6 to 8 weeks.

## 2021-05-14 NOTE — Telephone Encounter (Signed)
Patient is aware and agreeable to tsh and increasing levothyroxine Will send rx to [facility] Repeat TSH at follow up in october

## 2021-06-02 ENCOUNTER — Ambulatory Visit: Payer: Medicare PPO

## 2021-06-05 ENCOUNTER — Other Ambulatory Visit (HOSPITAL_BASED_OUTPATIENT_CLINIC_OR_DEPARTMENT_OTHER): Payer: Self-pay | Admitting: Family Medicine

## 2021-06-18 ENCOUNTER — Ambulatory Visit (HOSPITAL_BASED_OUTPATIENT_CLINIC_OR_DEPARTMENT_OTHER): Payer: Medicare PPO | Admitting: Family Medicine

## 2021-06-19 ENCOUNTER — Ambulatory Visit (HOSPITAL_BASED_OUTPATIENT_CLINIC_OR_DEPARTMENT_OTHER): Payer: Medicare PPO | Admitting: Family Medicine

## 2021-06-19 ENCOUNTER — Other Ambulatory Visit: Payer: Self-pay

## 2021-06-19 ENCOUNTER — Encounter (HOSPITAL_BASED_OUTPATIENT_CLINIC_OR_DEPARTMENT_OTHER): Payer: Self-pay | Admitting: Family Medicine

## 2021-06-19 ENCOUNTER — Other Ambulatory Visit (HOSPITAL_BASED_OUTPATIENT_CLINIC_OR_DEPARTMENT_OTHER): Payer: Self-pay

## 2021-06-19 VITALS — BP 112/74 | HR 77 | Ht 64.0 in | Wt 176.0 lb

## 2021-06-19 DIAGNOSIS — E890 Postprocedural hypothyroidism: Secondary | ICD-10-CM | POA: Diagnosis not present

## 2021-06-19 NOTE — Patient Instructions (Signed)
  Medication Instructions:  Your physician recommends that you continue on your current medications as directed. Please refer to the Current Medication list given to you today. --If you need a refill on any your medications before your next appointment, please call your pharmacy first. If no refills are authorized on file call the office.-- Follow-Up: Your next appointment:   Your physician recommends that you schedule a follow-up appointment in: 4-6 WEEKS with Dr. de [place]  You will receive a text message or e-mail with a link to a survey about your care and experience with [place] today! We would greatly appreciate your feedback!   Thanks for letting us be apart of your health journey!!  Primary Care and Sports Medicine   Dr. Arlina Robes [place]   We encourage you to activate your patient portal called "MyChart".  Sign up information is provided on this After Visit Summary.  MyChart is used to connect with patients for Virtual Visits (Telemedicine).  Patients are able to view lab/test results, encounter notes, upcoming appointments, etc.  Non-urgent messages can be sent to your provider as well. To learn more about what you can do with MyChart, please visit --  [email or web address].

## 2021-06-20 NOTE — Progress Notes (Signed)
    Procedures performed today:    None.  Independent interpretation of notes and tests performed by another provider:   None.  Brief History, Exam, Impression, and Recommendations:    BP 112/74   Pulse 77   Ht 5\' 4"  (1.626 m)   Wt 176 lb (79.8 kg)   SpO2 96%   BMI 30.21 kg/m   HYPOTHYROIDISM, POSTSURGICAL TSH was slightly elevated at time of last appointment Dose of levothyroxine slightly increased Has been about 4 to 5 weeks of being on new dose Plan to recheck TSH at time of next office visit in 4 to 6 weeks  Long discussion today with patient regarding CODE STATUS.  Patient has expressed his desire to not have any life-sustaining measures performed in the event of being found without a pulse or not breathing.  Patient's preference is for comfort measures only.  Discussed in depth with patient in the office today and completed CODE STATUS paperwork as well as MOST form.  Plan for follow-up in about 4 to 6 weeks or sooner as needed   ___________________________________________ Jaedan Schuman de [place], MD, ABFM, [facility] Primary Care and [place]

## 2021-06-20 NOTE — Assessment & Plan Note (Signed)
TSH was slightly elevated at time of last appointment Dose of levothyroxine slightly increased Has been about 4 to 5 weeks of being on new dose Plan to recheck TSH at time of next office visit in 4 to 6 weeks

## 2021-07-02 ENCOUNTER — Other Ambulatory Visit (HOSPITAL_BASED_OUTPATIENT_CLINIC_OR_DEPARTMENT_OTHER): Payer: Self-pay | Admitting: Family Medicine

## 2021-07-02 ENCOUNTER — Other Ambulatory Visit: Payer: Self-pay

## 2021-07-02 ENCOUNTER — Ambulatory Visit (HOSPITAL_BASED_OUTPATIENT_CLINIC_OR_DEPARTMENT_OTHER): Payer: Medicare PPO | Admitting: Family Medicine

## 2021-07-02 DIAGNOSIS — F320 Major depressive disorder, single episode, mild: Secondary | ICD-10-CM

## 2021-07-02 DIAGNOSIS — F321 Major depressive disorder, single episode, moderate: Secondary | ICD-10-CM | POA: Diagnosis not present

## 2021-07-02 DIAGNOSIS — R413 Other amnesia: Secondary | ICD-10-CM | POA: Diagnosis not present

## 2021-07-02 NOTE — Progress Notes (Signed)
    Procedures performed today:    None.  Independent interpretation of notes and tests performed by another provider:   None.  Brief History, Exam, Impression, and Recommendations:    There were no vitals taken for this visit.  Depression, major, single episode, moderate ([place]) Patient continues to be tearful and discussed not wanting to wake up.  Indicates that he would be okay with dying, however does not have any specific plan or suicidal thoughts. Patient case was discussed with Dr. Michail Sermon in the office today.  Patient is open to meeting with Dr. Michail Sermon, referral will be placed for him to establish and see him in the office Plan to continue with close follow-up with patient in the office to monitor symptoms  Plan for follow-up in 1 month or sooner as needed   ___________________________________________ Dena Esperanza de [place], MD, ABFM, [facility] Primary Care and [place]

## 2021-07-02 NOTE — Assessment & Plan Note (Signed)
Patient continues to be tearful and discussed not wanting to wake up.  Indicates that he would be okay with dying, however does not have any specific plan or suicidal thoughts. Patient case was discussed with Dr. Michail Sermon in the office today.  Patient is open to meeting with Dr. Michail Sermon, referral will be placed for him to establish and see him in the office Plan to continue with close follow-up with patient in the office to monitor symptoms

## 2021-07-02 NOTE — Patient Instructions (Signed)
  Medication Instructions:  Your physician recommends that you continue on your current medications as directed. Please refer to the Current Medication list given to you today. --If you need a refill on any your medications before your next appointment, please call your pharmacy first. If no refills are authorized on file call the office.--  Follow-Up: Your next appointment:   Your physician recommends that you schedule a follow-up appointment in: 1 MONTH with Dr. de [place]  You will receive a text message or e-mail with a link to a survey about your care and experience with [place] today! We would greatly appreciate your feedback!   Thanks for letting us be apart of your health journey!!  Primary Care and Sports Medicine   Dr. Arlina Robes [place]   We encourage you to activate your patient portal called "MyChart".  Sign up information is provided on this After Visit Summary.  MyChart is used to connect with patients for Virtual Visits (Telemedicine).  Patients are able to view lab/test results, encounter notes, upcoming appointments, etc.  Non-urgent messages can be sent to your provider as well. To learn more about what you can do with MyChart, please visit --  [email or web address].

## 2021-07-04 ENCOUNTER — Ambulatory Visit (INDEPENDENT_AMBULATORY_CARE_PROVIDER_SITE_OTHER): Payer: Medicare PPO | Admitting: Psychologist

## 2021-07-04 DIAGNOSIS — F331 Major depressive disorder, recurrent, moderate: Secondary | ICD-10-CM | POA: Diagnosis not present

## 2021-07-10 ENCOUNTER — Other Ambulatory Visit: Payer: Self-pay

## 2021-07-10 DIAGNOSIS — C8330 Diffuse large B-cell lymphoma, unspecified site: Secondary | ICD-10-CM

## 2021-07-11 ENCOUNTER — Inpatient Hospital Stay: Payer: Medicare PPO | Attending: Hematology | Admitting: Hematology

## 2021-07-11 ENCOUNTER — Inpatient Hospital Stay: Payer: Medicare PPO

## 2021-07-23 ENCOUNTER — Ambulatory Visit (INDEPENDENT_AMBULATORY_CARE_PROVIDER_SITE_OTHER): Payer: Medicare PPO | Admitting: Psychologist

## 2021-07-23 DIAGNOSIS — F331 Major depressive disorder, recurrent, moderate: Secondary | ICD-10-CM | POA: Diagnosis not present

## 2021-08-04 ENCOUNTER — Ambulatory Visit (HOSPITAL_BASED_OUTPATIENT_CLINIC_OR_DEPARTMENT_OTHER): Payer: Medicare PPO | Admitting: Family Medicine

## 2021-08-05 ENCOUNTER — Ambulatory Visit (HOSPITAL_BASED_OUTPATIENT_CLINIC_OR_DEPARTMENT_OTHER): Payer: Medicare PPO | Admitting: Family Medicine

## 2021-08-05 ENCOUNTER — Encounter (HOSPITAL_BASED_OUTPATIENT_CLINIC_OR_DEPARTMENT_OTHER): Payer: Self-pay | Admitting: Family Medicine

## 2021-08-05 ENCOUNTER — Other Ambulatory Visit: Payer: Self-pay

## 2021-08-05 ENCOUNTER — Other Ambulatory Visit (HOSPITAL_BASED_OUTPATIENT_CLINIC_OR_DEPARTMENT_OTHER): Payer: Self-pay | Admitting: Family Medicine

## 2021-08-05 DIAGNOSIS — E890 Postprocedural hypothyroidism: Secondary | ICD-10-CM | POA: Diagnosis not present

## 2021-08-05 DIAGNOSIS — H9193 Unspecified hearing loss, bilateral: Secondary | ICD-10-CM | POA: Diagnosis not present

## 2021-08-05 DIAGNOSIS — F321 Major depressive disorder, single episode, moderate: Secondary | ICD-10-CM

## 2021-08-05 DIAGNOSIS — I10 Essential (primary) hypertension: Secondary | ICD-10-CM

## 2021-08-05 NOTE — Assessment & Plan Note (Addendum)
Still with depressive symptoms, continues to state that he does not want to continue living, but has no thoughts of self harm and reiterates that he would not consider taking his life due to his upbringing Had visits with Dr. Michail Sermon, he is unsure if he will continue with these Plan for close follow-up given symptoms; recommend continued follow-up with Dr. Michail Sermon

## 2021-08-05 NOTE — Patient Instructions (Signed)
  Medication Instructions:  Your physician recommends that you continue on your current medications as directed. Please refer to the Current Medication list given to you today. --If you need a refill on any your medications before your next appointment, please call your pharmacy first. If no refills are authorized on file call the office.--  Follow-Up: Your next appointment:   Your physician recommends that you schedule a follow-up appointment in: 4-6 WEEKS with Dr. de [place]  You will receive a text message or e-mail with a link to a survey about your care and experience with [place] today! We would greatly appreciate your feedback!   Thanks for letting us be apart of your health journey!!  Primary Care and Sports Medicine   Dr. Arlina Robes [place]   We encourage you to activate your patient portal called "MyChart".  Sign up information is provided on this After Visit Summary.  MyChart is used to connect with patients for Virtual Visits (Telemedicine).  Patients are able to view lab/test results, encounter notes, upcoming appointments, etc.  Non-urgent messages can be sent to your provider as well. To learn more about what you can do with MyChart, please visit --  [email or web address].

## 2021-08-05 NOTE — Progress Notes (Signed)
    Procedures performed today:    None.  Independent interpretation of notes and tests performed by another provider:   None.  Brief History, Exam, Impression, and Recommendations:    BP 117/64   Pulse 67   Ht 5\' 4"  (1.626 m)   Wt 171 lb (77.6 kg)   SpO2 99%   BMI 29.35 kg/m   Essential hypertension Has stopped taking medications except for levothyroxine. Blood pressure at goal in office despite being off of the benazepril. Feel it would be reasonable to continue to hold off on this at this time and continue with monitoring blood pressure  Depression, major, single episode, moderate (HCC) Still with depressive symptoms, continues to state that he does not want to continue living, but has no thoughts of self harm and reiterates that he would not consider taking his life due to his upbringing Had visits with Dr. Michail Sermon, he is unsure if he will continue with these Plan for close follow-up given symptoms; recommend continued follow-up with Dr. Lambert Mody, POSTSURGICAL Has discontinued most medications but continues with levothyroxine Recommend continuing with levothyroxine Plan to recheck TSH at next visit as last check was slightly above normal range  Decreased hearing of both ears Had been referred to audiology previously, but was unable to move forward with referral Discussed options - he is planning to go to local pharmacy retailer to inquire about hering aids. Offered another referral to audiology - he is interested, although reports transportation is a concern  Plan for follow-up in about 4-6 weeks or sooner as needed   ___________________________________________ Onetta Spainhower de [place], MD, ABFM, CAQSM Primary Care and [place]

## 2021-08-05 NOTE — Assessment & Plan Note (Addendum)
Has discontinued most medications but continues with levothyroxine Recommend continuing with levothyroxine Plan to recheck TSH at next visit as last check was slightly above normal range

## 2021-08-05 NOTE — Assessment & Plan Note (Signed)
Has stopped taking medications except for levothyroxine. Blood pressure at goal in office despite being off of the benazepril. Feel it would be reasonable to continue to hold off on this at this time and continue with monitoring blood pressure

## 2021-08-05 NOTE — Assessment & Plan Note (Signed)
Had been referred to audiology previously, but was unable to move forward with referral Discussed options - he is planning to go to local pharmacy retailer to inquire about hering aids. Offered another referral to audiology - he is interested, although reports transportation is a concern

## 2021-08-28 ENCOUNTER — Other Ambulatory Visit (HOSPITAL_BASED_OUTPATIENT_CLINIC_OR_DEPARTMENT_OTHER): Payer: Self-pay | Admitting: Family Medicine

## 2021-09-15 ENCOUNTER — Emergency Department (HOSPITAL_COMMUNITY)
Admission: EM | Admit: 2021-09-15 | Discharge: 2021-09-15 | Disposition: A | Discharge status: Home / Self Care | Payer: Medicare PPO | Attending: Student | Admitting: Student

## 2021-09-15 ENCOUNTER — Emergency Department (HOSPITAL_COMMUNITY): Payer: Medicare PPO

## 2021-09-15 ENCOUNTER — Encounter (HOSPITAL_COMMUNITY): Payer: Self-pay

## 2021-09-15 DIAGNOSIS — S6991XA Unspecified injury of right wrist, hand and finger(s), initial encounter: Secondary | ICD-10-CM | POA: Diagnosis present

## 2021-09-15 DIAGNOSIS — E039 Hypothyroidism, unspecified: Secondary | ICD-10-CM | POA: Diagnosis not present

## 2021-09-15 DIAGNOSIS — Z96642 Presence of left artificial hip joint: Secondary | ICD-10-CM | POA: Insufficient documentation

## 2021-09-15 DIAGNOSIS — R531 Weakness: Secondary | ICD-10-CM | POA: Diagnosis not present

## 2021-09-15 DIAGNOSIS — I10 Essential (primary) hypertension: Secondary | ICD-10-CM | POA: Diagnosis not present

## 2021-09-15 DIAGNOSIS — Z23 Encounter for immunization: Secondary | ICD-10-CM | POA: Diagnosis not present

## 2021-09-15 DIAGNOSIS — S61312A Laceration without foreign body of right middle finger with damage to nail, initial encounter: Secondary | ICD-10-CM | POA: Insufficient documentation

## 2021-09-15 DIAGNOSIS — S62632B Displaced fracture of distal phalanx of right middle finger, initial encounter for open fracture: Secondary | ICD-10-CM | POA: Diagnosis not present

## 2021-09-15 DIAGNOSIS — Y92094 Garage of other non-institutional residence as the place of occurrence of the external cause: Secondary | ICD-10-CM | POA: Diagnosis not present

## 2021-09-15 DIAGNOSIS — R5381 Other malaise: Secondary | ICD-10-CM | POA: Diagnosis not present

## 2021-09-15 DIAGNOSIS — S62639B Displaced fracture of distal phalanx of unspecified finger, initial encounter for open fracture: Secondary | ICD-10-CM

## 2021-09-15 DIAGNOSIS — W232XXA Caught, crushed, jammed or pinched between a moving and stationary object, initial encounter: Secondary | ICD-10-CM | POA: Diagnosis not present

## 2021-09-15 DIAGNOSIS — S62632A Displaced fracture of distal phalanx of right middle finger, initial encounter for closed fracture: Secondary | ICD-10-CM | POA: Diagnosis not present

## 2021-09-15 DIAGNOSIS — Z87891 Personal history of nicotine dependence: Secondary | ICD-10-CM | POA: Insufficient documentation

## 2021-09-15 DIAGNOSIS — S61212A Laceration without foreign body of right middle finger without damage to nail, initial encounter: Secondary | ICD-10-CM | POA: Diagnosis not present

## 2021-09-15 DIAGNOSIS — R52 Pain, unspecified: Secondary | ICD-10-CM

## 2021-09-15 MED ORDER — LIDOCAINE HCL (PF) 1 % IJ SOLN
5.0000 mL | Freq: Once | INTRAMUSCULAR | Status: AC
  Administered 2021-09-15: 5 mL
  Filled 2021-09-15: qty 30

## 2021-09-15 MED ORDER — TETANUS-DIPHTH-ACELL PERTUSSIS 5-2.5-18.5 LF-MCG/0.5 IM SUSY
0.5000 mL | PREFILLED_SYRINGE | Freq: Once | INTRAMUSCULAR | Status: AC
  Administered 2021-09-15: 0.5 mL via INTRAMUSCULAR
  Filled 2021-09-15: qty 0.5

## 2021-09-15 MED ORDER — DOXYCYCLINE HYCLATE 100 MG PO TABS
100.0000 mg | ORAL_TABLET | Freq: Once | ORAL | Status: AC
  Administered 2021-09-15: 100 mg via ORAL
  Filled 2021-09-15: qty 1

## 2021-09-15 MED ORDER — DOXYCYCLINE HYCLATE 100 MG PO CAPS: 100.0000 mg | ORAL_CAPSULE | Freq: Two times a day (BID) | ORAL | 0 refills | Status: DC

## 2021-09-15 NOTE — ED Triage Notes (Signed)
Per EMS- patient had a laceration to his right middle finger that occurred a few days ago. No bleeding noted at this time.  When Probation officer asked patient how this happened he said, "Let me think." It took patient a few moments to answer. Patient is very [place].

## 2021-09-15 NOTE — TOC Initial Note (Addendum)
Transition of Care [facility]) - Initial/Assessment Note    Patient Details  Name: Jay Webster MRN: [id number] Date of Birth: 1932-07-20  Transition of Care [facility]) CM/SW Contact:    Roseanne Kaufman, RN Phone Number: 09/15/2021, 5:50 PM  Clinical Narrative:                 Patient presents to ED due to laceration to middle finger caused by garage door injury within the last 24 hours.  ED RNCM spoke with patient at bedside regarding [facility] consult. Patient needs assistance getting an appointment to surgeon Dr. Lenon Curt. RNCM will continue to follow to coordinate appointment. Patient is HOH and reports he uses a cane at home and continues to drive.    Barriers to Discharge: No Barriers Identified   Patient Goals and CMS Choice  Patient wants to be discharged to home      Expected Discharge Plan and Services    Safe discharge with follow up appointment with Dr. Lenon Curt. EDP request appointment tomorrow however it is after hours.        Prior Living Arrangements/Services  Patient resides at home alone   Activities of Daily Living  Independent    Permission Sought/Granted      Emotional Assessment    Patient feeling discouraged due to hurting his hand. His close friend Marguerita Beards came to ED while this ED RNCM was speaking with patient. Patient was very excited to see Hilda Blades.    Admission diagnosis:  lac right middle finger Patient Active Problem List   Diagnosis Date Noted   Depression, major, single episode, moderate ([place]) 03/26/2021   Decreased hearing of both ears 03/26/2021   Decreased visual acuity 03/26/2021   Pulmonary emphysema, unspecified emphysema type ([place]) 02/19/2020   Atherosclerosis of aorta ([place]) 02/19/2020   Memory loss 02/19/2020   Former smoker 08/16/2019   Depression, major, single episode, mild ([place]) 08/16/2019   Diffuse large B-cell lymphoma ([place]) 03/30/2018   Degeneration of lumbar intervertebral disc 10/05/2017   History of total hip arthroplasty  10/05/2017   OA (osteoarthritis) of hip 05/31/2013   History of thyroid cancer 04/21/2011   Macular degeneration 01/05/2011   HYPOTHYROIDISM, POSTSURGICAL 11/20/2008   Osteoarthritis 07/01/2007   Uric acid renal calculus 06/20/2007   Essential hypertension 06/20/2007   PCP:  de [place], Raymond J, MD Pharmacy:   [facility], [place] [address] Phone: [phone] Fax: [phone]  [id number] [address], [place] [address] Phone: [phone] Fax: [phone]     Social Determinants of Health (SDOH) Interventions    Readmission Risk Interventions No flowsheet data found.

## 2021-09-15 NOTE — ED Provider Notes (Signed)
[facility] DEPT Provider Note  CSN: [id number] Arrival date & time: 09/15/21 1301  Chief Complaint(s) Hand Pain  HPI Jay Webster is a 88 y.o. male who presents the emergency department for evaluation of a right hand injury.  Patient states that approximately 24 hours ago his hand got stuck and the garage door.  He arrives with a matted bandage over the third digit on the right with a laceration circumferentially around the digit and bruising under the fingernail.  Denies numbness, tingling, weakness of the hand.  Denies additional traumatic or systemic complaints  Hand Pain   Past Medical History Past Medical History:  Diagnosis Date   Arthritis    OA AND PAIN LEFT HIP AND OTHER JOINT PAINS   Cancer ([place])    THYROID CANCER - HAD THYROID REMOVED   History of kidney stones    History of shingles    RESOLVED   Hypertension    Hypothyroidism    Low back pain    Macular degeneration of both eyes    RBBB (right bundle branch block with left anterior fascicular block)    Seasonal allergies    Thyroid disease    Uric acid renal calculus 06/20/2007   Qualifier: Diagnosis of  By: Rogue Bussing CMA, Jacqualynn     Patient Active Problem List   Diagnosis Date Noted   Depression, major, single episode, moderate ([place]) 03/26/2021   Decreased hearing of both ears 03/26/2021   Decreased visual acuity 03/26/2021   Pulmonary emphysema, unspecified emphysema type ([place]) 02/19/2020   Atherosclerosis of aorta ([place]) 02/19/2020   Memory loss 02/19/2020   Former smoker 08/16/2019   Depression, major, single episode, mild ([place]) 08/16/2019   Diffuse large B-cell lymphoma ([place]) 03/30/2018   Degeneration of lumbar intervertebral disc 10/05/2017   History of total hip arthroplasty 10/05/2017   OA (osteoarthritis) of hip 05/31/2013   History of thyroid cancer 04/21/2011   Macular degeneration 01/05/2011   HYPOTHYROIDISM, POSTSURGICAL 11/20/2008   Osteoarthritis  07/01/2007   Uric acid renal calculus 06/20/2007   Essential hypertension 06/20/2007   Home Medication(s) Prior to Admission medications   Medication Sig Start Date End Date Taking? Authorizing Provider  doxycycline (VIBRAMYCIN) 100 MG capsule Take 1 capsule (100 mg total) by mouth 2 (two) times daily. 09/15/21  Yes Akansha Wyche, MD  levothyroxine (SYNTHROID) 137 MCG tablet TAKE 1 TABLET BY MOUTH EVERY DAY 08/28/21   de [place], Raymond J, MD                                                                                                                                    Past Surgical History Past Surgical History:  Procedure Laterality Date   APPENDECTOMY  1948   CATARACT EXTRACTION Right 08/27/2019   COLONOSCOPY     Elbow Radical Reduction  1973   IR IMAGING GUIDED PORT INSERTION  05/05/2018   IR REMOVAL TUN ACCESS  W/ PORT W/O FL MOD SED  10/18/2018   KIDNEY STONE SURGERY  1971, 1976   MASS EXCISION N/A 03/11/2018   Procedure: EXCISION MASS OF PERINEUM;  Surgeon: Armandina Gemma, MD;  Location: [facility];  Service: General;  Laterality: N/A;   REMOVAL OF FIRST RIB   [place] BETWEEN 1 ST RIB AND COLLARBONE   Rib removed, [place]  2009   THYROIDECTOMY  2010   TOTAL HIP ARTHROPLASTY Left 05/31/2013   Procedure: LEFT TOTAL HIP ARTHROPLASTY ANTERIOR APPROACH;  Surgeon: Gearlean Alf, MD;  Location: [facility];  Service: Orthopedics;  Laterality: Left;   Family History Family History  Problem Relation Age of Onset   Heart disease Mother        passed in 46s   Diabetes Mother    Alcohol abuse Father        passed from this   Parkinson's disease Sister    Diabetes Brother    Dementia Sister    Cancer Brother        sounds like bone cancer- non healing knee injury    Social History Social History   Tobacco Use   Smoking status: Former    Types: Cigarettes    Quit date: 02/22/1979    Years since quitting: 42.5   Smokeless tobacco: Never   Vaping Use   Vaping Use: Never used  Substance Use Topics   Alcohol use: Yes    Alcohol/week: 0.0 - 1.0 standard drinks   Drug use: No   Allergies Penicillins, Shingrix [zoster vac recomb adjuvanted], and Zoloft [sertraline hcl]  Review of Systems Review of Systems  Musculoskeletal:  Positive for arthralgias.  Skin:  Positive for wound.   Physical Exam Vital Signs  I have reviewed the triage vital signs BP 132/87    Pulse 68    Temp 98.6 F (37 C) (Oral)    Resp 18    SpO2 100%   Physical Exam Vitals and nursing note reviewed.  Constitutional:      General: He is not in acute distress.    Appearance: He is well-developed.  HENT:     Head: Normocephalic and atraumatic.  Eyes:     Conjunctiva/sclera: Conjunctivae normal.  Cardiovascular:     Rate and Rhythm: Normal rate and regular rhythm.     Heart sounds: No murmur heard. Pulmonary:     Effort: Pulmonary effort is normal. No respiratory distress.     Breath sounds: Normal breath sounds.  Abdominal:     Palpations: Abdomen is soft.     Tenderness: There is no abdominal tenderness.  Musculoskeletal:        General: Swelling, tenderness and signs of injury (4 cm circumferential laceration around the distal finger pad on the right third digit, bruising noted under the fingernail) present.     Cervical back: Neck supple.  Skin:    General: Skin is warm and dry.     Capillary Refill: Capillary refill takes less than 2 seconds.     Findings: Lesion present.  Neurological:     Mental Status: He is alert.  Psychiatric:        Mood and Affect: Mood normal.    ED Results and Treatments Labs (all labs ordered are listed, but only abnormal results are displayed) Labs Reviewed - No data to display  Radiology DG Hand Complete Right  Result Date: 09/15/2021 CLINICAL DATA:  Right middle finger laceration.  EXAM: RIGHT HAND - COMPLETE 3+ VIEW COMPARISON:  None. FINDINGS: Mildly displaced and comminuted fracture is seen involving distal tuft of third distal phalanx. No other bony abnormality is noted. Joint spaces are intact. IMPRESSION: Mildly displaced and comminuted distal tuft fracture of third distal phalanx. Electronically Signed   By: Marijo Conception M.D.   On: 09/15/2021 15:00    Pertinent labs & imaging results that were available during my care of the patient were reviewed by me and considered in my medical decision making (see MDM for details).  Medications Ordered in ED Medications  doxycycline (VIBRA-TABS) tablet 100 mg (has no administration in time range)  lidocaine (PF) (XYLOCAINE) 1 % injection 5 mL (5 mLs Other Given by Other 09/15/21 1602)                                                                                                                                     Procedures .Marland KitchenLaceration Repair  Date/Time: 09/15/2021 4:54 PM Performed by: Teressa Lower, MD Authorized by: Teressa Lower, MD   Anesthesia:    Anesthesia method:  Nerve block   Block location:  Third digit right   Block needle gauge:  24 G   Block anesthetic:  Lidocaine 1% w/o epi   Block technique:  Digital block   Block injection procedure:  Introduced needle   Block outcome:  Anesthesia achieved Laceration details:    Location:  Finger   Finger location:  R long finger   Length (cm):  4 Pre-procedure details:    Preparation:  Patient was prepped and draped in usual sterile fashion Exploration:    Imaging obtained: x-ray     Wound extent: underlying fracture     Contaminated: no   Treatment:    Area cleansed with:  Soap and water   Amount of cleaning:  Extensive   Irrigation solution:  Sterile saline   Irrigation volume:  1L   Visualized foreign bodies/material removed: no     Debridement:  Minimal Skin repair:    Repair method:  Sutures   Suture size:  4-0   Suture material:  Prolene    Suture technique:  Simple interrupted   Number of sutures:  4 Approximation:    Approximation:  Loose Repair type:    Repair type:  Intermediate Post-procedure details:    Dressing:  Antibiotic ointment and non-adherent dressing   Procedure completion:  Tolerated well, no immediate complications .Nail Removal  Date/Time: 09/15/2021 4:56 PM Performed by: Teressa Lower, MD Authorized by: Teressa Lower, MD   Consent:    Consent obtained:  Verbal   Consent given by:  Patient   Risks discussed:  Bleeding, incomplete removal, infection, pain and permanent nail deformity   Alternatives discussed:  No treatment and delayed treatment Location:    Hand:  R long  finger Pre-procedure details:    Skin preparation:  Chlorhexidine Anesthesia:    Anesthesia method:  Nerve block   Block anesthetic:  Lidocaine 1% w/o epi Nail Removal:    Nail removed:  Complete   Nail bed repaired: yes     Nail bed suture material: 6-0 prolene.   Number of sutures:  3 Post-procedure details:    Dressing:  Splint   Procedure completion:  Tolerated well, no immediate complications .Marland KitchenLaceration Repair  Date/Time: 09/15/2021 4:58 PM Performed by: Teressa Lower, MD Authorized by: Teressa Lower, MD   Laceration details:    Location:  Finger   Finger location: nailbed, R long finger.   Length (cm):  1 Treatment:    Area cleansed with:  Soap and water   Amount of cleaning:  Extensive Skin repair:    Repair method:  Sutures   Suture size:  6-0   Suture material:  Prolene   Suture technique:  Simple interrupted   Number of sutures:  3 Approximation:    Approximation:  Close Repair type:    Repair type:  Intermediate Post-procedure details:    Dressing:  Non-adherent dressing   Procedure completion:  Tolerated well, no immediate complications  (including critical care time)  Medical Decision Making / ED Course   This patient presents to the ED for concern of a finger injury and laceration,  this involves an extensive number of treatment options, and is a complaint that carries with it a high risk of complications and morbidity.  The differential diagnosis includes fracture, retained foreign body, laceration, nailbed injury   Additional history obtained: -External records from outside source obtained and reviewed including: Chart review including previous notes, labs, imaging, consultation notes   Imaging Studies ordered: -I ordered imaging studies including x-rays of the finger -I independently visualized and interpreted imaging which showed distal tuft fracture of the third digit -I agree with the radiologist interpretation   Medicines ordered and prescription drug management: -I ordered medication including lidocaine for a digital block of the finger -Reevaluation of the patient after these medicines showed that the patient improved -I have reviewed the patients home medicines and have made adjustments as needed   Consultations Obtained: I requested consultation with the hand surgeon Dr. Freada Bergeron,  and discussed lab and imaging findings as well as pertinent plan - they recommend: Outpatient follow-up tomorrow in his office   ED Course: Patient seen emergency department for evaluation of a finger injury.  X-ray with distal tuft fracture.  As the injury is 55 hours old, lacerations circumferentially on the palmar aspect of the hand are closed with loose approximation.  The nailbed was removed and an additional laceration was discovered and this was repaired as well.  Patient was splinted in extension.  Doxycycline ordered today and patient will be discharged with a prescription of doxycycline as he is allergic to penicillins.  Patient will follow-up outpatient hand.  Tetanus updated   Reevaluation: After the interventions noted above, I reevaluated the patient and found that they have :improved   Dispostion: Discharge with outpatient hand followup      Final Clinical  Impression(s) / ED Diagnoses Final diagnoses:  Pain  Laceration of right middle finger without foreign body with damage to nail, initial encounter  Open fracture of tuft of distal phalanx of finger     @PCDICTATION @    Teressa Lower, MD 09/15/21 1705

## 2021-09-16 ENCOUNTER — Ambulatory Visit (HOSPITAL_BASED_OUTPATIENT_CLINIC_OR_DEPARTMENT_OTHER): Payer: Medicare PPO | Admitting: Family Medicine

## 2021-09-16 ENCOUNTER — Encounter (HOSPITAL_BASED_OUTPATIENT_CLINIC_OR_DEPARTMENT_OTHER): Payer: Self-pay | Admitting: Family Medicine

## 2021-09-16 ENCOUNTER — Other Ambulatory Visit: Payer: Self-pay

## 2021-09-16 ENCOUNTER — Telehealth: Payer: Self-pay

## 2021-09-16 ENCOUNTER — Ambulatory Visit (INDEPENDENT_AMBULATORY_CARE_PROVIDER_SITE_OTHER): Payer: Medicare PPO | Admitting: Family Medicine

## 2021-09-16 VITALS — BP 130/68 | HR 85 | Ht 64.0 in | Wt 171.0 lb

## 2021-09-16 DIAGNOSIS — S62639B Displaced fracture of distal phalanx of unspecified finger, initial encounter for open fracture: Secondary | ICD-10-CM | POA: Diagnosis not present

## 2021-09-16 DIAGNOSIS — E890 Postprocedural hypothyroidism: Secondary | ICD-10-CM

## 2021-09-16 NOTE — Assessment & Plan Note (Signed)
Recent emergency department visit for above issue.  Had nail removed and suturing of the laceration completed.  Was also found to have fracture on imaging with follow-up with orthopedic surgeon recommended, appointment is scheduled for tomorrow Wound feels intact, sutures in place, no discharge or other abnormality present at this time Recommend close follow-up with orthopedic surgeon given open fracture, discussed with patient and friend who was present for today's office visit

## 2021-09-16 NOTE — Assessment & Plan Note (Signed)
Recent TSH was slightly elevated and dose change was made for levothyroxine Due for recheck of TSH to assess response to therapy Will check TSH today, adjust dose of levothyroxine as needed based on laboratory findings

## 2021-09-16 NOTE — Patient Instructions (Signed)
°  Medication Instructions:  Your physician recommends that you continue on your current medications as directed. Please refer to the Current Medication list given to you today. --If you need a refill on any your medications before your next appointment, please call your pharmacy first. If no refills are authorized on file call the office.-- Lab Work: Your physician has recommended that you have lab work today: TSH If you have labs (blood work) drawn today and your tests are completely normal, you will receive your results via [facility] a phone call from our staff.  Please ensure you check your voicemail in the event that you authorized detailed messages to be left on a delegated number. If you have any lab test that is abnormal or we need to change your treatment, we will call you to review the results.  Follow-Up: Your next appointment:   Your physician recommends that you schedule a follow-up appointment in: 4-6 WEEKS with Dr. de [place]  You will receive a text message or e-mail with a link to a survey about your care and experience with [place] today! We would greatly appreciate your feedback!   Thanks for letting us be apart of your health journey!!  Primary Care and Sports Medicine   Dr. Arlina Robes [place]   We encourage you to activate your patient portal called "MyChart".  Sign up information is provided on this After Visit Summary.  MyChart is used to connect with patients for Virtual Visits (Telemedicine).  Patients are able to view lab/test results, encounter notes, upcoming appointments, etc.  Non-urgent messages can be sent to your provider as well. To learn more about what you can do with MyChart, please visit --  [email or web address].

## 2021-09-16 NOTE — Progress Notes (Signed)
° ° °  Procedures performed today:    None.  Independent interpretation of notes and tests performed by another provider:   None.  Brief History, Exam, Impression, and Recommendations:    BP 130/68    Pulse 85    Ht 5\' 4"  (1.626 m)    Wt 171 lb (77.6 kg)    SpO2 98%    BMI 29.35 kg/m   HYPOTHYROIDISM, POSTSURGICAL Recent TSH was slightly elevated and dose change was made for levothyroxine Due for recheck of TSH to assess response to therapy Will check TSH today, adjust dose of levothyroxine as needed based on laboratory findings  Open fracture of tuft of distal phalanx of finger Recent emergency department visit for above issue.  Had nail removed and suturing of the laceration completed.  Was also found to have fracture on imaging with follow-up with orthopedic surgeon recommended, appointment is scheduled for tomorrow Wound feels intact, sutures in place, no discharge or other abnormality present at this time Recommend close follow-up with orthopedic surgeon given open fracture, discussed with patient and friend who was present for today's office visit  Plan for follow-up in about 4 to 6 weeks   ___________________________________________ Jay Ohagan de [place], MD, ABFM, CAQSM Primary Care and [place]

## 2021-09-16 NOTE — Telephone Encounter (Signed)
RNCM spoke with patient to advise of follow up appointment with Dr. Lenon Curt ph# [phone]. Patient put friend Jay Webster on to discuss appointment. Jay Webster indicated patient has an appointment tomorrow at 11:30am.  RNCM called Dr. Donnal Moat office back to confirm and they only have the 8:45am appt. This RNCM called patient and friend Jay Webster, however received no answer. This RNCM left voicemail message on patient's phone to call Dr. Lenon Curt to confirm the appointment this is a different appointment type. He has 2 different appointments scheduled on 09/17/21. No additional TOC needs

## 2021-09-17 ENCOUNTER — Inpatient Hospital Stay: Payer: Medicare PPO | Attending: Hematology | Admitting: Hematology

## 2021-09-17 ENCOUNTER — Inpatient Hospital Stay: Payer: Medicare PPO

## 2021-09-17 VITALS — BP 132/67 | HR 66 | Temp 97.1°F | Resp 17 | Wt 171.1 lb

## 2021-09-17 DIAGNOSIS — C8330 Diffuse large B-cell lymphoma, unspecified site: Secondary | ICD-10-CM

## 2021-09-17 DIAGNOSIS — I10 Essential (primary) hypertension: Secondary | ICD-10-CM | POA: Insufficient documentation

## 2021-09-17 DIAGNOSIS — S67192A Crushing injury of right middle finger, initial encounter: Secondary | ICD-10-CM | POA: Diagnosis not present

## 2021-09-17 LAB — CBC WITH DIFFERENTIAL (CANCER CENTER ONLY)
Abs Immature Granulocytes: 0.02 10*3/uL (ref 0.00–0.07)
Basophils Absolute: 0 10*3/uL (ref 0.0–0.1)
Basophils Relative: 1 %
Eosinophils Absolute: 0.1 10*3/uL (ref 0.0–0.5)
Eosinophils Relative: 2 %
HCT: 34.3 % — ABNORMAL LOW (ref 39.0–52.0)
Hemoglobin: 12.2 g/dL — ABNORMAL LOW (ref 13.0–17.0)
Immature Granulocytes: 0 %
Lymphocytes Relative: 21 %
Lymphs Abs: 1.1 10*3/uL (ref 0.7–4.0)
MCH: 30.6 pg (ref 26.0–34.0)
MCHC: 35.6 g/dL (ref 30.0–36.0)
MCV: 86 fL (ref 80.0–100.0)
Monocytes Absolute: 0.5 10*3/uL (ref 0.1–1.0)
Monocytes Relative: 9 %
Neutro Abs: 3.4 10*3/uL (ref 1.7–7.7)
Neutrophils Relative %: 67 %
Platelet Count: 202 10*3/uL (ref 150–400)
RBC: 3.99 MIL/uL — ABNORMAL LOW (ref 4.22–5.81)
RDW: 12.8 % (ref 11.5–15.5)
WBC Count: 5.1 10*3/uL (ref 4.0–10.5)
nRBC: 0 % (ref 0.0–0.2)

## 2021-09-17 LAB — CMP (CANCER CENTER ONLY)
ALT: 9 U/L (ref 0–44)
AST: 12 U/L — ABNORMAL LOW (ref 15–41)
Albumin: 3.7 g/dL (ref 3.5–5.0)
Alkaline Phosphatase: 70 U/L (ref 38–126)
Anion gap: 7 (ref 5–15)
BUN: 17 mg/dL (ref 8–23)
CO2: 25 mmol/L (ref 22–32)
Calcium: 9 mg/dL (ref 8.9–10.3)
Chloride: 107 mmol/L (ref 98–111)
Creatinine: 0.77 mg/dL (ref 0.61–1.24)
GFR, Estimated: >60 mL/min (ref >60)
Glucose, Bld: 104 mg/dL — ABNORMAL HIGH (ref 70–99)
Potassium: 4 mmol/L (ref 3.5–5.1)
Sodium: 139 mmol/L (ref 135–145)
Total Bilirubin: 0.6 mg/dL (ref 0.3–1.2)
Total Protein: 6.9 g/dL (ref 6.5–8.1)

## 2021-09-17 LAB — T4F: T4,Free (Direct): 2.21 ng/dL — ABNORMAL HIGH (ref 0.82–1.77)

## 2021-09-17 LAB — TSH RFX ON ABNORMAL TO FREE T4: TSH: 0.141 u[IU]/mL — ABNORMAL LOW (ref 0.450–4.500)

## 2021-09-17 LAB — LACTATE DEHYDROGENASE: LDH: 123 U/L (ref 98–192)

## 2021-09-20 ENCOUNTER — Other Ambulatory Visit (HOSPITAL_BASED_OUTPATIENT_CLINIC_OR_DEPARTMENT_OTHER): Payer: Self-pay | Admitting: Family Medicine

## 2021-09-23 NOTE — Progress Notes (Signed)
HEMATOLOGY/ONCOLOGY CLINIC NOTE  Date of Service: .09/17/2021   Patient Care Team: de [place], Blondell Reveal, MD as PCP - General (Family Medicine) Brunetta Genera, MD as Consulting Physician (Hematology) Justice Britain, MD as Consulting Physician (Orthopedic Surgery) Armandina Gemma, MD as Consulting Physician (General Surgery) Zadie Rhine Clent Demark, MD as Consulting Physician (Ophthalmology) Suella Broad, MD as Consulting Physician (Physical Medicine and Rehabilitation) Zebedee Iba., MD as Referring Physician (Ophthalmology) Gaynelle Arabian, MD as Consulting Physician (Orthopedic Surgery)  CHIEF COMPLAINTS/PURPOSE OF CONSULTATION:  Follow-up for active surveillance of diffuse large B-cell lymphoma  HISTORY OF PRESENTING ILLNESS:  Please see previous notes for details on initial presentation  INTERVAL HISTORY:    Jay Webster is here for follow-up to continue active surveillance and evaluation for diffuse large B-cell lymphoma.  His last clinic visit with [place] was in May 2022.  He is accompanied by one of his friends/neighbor for this clinic visit. He notes no acute new concerns since his last clinic visit.  No fevers no chills no night sweats. No unexpected weight loss. No new lumps or bumps noted.  Continues to have some issues with cognitive decline but continues to prefer living in his own home with help from his friends and neighbors.  He has previously been connected with the Education officer, museum and declined resources/help with consideration of assisted living placement.  No other acute new concerns. Labs done today reviewed with the patient.  MEDICAL HISTORY:   Past Medical History:  Diagnosis Date   Arthritis    OA AND PAIN LEFT HIP AND OTHER JOINT PAINS   Cancer ([place])    THYROID CANCER - HAD THYROID REMOVED   History of kidney stones    History of shingles    RESOLVED   Hypertension    Hypothyroidism    Low back pain    Macular degeneration of both eyes    RBBB  (right bundle branch block with left anterior fascicular block)    Seasonal allergies    Thyroid disease    Uric acid renal calculus 06/20/2007   Qualifier: Diagnosis of  By: Rogue Bussing CMA, Jacqualynn      SURGICAL HISTORY: Past Surgical History:  Procedure Laterality Date   APPENDECTOMY  1948   CATARACT EXTRACTION Right 08/27/2019   COLONOSCOPY     Elbow Radical Reduction  1973   IR IMAGING GUIDED PORT INSERTION  05/05/2018   IR REMOVAL TUN ACCESS W/ PORT W/O FL MOD SED  10/18/2018   KIDNEY STONE SURGERY  1971, 1976   MASS EXCISION N/A 03/11/2018   Procedure: EXCISION MASS OF PERINEUM;  Surgeon: Armandina Gemma, MD;  Location: [facility];  Service: General;  Laterality: N/A;   REMOVAL OF FIRST RIB   [place] BETWEEN 1 ST RIB AND COLLARBONE   Rib removed, [place]  2009   THYROIDECTOMY  2010   TOTAL HIP ARTHROPLASTY Left 05/31/2013   Procedure: LEFT TOTAL HIP ARTHROPLASTY ANTERIOR APPROACH;  Surgeon: Gearlean Alf, MD;  Location: [facility];  Service: Orthopedics;  Laterality: Left;    SOCIAL HISTORY: Social History   Socioeconomic History   Marital status: Widowed    Spouse name: Not on file   Number of children: Not on file   Years of education: Not on file   Highest education level: Not on file  Occupational History   Occupation: retired  Tobacco Use   Smoking status: Former  Types: Cigarettes    Quit date: 02/22/1979    Years since quitting: 42.6   Smokeless tobacco: Never  Vaping Use   Vaping Use: Never used  Substance and Sexual Activity   Alcohol use: Yes    Alcohol/week: 0.0 - 1.0 standard drinks   Drug use: No   Sexual activity: Not Currently  Other Topics Concern   Not on file  Social History Narrative   Lives alone and 1 cat. Divorced from mongomous long term partner and eventual husband. No children from prior relationships.       Retired Geologist, engineering: socializing, used to play tennis   Social  Determinants of Radio broadcast assistant Strain: Not on [employer] Insecurity: Not on file  Transportation Needs: Not on file  Physical Activity: Not on file  Stress: Not on file  Social Connections: Not on file  Intimate Partner Violence: Not on file    FAMILY HISTORY: Family History  Problem Relation Age of Onset   Heart disease Mother        passed in 50s   Diabetes Mother    Alcohol abuse Father        passed from this   Parkinson's disease Sister    Diabetes Brother    Dementia Sister    Cancer Brother        sounds like bone cancer- non healing knee injury    ALLERGIES:  is allergic to penicillins, shingrix [zoster vac recomb adjuvanted], and zoloft [sertraline hcl].  MEDICATIONS:  Current Outpatient Medications  Medication Sig Dispense Refill   doxycycline (VIBRAMYCIN) 100 MG capsule Take 1 capsule (100 mg total) by mouth 2 (two) times daily. 20 capsule 0   levothyroxine (SYNTHROID) 125 MCG tablet Take 1 tablet (125 mcg total) by mouth daily. 30 tablet 1   No current facility-administered medications for this visit.    REVIEW OF SYSTEMS:   .10 Point review of Systems was done is negative except as noted above.    PHYSICAL EXAMINATION: ECOG FS:2 - Symptomatic, <50% confined to bed  Vitals:   09/17/21 1139  BP: 132/67  Pulse: 66  Resp: 17  Temp: (!) 97.1 F (36.2 C)  SpO2: 100%   Wt Readings from Last 3 Encounters:  09/17/21 171 lb 1.6 oz (77.6 kg)  09/16/21 171 lb (77.6 kg)  08/05/21 171 lb (77.6 kg)   Body mass index is 29.37 kg/m.   Marland Kitchen GENERAL:alert, in no acute distress and comfortable SKIN: no acute rashes, no significant lesions EYES: conjunctiva are pink and non-injected, sclera anicteric OROPHARYNX: MMM, no exudates, no oropharyngeal erythema or ulceration NECK: supple, no JVD LYMPH:  no palpable lymphadenopathy in the cervical, axillary or inguinal regions LUNGS: clear to auscultation b/l with normal respiratory effort HEART:  regular rate & rhythm ABDOMEN:  normoactive bowel sounds , non tender, not distended. Extremity: no pedal edema PSYCH: alert & oriented x 3 with fluent speech NEURO: no focal motor/sensory deficits   LABORATORY DATA:  I have reviewed the data as listed  . CBC Latest Ref Rng & Units 09/17/2021 01/08/2021 07/10/2020  WBC 4.0 - 10.5 K/uL 5.1 4.3 5.4  Hemoglobin 13.0 - 17.0 g/dL 12.2(L) 12.5(L) 11.8(L)  Hematocrit 39.0 - 52.0 % 34.3(L) 36.5(L) 35.8(L)  Platelets 150 - 400 K/uL 202 208 192   . CBC    Component Value Date/Time   WBC 5.1 09/17/2021 1047   WBC 4.3 01/08/2021 0855   RBC 3.99 (L)  09/17/2021 1047   HGB 12.2 (L) 09/17/2021 1047   HCT 34.3 (L) 09/17/2021 1047   PLT 202 09/17/2021 1047   MCV 86.0 09/17/2021 1047   MCH 30.6 09/17/2021 1047   MCHC 35.6 09/17/2021 1047   RDW 12.8 09/17/2021 1047   LYMPHSABS 1.1 09/17/2021 1047   MONOABS 0.5 09/17/2021 1047   EOSABS 0.1 09/17/2021 1047   BASOSABS 0.0 09/17/2021 1047    CMP Latest Ref Rng & Units 09/17/2021 01/08/2021 07/10/2020  Glucose 70 - 99 mg/dL 104(H) 101(H) 95  BUN 8 - 23 mg/dL 17 19 19   Creatinine 0.61 - 1.24 mg/dL 0.77 0.84 0.81  Sodium 135 - 145 mmol/L 139 141 141  Potassium 3.5 - 5.1 mmol/L 4.0 4.2 4.4  Chloride 98 - 111 mmol/L 107 107 107  CO2 22 - 32 mmol/L 25 23 29   Calcium 8.9 - 10.3 mg/dL 9.0 9.2 9.2  Total Protein 6.5 - 8.1 g/dL 6.9 7.2 6.9  Total Bilirubin 0.3 - 1.2 mg/dL 0.6 0.4 0.3  Alkaline Phos 38 - 126 U/L 70 104 99  AST 15 - 41 U/L 12(L) 19 16  ALT 0 - 44 U/L 9 17 17    . Lab Results  Component Value Date   LDH 123 09/17/2021   Component     Latest Ref Rng & Units 03/24/2018  LDH     98 - 192 U/L 185  HCV Ab     0.0 - 0.9 s/co ratio 0.2  Hep B Core Ab, Tot     Negative Negative  Hepatitis B Surface Ag     Negative Negative  HIV Screen 4th Generation wRfx     Non Reactive Non Reactive    03/11/18 Perineum Bx:    RADIOGRAPHIC STUDIES: I have personally reviewed the radiological  images as listed and agreed with the findings in the report. DG Hand Complete Right  Result Date: 09/15/2021 CLINICAL DATA:  Right middle finger laceration. EXAM: RIGHT HAND - COMPLETE 3+ VIEW COMPARISON:  None. FINDINGS: Mildly displaced and comminuted fracture is seen involving distal tuft of third distal phalanx. No other bony abnormality is noted. Joint spaces are intact. IMPRESSION: Mildly displaced and comminuted distal tuft fracture of third distal phalanx. Electronically Signed   By: Marijo Conception M.D.   On: 09/15/2021 15:00    ASSESSMENT & PLAN:   88 y.o. male with  1. Diffuse Large B-Cell Lymphoma -newly diagnosed presenting as a mass in the left medial gluteal region/perineum s/p resection. PLAN -03/11/18 Surgical bx which revealed Diffuse Large B-Cell Non-Hodgkin's Lymphoma  -04/13/18 ECHO revealed LV EF of 60-65%  -No Hep B, Hep C or HIV from 03/24/18 labs -04/18/18 PET/CT which revealed Mildly hypermetabolic small to borderline enlarged abdominal and pelvic retroperitoneal lymph nodes. Probable mild hypermetabolism within subcentimeter axillary lymph nodes, with misregistration on fused images. 2. Scattered lucent lesions in the iliac wings and right fifth rib. Continued attention on follow-up exams is warranted. 3. Scattered small pulmonary nodules are nonspecific and too small for PET resolution. Continued attention on follow-up exams is warranted. 4. Intermediate density lesion deep to the left gluteal fold, with associated hypermetabolism, which may represent a complex cyst or abscess. 5. Aortic atherosclerosis. Coronary artery calcification. 6. Left renal stone. -Treatment plan include R-CHOP for 6 cycles starting 05/10/18   07/11/18 PET/CT revealed No evidence of recurrent lymphoma. 2. Left renal stone. 3. Aortic atherosclerosis. Coronary artery calcification.   S/p 6 cycles of R-CHOP completed on 08/24/18  09/22/18 PET/CT revealed  No evidence of hypermetabolic active lymphoma. 2.  Aortic atherosclerosis, coronary artery atherosclerosis and emphysema. 3. Left nephrolithiasis. 4. Sinus disease.  2. Memory issues ? MCI vs dementia    PLAN:  -Patient's labs done today were reviewed in details.  CBC is within normal limits, CMP unremarkable. LDH is within normal limits -Patient has no clinical symptoms or signs suggestive of recurrence/progression of his diffuse large B-cell lymphoma. -No indication for additional work-up or treatment of his DLBCL at this time. -Patient during discussions of goals of care clearly notes that if his lymphoma recurred he would not be willing keen to treated with chemotherapy. -At this time there is no evidence of recurrence we will continue surveillance with labs and clinical examination again in 6 months.  2.  Patient Active Problem List   Diagnosis Date Noted   Open fracture of tuft of distal phalanx of finger 09/16/2021   Depression, major, single episode, moderate ([place]) 03/26/2021   Decreased hearing of both ears 03/26/2021   Decreased visual acuity 03/26/2021   Pulmonary emphysema, unspecified emphysema type ([place]) 02/19/2020   Atherosclerosis of aorta ([place]) 02/19/2020   Memory loss 02/19/2020   Former smoker 08/16/2019   Depression, major, single episode, mild ([place]) 08/16/2019   Diffuse large B-cell lymphoma ([place]) 03/30/2018   Degeneration of lumbar intervertebral disc 10/05/2017   History of total hip arthroplasty 10/05/2017   OA (osteoarthritis) of hip 05/31/2013   History of thyroid cancer 04/21/2011   Macular degeneration 01/05/2011   HYPOTHYROIDISM, POSTSURGICAL 11/20/2008   Osteoarthritis 07/01/2007   Uric acid renal calculus 06/20/2007   Essential hypertension 06/20/2007  -continue f/u with PCP for mx of other chronic medical issues.   FOLLOW UP: RTC with Dr Irene Limbo with labs in 6 months   All of the patient's questions were answered with apparent satisfaction. The patient knows to call the clinic with any problems,  questions or concerns.   Sullivan Lone MD [place] [facility] Hematology/Oncology Physician [facility]

## 2021-10-24 ENCOUNTER — Telehealth (HOSPITAL_BASED_OUTPATIENT_CLINIC_OR_DEPARTMENT_OTHER): Payer: Self-pay

## 2021-10-24 NOTE — Telephone Encounter (Signed)
Patients niece called and left a voicemail voicing concern of the patients health and memory She lives in [place] and cannot physical be around for the patient however she does call to check on him and help She states she called the patient and his phone wasn't charged for over a day or so, she had to call [employer] to do a welfare check and upon there arrival he had no concept of what a Pensions consultant was. Patients niece states his memory is worsening and she would like to know if at upcoming appt if there could be conversation about home health assistance Will route to Dr. Tennis Must [place]

## 2021-10-27 ENCOUNTER — Emergency Department (HOSPITAL_BASED_OUTPATIENT_CLINIC_OR_DEPARTMENT_OTHER)
Admission: EM | Admit: 2021-10-27 | Discharge: 2021-10-27 | Disposition: A | Discharge status: Home / Self Care | Payer: Medicare PPO | Attending: Emergency Medicine | Admitting: Emergency Medicine

## 2021-10-27 ENCOUNTER — Other Ambulatory Visit: Payer: Self-pay

## 2021-10-27 ENCOUNTER — Encounter (HOSPITAL_BASED_OUTPATIENT_CLINIC_OR_DEPARTMENT_OTHER): Payer: Self-pay

## 2021-10-27 ENCOUNTER — Emergency Department (HOSPITAL_BASED_OUTPATIENT_CLINIC_OR_DEPARTMENT_OTHER): Payer: Medicare PPO

## 2021-10-27 DIAGNOSIS — R1084 Generalized abdominal pain: Secondary | ICD-10-CM | POA: Diagnosis not present

## 2021-10-27 DIAGNOSIS — N200 Calculus of kidney: Secondary | ICD-10-CM | POA: Insufficient documentation

## 2021-10-27 DIAGNOSIS — I10 Essential (primary) hypertension: Secondary | ICD-10-CM | POA: Diagnosis not present

## 2021-10-27 DIAGNOSIS — Z20822 Contact with and (suspected) exposure to covid-19: Secondary | ICD-10-CM | POA: Insufficient documentation

## 2021-10-27 DIAGNOSIS — I700 Atherosclerosis of aorta: Secondary | ICD-10-CM | POA: Diagnosis not present

## 2021-10-27 DIAGNOSIS — R1033 Periumbilical pain: Secondary | ICD-10-CM | POA: Diagnosis present

## 2021-10-27 DIAGNOSIS — R109 Unspecified abdominal pain: Secondary | ICD-10-CM | POA: Diagnosis not present

## 2021-10-27 LAB — COMPREHENSIVE METABOLIC PANEL
ALT: 10 U/L (ref 0–44)
AST: 12 U/L — ABNORMAL LOW (ref 15–41)
Albumin: 3.8 g/dL (ref 3.5–5.0)
Alkaline Phosphatase: 64 U/L (ref 38–126)
Anion gap: 11 (ref 5–15)
BUN: 20 mg/dL (ref 8–23)
CO2: 24 mmol/L (ref 22–32)
Calcium: 9.2 mg/dL (ref 8.9–10.3)
Chloride: 104 mmol/L (ref 98–111)
Creatinine, Ser: 0.92 mg/dL (ref 0.61–1.24)
GFR, Estimated: >60 mL/min (ref >60)
Glucose, Bld: 137 mg/dL — ABNORMAL HIGH (ref 70–99)
Potassium: 3.7 mmol/L (ref 3.5–5.1)
Sodium: 139 mmol/L (ref 135–145)
Total Bilirubin: 0.5 mg/dL (ref 0.3–1.2)
Total Protein: 6.5 g/dL (ref 6.5–8.1)

## 2021-10-27 LAB — RESP PANEL BY RT-PCR (FLU A&B, COVID) ARPGX2
Influenza A by PCR: NEGATIVE
Influenza B by PCR: NEGATIVE
SARS Coronavirus 2 by RT PCR: NEGATIVE

## 2021-10-27 LAB — CBC WITH DIFFERENTIAL/PLATELET
Abs Immature Granulocytes: 0.01 10*3/uL (ref 0.00–0.07)
Basophils Absolute: 0 10*3/uL (ref 0.0–0.1)
Basophils Relative: 1 %
Eosinophils Absolute: 0 10*3/uL (ref 0.0–0.5)
Eosinophils Relative: 1 %
HCT: 36.4 % — ABNORMAL LOW (ref 39.0–52.0)
Hemoglobin: 12.3 g/dL — ABNORMAL LOW (ref 13.0–17.0)
Immature Granulocytes: 0 %
Lymphocytes Relative: 22 %
Lymphs Abs: 0.9 10*3/uL (ref 0.7–4.0)
MCH: 29.3 pg (ref 26.0–34.0)
MCHC: 33.8 g/dL (ref 30.0–36.0)
MCV: 86.7 fL (ref 80.0–100.0)
Monocytes Absolute: 0.3 10*3/uL (ref 0.1–1.0)
Monocytes Relative: 8 %
Neutro Abs: 2.6 10*3/uL (ref 1.7–7.7)
Neutrophils Relative %: 68 %
Platelets: 212 10*3/uL (ref 150–400)
RBC: 4.2 MIL/uL — ABNORMAL LOW (ref 4.22–5.81)
RDW: 12.9 % (ref 11.5–15.5)
WBC: 3.8 10*3/uL — ABNORMAL LOW (ref 4.0–10.5)
nRBC: 0 % (ref 0.0–0.2)

## 2021-10-27 LAB — LIPASE, BLOOD: Lipase: 20 U/L (ref 11–51)

## 2021-10-27 MED ORDER — IOHEXOL 300 MG/ML  SOLN
85.0000 mL | Freq: Once | INTRAMUSCULAR | Status: AC | PRN
  Administered 2021-10-27: 85 mL via INTRAVENOUS

## 2021-10-27 MED ORDER — FENTANYL CITRATE PF 50 MCG/ML IJ SOSY
50.0000 ug | PREFILLED_SYRINGE | Freq: Once | INTRAMUSCULAR | Status: AC
  Administered 2021-10-27: 50 ug via INTRAVENOUS
  Filled 2021-10-27: qty 1

## 2021-10-27 MED ORDER — SODIUM CHLORIDE 0.9 % IV BOLUS
1000.0000 mL | Freq: Once | INTRAVENOUS | Status: AC
  Administered 2021-10-27: 1000 mL via INTRAVENOUS

## 2021-10-27 NOTE — Discharge Instructions (Signed)
Recommend 1000 mg of Tylenol every 6 hours as needed for pain.  Recommend increase hydration.  Follow-up with your primary care doctor.  Consider using MiraLAX over-the-counter to help make sure you are having nice easy bowel movements.

## 2021-10-27 NOTE — ED Notes (Signed)
Had to call numerous friends to pick pt up.  Neighbor is coming to get pt.  Pt verbalized understanding of all dc instructions

## 2021-10-27 NOTE — ED Triage Notes (Signed)
Onset this am mid abdominal pain.  Denies nausea or vomiting.  Last BM this am or last night.  Describes pain as cramping

## 2021-10-27 NOTE — ED Provider Notes (Signed)
[place] EMERGENCY DEPT Provider Note   CSN: [id number] Arrival date & time: 10/27/21  1101     History  Chief Complaint  Patient presents with   Abdominal Pain    Jay Webster is a 88 y.o. male.  The history is provided by the patient.  Abdominal Pain Pain location:  Periumbilical Pain quality: aching and cramping   Pain radiates to:  Does not radiate Pain severity:  Moderate Onset quality:  Gradual Duration:  1 day Timing:  Constant Progression:  Unchanged Chronicity:  New Context: previous surgery   Context: not sick contacts and not suspicious food intake   Relieved by:  Nothing Worsened by:  Nothing Associated symptoms: no anorexia, no belching, no chest pain, no chills, no constipation, no cough, no diarrhea, no dysuria, no fatigue, no fever, no hematochezia, no melena, no nausea, no shortness of breath, no sore throat and no vomiting   Risk factors: multiple surgeries       Home Medications Prior to Admission medications   Medication Sig Start Date End Date Taking? Authorizing Provider  doxycycline (VIBRAMYCIN) 100 MG capsule Take 1 capsule (100 mg total) by mouth 2 (two) times daily. 09/15/21   Kommor, [place], MD  levothyroxine (SYNTHROID) 125 MCG tablet Take 1 tablet (125 mcg total) by mouth daily. 09/21/21   de [place], Raymond J, MD      Allergies    Penicillins, Shingrix [zoster vac recomb adjuvanted], and Zoloft [sertraline hcl]    Review of Systems   Review of Systems  Constitutional:  Negative for chills, fatigue and fever.  HENT:  Negative for sore throat.   Respiratory:  Negative for cough and shortness of breath.   Cardiovascular:  Negative for chest pain.  Gastrointestinal:  Positive for abdominal pain. Negative for anorexia, constipation, diarrhea, hematochezia, melena, nausea and vomiting.  Genitourinary:  Negative for dysuria.   Physical Exam Updated Vital Signs BP (!) 145/90    Pulse (!) 106    Temp 97.6 F (36.4 C)  (Oral)    Resp (!) 23    Ht 5\' 3"  (1.6 m)    Wt 65.8 kg    SpO2 99%    BMI 25.69 kg/m  Physical Exam Vitals and nursing note reviewed.  Constitutional:      General: He is not in acute distress.    Appearance: He is well-developed. He is not ill-appearing.  HENT:     Head: Normocephalic and atraumatic.  Eyes:     Conjunctiva/sclera: Conjunctivae normal.  Cardiovascular:     Rate and Rhythm: Normal rate and regular rhythm.     Heart sounds: Normal heart sounds. No murmur heard. Pulmonary:     Effort: Pulmonary effort is normal. No respiratory distress.     Breath sounds: Normal breath sounds.  Abdominal:     General: There is no distension.     Palpations: Abdomen is soft.     Tenderness: There is abdominal tenderness in the periumbilical area.  Musculoskeletal:        General: No swelling.     Cervical back: Neck supple.  Skin:    General: Skin is warm and dry.     Capillary Refill: Capillary refill takes less than 2 seconds.  Neurological:     General: No focal deficit present.     Mental Status: He is alert.  Psychiatric:        Mood and Affect: Mood normal.    ED Results / Procedures / Treatments  Labs (all labs ordered are listed, but only abnormal results are displayed) Labs Reviewed  CBC WITH DIFFERENTIAL/PLATELET - Abnormal; Notable for the following components:      Result Value   WBC 3.8 (*)    RBC 4.20 (*)    Hemoglobin 12.3 (*)    HCT 36.4 (*)    All other components within normal limits  COMPREHENSIVE METABOLIC PANEL - Abnormal; Notable for the following components:   Glucose, Bld 137 (*)    AST 12 (*)    All other components within normal limits  RESP PANEL BY RT-PCR (FLU A&B, COVID) [id number]  LIPASE, BLOOD    EKG EKG Interpretation  Date/Time:  Monday October 27 2021 11:22:28 EST Ventricular Rate:  63 PR Interval:  190 QRS Duration: 144 QT Interval:  463 QTC Calculation: 474 R Axis:   83 Text Interpretation: Sinus rhythm Ventricular  premature complex Right bundle branch block Confirmed by Lennice Sites ([id number]) on 10/27/2021 11:29:03 AM  Radiology CT ABDOMEN PELVIS W CONTRAST  Result Date: 10/27/2021 CLINICAL DATA:  Mid abdominal pain, history of appendectomy, kidney stones EXAM: CT ABDOMEN AND PELVIS WITH CONTRAST TECHNIQUE: Multidetector CT imaging of the abdomen and pelvis was performed using the standard protocol following bolus administration of intravenous contrast. RADIATION DOSE REDUCTION: This exam was performed according to the departmental dose-optimization program which includes automated exposure control, adjustment of the mA and/or kV according to patient size and/or use of iterative reconstruction technique. CONTRAST:  43mL OMNIPAQUE IOHEXOL 300 MG/ML  SOLN COMPARISON:  PET-CT 04/18/2018, 09/22/2018 FINDINGS: Lower chest: There is a 4 mm nodule in the right base, unchanged since 2019. There is a 3-4 mm nodule in the right middle lobe, also unchanged. There is mild scarring and bronchiectasis in the left base, unchanged. The imaged heart is unremarkable. Hepatobiliary: There is a 7 mm hypodense lesion abutting the gallbladder fossa, indeterminate but likely a small cyst, unchanged since 2020. There are no other focal lesions. The gallbladder is unremarkable. There is no biliary ductal dilatation. Pancreas: Unremarkable. Spleen: Unremarkable. Adrenals/Urinary Tract: The adrenals are unremarkable. There is a 1.8 cm exophytic lesion arising from the left kidney lower pole which is technically indeterminate by Hounsfield units but was present on the PET-CT from 2019, favoring a benign cyst. Are no other focal lesions. There is symmetric excretion of contrast into the collecting systems on the delayed images. There is a 4 mm nonobstructing left renal stone. No other stones are identified. There is no hydronephrosis or hydroureter. The bladder is unremarkable. Stomach/Bowel: There is a small hiatal hernia. The stomach is otherwise  unremarkable, allowing for underdistention. There is no evidence of bowel obstruction. There is colonic diverticulosis without evidence of acute diverticulitis. The appendix is surgically absent. Vascular/Lymphatic: There is extensive circumferential calcified atherosclerotic plaque throughout the nonaneurysmal abdominal aorta. The major branch vessels are patent. The main portal and splenic veins are patent. There is no pathologic lymphadenopathy in the abdomen or pelvis. Reproductive: The prostate and seminal vesicles are unremarkable. Other: There is no ascites or free air. There is a small fat containing umbilical hernia. Musculoskeletal: Numerous lucent lesions in the bilateral iliac bones are unchanged since 2019, suggesting a benign etiology. There is no acute osseous abnormality or aggressive osseous lesion. Patient is status post left hip arthroplasty. IMPRESSION: 1. No acute findings in the abdomen or pelvis. 2. Colonic diverticulosis without evidence of acute diverticulitis. 3. 4 mm nonobstructing left renal stone. 4. 1.8 cm hypodense lesion arising from the left kidney  is technically indeterminate by Hounsfield units, but was present on the prior PET-CT from 2019, favoring a benign cyst. 5. Small hiatal hernia. 6.  Aortic Atherosclerosis (ICD10-I70.0). Electronically Signed   By: Valetta Mole M.D.   On: 10/27/2021 12:50    Procedures Procedures    Medications Ordered in ED Medications  sodium chloride 0.9 % bolus 1,000 mL (0 mLs Intravenous Stopped 10/27/21 1301)  fentaNYL (SUBLIMAZE) injection 50 mcg (50 mcg Intravenous Given 10/27/21 1139)  iohexol (OMNIPAQUE) 300 MG/ML solution 85 mL (85 mLs Intravenous Contrast Given 10/27/21 1225)    ED Course/ Medical Decision Making/ A&P                           Medical Decision Making Amount and/or Complexity of Data Reviewed Labs: ordered. Radiology: ordered.  Risk Prescription drug management.   [name] is here with abdominal  pain.  Comorbidities include hypertension, history of abdominal surgeries including appendectomy.  Normal vitals.  No fever.  Pain for the last day.  Mostly periumbilical.  He is tender mostly epigastric/periumbilical on exam.  No obvious hernias.  Last bowel movement he thinks was yesterday.  Denies any chest pain or shortness of breath.  Differential diagnosis includes bowel obstruction versus gastritis/reflux versus pancreatitis versus colitis versus UTI.  To evaluate we will get CBC, CMP, lipase, urinalysis, CT scan abdomen and pelvis.  Will give IV fluid bolus, IV fentanyl and reevaluate.  Lab work has been evaluated and interpreted by myself.  No significant anemia, electrolyte abnormality, kidney injury.  COVID test is negative.  Gallbladder and liver enzymes within normal limits.  CT scan per radiology report has no acute findings.  No pancreatitis or cholecystitis.  No bowel obstruction.  Patient has nonobstructing left renal stone.  He has likely benign renal cyst.  Overall no acute findings.  Suspect that this is gas related type pain.  He has follow-up with his primary care doctor tomorrow.  Recommend Tylenol, MiraLAX.  Discharged in good condition.  This chart was dictated using voice recognition software.  Despite best efforts to proofread,  errors can occur which can change the documentation meaning.         Final Clinical Impression(s) / ED Diagnoses Final diagnoses:  Generalized abdominal pain    Rx / DC Orders ED Discharge Orders     None         Lennice Sites, DO 10/27/21 1308

## 2021-10-27 NOTE — ED Notes (Signed)
Urinal placed on siderail, will send urine specimen once pt is able to make one

## 2021-10-28 ENCOUNTER — Ambulatory Visit (INDEPENDENT_AMBULATORY_CARE_PROVIDER_SITE_OTHER): Payer: Medicare PPO | Admitting: Family Medicine

## 2021-10-28 ENCOUNTER — Encounter (HOSPITAL_BASED_OUTPATIENT_CLINIC_OR_DEPARTMENT_OTHER): Payer: Self-pay | Admitting: Family Medicine

## 2021-10-28 ENCOUNTER — Other Ambulatory Visit (HOSPITAL_BASED_OUTPATIENT_CLINIC_OR_DEPARTMENT_OTHER): Payer: Self-pay | Admitting: Family Medicine

## 2021-10-28 VITALS — BP 117/72 | HR 89 | Ht 64.0 in | Wt 169.0 lb

## 2021-10-28 DIAGNOSIS — R413 Other amnesia: Secondary | ICD-10-CM

## 2021-10-28 DIAGNOSIS — R109 Unspecified abdominal pain: Secondary | ICD-10-CM

## 2021-10-28 DIAGNOSIS — E890 Postprocedural hypothyroidism: Secondary | ICD-10-CM

## 2021-10-28 NOTE — Progress Notes (Signed)
° ° °  Procedures performed today:    None.  Independent interpretation of notes and tests performed by another provider:   None.  Brief History, Exam, Impression, and Recommendations:    BP 117/72    Pulse 89    Ht 5\' 4"  (1.626 m)    Wt 169 lb (76.7 kg)    SpO2 98%    BMI 29.01 kg/m   Abdominal pain Patient was seen in the emergency department yesterday.  Work-up at that time was generally unremarkable, it was felt that pain was gas related type pain and at the time of discharge he was doing well.  Today he reports that symptoms are largely improved, denies any current pain.  Denies any recent changes in bowel movements or other issues associated with the abdominal pain.  Exam today with no abdominal tenderness, normal bowel sounds.  No organomegaly. Given that he is asymptomatic at present, likely can continue with monitoring, no further evaluation at this time  HYPOTHYROIDISM, POSTSURGICAL Most recent TSH was below normal limits, patient was advised to decrease dose of levothyroxine, he indicates that he has been taking a lower dose of levothyroxine as recommended. We will check TSH today to assess response to lower dose of levothyroxine  Memory loss Patient has had longstanding issues with memory concerns.  This has also been described by his prior PCP.  We recently did receive a call from his niece as well who indicates that she occasionally calls to check on him and recently she attempted to call the patient and it ended up that his phone had not been charged in the a while.  She did have to call [employer] for a welfare check and reports that on their arrival, patient seemed to be unaware of what a phone charger was.  Her concerns are that his memory difficulties have been worsening and is wondering about further assistance for patient. In discussing with patient today, he admits that he has been having trouble with his memory, however he also indicates that he feels he remains fine to live on  his own. Patient lives locally in single-family home but does have friends/neighbors who will check on him from time to time as well as his niece as above Given concerns related to memory issues, will need to explore options available to patient including further assessment of living situation, further assessment of cognitive function.  We will look into resources available including possible social work assessment, evaluation with neurology or neuropsych for cognitive functioning  Plan for follow-up in about 4 to 6 weeks or sooner as needed   ___________________________________________ Maiko Salais de [place], MD, ABFM, CAQSM [facility]

## 2021-10-28 NOTE — Patient Instructions (Signed)
°  Medication Instructions:  Your physician recommends that you continue on your current medications as directed. Please refer to the Current Medication list given to you today. --If you need a refill on any your medications before your next appointment, please call your pharmacy first. If no refills are authorized on file call the office.-- Lab Work: Your physician has recommended that you have lab work today: TSH If you have labs (blood work) drawn today and your tests are completely normal, you will receive your results via [place] a phone call from our staff.  Please ensure you check your voicemail in the event that you authorized detailed messages to be left on a delegated number. If you have any lab test that is abnormal or we need to change your treatment, we will call you to review the results.   Follow-Up: Your next appointment:   Your physician recommends that you schedule a follow-up appointment in: 6 weeks with Dr. de [place]am  You will receive a text message or e-mail with a link to a survey about your care and experience with [place] today! We would greatly appreciate your feedback!   Thanks for letting us be apart of your health journey!!  Primary Care and Sports Medicine   Dr. Arlina Robes [place]   We encourage you to activate your patient portal called "MyChart".  Sign up information is provided on this After Visit Summary.  MyChart is used to connect with patients for Virtual Visits (Telemedicine).  Patients are able to view lab/test results, encounter notes, upcoming appointments, etc.  Non-urgent messages can be sent to your provider as well. To learn more about what you can do with MyChart, please visit --  [email or web address].

## 2021-10-29 LAB — TSH: TSH: 0.351 u[IU]/mL — ABNORMAL LOW (ref 0.450–4.500)

## 2021-10-30 ENCOUNTER — Other Ambulatory Visit (HOSPITAL_BASED_OUTPATIENT_CLINIC_OR_DEPARTMENT_OTHER): Payer: Self-pay

## 2021-10-30 DIAGNOSIS — R413 Other amnesia: Secondary | ICD-10-CM

## 2021-10-30 DIAGNOSIS — R109 Unspecified abdominal pain: Secondary | ICD-10-CM | POA: Insufficient documentation

## 2021-10-30 NOTE — Assessment & Plan Note (Signed)
Most recent TSH was below normal limits, patient was advised to decrease dose of levothyroxine, he indicates that he has been taking a lower dose of levothyroxine as recommended. We will check TSH today to assess response to lower dose of levothyroxine

## 2021-10-30 NOTE — Assessment & Plan Note (Signed)
Patient was seen in the emergency department yesterday.  Work-up at that time was generally unremarkable, it was felt that pain was gas related type pain and at the time of discharge he was doing well.  Today he reports that symptoms are largely improved, denies any current pain.  Denies any recent changes in bowel movements or other issues associated with the abdominal pain.  Exam today with no abdominal tenderness, normal bowel sounds.  No organomegaly. Given that he is asymptomatic at present, likely can continue with monitoring, no further evaluation at this time

## 2021-10-30 NOTE — Progress Notes (Signed)
Per Dr. de [place]am order placed to Neurology for neurocognitive eval

## 2021-10-30 NOTE — Assessment & Plan Note (Signed)
Patient has had longstanding issues with memory concerns.  This has also been described by his prior PCP.  We recently did receive a call from his niece as well who indicates that she occasionally calls to check on him and recently she attempted to call the patient and it ended up that his phone had not been charged in the a while.  She did have to call [employer] for a welfare check and reports that on their arrival, patient seemed to be unaware of what a phone charger was.  Her concerns are that his memory difficulties have been worsening and is wondering about further assistance for patient. In discussing with patient today, he admits that he has been having trouble with his memory, however he also indicates that he feels he remains fine to live on his own. Patient lives locally in single-family home but does have friends/neighbors who will check on him from time to time as well as his niece as above Given concerns related to memory issues, will need to explore options available to patient including further assessment of living situation, further assessment of cognitive function.  We will look into resources available including possible social work assessment, evaluation with neurology or neuropsych for cognitive functioning

## 2021-12-09 ENCOUNTER — Other Ambulatory Visit (HOSPITAL_BASED_OUTPATIENT_CLINIC_OR_DEPARTMENT_OTHER): Payer: Self-pay | Admitting: Family Medicine

## 2021-12-16 ENCOUNTER — Ambulatory Visit (HOSPITAL_BASED_OUTPATIENT_CLINIC_OR_DEPARTMENT_OTHER): Payer: Medicare PPO | Admitting: Family Medicine

## 2021-12-22 ENCOUNTER — Ambulatory Visit (HOSPITAL_BASED_OUTPATIENT_CLINIC_OR_DEPARTMENT_OTHER): Payer: Medicare PPO | Admitting: Family Medicine

## 2021-12-22 ENCOUNTER — Encounter (HOSPITAL_BASED_OUTPATIENT_CLINIC_OR_DEPARTMENT_OTHER): Payer: Self-pay | Admitting: Family Medicine

## 2021-12-22 VITALS — BP 138/75 | HR 65 | Temp 97.7°F | Ht 64.0 in | Wt 185.9 lb

## 2021-12-22 DIAGNOSIS — R413 Other amnesia: Secondary | ICD-10-CM | POA: Diagnosis not present

## 2021-12-22 DIAGNOSIS — E890 Postprocedural hypothyroidism: Secondary | ICD-10-CM | POA: Diagnosis not present

## 2021-12-22 NOTE — Assessment & Plan Note (Signed)
Most recent TSH was slightly below normal limit.  He indicates that he has been taking his levothyroxine regularly recently.  Given this, would plan to recheck TSH to reassess current status and ensure that it is within the limits or if any dose adjustment is needed.  Patient is amenable to checking labs ?

## 2021-12-22 NOTE — Assessment & Plan Note (Addendum)
Patient has had longstanding issues with memory, was noted by prior PCP.  He also has family members with concern regarding his cognitive decline which has been documented previously.  Patient does seem aware of his impaired cognitive function, however he continues to feel that he okay daily living on his own but that he will eventually need assistance. ?We previously placed referral to neurology for further cognitive evaluation, however it appears that they attempted to reach patient but did not have any success and did leave messages for patient.  Patient has had some issues with keeping track of his phone and ensuring that his phone remains charged. ?We will be attempting to reach back out to neurology regarding proceeding with referral and evaluation, we also provided patient with information today.  He was accompanied by a friend as well who is aware of the plan. ?Plan for follow-up in about 6 weeks to monitor progress or sooner as needed ?

## 2021-12-22 NOTE — Patient Instructions (Addendum)
[facility] ?Phone: [phone] ?Address: [address], [place], [place] [address] ? ?Management of Memory Problems ? ?There are some general things you can do to help manage your memory problems.  Your memory may not in fact recover, but by using techniques and strategies you will be able to manage your memory difficulties better. ? ?1)  Establish a routine. ?Try to establish and then stick to a regular routine.  By doing this, you will get used to what to expect and you will reduce the need to rely on your memory.  Also, try to do things at the same time of day, such as taking your medication or checking your calendar first thing in the morning. ?Think about think that you can do as a part of a regular routine and make a list.  Then enter them into a daily planner to remind you.  This will help you establish a routine. ? ?2)  Organize your environment. ?Organize your environment so that it is uncluttered.  Decrease visual stimulation.  Place everyday items such as keys or cell phone in the same place every day (ie.  Basket next to front door) ?Use post it notes with a brief message to yourself (ie. Turn off light, lock the door) ?Use labels to indicate where things go (ie. Which cupboards are for food, dishes, etc.) ?Keep a notepad and pen by the telephone to take messages ? ?3)  Memory Aids ?A diary or journal/notebook/daily planner ?Making a list (shopping list, chore list, to do list that needs to be done) ?Using an alarm as a reminder (kitchen timer or cell phone alarm) ?Using cell phone to store information (Notes, Calendar, Reminders) ?Calendar/White board placed in a prominent position ?Post-it notes ? ?In order for memory aids to be useful, you need to have good habits.  It's no good remembering to make a note in your journal if you don't remember to look in it.  Try setting aside a certain time of day to look in journal. ? ?4)  Improving mood and managing fatigue. ?There may be other factors  that contribute to memory difficulties.  Factors, such as anxiety, depression and tiredness can affect memory. ?Regular gentle exercise can help improve your mood and give you more energy. ?Simple relaxation techniques may help relieve symptoms of anxiety ?Try to get back to completing activities or hobbies you enjoyed doing in the past. ?Learn to pace yourself through activities to decrease fatigue. ?Find out about some local support groups where you can share experiences with others. ?Try and achieve 7-8 hours of sleep at night. ?

## 2021-12-22 NOTE — Progress Notes (Addendum)
? ? ?  Procedures performed today:   ? ?None. ? ?Independent interpretation of notes and tests performed by another provider:  ? ?None. ? ?Brief History, Exam, Impression, and Recommendations:   ? ?BP 138/75   Pulse 65   Temp 97.7 ?F (36.5 ?C)   Ht '5\' 4"'$  (1.626 m)   Wt 185 lb 14.4 oz (84.3 kg)   SpO2 98%   BMI 31.91 kg/m?  ? ?Patient accompanied to office today by friend ? ?Memory loss ?Patient has had longstanding issues with memory, was noted by prior PCP.  He also has family members with concern regarding his cognitive decline which has been documented previously.  Patient does seem aware of his impaired cognitive function, however he continues to feel that he okay daily living on his own but that he will eventually need assistance. ?We previously placed referral to neurology for further cognitive evaluation, however it appears that they attempted to reach patient but did not have any success and did leave messages for patient.  Patient has had some issues with keeping track of his phone and ensuring that his phone remains charged. ?We will be attempting to reach back out to neurology regarding proceeding with referral and evaluation, we also provided patient with information today.  He was accompanied by a friend as well who is aware of the plan. ?Plan for follow-up in about 6 weeks to monitor progress or sooner as needed ? ?HYPOTHYROIDISM, POSTSURGICAL ?Most recent TSH was slightly below normal limit.  He indicates that he has been taking his levothyroxine regularly recently.  Given this, would plan to recheck TSH to reassess current status and ensure that it is within the limits or if any dose adjustment is needed.  Patient is amenable to checking labs ? ? ?___________________________________________ ?Bryanna Yim de [place], MD, ABFM, CAQSM ?Primary Care and Sports Medicine ?[place] ?

## 2021-12-26 DIAGNOSIS — Z961 Presence of intraocular lens: Secondary | ICD-10-CM | POA: Diagnosis not present

## 2021-12-26 DIAGNOSIS — H524 Presbyopia: Secondary | ICD-10-CM | POA: Diagnosis not present

## 2021-12-26 DIAGNOSIS — H353231 Exudative age-related macular degeneration, bilateral, with active choroidal neovascularization: Secondary | ICD-10-CM | POA: Diagnosis not present

## 2021-12-30 ENCOUNTER — Encounter: Payer: Self-pay | Admitting: Physician Assistant

## 2022-01-01 ENCOUNTER — Other Ambulatory Visit (HOSPITAL_BASED_OUTPATIENT_CLINIC_OR_DEPARTMENT_OTHER): Payer: Self-pay | Admitting: Family Medicine

## 2022-01-05 ENCOUNTER — Encounter (HOSPITAL_COMMUNITY): Payer: Self-pay

## 2022-01-05 ENCOUNTER — Other Ambulatory Visit: Payer: Self-pay

## 2022-01-05 ENCOUNTER — Emergency Department (HOSPITAL_COMMUNITY)
Admission: EM | Admit: 2022-01-05 | Discharge: 2022-01-07 | Disposition: A | Discharge status: Other Acute Inpatient Hospital | Payer: Medicare PPO | Attending: Emergency Medicine | Admitting: Emergency Medicine

## 2022-01-05 DIAGNOSIS — R519 Headache, unspecified: Secondary | ICD-10-CM | POA: Insufficient documentation

## 2022-01-05 DIAGNOSIS — R419 Unspecified symptoms and signs involving cognitive functions and awareness: Secondary | ICD-10-CM | POA: Diagnosis not present

## 2022-01-05 DIAGNOSIS — R45851 Suicidal ideations: Secondary | ICD-10-CM | POA: Diagnosis not present

## 2022-01-05 DIAGNOSIS — Z79890 Hormone replacement therapy (postmenopausal): Secondary | ICD-10-CM | POA: Diagnosis not present

## 2022-01-05 DIAGNOSIS — Z20822 Contact with and (suspected) exposure to covid-19: Secondary | ICD-10-CM | POA: Diagnosis not present

## 2022-01-05 DIAGNOSIS — F32A Depression, unspecified: Secondary | ICD-10-CM

## 2022-01-05 DIAGNOSIS — I4510 Unspecified right bundle-branch block: Secondary | ICD-10-CM | POA: Insufficient documentation

## 2022-01-05 LAB — RAPID URINE DRUG SCREEN, HOSP PERFORMED
Amphetamines: NOT DETECTED
Barbiturates: NOT DETECTED
Benzodiazepines: NOT DETECTED
Cocaine: NOT DETECTED
Opiates: NOT DETECTED
Tetrahydrocannabinol: NOT DETECTED

## 2022-01-05 LAB — ETHANOL: Alcohol, Ethyl (B): <10 mg/dL (ref <10)

## 2022-01-05 LAB — CBC WITH DIFFERENTIAL/PLATELET
Abs Immature Granulocytes: 0.03 10*3/uL (ref 0.00–0.07)
Basophils Absolute: 0 10*3/uL (ref 0.0–0.1)
Basophils Relative: 1 %
Eosinophils Absolute: 0.1 10*3/uL (ref 0.0–0.5)
Eosinophils Relative: 1 %
HCT: 36.3 % — ABNORMAL LOW (ref 39.0–52.0)
Hemoglobin: 11.9 g/dL — ABNORMAL LOW (ref 13.0–17.0)
Immature Granulocytes: 1 %
Lymphocytes Relative: 17 %
Lymphs Abs: 0.9 10*3/uL (ref 0.7–4.0)
MCH: 29.6 pg (ref 26.0–34.0)
MCHC: 32.8 g/dL (ref 30.0–36.0)
MCV: 90.3 fL (ref 80.0–100.0)
Monocytes Absolute: 0.5 10*3/uL (ref 0.1–1.0)
Monocytes Relative: 9 %
Neutro Abs: 4 10*3/uL (ref 1.7–7.7)
Neutrophils Relative %: 71 %
Platelets: 206 10*3/uL (ref 150–400)
RBC: 4.02 MIL/uL — ABNORMAL LOW (ref 4.22–5.81)
RDW: 13.3 % (ref 11.5–15.5)
WBC: 5.6 10*3/uL (ref 4.0–10.5)
nRBC: 0 % (ref 0.0–0.2)

## 2022-01-05 LAB — COMPREHENSIVE METABOLIC PANEL
ALT: 11 U/L (ref 0–44)
AST: 13 U/L — ABNORMAL LOW (ref 15–41)
Albumin: 3.3 g/dL — ABNORMAL LOW (ref 3.5–5.0)
Alkaline Phosphatase: 83 U/L (ref 38–126)
Anion gap: 7 (ref 5–15)
BUN: 16 mg/dL (ref 8–23)
CO2: 25 mmol/L (ref 22–32)
Calcium: 8.7 mg/dL — ABNORMAL LOW (ref 8.9–10.3)
Chloride: 107 mmol/L (ref 98–111)
Creatinine, Ser: 0.76 mg/dL (ref 0.61–1.24)
GFR, Estimated: >60 mL/min (ref >60)
Glucose, Bld: 100 mg/dL — ABNORMAL HIGH (ref 70–99)
Potassium: 4 mmol/L (ref 3.5–5.1)
Sodium: 139 mmol/L (ref 135–145)
Total Bilirubin: 0.5 mg/dL (ref 0.3–1.2)
Total Protein: 6.5 g/dL (ref 6.5–8.1)

## 2022-01-05 LAB — URINALYSIS, ROUTINE W REFLEX MICROSCOPIC
Bilirubin Urine: NEGATIVE
Glucose, UA: NEGATIVE mg/dL
Hgb urine dipstick: NEGATIVE
Ketones, ur: NEGATIVE mg/dL
Leukocytes,Ua: NEGATIVE
Nitrite: NEGATIVE
Protein, ur: NEGATIVE mg/dL
Specific Gravity, Urine: 1.016 (ref 1.005–1.030)
pH: 5 (ref 5.0–8.0)

## 2022-01-05 LAB — RESP PANEL BY RT-PCR (FLU A&B, COVID) ARPGX2
Influenza A by PCR: NEGATIVE
Influenza B by PCR: NEGATIVE
SARS Coronavirus 2 by RT PCR: NEGATIVE

## 2022-01-05 MED ORDER — STERILE WATER FOR INJECTION IJ SOLN
INTRAMUSCULAR | Status: AC
  Filled 2022-01-05: qty 10

## 2022-01-05 MED ORDER — LEVOTHYROXINE SODIUM 25 MCG PO TABS
125.0000 ug | ORAL_TABLET | Freq: Every day | ORAL | Status: DC
  Administered 2022-01-05: 125 ug via ORAL
  Filled 2022-01-05: qty 1

## 2022-01-05 MED ORDER — ZIPRASIDONE MESYLATE 20 MG IM SOLR
10.0000 mg | Freq: Once | INTRAMUSCULAR | Status: AC
  Administered 2022-01-05: 10 mg via INTRAMUSCULAR
  Filled 2022-01-05: qty 20

## 2022-01-05 MED ORDER — ACETAMINOPHEN 325 MG PO TABS: 650.0000 mg | ORAL_TABLET | Freq: Four times a day (QID) | ORAL | Status: DC | PRN

## 2022-01-05 NOTE — ED Provider Notes (Addendum)
?[facility] ?Provider Note ? ? ?CSN: [id number] ?Arrival date & time: 01/05/22  1421 ? ?  ? ?History ? ?Chief Complaint  ?Patient presents with  ? Psychiatric Evaluation  ? IVC  ? ? ?Jay Webster is a 88 y.o. male. Presenting to ER under IVC.  Per report, patient was hitting himself and crying uncontrollably and made statement that he wanted to end it all.  He currently states that he does not have any ongoing thoughts of suicide.  States he did not intend to commit any acts of self-harm or suicide.  Has no thoughts of hurting others.  No homicidal ideation.  Reports that he has a slight headache.  Does not want any medicine for this.  Takes Synthroid but denies any other chronic medications. ? ?HPI ? ?  ? ?Home Medications ?Prior to Admission medications   ?Medication Sig Start Date End Date Taking? Authorizing Provider  ?doxycycline (VIBRAMYCIN) 100 MG capsule Take 1 capsule (100 mg total) by mouth 2 (two) times daily. 09/15/21   Kommor, Madison, MD  ?levothyroxine (SYNTHROID) 125 MCG tablet TAKE 1 TABLET BY MOUTH EVERY DAY 01/01/22   de [place], Blondell Reveal, MD  ?   ? ?Allergies    ?Penicillins, Shingrix [zoster vac recomb adjuvanted], and Zoloft [sertraline hcl]   ? ?Review of Systems   ?Review of Systems  ?Constitutional:  Negative for chills and fever.  ?HENT:  Negative for ear pain and sore throat.   ?Eyes:  Negative for pain and visual disturbance.  ?Respiratory:  Negative for cough and shortness of breath.   ?Cardiovascular:  Negative for chest pain and palpitations.  ?Gastrointestinal:  Negative for abdominal pain and vomiting.  ?Genitourinary:  Negative for dysuria and hematuria.  ?Musculoskeletal:  Negative for arthralgias and back pain.  ?Skin:  Negative for color change and rash.  ?Neurological:  Negative for seizures and syncope.  ?All other systems reviewed and are negative. ? ?Physical Exam ?Updated Vital Signs ?BP (!) 148/78 (BP Location: Left Arm)   Pulse 73    Temp 97.9 ?F (36.6 ?C)   Resp 17   SpO2 100%  ?Physical Exam ?Vitals and nursing note reviewed.  ?Constitutional:   ?   General: He is not in acute distress. ?   Appearance: Normal appearance. He is well-developed. He is not ill-appearing.  ?HENT:  ?   Head: Normocephalic and atraumatic.  ?Eyes:  ?   Conjunctiva/sclera: Conjunctivae normal.  ?Cardiovascular:  ?   Rate and Rhythm: Normal rate and regular rhythm.  ?   Heart sounds: No murmur heard. ?Pulmonary:  ?   Effort: Pulmonary effort is normal. No respiratory distress.  ?   Breath sounds: Normal breath sounds.  ?Abdominal:  ?   Palpations: Abdomen is soft.  ?   Tenderness: There is no abdominal tenderness.  ?Musculoskeletal:     ?   General: No swelling or deformity.  ?   Cervical back: Neck supple.  ?Skin: ?   General: Skin is warm and dry.  ?   Capillary Refill: Capillary refill takes less than 2 seconds.  ?Neurological:  ?   Mental Status: He is alert.  ?Psychiatric:     ?   Mood and Affect: Mood normal.  ? ? ?ED Results / Procedures / Treatments   ?Labs ?(all labs ordered are listed, but only abnormal results are displayed) ?Labs Reviewed  ?COMPREHENSIVE METABOLIC PANEL - Abnormal; Notable for the following components:  ?  Result Value  ? Glucose, Bld 100 (*)   ? Calcium 8.7 (*)   ? Albumin 3.3 (*)   ? AST 13 (*)   ? All other components within normal limits  ?CBC WITH DIFFERENTIAL/PLATELET - Abnormal; Notable for the following components:  ? RBC 4.02 (*)   ? Hemoglobin 11.9 (*)   ? HCT 36.3 (*)   ? All other components within normal limits  ?RESP PANEL BY RT-PCR (FLU A&B, COVID) ARPGX2  ?ETHANOL  ?RAPID URINE DRUG SCREEN, HOSP PERFORMED  ?URINALYSIS, ROUTINE W REFLEX MICROSCOPIC  ? ? ?EKG ?None ? ?Radiology ?No results found. ? ?Procedures ?Procedures  ? ? ?Medications Ordered in ED ?Medications  ?levothyroxine (SYNTHROID) tablet 125 mcg (125 mcg Oral Given 01/05/22 1723)  ?ziprasidone (GEODON) injection 10 mg (has no administration in time range)   ? ? ?ED Course/ Medical Decision Making/ A&P ?  ?                        ?Medical Decision Making ?Amount and/or Complexity of Data Reviewed ?Labs: ordered. ? ?Risk ?Prescription drug management. ? ? ?88 year old gentleman presenting to the emergency department due to concern for IVC.  Had made suicidal statements earlier today.  Currently patient is calm and cooperative, he denies ongoing suicidal ideation.  His basic labs were reviewed, no anemia, no electrolyte derangement, no leukocytosis.  Will consult TTS.  Patient is medically cleared for psychiatric evaluation. ? ?Patient had outburst, yelling at staff, very agitated and aggressive.  Will give dose of Geodon.  Will place psych hold orders, will complete first exam paperwork. ? ? ? ? ? ?Final Clinical Impression(s) / ED Diagnoses ?Final diagnoses:  ?Suicidal ideation  ?Depression, unspecified depression type  ? ? ?Rx / DC Orders ?ED Discharge Orders   ? ? None  ? ?  ? ? ?  ?Lucrezia Starch, MD ?01/05/22 1843 ? ?  ?Lucrezia Starch, MD ?01/05/22 2156 ? ?

## 2022-01-05 NOTE — ED Notes (Signed)
Patient agitated, standing at bedside yelling. Pt wanting hallway lights turned off. Pt informed that we cannot do this. Pt began to bite hand then attempted to walk away and was redirected by secutiry and became to yell "Im going to blow my god damn brains out" ?

## 2022-01-05 NOTE — ED Notes (Signed)
Pt wanded by security. 

## 2022-01-05 NOTE — ED Notes (Signed)
Pt changed into purple scrubs, belongings placed in locker #1. ?

## 2022-01-05 NOTE — ED Notes (Signed)
Pt ambulated to the restroom using walker. Pt provided new cloths and bedding was changed. Pt provided meal back and drink.  ?

## 2022-01-05 NOTE — ED Triage Notes (Signed)
Pt arrives with PD under IVC by APS social worker, per IVC paperwork during her visit he began hitting himself and crying uncontrollably, reported to her that he wanted to "end it all". ?Pt irritable in triage, denies SI but reports HI.  ?

## 2022-01-06 ENCOUNTER — Ambulatory Visit (HOSPITAL_BASED_OUTPATIENT_CLINIC_OR_DEPARTMENT_OTHER): Payer: Medicare PPO

## 2022-01-06 DIAGNOSIS — Z0000 Encounter for general adult medical examination without abnormal findings: Secondary | ICD-10-CM

## 2022-01-06 DIAGNOSIS — R9431 Abnormal electrocardiogram [ECG] [EKG]: Secondary | ICD-10-CM | POA: Diagnosis not present

## 2022-01-06 DIAGNOSIS — R45851 Suicidal ideations: Secondary | ICD-10-CM | POA: Diagnosis not present

## 2022-01-06 DIAGNOSIS — F32A Depression, unspecified: Secondary | ICD-10-CM | POA: Diagnosis not present

## 2022-01-06 NOTE — ED Notes (Signed)
Placed breakfast order ?

## 2022-01-06 NOTE — ED Notes (Signed)
Information provided by Jay Webster ,sitter with PT. Pt reported to [facility] "it is a good day to die" like to go to sleep and wake up dead as I call It. ?

## 2022-01-06 NOTE — ED Notes (Signed)
PT refusing vitals ?

## 2022-01-06 NOTE — ED Notes (Signed)
pt stated he is "ready to go" because he is the last one left and is all alone. the only joy he gets is from the neighbors and their young child. pt stated "I am ready for it to end, but I will not kill myself. I might stop taking all medication. I will wait until one blessed morning I just dont wake up." ? ?

## 2022-01-06 NOTE — BH Assessment (Signed)
Pt received medication earlier. Per RN, he is currently somnolent and unable to participate in tele-assessment. [facility] staff will contact TTS when Pt is ready. ? ? ?Orpah Greek Anson Fret, [facility], [place] ?Triage Specialist ?[phone] ? ?

## 2022-01-06 NOTE — ED Notes (Signed)
TC to Assessment team  to inform Pt is now awake. TTS attempted  to eval Pt but Pt was sleeping . ?

## 2022-01-07 ENCOUNTER — Inpatient Hospital Stay
Admission: AD | Admit: 2022-01-07 | Discharge: 2022-01-30 | DRG: 885 | Disposition: A | Discharge status: Nursing Home (Includes ICF) | Payer: Medicare PPO | Source: Intra-hospital | Attending: Psychiatry | Admitting: Psychiatry

## 2022-01-07 ENCOUNTER — Encounter: Payer: Self-pay | Admitting: Psychiatry

## 2022-01-07 ENCOUNTER — Other Ambulatory Visit: Payer: Self-pay

## 2022-01-07 DIAGNOSIS — Z79890 Hormone replacement therapy (postmenopausal): Secondary | ICD-10-CM | POA: Diagnosis not present

## 2022-01-07 DIAGNOSIS — R45851 Suicidal ideations: Secondary | ICD-10-CM | POA: Diagnosis not present

## 2022-01-07 DIAGNOSIS — Z20822 Contact with and (suspected) exposure to covid-19: Secondary | ICD-10-CM | POA: Diagnosis present

## 2022-01-07 DIAGNOSIS — R4189 Other symptoms and signs involving cognitive functions and awareness: Secondary | ICD-10-CM | POA: Diagnosis not present

## 2022-01-07 DIAGNOSIS — Z87891 Personal history of nicotine dependence: Secondary | ICD-10-CM | POA: Diagnosis not present

## 2022-01-07 DIAGNOSIS — Z8572 Personal history of non-Hodgkin lymphomas: Secondary | ICD-10-CM | POA: Diagnosis not present

## 2022-01-07 DIAGNOSIS — Z833 Family history of diabetes mellitus: Secondary | ICD-10-CM | POA: Diagnosis not present

## 2022-01-07 DIAGNOSIS — Z96642 Presence of left artificial hip joint: Secondary | ICD-10-CM | POA: Diagnosis present

## 2022-01-07 DIAGNOSIS — F332 Major depressive disorder, recurrent severe without psychotic features: Secondary | ICD-10-CM | POA: Diagnosis not present

## 2022-01-07 DIAGNOSIS — F0284 Dementia in other diseases classified elsewhere, unspecified severity, with anxiety: Secondary | ICD-10-CM | POA: Diagnosis present

## 2022-01-07 DIAGNOSIS — F02818 Dementia in other diseases classified elsewhere, unspecified severity, with other behavioral disturbance: Secondary | ICD-10-CM | POA: Diagnosis present

## 2022-01-07 DIAGNOSIS — Z809 Family history of malignant neoplasm, unspecified: Secondary | ICD-10-CM

## 2022-01-07 DIAGNOSIS — Z85850 Personal history of malignant neoplasm of thyroid: Secondary | ICD-10-CM

## 2022-01-07 DIAGNOSIS — I10 Essential (primary) hypertension: Secondary | ICD-10-CM | POA: Diagnosis not present

## 2022-01-07 DIAGNOSIS — Z8249 Family history of ischemic heart disease and other diseases of the circulatory system: Secondary | ICD-10-CM | POA: Diagnosis not present

## 2022-01-07 DIAGNOSIS — F03918 Unspecified dementia, unspecified severity, with other behavioral disturbance: Secondary | ICD-10-CM

## 2022-01-07 DIAGNOSIS — G4700 Insomnia, unspecified: Secondary | ICD-10-CM | POA: Diagnosis present

## 2022-01-07 DIAGNOSIS — F0283 Dementia in other diseases classified elsewhere, unspecified severity, with mood disturbance: Secondary | ICD-10-CM | POA: Diagnosis present

## 2022-01-07 DIAGNOSIS — R419 Unspecified symptoms and signs involving cognitive functions and awareness: Secondary | ICD-10-CM | POA: Diagnosis not present

## 2022-01-07 DIAGNOSIS — F32A Depression, unspecified: Secondary | ICD-10-CM | POA: Diagnosis not present

## 2022-01-07 DIAGNOSIS — R519 Headache, unspecified: Secondary | ICD-10-CM | POA: Diagnosis not present

## 2022-01-07 DIAGNOSIS — R4689 Other symptoms and signs involving appearance and behavior: Secondary | ICD-10-CM | POA: Diagnosis not present

## 2022-01-07 DIAGNOSIS — E890 Postprocedural hypothyroidism: Secondary | ICD-10-CM | POA: Diagnosis present

## 2022-01-07 MED ORDER — MIRTAZAPINE 15 MG PO TABS
15.0000 mg | ORAL_TABLET | Freq: Every day | ORAL | Status: DC
Start: 2022-01-07 — End: 2022-01-10
  Administered 2022-01-07 – 2022-01-09 (×3): 15 mg via ORAL
  Filled 2022-01-07 (×3): qty 1

## 2022-01-07 MED ORDER — LORAZEPAM 0.5 MG PO TABS
0.5000 mg | ORAL_TABLET | ORAL | Status: DC | PRN
  Administered 2022-01-26 – 2022-01-29 (×2): 0.5 mg via ORAL
  Filled 2022-01-07 (×3): qty 1

## 2022-01-07 MED ORDER — LEVOTHYROXINE SODIUM 125 MCG PO TABS
125.0000 ug | ORAL_TABLET | Freq: Every day | ORAL | Status: DC
  Administered 2022-01-09 – 2022-01-30 (×20): 125 ug via ORAL
  Filled 2022-01-07 (×23): qty 1

## 2022-01-07 MED ORDER — TRAZODONE HCL 100 MG PO TABS
100.0000 mg | ORAL_TABLET | Freq: Every evening | ORAL | Status: DC | PRN
Start: 2022-01-07 — End: 2022-01-30
  Administered 2022-01-10 – 2022-01-29 (×11): 100 mg via ORAL
  Filled 2022-01-07 (×12): qty 1

## 2022-01-07 MED ORDER — MAGNESIUM HYDROXIDE 400 MG/5ML PO SUSP: 30.0000 mL | Freq: Every day | ORAL | Status: DC | PRN

## 2022-01-07 MED ORDER — OLANZAPINE 5 MG PO TABS
5.0000 mg | ORAL_TABLET | Freq: Four times a day (QID) | ORAL | Status: DC | PRN
Start: 2022-01-07 — End: 2022-01-30

## 2022-01-07 MED ORDER — ALUM & MAG HYDROXIDE-SIMETH 200-200-20 MG/5ML PO SUSP: 30.0000 mL | ORAL | Status: DC | PRN

## 2022-01-07 NOTE — ED Notes (Signed)
TTS in process 

## 2022-01-07 NOTE — Progress Notes (Signed)
Patient has been faxed out per the request of Dr. Dwyane Dee. Patient meets [place] inpatient criteria per Lindon Romp, NP. Patient has been faxed out to the following facilities:  ? ? ?[facility]  [address], [place] [address] [phone]  ?[place], [place] [address] [phone]  ?[facility]  [address] [place] [address] [phone]  ?[place]  [place], [place] [address] [phone]  ?[facility]  [address] [place] [address] [phone]  ?[facility]  [address] [place], [place] [address] [phone]  ?[facility]  [place], [place] [address] [phone]  ?[facility]  [address] [place] [address] [phone]  ?[facility]  [address] [place] [address] [phone]  ?[place]  [address] [place] [address] [phone]  ?[facility]  [address] [place] [address] [phone]  ?[facility]  [address] [place] [address] [phone]  ?[facility]  [address], [place] [address] [phone]  ?[place], [place] [address] [phone]  ?[facility]  [place], [place] [address] [phone]  ?[facility]  [address] [place] [address] [phone]  ?[place] [address], [place] [address] [phone]  ? ?Mariea Clonts, MSW, LCSW-A  ?11:19 AM 01/07/2022   ?

## 2022-01-07 NOTE — Progress Notes (Addendum)
Pt is an 88 y/o male admitted to [facility] under IVC status from [facility]. Presents with fair eye contact, A & O to self, place and year. Denies HI, AVH and pain, endorsed SI but verbally contracts for safety when assessed. 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 Per chart review pt was petitioned by APS CSW on a visit because he was was hitting himself, crying inconsolably "wanting to end it all" with plan to walk in front of a car. Also expressed wanting to shoot people who exploited money from him. Pt does have a diagnosis of Dementia. However, pt does appears confused, unable to state reasons for this admission. Per pt "I really don't know why am here. I tend to run off at the mouth and get myself in trouble. That's what got me here" when asked of events leading to admission. Rates his anxiety and depression both 3/10 with current stressor being "needing help at home now that I'm older. My niece is in [place]) that the only family member I got now that checks on me". Reports his neighbors are very supportive of him "they help me get my medicines from the pharmacy, call my niece for me". Ambulatory with unsteady gait. Walker given. Pt's skin is dry and flaky. Scab noted on right arm, old surgical scar to right posterior to lateral back and ecchymosis to left arm. Unit orientation done, routines discussed, care plan reviewed with pt and all admission documents signed. Emotional support and reassurance provided to pt. Safety checks initiated at Q 15 minutes intervals and fall precaution without incident. Pt encouraged to voice concerns. Dinner tray and fluids given, tolerated well. Pt safe in milieu without physical discomfort.Marland Kitchen ?

## 2022-01-07 NOTE — ED Provider Notes (Addendum)
Emergency Medicine Observation Re-evaluation Note ? ?Jay Webster is a 88 y.o. male, seen on rounds today.  Pt initially presented to the ED for complaints of Psychiatric Evaluation and IVC ?Currently, the patient is asleep. ? ?Physical Exam  ?BP 137/72 (BP Location: Right Arm)   Pulse 67   Temp 98.6 ?F (37 ?C) (Oral)   Resp 16   SpO2 99%  ?Physical Exam ?General: resting comfortably ?Cardiac: rrr ?Lungs: cta b ?Psych: asleep ? ?ED Course / MDM  ?EKG:EKG Interpretation ? ?Date/Time:  Monday Jan 05 2022 18:39:37 EDT ?Ventricular Rate:  71 ?PR Interval:  202 ?QRS Duration: 132 ?QT Interval:  434 ?QTC Calculation: 471 ?R Axis:   46 ?Text Interpretation: Normal sinus rhythm Right bundle branch block Abnormal ECG When compared with ECG of 27-Oct-2021 11:22, Premature ventricular complexes are no longer present Confirmed by Delora Fuel ([id number]) on 01/06/2022 3:09:37 AM ? ?I have reviewed the labs performed to date as well as medications administered while in observation.  Recent changes in the last 24 hours include none. ? ?Plan  ?Current plan is for awaiting placement. ?[name] is under involuntary commitment. ? ?Pt has been accepted to [facility].  He remains stable for transfer. ?  ? ?  ?Isla Pence, MD ?01/07/22 [phone] ? ?  ?Isla Pence, MD ?01/07/22 1455 ? ?

## 2022-01-07 NOTE — BH Assessment (Signed)
Comprehensive Clinical Assessment (CCA) Note ? ?01/07/2022 ?Jay Webster ?[id number] ? ?Disposition: Lindon Romp, PMHNP recommends inpatient treatment, disposition CSW to seek placement. Disposition discussed with Beckie Busing, RN.  ? ?[place] ED from 01/05/2022 in [facility] from 10/27/2021 in [facility] ED from 09/15/2021 in [facility]  ?C-SSRS RISK CATEGORY No Risk No Risk No Risk  ? ?  ? ?The patient demonstrates the following risk factors for suicide: Chronic risk factors for suicide include: Major Neurocognitive Disorder with other behavior disturbance. Acute risk factors for suicide include:  Pt reports, to APS worker and RN he wanted to end it all . Protective factors for this patient include:  NA . Considering these factors, the overall suicide risk at this point appears to be . Patient is appropriate for outpatient follow up. ? ?Jay Webster is a 88 year old male who presents involuntary and unaccompanied to [facility]. Clinician asked the pt, "what brought you to the hospital?" Pt reports, "I've been a bad boy, I don't follow directions well, I've gotten myself into trouble." Clinician attempted to ask to provide more details. Pt replied, "I just don't follow directions well, I get into trouble, I try to behave." Per pt, "I behave only a little bit then get mad, I would like to be able to have a normal life but that might not be possible." Pt reports, he's the last of the Mohicans, he's the only one left. Pt denies, SI, HI, AVH, self-injurious behaviors and access to weapons.  ? ?Pt was IVC'd by Oneal Grout, APS Social Worker, [phone]. Per IVC paperwork: "Respondent today began hitting himself and crying inconsolably in front of APS Social Worker who was called out to his residence today. Stated to responding social worker that he wanted to end it call. Then stated he was going to walk in front  of a car and end it all. Stated the would shoot the people who exploited money from him earlier. Has medical appointment this week to access possible diagnosis of Dementia. Respondent has possible access to firearms. Respondent is a danger to himself or others."  ? ?Pt denies, substance use. Pt's UDS is negative. Pt denies, being linked to OPT resources (medication management and/or counseling.)  ? ?Pt was a poor historian during the assessment. Pt presents quiet, awake in scrubs with slow speech. During the assessment clinician had to repeat questions many times. Pt's mood was pleasant. Pt's affect was congruent. Pt's insight was lacking. Pt's judgement was poor. Pt reports, he lives a home alone and can contract for safety if discharged.  ? ?Diagnosis: Major Neurocognitive Disorder with other behavior disturbance. ? ?*Clinician attempted to contact IVC petitioner to gather additional information. Clinician received the following message: "Your call has been forwarded to an automatic voice message system.? The IVC petitioner voicemail box was unidentifiable, clinician did not leave a voice message.*  ? ?Chief Complaint:  ?Chief Complaint  ?Patient presents with  ? Psychiatric Evaluation  ? IVC  ? ?Visit Diagnosis:   ? ? ?CCA Screening, Triage and Referral (STR) ? ?Patient Reported Information ?How did you hear about [place]? Other (Comment) (APS social worker.) ? ?What Is the Reason for Your Visit/Call Today? Per EDP note: "is a 88 y.o. male. Presenting to ER under IVC. Per report, patient was hitting himself and crying uncontrollably and made statement that he wanted to end it all. He currently states that he does not have  any ongoing thoughts of suicide. States he did not intend to commit any acts of self-harm or suicide. Has no thoughts of hurting others. No homicidal ideation. Reports that he has a slight headache. Does not want any medicine for this. Takes Synthroid but denies any other chronic medications." ? ?How  Long Has This Been Causing You Problems? No data recorded ?What Do You Feel Would Help You the Most Today? Social Support; Medication(s) ? ? ?Have You Recently Had Any Thoughts About Hurting Yourself? Yes ? ?Are You Planning to Commit Suicide/Harm Yourself At This time? Yes (Per IVC: "Then stated he was going to walk infront of a car and end it all.") ? ? ?Have you Recently Had Thoughts About [place]? -- (Per IVC: "Stated would shoot the pople who exploited money from him earlier.") ? ?Are You Planning to Harm Someone at This Time? No ? ?Explanation: No data recorded ? ?Have You Used Any Alcohol or Drugs in the Past 24 Hours? No (Pt denies.) ? ?How Long Ago Did You Use Drugs or Alcohol? No data recorded ?What Did You Use and How Much? No data recorded ? ?Do You Currently Have a Therapist/Psychiatrist? No ? ?Name of Therapist/Psychiatrist: No data recorded ? ?Have You Been Recently Discharged From Any Office Practice or Programs? No data recorded ?Explanation of Discharge From Practice/Program: No data recorded ? ?  ?CCA Screening Triage Referral Assessment ?Type of Contact: Tele-Assessment ? ?Telemedicine Service Delivery: Telemedicine service delivery: This service was provided via telemedicine using a 2-way, interactive audio and video technology ? ?Is this Initial or Reassessment? Initial Assessment ? ?Date Telepsych consult ordered in [facility]:  01/05/22 ? ?Time Telepsych consult ordered in [facility]:  1711 ? ?Location of Assessment: [facility] ED ? ?Provider Location: [facility] Assessment Services ? ? ?Collateral Involvement: Oneal Grout, APS Social Worker, [phone]. ? ? ?Does Patient Have a Stage manager Guardian? No data recorded ?Name and Contact of Legal Guardian: No data recorded ?If Minor and Not Living with Parent(s), Who has Custody? No data recorded ?Is CPS involved or ever been involved? No data recorded ?Is APS involved or ever been involved? Currently ? ? ?Patient Determined To Be At Risk for  Harm To Self or Others Based on Review of Patient Reported Information or Presenting Complaint? Yes, for Self-Harm ? ?Method: No data recorded ?Availability of Means: No data recorded ?Intent: No data recorded ?Notification Required: No data recorded ?Additional Information for Danger to Others Potential: No data recorded ?Additional Comments for Danger to Others Potential: No data recorded ?Are There Guns or Other Weapons in [place]? No data recorded ?Types of Guns/Weapons: No data recorded ?Are These Weapons Safely Secured?                            No data recorded ?Who Could Verify You Are Able To Have These Secured: No data recorded ?Do You Have any Outstanding Charges, Pending Court Dates, Parole/Probation? No data recorded ?Contacted To Inform of Risk of Harm To Self or Others: No data recorded ? ? ?Does Patient Present under Involuntary Commitment? Yes ? ?IVC Papers Initial File Date: 01/05/22 ? ? ?[place] of Residence: Kathleen Argue ? ? ?Patient Currently Receiving the Following Services: Not Receiving Services ? ? ?Determination of Need: Emergent (2 hours) ? ? ?Options For Referral: Inpatient Hospitalization; Medication Management; Outpatient Therapy ? ? ? ? ?CCA Biopsychosocial ?Patient Reported Schizophrenia/Schizoaffective Diagnosis in Past: No data recorded ? ?Strengths: No  data recorded ? ?Mental Health Symptoms ?Depression:   ?Fatigue; Difficulty Concentrating (Sadness.) ?  ?Duration of Depressive symptoms:    ?Mania:   ?None ?  ?Anxiety:    ?Difficulty concentrating ?  ?Psychosis:   ?None (Pt denies.) ?  ?Duration of Psychotic symptoms:    ?Trauma:   ?None (Pt denies.) ?  ?Obsessions:   ?-- (UTA) ?  ?Compulsions:   ?-- (UTA) ?  ?Inattention:   ?-- (UTA) ?  ?Hyperactivity/Impulsivity:   ?None (Pt denies.) ?  ?Oppositional/Defiant Behaviors:   ?None (Pt denies.) ?  ?Emotional Irregularity:   ?-- (UTA) ?  ?Other Mood/Personality Symptoms:  No data recorded  ? ?Mental Status Exam ?Appearance and self-care   ?Stature:   ?Average ?  ?Weight:   ?Average weight ?  ?Clothing:   ?-- (Pt in scrubs.) ?  ?Grooming:   ?Normal ?  ?Cosmetic use:   ?None ?  ?Posture/gait:  No data recorded  ?Motor activity:   ?Slo

## 2022-01-07 NOTE — Tx Team (Signed)
Initial Treatment Plan ?01/07/2022 ?7:26 PM ?Jay Webster ?[id number] ? ? ? ?PATIENT STRESSORS: ?Health problems   ?Medication change or noncompliance   ? ? ?PATIENT STRENGTHS: ?Financial means  ?Special hobby/interest  ?Supportive family/friends  ? ? ?PATIENT IDENTIFIED PROBLEMS: ?Alterations in mood (Anxiety & depression ) "I'm all myself now. I only have my neighbors to help me. I get sad sometimes".  ?  ?Medication noncompliance "I take my medications good sometimes".  ?  ?Poor self care   ?  ?Fall risk   ?  ?  ?  ? ?DISCHARGE CRITERIA:  ?Improved stabilization in mood, thinking, and/or behavior ?Verbal commitment to aftercare and medication compliance ? ?PRELIMINARY DISCHARGE PLAN: ?Outpatient therapy ?Return to previous living arrangement ? ?PATIENT/FAMILY INVOLVEMENT: ?This treatment plan has been presented to and reviewed with the patient, Jay Webster.The patient have been given the opportunity to ask questions and make suggestions. ? ?Keane Police, RN ?01/07/2022, 7:26 PM ?

## 2022-01-07 NOTE — ED Notes (Signed)
Breakfast order placed ?

## 2022-01-07 NOTE — BH Assessment (Signed)
Clinician Severiano Gilbert, RN: "Hey. It's Trey with TTS. Is the pt able to engage in the assessment, if so the pt will need to be placed in a private room. Also is the pt under IVC?"  ? ?Clinician awaiting response.  ? ? ?Vertell Novak, MS, [facility], CRC ?Triage Specialist ?[phone] ? ?

## 2022-01-07 NOTE — ED Notes (Signed)
IVC paper work sent [facility] ?

## 2022-01-07 NOTE — ED Notes (Signed)
PT just left with sheriff transport. ?

## 2022-01-08 ENCOUNTER — Encounter: Payer: Self-pay | Admitting: Physician Assistant

## 2022-01-08 ENCOUNTER — Ambulatory Visit: Payer: Medicare PPO | Admitting: Physician Assistant

## 2022-01-08 DIAGNOSIS — R4189 Other symptoms and signs involving cognitive functions and awareness: Secondary | ICD-10-CM | POA: Diagnosis not present

## 2022-01-08 DIAGNOSIS — F332 Major depressive disorder, recurrent severe without psychotic features: Principal | ICD-10-CM | POA: Diagnosis present

## 2022-01-08 DIAGNOSIS — R4689 Other symptoms and signs involving appearance and behavior: Secondary | ICD-10-CM

## 2022-01-08 DIAGNOSIS — Z029 Encounter for administrative examinations, unspecified: Secondary | ICD-10-CM

## 2022-01-08 NOTE — BHH Suicide Risk Assessment (Signed)
BHH INPATIENT:  Family/Significant Other Suicide Prevention Education ? ?Suicide Prevention Education:  ?Education Completed; Isaac Laud, niece, [phone] has been identified by the patient as the family member/significant other with whom the patient will be residing, and identified as the person(s) who will aid the patient in the event of a mental health crisis (suicidal ideations/suicide attempt).  With written consent from the patient, the family member/significant other has been provided the following suicide prevention education, prior to the and/or following the discharge of the patient. ? ?The suicide prevention education provided includes the following: ?Suicide risk factors ?Suicide prevention and interventions ?National Suicide Hotline telephone number ?[facility] assessment telephone number ?[facility] Emergency Assistance 911 ?[place] and/or Residential Mobile Crisis Unit telephone number ? ?Request made of family/significant other to: ?Remove weapons (e.g., guns, rifles, knives), all items previously/currently identified as safety concern.   ?Remove drugs/medications (over-the-counter, prescriptions, illicit drugs), all items previously/currently identified as a safety concern. ? ?The family member/significant other verbalizes understanding of the suicide prevention education information provided.  The family member/significant other agrees to remove the items of safety concern listed above. ? ?Braylei Totino A [place] ?01/08/2022, 3:52 PM ?

## 2022-01-08 NOTE — Progress Notes (Signed)
Pt sitting in day room; calm, cooperative. Pt is alert to name only; he is disoriented to place time and location. He states that the current year is 52 and he is unable to name the current president, although he is able to name VP "Kamala". Pt was reoriented to time, place and location. Pt states that he feels "okay". He currently denies pain and SI/HI/AVH. He reports that he sleeps "well" and describes his appetite as "good". He denies depression; however, he expresses feelings of anxiety due to "trying to tell reality from the internet; separating fact from fiction." He states that he was admitted to the hospital for "my brain". He says "Let's get my brain fixed. I'd like to be able to separate fiction from reality." No acute distress noted. ?

## 2022-01-08 NOTE — BHH Counselor (Signed)
Adult Comprehensive Assessment ? ?Patient ID: Jay Webster, male   DOB: 1932/07/26, 88 y.o.   MRN: [id number] ? ?Information Source: ?Information source: Patient ? ?Current Stressors:  ?Patient states their primary concerns and needs for treatment are:: "bad attitude" ?Patient states their goals for this hospitilization and ongoing recovery are:: "trying to..idk, feel ready to pull the plug and go" ?Educational / Learning stressors: Pt denies ?Employment / Job issues: Pt is retired ?Family Relationships: Pt denies ?Financial / Lack of resources (include bankruptcy): Pt denies ?Housing / Lack of housing: Pt denies ?Physical health (include injuries & life threatening diseases): Dementia, hard of hearing, ?Social relationships: Pt denies ?Substance abuse: Pt denies ?Bereavement / Loss: "all the people I've known and worked with are gone" ? ?Living/Environment/Situation:  ?Living Arrangements: Alone ?How long has patient lived in current situation?: "don't know" ?What is atmosphere in current home: Comfortable ? ?Family History:  ?Marital status: Single ?Are you sexually active?: No ?What is your sexual orientation?: unable to assess ?Has your sexual activity been affected by drugs, alcohol, medication, or emotional stress?: unable to assess ?Does patient have children?: No ? 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" ?Description of patient's relationship with caregiver when they were a child: Pt states his mother  "kept the family together" and he and his father were "oil and water" ?Patient's description of current relationship with people who raised him/her: Deceawsed ?How were you disciplined when you got in trouble as a child/adolescent?: unable to assess ?Does patient have siblings?: Yes ?Number of Siblings: 4 ?Description of patient's current relationship with siblings: Deceased ?Did patient  suffer any verbal/emotional/physical/sexual abuse as a child?: No ?Did patient suffer from severe childhood neglect?: No ?Has patient ever been sexually abused/assaulted/raped as an adolescent or adult?: No ?Was the patient ever a victim of a crime or a disaster?: No ?Witnessed domestic violence?: No ?Has patient been affected by domestic violence as an adult?: No ? ?Education:  ?Highest grade of school patient has completed: unable to asess ?Currently a student?: No ?Learning disability?: No ? 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 ?Financial Resources:   ?Museum/gallery curator resources: [employer], Information systems manager (retirement savings) ?Does patient have a representative payee or guardian?: No ? ?Alcohol/Substance Abuse:   ?What has been your use of drugs/alcohol within the last 12 months?: Pt denies ?If attempted suicide, did drugs/alcohol play a role in this?: No ?Alcohol/Substance Abuse Treatment Hx: Denies past history ?Has alcohol/substance abuse ever caused legal problems?: No ? ?Social Support System:   ?Patient's Community Support System: Poor ?Describe Community Support System: Pt staes that he has a neice that lives in [place] and a neighbor that his very helpful ?Type of faith/religion: "not really religious" ?How does patient's faith help to cope with current illness?: Pt denies ? ?Leisure/Recreation:   ?Do You Have Hobbies?: No ? ?Strengths/Needs:   ?What is the patient's perception of their strengths?: unable to assess ?Patient states they can use these personal strengths during their treatment to contribute to their recovery: unable to assess ?Patient  states these barriers may affect/interfere  with their treatment: Pt denies ?Patient states these barriers may affect their return to the community: Pt denies ? ?Discharge Plan:   ?Currently receiving community mental health services: No ?Patient states concerns and preferences for aftercare planning are: Pt states that he will continue to see his PCP and is unsure of a referral for psychiatry ?Patient states they will know when they are safe and ready for discharge when: "I don't know" ?Does patient have access to transportation?: Yes ?Does patient have financial barriers related to discharge medications?: No ?Will patient be returning to same living situation after discharge?: Yes ? ?Summary/Recommendations:   ?Summary and Recommendations (to be completed by the evaluator): Patient is a 88 year old male, single, from [place], [place] He reports that he receives SSI and lives off savings. He presents to the hospital following thoughts of suicidal ideation and depressive symptoms. Recent stressors include having no living friends of family who live close by, grief, declining health and passive suicidal ideation and dementia. He has a primary diagnosis of Major depressive disorder, recurrent episode, severe. Patient has Dr. [place] as his PCP and is not interested in referral for psychiatry or therapy. Patient is interested in support for nursing and assistance with groceries/home aid.  Recommendations include: crisis stabilization, therapeutic milieu, encourage group attendance and participation, medication management for mood stabilization and development of comprehensive mental wellness plan. ? ?Catori Panozzo A [place]. 01/08/2022 ?

## 2022-01-08 NOTE — Progress Notes (Incomplete)
? ? ?Assessment/Plan:  ? ?Jay Webster is a very pleasant 88 y.o. year old RH male with  a history of hypertension, hyperlipidemia, anxiety, depression seen today for evaluation of memory loss. MoCA today is ? ? ? Recommendations:  ? ?Memory Loss  ? ?MRI brain with/without contrast to assess for underlying structural abnormality and assess vascular load  ?Neurocognitive testing to further evaluate cognitive concerns and determine other underlying cause of memory changes, including potential contribution from sleep, anxiety, or depression  ?Check B12, TSH ?Discussed safety both in and out of the home.  ?Discussed the importance of regular daily schedule with inclusion of crossword puzzles to maintain brain function.  ?Continue to monitor mood with PCP.  ?Stay active at least 30 minutes at least 3 times a week.  ?Naps should be scheduled and should be no longer than 60 minutes and should not occur after 2 PM.  ?Control cardiovascular risk factors  ?Mediterranean diet is recommended  ?Folllow up once results above are available  ? ?Subjective:  ? ? ?The patient is seen in neurologic consultation at the request of de [place], Blondell Reveal, MD for the evaluation of memory.  The patient is accompanied by  who supplements the history. ?This is a 88 y.o. year old RH  male who has had memory issues for about   ? ?How long did patient have memory difficulties?  ?Patient lives with: Spouse who noticed changes as well.  Patient lives alone ?repeats oneself?  ?Disoriented when walking into a room?  Patient denies   ?Leaving objects in unusual places?  Patient denies   ?Ambulates  with difficulty?   Patient denies   ?Recent falls?  Patient denies   ?Any head injuries?  Patient denies   ?History of seizures?   Patient denies   ?Wandering behavior?  Patient denies   ?Patient drives?   Patient no longer drives  Patient drives with assistance  Patient uses [employer] to drive   ?Any mood changes such irritability agitation?  Patient denies    ?Any history of depression?: Endorsed. Had a recent visit on 5/1 for suicidal ideation ?Hallucinations?  Patient denies   ?Paranoia?  Patient denies   ?Patient reports that he sleeps well without vivid dreams, REM behavior or sleepwalking   Patient reports vivid dreams   ?History of sleep apnea?  Patient denies   ?Any hygiene concerns?  Patient denies   ?Independent of bathing and dressing?  Endorsed  ?Does the patient needs help with medications?  pillbox pill pack  Patient denies   ?Who is in charge of the finances?  Patient is in charge   is in charge   assists the patient  and denies missing any bills   occasionally misses a payment. ?Any changes in appetite?  Patient denies   ?Patient have trouble swallowing? Patient denies   ?Does the patient cook?  Patient denies   ?Any kitchen accidents such as leaving the stove on? Patient denies   ?Any headaches?  Patient denies   ?The double vision? Patient denies   ?Any focal numbness or tingling?  Patient denies   ?Chronic back pain Patient denies   ?Unilateral weakness?  Patient denies   ?Any tremors?  Patient denies   ?Any history of anosmia?  Patient denies   ?Any incontinence of urine?  Patient denies   ?Any bowel dysfunction?   Patient denies     Constipation     diarrhea ?History of heavy alcohol intake?  Patient denies   ?  History of heavy tobacco use?  Patient denies   ?Family history of dementia?  Patient denies    Mother  Father  Sister   Brother   Grandfather  Grandmother  Alzheimer's disease   ? ?Allergies  ?Allergen Reactions  ? Penicillins Swelling, Palpitations and Other (See Comments)  ?  Swelling of joints. ?Has patient had a PCN reaction causing immediate rash, facial/tongue/throat swelling, SOB or lightheadedness with hypotension: Yes ?Has patient had a PCN reaction causing severe rash involving mucus membranes or skin necrosis: No ?Has patient had a PCN reaction that required hospitalization: Yes ?Has patient had a PCN reaction occurring within the  last 10 years: No ?If all of the above answers are "NO", then may proceed with Cephalosporin use. ?  ? Shingrix [Zoster Vac Recomb Adjuvanted] Rash and Other (See Comments)  ?  Severe rash  ? Zoloft [Sertraline Hcl] Rash  ? ? ?Current Outpatient Medications  ?Medication Instructions  ? doxycycline (VIBRAMYCIN) 100 mg, Oral, 2 times daily  ? levothyroxine (SYNTHROID) 125 MCG tablet TAKE 1 TABLET BY MOUTH EVERY DAY  ? ? ? ?VITALS:  There were no vitals filed for this visit. ? ?  12/22/2021  ?  4:09 PM 01/21/2021  ?  1:16 PM 08/20/2020  ?  8:18 AM 05/27/2020  ?  2:38 PM 02/19/2020  ? 10:46 AM  ?Depression screen PHQ 2/9  ?Decreased Interest 0 3 0 0 0  ?Down, Depressed, Hopeless '1 3 1 1 '$ 0  ?PHQ - 2 Score '1 6 1 1 '$ 0  ?Altered sleeping  0 0  0  ?Tired, decreased energy  1 0  0  ?Change in appetite  0 0  1  ?Feeling bad or failure about yourself   3 0  0  ?Trouble concentrating  2 0  0  ?Moving slowly or fidgety/restless  1 0  0  ?Suicidal thoughts  0 1  0  ?PHQ-9 Score  '13 2  1  '$ ?Difficult doing work/chores  Somewhat difficult Not difficult at all  Not difficult at all  ? ? ?PHYSICAL EXAM  ? ?HEENT:  Normocephalic, atraumatic. The mucous membranes are moist. The superficial temporal arteries are without ropiness or tenderness. ?Cardiovascular: Regular rate and rhythm. ?Lungs: Clear to auscultation bilaterally. ?Neck: There are no carotid bruits noted bilaterally. ? ?NEUROLOGICAL: ?   ? View : No data to display.  ?  ?  ?  ?  ?   ? View : No data to display.  ?  ?  ?  ?  ? ?Orientation:  Alert and oriented to person, place and time. No aphasia or dysarthria. Fund of knowledge is appropriate. Recent memory impaired and remote memory intact.  Attention and concentration are normal.  Able to name objects and repeat phrases. Delayed recall   ?Cranial nerves: There is good facial symmetry. Extraocular muscles are intact and visual fields are full to confrontational testing. Speech is fluent and clear. Soft palate rises symmetrically  and there is no tongue deviation. Hearing is intact to conversational tone. ?Tone: Tone is good throughout. ?Sensation: Sensation is intact to light touch and pinprick throughout. Vibration is intact at the bilateral big toe.There is no extinction with double simultaneous stimulation. There is no sensory dermatomal level identified. ?Coordination: The patient has no difficulty with RAM's or FNF bilaterally. Normal finger to nose  ?Motor: Strength is 5/5 in the bilateral upper and lower extremities. There is no pronator drift. There are no fasciculations noted. ?DTR's: Deep tendon reflexes  are 2/4 at the bilateral biceps, triceps, brachioradialis, patella and achilles.  Plantar responses are downgoing bilaterally. ?Gait and Station: The patient is able to ambulate without difficulty.The patient is able to heel toe walk without any difficulty.The patient is able to ambulate in a tandem fashion. The patient is able to stand in the Romberg position. ?  ? ? ?Thank you for allowing [place] the opportunity to participate in the care of this nice patient. Please do not hesitate to contact us for any questions or concerns.  ? ?Total time spent on today's visit was 60 minutes, including both face-to-face time and nonface-to-face time.  Time included that spent on review of records (prior notes available to me/labs/imaging if pertinent), discussing treatment and goals, answering patient's questions and coordinating care. ? ?Cc:  de [place], Blondell Reveal, MD ? ?Sharene Butters ?01/08/2022 8:36 AM   ?
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
## 2022-01-08 NOTE — Progress Notes (Signed)
?   01/08/22 1115  ?Clinical Encounter Type  ?Visited With Patient not available  ?Visit Type Initial  ? ?Chaplain Burris attempted to visit with Pt per consult request. Pt sleeping soundly. Will come back this afternoon or page chaplain as needed.  ?

## 2022-01-08 NOTE — Progress Notes (Signed)
Pt's bed alarm sounding while pt attempting to exit bed. Pt yelled as staff attempted to assist him. Then he stated "Are you going to open the door?" Pt unable to verbalize his current location. He was reoriented to his surroundings. He currently denies pain. Pt left lying in his bed. No acute distress noted. ?

## 2022-01-08 NOTE — Progress Notes (Signed)
Patient denies SI, HI, and AVH. He is pleasant and cooperative with assessment. Patient interacts appropriately with staff and other patients on the unit. Patient ate breakfast in the dayroom and returned to his room. This writer walked patient back to his room and turned his bed alarm turned on due to high fall risk. Patient remains safe on the unit at this time. ?

## 2022-01-08 NOTE — Progress Notes (Addendum)
Charge nurse informed this nurse that patient had been found on the floor by the tech. Pt observed lying on his left side with sheet and blanket wrapped around his waist and BLEs. Pt assisted onto bed by tech and this nurse. Pt denies hitting his head and denies pain; head inspected and no abnormalities noted. No acute distress noted. ?

## 2022-01-08 NOTE — H&P (Addendum)
Psychiatric Admission Assessment Adult ? ?Patient Identification: Jay Webster ?MRN:  [id number] ?Date of Evaluation:  01/08/2022 ?Chief Complaint:  Major Depressive Disorder, recurrent, severe ?Principal Diagnosis: Major Depressive Disorder, recurrent, severe, without psychosis ?Diagnosis:  Major Depressive Disorder, recurrent, severe without psychosis' ?Cognitive and behavioral changes [R41.89, R46.89] ?History of Present Illness: Jay Webster is a 88 year old white male who lives alone and apparently called EMS and told the ER that he was having suicidal thoughts.  He is very pleasant and cooperative and tells me that he has never seen a psychiatrist, denies any past suicide attempts or suicidal ideation.  No history of psychosis.  He has never been married and does not have any kids.  His closest support is a niece in [place].  His biggest complaint is memory loss and loneliness and some depression. ? ?PER INITIAL INTAKE: ?Jay Webster is a 88 y.o. male. Presenting to ER under IVC.  Per report, patient was hitting himself and crying uncontrollably and made statement that he wanted to end it all.  He currently states that he does not have any ongoing thoughts of suicide.  States he did not intend to commit any acts of self-harm or suicide.  Has no thoughts of hurting others.  No homicidal ideation.  Reports that he has a slight headache.  Does not want any medicine for this.  Takes Synthroid but denies any other chronic medications. ? ?Associated Signs/Symptoms: ?Depression Symptoms:  depressed mood, ?anhedonia, ?insomnia, ?anxiety, ?Duration of Depression Symptoms: No data recorded ?(Hypo) Manic Symptoms:  none ?Anxiety Symptoms:  Excessive Worry, ?Psychotic Symptoms:  none ?PTSD Symptoms: ?NA ?Total Time spent with patient: 1 hour ? ?Past Psychiatric History: None ? ?Is the patient at risk to self? Yes.    ?Has the patient been a risk to self in the past 6 months? No  ?Has the patient been a risk to self  within the distant past? No  ?Is the patient a risk to others? No  ?Has the patient been a risk to others in the past 6 months? No.  ?Has the patient been a risk to others within the distant past? No.  ? ?Prior Inpatient Therapy:   ?Prior Outpatient Therapy:   ? ?Alcohol Screening: 1. How often do you have a drink containing alcohol?: Never ?2. How many drinks containing alcohol do you have on a typical day when you are drinking?: 1 or 2 ?3. How often do you have six or more drinks on one occasion?: Never ?AUDIT-C Score: 0 ?4. How often during the last year have you found that you were not able to stop drinking once you had started?: Never ?5. How often during the last year have you failed to do what was normally expected from you because of drinking?: Never ?6. How often during the last year have you needed a first drink in the morning to get yourself going after a heavy drinking session?: Never ?7. How often during the last year have you had a feeling of guilt of remorse after drinking?: Never ?8. How often during the last year have you been unable to remember what happened the night before because you had been drinking?: Never ?9. Have you or someone else been injured as a result of your drinking?: No ?10. Has a relative or friend or a doctor or another health worker been concerned about your drinking or suggested you cut down?: No ?Alcohol Use Disorder Identification Test Final Score (AUDIT): 0 ?Substance Abuse History in the last  12 months:  No. ?Consequences of Substance Abuse: ?NA ?Previous Psychotropic Medications: No  ?Psychological Evaluations: No  ?Past Medical History:  ?Past Medical History:  ?Diagnosis Date  ? Arthritis   ? OA AND PAIN LEFT HIP AND OTHER JOINT PAINS  ? Cancer [facility])   ? THYROID CANCER - HAD THYROID REMOVED  ? History of kidney stones   ? History of shingles   ? RESOLVED  ? Hypertension   ? Hypothyroidism   ? Low back pain   ? Macular degeneration of both eyes   ? RBBB (right bundle  branch block with left anterior fascicular block)   ? Seasonal allergies   ? Thyroid disease   ? Uric acid renal calculus 06/20/2007  ? Qualifier: Diagnosis of  By: Rogue Bussing CMA, Jacqualynn    ?  ?Past Surgical History:  ?Procedure Laterality Date  ? APPENDECTOMY  1948  ? CATARACT EXTRACTION Right 08/27/2019  ? COLONOSCOPY    ? Elbow Radical Reduction  1973  ? IR IMAGING GUIDED PORT INSERTION  05/05/2018  ? IR REMOVAL TUN ACCESS W/ PORT W/O FL MOD SED  10/18/2018  ? [place]  ? MASS EXCISION N/A 03/11/2018  ? Procedure: EXCISION MASS OF PERINEUM;  Surgeon: Armandina Gemma, MD;  Location: [facility];  Service: General;  Laterality: N/A;  ? REMOVAL OF FIRST RIB   [place]  ? FOR COMPRESSED VEIN BETWEEN 1 ST RIB AND COLLARBONE  ? Rib removed, 1st  1994  ? ROTATOR CUFF REPAIR  2009  ? THYROIDECTOMY  2010  ? TOTAL HIP ARTHROPLASTY Left 05/31/2013  ? Procedure: LEFT TOTAL HIP ARTHROPLASTY ANTERIOR APPROACH;  Surgeon: Gearlean Alf, MD;  Location: [facility];  Service: Orthopedics;  Laterality: Left;  ? ?Family History:  ?Family History  ?Problem Relation Age of Onset  ? Heart disease Mother   ?     passed in 26s  ? Diabetes Mother   ? Alcohol abuse Father   ?     passed from this  ? Parkinson's disease Sister   ? Diabetes Brother   ? Dementia Sister   ? Cancer Brother   ?     sounds like bone cancer- non healing knee injury  ? ?Family Psychiatric  History: No ?Tobacco Screening:   ?Social History:  ?Social History  ? ?Substance and Sexual Activity  ?Alcohol Use Not Currently  ? Alcohol/week: 0.0 - 1.0 standard drinks  ?   ?Social History  ? ?Substance and Sexual Activity  ?Drug Use No  ?  ?Additional Social History: ?  ?   ?  ?  ?  ?  ?  ?  ?  ?  ?  ?  ? ?Allergies:   ?Allergies  ?Allergen Reactions  ? Penicillins Swelling, Palpitations and Other (See Comments)  ?  Swelling of joints. ?Has patient had a PCN reaction causing immediate rash, facial/tongue/throat swelling, SOB or lightheadedness with hypotension:  Yes ?Has patient had a PCN reaction causing severe rash involving mucus membranes or skin necrosis: No ?Has patient had a PCN reaction that required hospitalization: Yes ?Has patient had a PCN reaction occurring within the last 10 years: No ?If all of the above answers are "NO", then may proceed with Cephalosporin use. ?  ? Shingrix [Zoster Vac Recomb Adjuvanted] Rash and Other (See Comments)  ?  Severe rash  ? Zoloft [Sertraline Hcl] Rash  ? ?Lab Results: No results found for this or any previous visit (from the past  48 hour(s)). ? ?Blood Alcohol level:  ?Lab Results  ?Component Value Date  ? ETH <10 01/05/2022  ? ? ?Metabolic Disorder Labs:  ?Lab Results  ?Component Value Date  ? HGBA1C 5.7 (H) 08/20/2020  ? MPG 117 08/20/2020  ? ?No results found for: PROLACTIN ?Lab Results  ?Component Value Date  ? CHOL 146 02/19/2020  ? TRIG 63.0 02/19/2020  ? HDL 63.90 02/19/2020  ? CHOLHDL 2 02/19/2020  ? VLDL 12.6 02/19/2020  ? [place] 69 02/19/2020  ? [place] 74 05/04/2019  ? ? ?Current Medications: ?Current Facility-Administered Medications  ?Medication Dose Route Frequency Provider Last Rate Last Admin  ? alum & mag hydroxide-simeth (MAALOX/MYLANTA) 200-200-20 MG/5ML suspension 30 mL  30 mL Oral Q4H PRN Parks Ranger, DO      ? levothyroxine (SYNTHROID) tablet 125 mcg  125 mcg Oral Daily Parks Ranger, DO      ? LORazepam (ATIVAN) tablet 0.5 mg  0.5 mg Oral Q4H PRN Parks Ranger, DO      ? magnesium hydroxide (MILK OF MAGNESIA) suspension 30 mL  30 mL Oral Daily PRN Parks Ranger, DO      ? mirtazapine (REMERON) tablet 15 mg  15 mg Oral QHS Parks Ranger, DO   15 mg at 01/07/22 2141  ? OLANZapine (ZYPREXA) tablet 5 mg  5 mg Oral Q6H PRN Parks Ranger, DO      ? traZODone (DESYREL) tablet 100 mg  100 mg Oral QHS PRN Parks Ranger, DO      ? ?PTA Medications: ?Medications Prior to Admission  ?Medication Sig Dispense Refill Last Dose  ? doxycycline  (VIBRAMYCIN) 100 MG capsule Take 1 capsule (100 mg total) by mouth 2 (two) times daily. (Patient not taking: Reported on 01/06/2022) 20 capsule 0   ? levothyroxine (SYNTHROID) 125 MCG tablet TAKE 1 TABLET BY MOUTH EVERY

## 2022-01-08 NOTE — Progress Notes (Signed)
Jay Webster (listed in chart as POA) notified of unwitnessed fall. He states that he is unsure whether he is POA because "Mrs. Gwenette Greet asked me to be his executor and his POA." He also states that he is in [place] until 01/16/2022 so he is unable to be present at the hospital until then. ?
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
## 2022-01-08 NOTE — BHH Suicide Risk Assessment (Signed)
[facility] Admission Suicide Risk Assessment ? ? ?Nursing information obtained from:  Patient ?Demographic factors:  Male, Age 88 or older, Caucasian, Living alone, Unemployed ?Current Mental Status:  Self-harm behaviors, Self-harm thoughts (hitting self on head) ?Loss Factors:  Decline in physical health, Decrease in vocational status ?Historical Factors:  Impulsivity ?Risk Reduction Factors:  Positive social support, Sense of responsibility to family ("My neighbours help me a lot. My niece in [place] checks on me too") ? ?Total Time spent with patient: 1 hour ?Principal Problem: Cognitive and behavioral changes ?Diagnosis:  Principal Problem: ?  Cognitive and behavioral changes ? ?Subjective Data: Jay Webster is a 88 y.o. male. Presenting to ER under IVC.  Per report, patient was hitting himself and crying uncontrollably and made statement that he wanted to end it all.  He currently states that he does not have any ongoing thoughts of suicide.  States he did not intend to commit any acts of self-harm or suicide.  Has no thoughts of hurting others.  No homicidal ideation.  Reports that he has a slight headache.  Does not want any medicine for this.  Takes Synthroid but denies any other chronic medications. ? ?Continued Clinical Symptoms:  ?Alcohol Use Disorder Identification Test Final Score (AUDIT): 0 ?The "Alcohol Use Disorders Identification Test", Guidelines for Use in Primary Care, Second Edition.  World Pharmacologist [facility]). ?Score between 0-7:  no or low risk or alcohol related problems. ?Score between 8-15:  moderate risk of alcohol related problems. ?Score between 16-19:  high risk of alcohol related problems. ?Score 20 or above:  warrants further diagnostic evaluation for alcohol dependence and treatment. ? ? ?CLINICAL FACTORS:  ? Depression:   Anhedonia ? ? ?Musculoskeletal: ?Strength & Muscle Tone: within normal limits ?Gait & Station: normal ?Patient leans: N/A ? ?Psychiatric Specialty  Exam: ? ?Presentation  ?General Appearance: No data recorded ?Eye Contact:No data recorded ?Speech:No data recorded ?Speech Volume:No data recorded ?Handedness:No data recorded ? ?Mood and Affect  ?Mood:No data recorded ?Affect:No data recorded ? ?Thought Process  ?Thought Processes:No data recorded ?Descriptions of Associations:No data recorded ?Orientation:No data recorded ?Thought Content:No data recorded ?History of Schizophrenia/Schizoaffective disorder:No data recorded ?Duration of Psychotic Symptoms:No data recorded ?Hallucinations:No data recorded ?Ideas of Reference:No data recorded ?Suicidal Thoughts:No data recorded ?Homicidal Thoughts:No data recorded ? ?Sensorium  ?Memory:No data recorded ?Judgment:No data recorded ?Insight:No data recorded ? ?Executive Functions  ?Concentration:No data recorded ?Attention Span:No data recorded ?Recall:No data recorded ?[place] of [place] recorded ?Language:No data recorded ? ?Psychomotor Activity  ?Psychomotor Activity:No data recorded ? ?Assets  ?Assets:No data recorded ? ?Sleep  ?Sleep:No data recorded ? ? ? ?Blood pressure (!) 173/85, pulse 67, temperature 98.1 ?F (36.7 ?C), temperature source Oral, resp. rate 20, height '5\' 4"'$  (1.626 m), weight 84.3 kg, SpO2 99 %. Body mass index is 31.91 kg/m?. ? ? ?COGNITIVE FEATURES THAT CONTRIBUTE TO RISK:  ?None   ? ?SUICIDE RISK:  ? Minimal: No identifiable suicidal ideation.  Patients presenting with no risk factors but with morbid ruminations; may be classified as minimal risk based on the severity of the depressive symptoms ? ?PLAN OF CARE: See orders ? ?I certify that inpatient services furnished can reasonably be expected to improve the patient's condition.  ? ?Parks Ranger, DO ?01/08/2022, 10:27 AM ? ?

## 2022-01-09 DIAGNOSIS — R4189 Other symptoms and signs involving cognitive functions and awareness: Secondary | ICD-10-CM | POA: Diagnosis not present

## 2022-01-09 DIAGNOSIS — R4689 Other symptoms and signs involving appearance and behavior: Secondary | ICD-10-CM | POA: Diagnosis not present

## 2022-01-09 MED ORDER — HALOPERIDOL 1 MG PO TABS
0.5000 mg | ORAL_TABLET | ORAL | Status: DC
  Administered 2022-01-09 – 2022-01-21 (×24): 0.5 mg via ORAL
  Filled 2022-01-09 (×25): qty 1

## 2022-01-09 MED ORDER — DONEPEZIL HCL 5 MG PO TABS
5.0000 mg | ORAL_TABLET | Freq: Every day | ORAL | Status: DC
  Administered 2022-01-09 – 2022-01-29 (×21): 5 mg via ORAL
  Filled 2022-01-09 (×21): qty 1

## 2022-01-09 NOTE — Progress Notes (Signed)
Recreation Therapy Notes ? ? ?Date: 01/09/2022 ?  ?Time: 1:45pm  ?  ?Location: Court yard  ? ?Behavioral response: N/A ?  ?Intervention Topic: Leisure  ?  ?Discussion/Intervention: ?Patient refused to attend group.  ?  ?Clinical Observations/Feedback:  ?Patient refused to attend group.  ?  ?Kyndle Schlender LRT/CTRS ? ? ? ? ? ? ? ?Commodore Bellew ?01/09/2022 2:54 PM ?

## 2022-01-09 NOTE — BH IP Treatment Plan (Signed)
Interdisciplinary Treatment and Diagnostic Plan Update ? ?01/09/2022 ?Time of Session: 10:00AM ?Jay Webster ?MRN: [id number] ? ?Principal Diagnosis: Major depressive disorder, recurrent episode, severe ([place]) ? ?Secondary Diagnoses: Principal Problem: ?  Major depressive disorder, recurrent episode, severe ([place]) ?Active Problems: ?  Cognitive and behavioral changes ? ? ?Current Medications:  ?Current Facility-Administered Medications  ?Medication Dose Route Frequency Provider Last Rate Last Admin  ? alum & mag hydroxide-simeth (MAALOX/MYLANTA) 200-200-20 MG/5ML suspension 30 mL  30 mL Oral Q4H PRN Parks Ranger, DO      ? donepezil (ARICEPT) tablet 5 mg  5 mg Oral QHS Parks Ranger, DO      ? haloperidol (HALDOL) tablet 0.5 mg  0.5 mg Oral BH-q8a4p Parks Ranger, DO      ? levothyroxine (SYNTHROID) tablet 125 mcg  125 mcg Oral Daily Parks Ranger, DO   125 mcg at 01/09/22 5638  ? LORazepam (ATIVAN) tablet 0.5 mg  0.5 mg Oral Q4H PRN Parks Ranger, DO      ? magnesium hydroxide (MILK OF MAGNESIA) suspension 30 mL  30 mL Oral Daily PRN Parks Ranger, DO      ? mirtazapine (REMERON) tablet 15 mg  15 mg Oral QHS Parks Ranger, DO   15 mg at 01/08/22 2121  ? OLANZapine (ZYPREXA) tablet 5 mg  5 mg Oral Q6H PRN Parks Ranger, DO      ? traZODone (DESYREL) tablet 100 mg  100 mg Oral QHS PRN Parks Ranger, DO      ? ?PTA Medications: ?Medications Prior to Admission  ?Medication Sig Dispense Refill Last Dose  ? doxycycline (VIBRAMYCIN) 100 MG capsule Take 1 capsule (100 mg total) by mouth 2 (two) times daily. (Patient not taking: Reported on 01/06/2022) 20 capsule 0   ? levothyroxine (SYNTHROID) 125 MCG tablet TAKE 1 TABLET BY MOUTH EVERY DAY (Patient taking differently: Take 125 mcg by mouth daily before breakfast.) 30 tablet 0   ? ? ?Patient Stressors: Health problems   ?Medication change or noncompliance   ? ?Patient Strengths:  Financial means  ?Special hobby/interest  ?Supportive family/friends  ? ?Treatment Modalities: Medication Management, Group therapy, Case management,  ?1 to 1 session with clinician, Psychoeducation, Recreational therapy. ? ? ?Physician Treatment Plan for Primary Diagnosis: Major depressive disorder, recurrent episode, severe ([place]) ?Long Term Goal(s): Improvement in symptoms so as ready for discharge  ? ?Short Term Goals: Ability to identify changes in lifestyle to reduce recurrence of condition will improve ?Ability to verbalize feelings will improve ?Ability to disclose and discuss suicidal ideas ?Ability to demonstrate self-control will improve ?Ability to identify and develop effective coping behaviors will improve ?Ability to maintain clinical measurements within normal limits will improve ?Compliance with prescribed medications will improve ?Ability to identify triggers associated with substance abuse/mental health issues will improve ? ?Medication Management: Evaluate patient's response, side effects, and tolerance of medication regimen. ? ?Therapeutic Interventions: 1 to 1 sessions, Unit Group sessions and Medication administration. ? ?Evaluation of Outcomes: Not Met ? ?Physician Treatment Plan for Secondary Diagnosis: Principal Problem: ?  Major depressive disorder, recurrent episode, severe ([place]) ?Active Problems: ?  Cognitive and behavioral changes ? ?Long Term Goal(s): Improvement in symptoms so as ready for discharge  ? ?Short Term Goals: Ability to identify changes in lifestyle to reduce recurrence of condition will improve ?Ability to verbalize feelings will improve ?Ability to disclose and discuss suicidal ideas ?Ability to demonstrate self-control will improve ?Ability to identify and develop  effective coping behaviors will improve ?Ability to maintain clinical measurements within normal limits will improve ?Compliance with prescribed medications will improve ?Ability to identify triggers associated  with substance abuse/mental health issues will improve    ? ?Medication Management: Evaluate patient's response, side effects, and tolerance of medication regimen. ? ?Therapeutic Interventions: 1 to 1 sessions, Unit Group sessions and Medication administration. ? ?Evaluation of Outcomes: Not Met ? ? ?RN Treatment Plan for Primary Diagnosis: Major depressive disorder, recurrent episode, severe ([place]) ?Long Term Goal(s): Knowledge of disease and therapeutic regimen to maintain health will improve ? ?Short Term Goals: Ability to remain free from injury will improve, Ability to verbalize frustration and anger appropriately will improve, Ability to demonstrate self-control, Ability to participate in decision making will improve, Ability to verbalize feelings will improve, Ability to disclose and discuss suicidal ideas, Ability to identify and develop effective coping behaviors will improve, and Compliance with prescribed medications will improve ? ?Medication Management: RN will administer medications as ordered by provider, will assess and evaluate patient's response and provide education to patient for prescribed medication. RN will report any adverse and/or side effects to prescribing provider. ? ?Therapeutic Interventions: 1 on 1 counseling sessions, Psychoeducation, Medication administration, Evaluate responses to treatment, Monitor vital signs and CBGs as ordered, Perform/monitor CIWA, COWS, AIMS and Fall Risk screenings as ordered, Perform wound care treatments as ordered. ? ?Evaluation of Outcomes: Not Met ? ? ?LCSW Treatment Plan for Primary Diagnosis: Major depressive disorder, recurrent episode, severe ([place]) ?Long Term Goal(s): Safe transition to appropriate next level of care at discharge, Engage patient in therapeutic group addressing interpersonal concerns. ? ?Short Term Goals: Engage patient in aftercare planning with referrals and resources, Increase social support, Increase ability to appropriately  verbalize feelings, Increase emotional regulation, Facilitate acceptance of mental health diagnosis and concerns, Identify triggers associated with mental health/substance abuse issues, and Increase skills for wellness and recovery ? ?Therapeutic Interventions: Assess for all discharge needs, 1 to 1 time with Education officer, museum, Explore available resources and support systems, Assess for adequacy in community support network, Educate family and significant other(s) on suicide prevention, Complete Psychosocial Assessment, Interpersonal group therapy. ? ?Evaluation of Outcomes: Not Met ? ? ?Progress in Treatment: ?Attending groups: No. ?Participating in groups: No. ?Taking medication as prescribed: Yes. ?Toleration medication: Yes. ?Family/Significant other contact made: Yes, individual(s) contacted:  SPE completed with pt's niece,  Nevin Bloodgood O'[place] ?Patient understands diagnosis: No. ?Discussing patient identified problems/goals with staff: Yes. ?Medical problems stabilized or resolved: Yes. ?Denies suicidal/homicidal ideation: No. ?Issues/concerns per patient self-inventory: No. ?Other: None ? ?New problem(s) identified: No, Describe:  None ? ?New Short Term/Long Term Goal(s): Patient to work towards medication management for mood stabilization; elimination of SI thoughts; development of comprehensive mental wellness/ plan. ? ? ?Patient Goals:  "feel ready to go" ? ?Discharge Plan or Barriers: CSW will assist pt with development of appropriate discharge/aftercare plan ? ?Reason for Continuation of Hospitalization: Depression ?Medication stabilization ?Suicidal ideation ? ?Estimated Length of Stay: TBD ? ?Last 3 [place] Suicide Severity Risk Score: ?[place] Admission (Current) from 01/07/2022 in [facility] from 01/05/2022 in [facility] from 10/27/2021 in [facility]  ?C-SSRS RISK CATEGORY High Risk High Risk No Risk  ? ?   ? ? ?Last PHQ 2/9 Scores: ? ?  12/22/2021  ?  4:09 PM 01/21/2021  ?  1:16 PM 08/20/2020  ?  8:18 AM  ?Depression screen PHQ 2/9  ?Decreased Interest 0  3 0  ?Down, Depressed, Hopeless '1 3 1  ' ?PHQ - 2 Score '1 6 1  ' ?[place]

## 2022-01-09 NOTE — Progress Notes (Signed)
Recreation Therapy Notes ? ?INPATIENT RECREATION TR PLAN ? ?Patient Details ?Name: Jay Webster ?MRN: [id number] ?DOB: 1932-04-13 ?Today's Date: 01/09/2022 ? ?Rec Therapy Plan ?Is patient appropriate for Therapeutic Recreation?: Yes ?Treatment times per week: at least 3 ?Estimated Length of Stay: 5-7 days ?TR Treatment/Interventions: Group participation (Comment) ? ?Discharge Criteria ?Pt will be discharged from therapy if:: Discharged ?Treatment plan/goals/alternatives discussed and agreed upon by:: Patient/family ? ?Discharge Summary ?  ? ? ?Deserea Bordley ?01/09/2022, 3:53 PM ?

## 2022-01-09 NOTE — Progress Notes (Signed)
Recreation Therapy Notes ? ?INPATIENT RECREATION THERAPY ASSESSMENT ? ?Patient Details ?Name: Jay Webster ?MRN: [id number] ?DOB: 1932/03/20 ?Today's Date: 01/09/2022 ?      ?Information Obtained From: ?Patient ? ?Able to Participate in Assessment/Interview: ?Yes ? ?Patient Presentation: ?Responsive ? ?Reason for Admission (Per Patient): ?Active Symptoms ? ?Patient Stressors: ?Death, Friends ? ?Coping Skills:   ? (Taking a rest) ? ?Leisure Interests (2+):  ? (Nothing much) ? ?Frequency of Recreation/Participation: ?Monthly ? ?Awareness of Community Resources:  ?No ? ?Community Resources:  ?  ? ?Current Use: ?  ? ?If no, Barriers?: ?  ? ?Expressed Interest in [employer] Information: ?Yes ? ?[place] of Residence:  ?[place] ? ?Patient Main Form of Transportation: ?Car ? ?Patient Strengths:  ?N/A ? ?Patient Identified Areas of Improvement:  ?Getting support ? ?Patient Goal for Hospitalization:  ?My thinking ? ?Current SI (including self-harm):  ?No ? ?Current HI:  ?No ? ?Current AVH: ?No ? ?Staff Intervention Plan: ?Group Attendance, Collaborate with Interdisciplinary Treatment Team ? ?Consent to Intern Participation: ?N/A ? ?Zainah Steven ?01/09/2022, 3:58 PM ?

## 2022-01-09 NOTE — Progress Notes (Signed)
Patient denies SI, HI, and AVH. He is calm and cooperative with assessment. Patient is axox1. He has memory loss and bouts of confusion. Patient was assisted to to bathroom. While sitting on the toilet, patient told this writer he needed help because he didn't know how to get prescription glasses. Patient was brought back to his bed and bed alarm turned on. Patient remains safe on the unit at this time. ?

## 2022-01-09 NOTE — Progress Notes (Signed)
Patient is seen near the nurses station with front wheel walker. He is calm and cooperative. He denies SI, HI, AVH, and pain. He is AAOx1 knowing only his name and DOB. He denies anxiety and depression. He is medication compliant. He wakes up intermittently at night yelling and is confused/disoriented. Pt needs to be re-oriented frequently. He is safe on the unit at this time. Q 15 minute safety checks in place.  ?
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
## 2022-01-10 DIAGNOSIS — F332 Major depressive disorder, recurrent severe without psychotic features: Secondary | ICD-10-CM | POA: Diagnosis not present

## 2022-01-10 LAB — LIPID PANEL
Cholesterol: 146 mg/dL (ref 0–200)
HDL: 62 mg/dL (ref >40)
LDL Cholesterol: 74 mg/dL (ref 0–99)
Total CHOL/HDL Ratio: 2.4 RATIO
Triglycerides: 52 mg/dL (ref <150)
VLDL: 10 mg/dL (ref 0–40)

## 2022-01-10 MED ORDER — MIRTAZAPINE 15 MG PO TABS
30.0000 mg | ORAL_TABLET | Freq: Every day | ORAL | Status: DC
Start: 2022-01-10 — End: 2022-01-11
  Administered 2022-01-10: 30 mg via ORAL
  Filled 2022-01-10 (×2): qty 2

## 2022-01-10 NOTE — Progress Notes (Signed)
Patient denies SI, HI, and AVH. Patient knows his name and DOB and tells this Probation officer he is in the hospital. However, he does not know which hospital or what year it is. Patient was incontinent of urine when woken up for breakfast this morning. Patient was changed into clean scrubs. Patient makes negative comments about himself, stating, "I am a pain in the ass". Patient was provided support and encouragement.  ? ?After breakfast, patient wanted to return to his bed to lay down. Patient was told to wait in the dayroom while staff changed his soiled bed linens. Patient became agitated and threw his walker out in front of him. Patient also began cursing and put up his middle finger at staff.  ? ?Patient is compliant with scheduled haldol this morning. Patient remains safe on the unit at this time. ?

## 2022-01-10 NOTE — Progress Notes (Signed)
[facility] MD Progress Note ? ?01/10/2022 3:56 PM ?Jay Webster  ?MRN:  [id number] ?Subjective: Follow-up for this 88 year old man with apparent depression.  Patient seen and chart reviewed.  He is spending almost all of his time in bed.  He was easily awakable  Did not speak much though.  Patient said he could not tell me why he was here in the hospital or what was wrong but that he was feeling very bad.  Does not seem to be eating very well. ?Principal Problem: Major depressive disorder, recurrent episode, severe ([place]) ?Diagnosis: Principal Problem: ?  Major depressive disorder, recurrent episode, severe ([place]) ?Active Problems: ?  Cognitive and behavioral changes ? ?Total Time spent with patient: 30 minutes ? ?Past Psychiatric History: Unclear but apparently no past history except for some cognitive decline ? ?Past Medical History:  ?Past Medical History:  ?Diagnosis Date  ? Arthritis   ? OA AND PAIN LEFT HIP AND OTHER JOINT PAINS  ? Cancer [facility])   ? THYROID CANCER - HAD THYROID REMOVED  ? History of kidney stones   ? History of shingles   ? RESOLVED  ? Hypertension   ? Hypothyroidism   ? Low back pain   ? Macular degeneration of both eyes   ? RBBB (right bundle branch block with left anterior fascicular block)   ? Seasonal allergies   ? Thyroid disease   ? Uric acid renal calculus 06/20/2007  ? Qualifier: Diagnosis of  By: Rogue Bussing CMA, Jacqualynn    ?  ?Past Surgical History:  ?Procedure Laterality Date  ? APPENDECTOMY  1948  ? CATARACT EXTRACTION Right 08/27/2019  ? COLONOSCOPY    ? Elbow Radical Reduction  1973  ? IR IMAGING GUIDED PORT INSERTION  05/05/2018  ? IR REMOVAL TUN ACCESS W/ PORT W/O FL MOD SED  10/18/2018  ? [place]  ? MASS EXCISION N/A 03/11/2018  ? Procedure: EXCISION MASS OF PERINEUM;  Surgeon: Armandina Gemma, MD;  Location: [facility];  Service: General;  Laterality: N/A;  ? REMOVAL OF FIRST RIB   [place]  ? FOR COMPRESSED VEIN BETWEEN 1 ST RIB AND COLLARBONE  ? Rib removed, 1st   1994  ? ROTATOR CUFF REPAIR  2009  ? THYROIDECTOMY  2010  ? TOTAL HIP ARTHROPLASTY Left 05/31/2013  ? Procedure: LEFT TOTAL HIP ARTHROPLASTY ANTERIOR APPROACH;  Surgeon: Gearlean Alf, MD;  Location: [facility];  Service: Orthopedics;  Laterality: Left;  ? ?Family History:  ?Family History  ?Problem Relation Age of Onset  ? Heart disease Mother   ?     passed in 51s  ? Diabetes Mother   ? Alcohol abuse Father   ?     passed from this  ? Parkinson's disease Sister   ? Diabetes Brother   ? Dementia Sister   ? Cancer Brother   ?     sounds like bone cancer- non healing knee injury  ? ?Family Psychiatric  History: See previous ?Social History:  ?Social History  ? ?Substance and Sexual Activity  ?Alcohol Use Not Currently  ? Alcohol/week: 0.0 - 1.0 standard drinks  ?   ?Social History  ? ?Substance and Sexual Activity  ?Drug Use No  ?  ?Social History  ? ?Socioeconomic History  ? Marital status: Widowed  ?  Spouse name: Not on file  ? Number of children: Not on file  ? Years of education: Not on file  ? Highest education level: Not on  file  ?Occupational History  ? Occupation: retired  ?Tobacco Use  ? Smoking status: Former  ?  Types: Cigarettes  ?  Quit date: 02/22/1979  ?  Years since quitting: 42.9  ? Smokeless tobacco: Never  ?Vaping Use  ? Vaping Use: Never used  ?Substance and Sexual Activity  ? Alcohol use: Not Currently  ?  Alcohol/week: 0.0 - 1.0 standard drinks  ? Drug use: No  ? Sexual activity: Not Currently  ?Other Topics Concern  ? Not on file  ?Social History Narrative  ? Lives alone and 1 cat. Divorced from mongomous long term partner and eventual husband. No children from prior relationships.   ?   ? Retired Investment banker, corporate  ?   ? Hobbies: socializing, used to play tennis  ? ?Social Determinants of Health  ? ?Financial Resource Strain: Not on file  ?Food Insecurity: Not on file  ?Transportation Needs: Not on file  ?Physical Activity: Not on file  ?Stress: Not on file  ?Social Connections: Not on file   ? ?Additional Social History:  ?  ?  ?  ?  ?  ?  ?  ?  ?  ?  ?  ? ?Sleep: Fair ? ?Appetite:  Poor ? ?Current Medications: ?Current Facility-Administered Medications  ?Medication Dose Route Frequency Provider Last Rate Last Admin  ? alum & mag hydroxide-simeth (MAALOX/MYLANTA) 200-200-20 MG/5ML suspension 30 mL  30 mL Oral Q4H PRN Parks Ranger, DO      ? donepezil (ARICEPT) tablet 5 mg  5 mg Oral QHS Parks Ranger, DO   5 mg at 01/09/22 2107  ? haloperidol (HALDOL) tablet 0.5 mg  0.5 mg Oral BH-q8a4p Parks Ranger, DO   0.5 mg at 01/10/22 4166  ? levothyroxine (SYNTHROID) tablet 125 mcg  125 mcg Oral Daily Parks Ranger, DO   125 mcg at 01/10/22 0550  ? LORazepam (ATIVAN) tablet 0.5 mg  0.5 mg Oral Q4H PRN Parks Ranger, DO      ? magnesium hydroxide (MILK OF MAGNESIA) suspension 30 mL  30 mL Oral Daily PRN Parks Ranger, DO      ? mirtazapine (REMERON) tablet 30 mg  30 mg Oral QHS Harshith Pursell T, MD      ? OLANZapine (ZYPREXA) tablet 5 mg  5 mg Oral Q6H PRN Parks Ranger, DO      ? traZODone (DESYREL) tablet 100 mg  100 mg Oral QHS PRN Parks Ranger, DO      ? ? ?Lab Results: No results found for this or any previous visit (from the past 9 hour(s)). ? ?Blood Alcohol level:  ?Lab Results  ?Component Value Date  ? ETH <10 01/05/2022  ? ? ?Metabolic Disorder Labs: ?Lab Results  ?Component Value Date  ? HGBA1C 5.7 (H) 08/20/2020  ? MPG 117 08/20/2020  ? ?No results found for: PROLACTIN ?Lab Results  ?Component Value Date  ? CHOL 146 02/19/2020  ? TRIG 63.0 02/19/2020  ? HDL 63.90 02/19/2020  ? CHOLHDL 2 02/19/2020  ? VLDL 12.6 02/19/2020  ? [place] 69 02/19/2020  ? [place] 74 05/04/2019  ? ? ?Physical Findings: ?AIMS: Facial and Oral Movements ?Muscles of Facial Expression: None, normal ?Lips and Perioral Area: None, normal ?Jaw: None, normal ?Tongue: None, normal,Extremity Movements ?Upper (arms, wrists, hands, fingers): None,  normal ?Lower (legs, knees, ankles, toes): None, normal, Trunk Movements ?Neck, shoulders, hips: None, normal, Overall Severity ?Severity of abnormal movements (highest score from questions above): None, normal ?  Incapacitation due to abnormal movements: None, normal ?Patient's awareness of abnormal movements (rate only patient's report): No Awareness, Dental Status ?Current problems with teeth and/or dentures?: No ?Does patient usually wear dentures?: No  ?CIWA:    ?COWS:    ? ?Musculoskeletal: ?Strength & Muscle Tone: decreased ?Gait & Station:  Uncertain as he did not get up ?Patient leans: N/A ? ?Psychiatric Specialty Exam: ? ?Presentation  ?General Appearance: No data recorded ?Eye Contact:No data recorded ?Speech:No data recorded ?Speech Volume:No data recorded ?Handedness:No data recorded ? ?Mood and Affect  ?Mood:No data recorded ?Affect:No data recorded ? ?Thought Process  ?Thought Processes:No data recorded ?Descriptions of Associations:No data recorded ?Orientation:No data recorded ?Thought Content:No data recorded ?History of Schizophrenia/Schizoaffective disorder:No data recorded ?Duration of Psychotic Symptoms:No data recorded ?Hallucinations:No data recorded ?Ideas of Reference:No data recorded ?Suicidal Thoughts:No data recorded ?Homicidal Thoughts:No data recorded ? ?Sensorium  ?Memory:No data recorded ?Judgment:No data recorded ?Insight:No data recorded ? ?Executive Functions  ?Concentration:No data recorded ?Attention Span:No data recorded ?Recall:No data recorded ?[place] of [place] recorded ?Language:No data recorded ? ?Psychomotor Activity  ?Psychomotor Activity:No data recorded ? ?Assets  ?Assets:No data recorded ? ?Sleep  ?Sleep:No data recorded ? ? ?Physical Exam: ?Physical Exam ?Vitals and nursing note reviewed.  ?Constitutional:   ?   Appearance: Normal appearance.  ?HENT:  ?   Head: Normocephalic and atraumatic.  ?   Mouth/Throat:  ?   Pharynx: Oropharynx is clear.  ?Eyes:  ?    Pupils: Pupils are equal, round, and reactive to light.  ?Cardiovascular:  ?   Rate and Rhythm: Normal rate and regular rhythm.  ?Pulmonary:  ?   Effort: Pulmonary effort is normal.  ?   Breath sounds: Normal breath so

## 2022-01-10 NOTE — Progress Notes (Signed)
Patient is in bedroom at the beginning of the shift. He appears sullen. He denies SI, HI, AVH, anxiety, and depression. He had an incontinent episode at 2130. Pt was cleaned and dried. He is AAOx1 but more oriented than yesterday. He is medication complaint. He is safe on the unit at this time. Q 15 minute safety checks in place.  ?

## 2022-01-10 NOTE — Group Note (Signed)
[facility] LCSW Group Therapy Note ? ? ?Group Date: 01/10/2022 ?Start Time: 1345 ?End Time: 1445 ? ? ?Type of Therapy/Topic:  Group Therapy:  Emotion Regulation ? ?Participation Level:  Did Not Attend  ? ? ? ?Description of Group:   ? The purpose of this group is to assist patients in learning to regulate negative emotions and experience positive emotions. Patients will be guided to discuss ways in which they have been vulnerable to their negative emotions. These vulnerabilities will be juxtaposed with experiences of positive emotions or situations, and patients challenged to use positive emotions to combat negative ones. Special emphasis will be placed on coping with negative emotions in conflict situations, and patients will process healthy conflict resolution skills. ? ?Therapeutic Goals: ?Patient will identify two positive emotions or experiences to reflect on in order to balance out negative emotions:  ?Patient will label two or more emotions that they find the most difficult to experience:  ?Patient will be able to demonstrate positive conflict resolution skills through discussion or role plays:  ? ?Summary of Patient Progress: ? ? ?X ? ? ? ?Therapeutic Modalities:   ?Cognitive Behavioral Therapy ?Feelings Identification ?Dialectical Behavioral Therapy ? ? ?Ervan Heber A Nataleigh Griffin, LCSWA ?

## 2022-01-11 DIAGNOSIS — F332 Major depressive disorder, recurrent severe without psychotic features: Secondary | ICD-10-CM | POA: Diagnosis not present

## 2022-01-11 LAB — HEMOGLOBIN A1C
Hgb A1c MFr Bld: 5.6 % (ref 4.8–5.6)
Mean Plasma Glucose: 114.02 mg/dL

## 2022-01-11 MED ORDER — MIRTAZAPINE 15 MG PO TABS
45.0000 mg | ORAL_TABLET | Freq: Every day | ORAL | Status: DC
  Administered 2022-01-11 – 2022-01-21 (×11): 45 mg via ORAL
  Filled 2022-01-11 (×11): qty 3

## 2022-01-11 NOTE — Group Note (Signed)
[facility] LCSW Group Therapy Note ? ? ?Group Date: 01/11/2022 ?Start Time: 1300 ?End Time: 1400 ? ? ?Type of Therapy/Topic:  Group Therapy:  Balance in Life ? ?Participation Level:  Did Not Attend  ? ?Description of Group:   ? This group will address the concept of balance and how it feels and looks when one is unbalanced. Patients will be encouraged to process areas in their lives that are out of balance, and identify reasons for remaining unbalanced. Facilitators will guide patients utilizing problem- solving interventions to address and correct the stressor making their life unbalanced. Understanding and applying boundaries will be explored and addressed for obtaining  and maintaining a balanced life. Patients will be encouraged to explore ways to assertively make their unbalanced needs known to significant others in their lives, using other group members and facilitator for support and feedback. ? ?Therapeutic Goals: ?Patient will identify two or more emotions or situations they have that consume much of in their lives. ?Patient will identify signs/triggers that life has become out of balance:  ?Patient will identify two ways to set boundaries in order to achieve balance in their lives:  ?Patient will demonstrate ability to communicate their needs through discussion and/or role plays ? ?Summary of Patient Progress: Due to limited staffing, group was not held on the unit.  ? ? ? ? ? ?Therapeutic Modalities:   ?Cognitive Behavioral Therapy ?Solution-Focused Therapy ?Assertiveness Training ? ? ?Aleicia Kenagy K Pearly Apachito, LCSWA ?

## 2022-01-11 NOTE — Progress Notes (Signed)
[facility] MD Progress Note ? ?01/11/2022 2:40 PM ?Jay Webster  ?MRN:  [id number] ?Subjective: Follow-up for this 88 year old man with what appears to really be a very severe depression.  He was able to spend a little more time talking with me today and told me that it was really just in the last couple months that he pretty abruptly started to feel so terribly hopeless and depressed.  He says now he just wishes that he would die all the time.  He is somewhat rambling but does not seem to be obviously demented.  Seems to still have concern for some people he knows but at the same time says he just does not have any energy or motivation to do anything.  He has been spending almost all of his time in bed.  Vitals unremarkable. ?Principal Problem: Major depressive disorder, recurrent episode, severe ([place]) ?Diagnosis: Principal Problem: ?  Major depressive disorder, recurrent episode, severe ([place]) ?Active Problems: ?  Cognitive and behavioral changes ? ?Total Time spent with patient: 30 minutes ? ?Past Psychiatric History: Minimal past history before current episode ? ?Past Medical History:  ?Past Medical History:  ?Diagnosis Date  ? Arthritis   ? OA AND PAIN LEFT HIP AND OTHER JOINT PAINS  ? Cancer [facility])   ? THYROID CANCER - HAD THYROID REMOVED  ? History of kidney stones   ? History of shingles   ? RESOLVED  ? Hypertension   ? Hypothyroidism   ? Low back pain   ? Macular degeneration of both eyes   ? RBBB (right bundle branch block with left anterior fascicular block)   ? Seasonal allergies   ? Thyroid disease   ? Uric acid renal calculus 06/20/2007  ? Qualifier: Diagnosis of  By: Rogue Bussing CMA, Jacqualynn    ?  ?Past Surgical History:  ?Procedure Laterality Date  ? APPENDECTOMY  1948  ? CATARACT EXTRACTION Right 08/27/2019  ? COLONOSCOPY    ? Elbow Radical Reduction  1973  ? IR IMAGING GUIDED PORT INSERTION  05/05/2018  ? IR REMOVAL TUN ACCESS W/ PORT W/O FL MOD SED  10/18/2018  ? [place]  ? MASS  EXCISION N/A 03/11/2018  ? Procedure: EXCISION MASS OF PERINEUM;  Surgeon: Armandina Gemma, MD;  Location: [facility];  Service: General;  Laterality: N/A;  ? REMOVAL OF FIRST RIB   [place]  ? FOR COMPRESSED VEIN BETWEEN 1 ST RIB AND COLLARBONE  ? Rib removed, 1st  1994  ? ROTATOR CUFF REPAIR  2009  ? THYROIDECTOMY  2010  ? TOTAL HIP ARTHROPLASTY Left 05/31/2013  ? Procedure: LEFT TOTAL HIP ARTHROPLASTY ANTERIOR APPROACH;  Surgeon: Gearlean Alf, MD;  [facility];  Service: Orthopedics;  Laterality: Left;  ? ?Family History:  ?Family History  ?Problem Relation Age of Onset  ? Heart disease Mother   ?     passed in 40s  ? Diabetes Mother   ? Alcohol abuse Father   ?     passed from this  ? Parkinson's disease Sister   ? Diabetes Brother   ? Dementia Sister   ? Cancer Brother   ?     sounds like bone cancer- non healing knee injury  ? ?Family Psychiatric  History: See previous ?Social History:  ?Social History  ? ?Substance and Sexual Activity  ?Alcohol Use Not Currently  ? Alcohol/week: 0.0 - 1.0 standard drinks  ?   ?Social History  ? ?Substance and Sexual Activity  ?  Drug Use No  ?  ?Social History  ? ?Socioeconomic History  ? Marital status: Widowed  ?  Spouse name: Not on file  ? Number of children: Not on file  ? Years of education: Not on file  ? Highest education level: Not on file  ?Occupational History  ? Occupation: retired  ?Tobacco Use  ? Smoking status: Former  ?  Types: Cigarettes  ?  Quit date: 02/22/1979  ?  Years since quitting: 42.9  ? Smokeless tobacco: Never  ?Vaping Use  ? Vaping Use: Never used  ?Substance and Sexual Activity  ? Alcohol use: Not Currently  ?  Alcohol/week: 0.0 - 1.0 standard drinks  ? Drug use: No  ? Sexual activity: Not Currently  ?Other Topics Concern  ? Not on file  ?Social History Narrative  ? Lives alone and 1 cat. Divorced from mongomous long term partner and eventual husband. No children from prior relationships.   ?   ? Retired Investment banker, corporate  ?   ? Hobbies:  socializing, used to play tennis  ? ?Social Determinants of Health  ? ?Financial Resource Strain: Not on file  ?Food Insecurity: Not on file  ?Transportation Needs: Not on file  ?Physical Activity: Not on file  ?Stress: Not on file  ?Social Connections: Not on file  ? ?Additional Social History:  ?  ?  ?  ?  ?  ?  ?  ?  ?  ?  ?  ? ?Sleep: Poor ? ?Appetite:  Poor ? ?Current Medications: ?Current Facility-Administered Medications  ?Medication Dose Route Frequency Provider Last Rate Last Admin  ? alum & mag hydroxide-simeth (MAALOX/MYLANTA) 200-200-20 MG/5ML suspension 30 mL  30 mL Oral Q4H PRN Parks Ranger, DO      ? donepezil (ARICEPT) tablet 5 mg  5 mg Oral QHS Parks Ranger, DO   5 mg at 01/10/22 2120  ? haloperidol (HALDOL) tablet 0.5 mg  0.5 mg Oral BH-q8a4p Parks Ranger, DO   0.5 mg at 01/11/22 1308  ? levothyroxine (SYNTHROID) tablet 125 mcg  125 mcg Oral Daily Parks Ranger, [place]   125 mcg at 01/11/22 6578  ? LORazepam (ATIVAN) tablet 0.5 mg  0.5 mg Oral Q4H PRN Parks Ranger, DO      ? magnesium hydroxide (MILK OF MAGNESIA) suspension 30 mL  30 mL Oral Daily PRN Parks Ranger, DO      ? mirtazapine (REMERON) tablet 45 mg  45 mg Oral QHS [place] Andreoni T, MD      ? OLANZapine (ZYPREXA) tablet 5 mg  5 mg Oral Q6H PRN Parks Ranger, DO      ? traZODone (DESYREL) tablet 100 mg  100 mg Oral QHS PRN Parks Ranger, DO   100 mg at 01/10/22 2120  ? ? ?Lab Results:  ?Results for orders placed or performed during the hospital encounter of 01/07/22 (from the past 48 hour(s))  ?Lipid panel     Status: None  ? Collection Time: 01/10/22  6:24 PM  ?Result Value Ref Range  ? Cholesterol 146 0 - 200 mg/dL  ? Triglycerides 52 <150 mg/dL  ? HDL 62 >40 mg/dL  ? Total CHOL/HDL Ratio 2.4 RATIO  ? VLDL 10 0 - 40 mg/dL  ? LDL Cholesterol 74 0 - 99 mg/dL  ?  Comment:        ?Total Cholesterol/HDL:CHD Risk ?Coronary Heart Disease Risk Table ?  Men   Women ? 1/2 Average Risk   3.4   3.3 ? Average Risk       5.0   4.4 ? 2 X Average Risk   9.6   7.1 ? 3 X Average Risk  23.4   11.0 ?       ?Use the calculated Patient Ratio ?above and the CHD Risk Table ?to determine the patient's CHD Risk. ?       ?ATP III CLASSIFICATION (LDL): ? <100     mg/dL   Optimal ? 100-129  mg/dL   Near or Above ?                   Optimal ? 130-159  mg/dL   Borderline ? 160-189  mg/dL   High ? >190     mg/dL   Very High ?Performed at [facility], [address], [place], [place] [address] ?  ?Hemoglobin A1c     Status: None  ? Collection Time: 01/10/22  6:24 PM  ?Result Value Ref Range  ? Hgb A1c MFr Bld 5.6 4.8 - 5.6 %  ?  Comment: (NOTE) ?Pre diabetes:          5.7%-6.4% ? ?Diabetes:              >6.4% ? ?Glycemic control for   <7.0% ?adults with diabetes ?  ? Mean Plasma Glucose 114.02 mg/dL  ?  Comment: Performed at [facility], [place] [address] [place], [place] [address]  ? ? ?Blood Alcohol level:  ?Lab Results  ?Component Value Date  ? ETH <10 01/05/2022  ? ? ?Metabolic Disorder Labs: ?Lab Results  ?Component Value Date  ? HGBA1C 5.6 01/10/2022  ? MPG 114.02 01/10/2022  ? MPG 117 08/20/2020  ? ?No results found for: PROLACTIN ?Lab Results  ?Component Value Date  ? CHOL 146 01/10/2022  ? TRIG 52 01/10/2022  ? HDL 62 01/10/2022  ? CHOLHDL 2.4 01/10/2022  ? VLDL 10 01/10/2022  ? [place] 74 01/10/2022  ? [place] 69 02/19/2020  ? ? ?Physical Findings: ?AIMS: Facial and Oral Movements ?Muscles of Facial Expression: None, normal ?Lips and Perioral Area: None, normal ?Jaw: None, normal ?Tongue: None, normal,Extremity Movements ?Upper (arms, wrists, hands, fingers): None, normal ?Lower (legs, knees, ankles, toes): None, normal, Trunk Movements ?Neck, shoulders, hips: None, normal, Overall Severity ?Severity of abnormal movements (highest score from questions above): None, normal ?Incapacitation due to abnormal movements: None, normal ?Patient's awareness of abnormal  movements (rate only patient's report): No Awareness, Dental Status ?Current problems with teeth and/or dentures?: No ?Does patient usually wear dentures?: No  ?CIWA:    ?COWS:    ? ?Musculoskeletal: ?Strength &

## 2022-01-11 NOTE — Progress Notes (Signed)
Patient is observed in his bedroom at the beginning of the shift. He is sullen and states that he is "no good." Pt voices negative attitude towards himself. He denies SI, HI, and AVH. He denies anxiety and depression as well. He is only oriented to himself. He is safe on the unit at this time. Q 15 minute safety checks in place.  ?

## 2022-01-11 NOTE — Progress Notes (Signed)
Patient denies SI, HI, and AVH. Patient is calm and cooperative throughout the morning. He had an episode of incontinence of urine this morning. Patient was changed and showered. Patient is compliant with scheduled medication. Appetite and sleep is good. Patient remains safe on the unit at this time. ?

## 2022-01-12 DIAGNOSIS — R4189 Other symptoms and signs involving cognitive functions and awareness: Secondary | ICD-10-CM | POA: Diagnosis not present

## 2022-01-12 DIAGNOSIS — R4689 Other symptoms and signs involving appearance and behavior: Secondary | ICD-10-CM | POA: Diagnosis not present

## 2022-01-12 MED ORDER — BUPROPION HCL ER (XL) 150 MG PO TB24
150.0000 mg | ORAL_TABLET | Freq: Every day | ORAL | Status: DC
  Administered 2022-01-13 – 2022-01-30 (×18): 150 mg via ORAL
  Filled 2022-01-12 (×18): qty 1

## 2022-01-12 NOTE — Plan of Care (Signed)
Patient Alert to self.  Pt denies SI/HI Avh but not sure of comprehension.  Pt presents Anxious and states reason for admission as "Thanks to this wonderful new virtual Reality Crap"  Patient did have several incontinent episodes during shift.  Pt did comply with scheduled medications. Pt denies any adverse reactions to medications "In order to complete the cycle it is necessary". Q 15 minute safety rounding in place.  Will continue plan of care.   ?Problem: Education: ?Goal: Knowledge of [place] [facility] Education information/materials will improve ?Outcome: Not Progressing ?Goal: Emotional status will improve ?Outcome: Not Progressing ?Goal: Mental status will improve ?Outcome: Not Progressing ?  ?

## 2022-01-12 NOTE — Progress Notes (Signed)
[facility] MD Progress Note ? ?01/12/2022 1:50 PM ?Jay Webster  ?MRN:  [id number] ?Subjective: Jay Webster is seen on rounds today.  He is still very much depressed and isolative to his room.  He is in bed a lot.  He does not really have any psychiatric history but has recently become more confused and depressed.  He is taking his medications as prescribed denies any side effects.  I think he could benefit from something like Wellbutrin or even Provigil. ? ?Principal Problem: Major depressive disorder, recurrent episode, severe ([place]) ?Diagnosis: Principal Problem: ?  Major depressive disorder, recurrent episode, severe ([place]) ?Active Problems: ?  Cognitive and behavioral changes ? ?Total Time spent with patient: 15 minutes ? ?Past Psychiatric History: None ? ?Past Medical History:  ?Past Medical History:  ?Diagnosis Date  ? Arthritis   ? OA AND PAIN LEFT HIP AND OTHER JOINT PAINS  ? Cancer [facility])   ? THYROID CANCER - HAD THYROID REMOVED  ? History of kidney stones   ? History of shingles   ? RESOLVED  ? Hypertension   ? Hypothyroidism   ? Low back pain   ? Macular degeneration of both eyes   ? RBBB (right bundle branch block with left anterior fascicular block)   ? Seasonal allergies   ? Thyroid disease   ? Uric acid renal calculus 06/20/2007  ? Qualifier: Diagnosis of  By: Rogue Bussing CMA, Jacqualynn    ?  ?Past Surgical History:  ?Procedure Laterality Date  ? APPENDECTOMY  1948  ? CATARACT EXTRACTION Right 08/27/2019  ? COLONOSCOPY    ? Elbow Radical Reduction  1973  ? IR IMAGING GUIDED PORT INSERTION  05/05/2018  ? IR REMOVAL TUN ACCESS W/ PORT W/O FL MOD SED  10/18/2018  ? [place]  ? MASS EXCISION N/A 03/11/2018  ? Procedure: EXCISION MASS OF PERINEUM;  Surgeon: Armandina Gemma, MD;  Location: [facility];  Service: General;  Laterality: N/A;  ? REMOVAL OF FIRST RIB   [place]  ? FOR COMPRESSED VEIN BETWEEN 1 ST RIB AND COLLARBONE  ? Rib removed, 1st  1994  ? ROTATOR CUFF REPAIR  2009  ? THYROIDECTOMY  2010  ?  TOTAL HIP ARTHROPLASTY Left 05/31/2013  ? Procedure: LEFT TOTAL HIP ARTHROPLASTY ANTERIOR APPROACH;  Surgeon: Gearlean Alf, MD;  Location: [facility];  Service: Orthopedics;  Laterality: Left;  ? ?Family History:  ?Family History  ?Problem Relation Age of Onset  ? Heart disease Mother   ?     passed in 76s  ? Diabetes Mother   ? Alcohol abuse Father   ?     passed from this  ? Parkinson's disease Sister   ? Diabetes Brother   ? Dementia Sister   ? Cancer Brother   ?     sounds like bone cancer- non healing knee injury  ? ? ?Social History:  ?Social History  ? ?Substance and Sexual Activity  ?Alcohol Use Not Currently  ? Alcohol/week: 0.0 - 1.0 standard drinks  ?   ?Social History  ? ?Substance and Sexual Activity  ?Drug Use No  ?  ?Social History  ? ?Socioeconomic History  ? Marital status: Widowed  ?  Spouse name: Not on file  ? Number of children: Not on file  ? Years of education: Not on file  ? Highest education level: Not on file  ?Occupational History  ? Occupation: retired  ?Tobacco Use  ? Smoking status: Former  ?  Types: Cigarettes  ?  Quit date: 02/22/1979  ?  Years since quitting: 42.9  ? Smokeless tobacco: Never  ?Vaping Use  ? Vaping Use: Never used  ?Substance and Sexual Activity  ? Alcohol use: Not Currently  ?  Alcohol/week: 0.0 - 1.0 standard drinks  ? Drug use: No  ? Sexual activity: Not Currently  ?Other Topics Concern  ? Not on file  ?Social History Narrative  ? Lives alone and 1 cat. Divorced from mongomous long term partner and eventual husband. No children from prior relationships.   ?   ? Retired Investment banker, corporate  ?   ? Hobbies: socializing, used to play tennis  ? ?Social Determinants of Health  ? ?Financial Resource Strain: Not on file  ?Food Insecurity: Not on file  ?Transportation Needs: Not on file  ?Physical Activity: Not on file  ?Stress: Not on file  ?Social Connections: Not on file  ? ?Additional Social History:  ?  ?  ?  ?  ?  ?  ?  ?  ?  ?  ?  ? ?Sleep: Good ? ?Appetite:   Fair ? ?Current Medications: ?Current Facility-Administered Medications  ?Medication Dose Route Frequency Provider Last Rate Last Admin  ? alum & mag hydroxide-simeth (MAALOX/MYLANTA) 200-200-20 MG/5ML suspension 30 mL  30 mL Oral Q4H PRN Parks Ranger, DO      ? [START ON 01/13/2022] buPROPion (WELLBUTRIN XL) 24 hr tablet 150 mg  150 mg Oral Daily Parks Ranger, DO      ? donepezil (ARICEPT) tablet 5 mg  5 mg Oral QHS Parks Ranger, DO   5 mg at 01/11/22 2205  ? haloperidol (HALDOL) tablet 0.5 mg  0.5 mg Oral [id number] Parks Ranger, DO   0.5 mg at 01/12/22 0846  ? levothyroxine (SYNTHROID) tablet 125 mcg  125 mcg Oral Daily Parks Ranger, [place]   125 mcg at 01/12/22 0645  ? LORazepam (ATIVAN) tablet 0.5 mg  0.5 mg Oral Q4H PRN Parks Ranger, DO      ? magnesium hydroxide (MILK OF MAGNESIA) suspension 30 mL  30 mL Oral Daily PRN Parks Ranger, DO      ? mirtazapine (REMERON) tablet 45 mg  45 mg Oral QHS Clapacs, Madie Reno, MD   45 mg at 01/11/22 2206  ? OLANZapine (ZYPREXA) tablet 5 mg  5 mg Oral Q6H PRN Parks Ranger, DO      ? traZODone (DESYREL) tablet 100 mg  100 mg Oral QHS PRN Parks Ranger, DO   100 mg at 01/11/22 2205  ? ? ?Lab Results:  ?Results for orders placed or performed during the hospital encounter of 01/07/22 (from the past 48 hour(s))  ?Lipid panel     Status: None  ? Collection Time: 01/10/22  6:24 PM  ?Result Value Ref Range  ? Cholesterol 146 0 - 200 mg/dL  ? Triglycerides 52 <150 mg/dL  ? HDL 62 >40 mg/dL  ? Total CHOL/HDL Ratio 2.4 RATIO  ? VLDL 10 0 - 40 mg/dL  ? LDL Cholesterol 74 0 - 99 mg/dL  ?  Comment:        ?Total Cholesterol/HDL:CHD Risk ?Coronary Heart Disease Risk Table ?                    Men   Women ? 1/2 Average Risk   3.4   3.3 ? Average Risk       5.0   4.4 ? 2  X Average Risk   9.6   7.1 ? 3 X Average Risk  23.4   11.0 ?       ?Use the calculated Patient Ratio ?above and the CHD Risk Table ?to  determine the patient's CHD Risk. ?       ?ATP III CLASSIFICATION (LDL): ? <100     mg/dL   Optimal ? 100-129  mg/dL   Near or Above ?                   Optimal ? 130-159  mg/dL   Borderline ? 160-189  mg/dL   High ? >190     mg/dL   Very High ?Performed at [facility], [address] [place], [place] [address] ?  ?Hemoglobin A1c     Status: None  ? Collection Time: 01/10/22  6:24 PM  ?Result Value Ref Range  ? Hgb A1c MFr Bld 5.6 4.8 - 5.6 %  ?  Comment: (NOTE) ?Pre diabetes:          5.7%-6.4% ? ?Diabetes:              >6.4% ? ?Glycemic control for   <7.0% ?adults with diabetes ?  ? Mean Plasma Glucose 114.02 mg/dL  ?  Comment: Performed at [facility], [place] [address] [place], [place] [address]  ? ? ?Blood Alcohol level:  ?Lab Results  ?Component Value Date  ? ETH <10 01/05/2022  ? ? ?Metabolic Disorder Labs: ?Lab Results  ?Component Value Date  ? HGBA1C 5.6 01/10/2022  ? MPG 114.02 01/10/2022  ? MPG 117 08/20/2020  ? ?No results found for: PROLACTIN ?Lab Results  ?Component Value Date  ? CHOL 146 01/10/2022  ? TRIG 52 01/10/2022  ? HDL 62 01/10/2022  ? CHOLHDL 2.4 01/10/2022  ? VLDL 10 01/10/2022  ? [place] 74 01/10/2022  ? [place] 69 02/19/2020  ? ? ?Physical Findings: ?AIMS: Facial and Oral Movements ?Muscles of Facial Expression: None, normal ?Lips and Perioral Area: None, normal ?Jaw: None, normal ?Tongue: None, normal,Extremity Movements ?Upper (arms, wrists, hands, fingers): None, normal ?Lower (legs, knees, ankles, toes): None, normal, Trunk Movements ?Neck, shoulders, hips: None, normal, Overall Severity ?Severity of abnormal movements (highest score from questions above): None, normal ?Incapacitation due to abnormal movements: None, normal ?Patient's awareness of abnormal movements (rate only patient's report): No Awareness, Dental Status ?Current problems with teeth and/or dentures?: No ?Does patient usually wear dentures?: No  ?CIWA:    ?COWS:    ? ?Musculoskeletal: ?Strength &  Muscle Tone: within normal limits ?Gait & Station: normal ?Patient leans: N/A ? ?Psychiatric Specialty Exam: ? ?Presentation  ?General Appearance: No data recorded ?Eye Contact:No data recorded ?Speech:No data recorded

## 2022-01-12 NOTE — Progress Notes (Signed)
Recreation Therapy Notes ? ?Date: 01/12/2022 ?  ?Time: 1:40 pm ?  ?Location: Court yard  ?  ?Behavioral response: N/A ?  ?Intervention Topic: Emotions  ?  ?Discussion/Intervention: ?Patient refused to attend group.  ?  ?Clinical Observations/Feedback:  ?Patient refused to attend group.  ?  ?Lavonda Thal LRT/CTRS ? ? ? ? ? ? ? ?Maxie Slovacek ?01/12/2022 3:12 PM ?

## 2022-01-12 NOTE — Plan of Care (Signed)
Patient remain alert to self with some confusion. Calm and cooperative during assessment. Denies SI, HI, and AVH. Denies depression endorsed anxiety of 3/10. Patient stated "I'm just a little confused, don't know where to go or what to do next." Report sleeping well, denies pain or discomfort at this time. Compliant with all due medications. Ate meals in the day room among peers and tolerated well. Remain safe on the unit with Q15 minute safety checks. ? ?Problem: Education: ?Goal: Knowledge of [facility] Education information/materials will improve ?Outcome: Progressing ?Goal: Emotional status will improve ?Outcome: Progressing ?Goal: Mental status will improve ?Outcome: Progressing ?Goal: Verbalization of understanding the information provided will improve ?Outcome: Progressing ?  ?Problem: Activity: ?Goal: Interest or engagement in activities will improve ?Outcome: Progressing ?Goal: Sleeping patterns will improve ?Outcome: Progressing ?  ?Problem: Coping: ?Goal: Ability to verbalize frustrations and anger appropriately will improve ?Outcome: Progressing ?Goal: Ability to demonstrate self-control will improve ?Outcome: Progressing ?  ?Problem: Health Behavior/Discharge Planning: ?Goal: Identification of resources available to assist in meeting health care needs will improve ?Outcome: Progressing ?Goal: Compliance with treatment plan for underlying cause of condition will improve ?Outcome: Progressing ?  ?Problem: Physical Regulation: ?Goal: Ability to maintain clinical measurements within normal limits will improve ?Outcome: Progressing ?  ?Problem: Safety: ?Goal: Periods of time without injury will increase ?Outcome: Progressing ?  ?Problem: Education: ?Goal: Ability to make informed decisions regarding treatment will improve ?Outcome: Progressing ?  ?Problem: Health Behavior/Discharge Planning: ?Goal: Identification of resources available to assist in meeting health care needs will improve ?Outcome:  Progressing ?  ?Problem: Medication: ?Goal: Compliance with prescribed medication regimen will improve ?Outcome: Progressing ?  ?Problem: Self-Concept: ?Goal: Will verbalize positive feelings about self ?Outcome: Progressing ?  ?Problem: Activity: ?Goal: Will identify at least one activity in which they can participate ?Outcome: Progressing ?  ?Problem: Coping: ?Goal: Ability to identify and develop effective coping behavior will improve ?Outcome: Progressing ?Goal: Ability to interact with others will improve ?Outcome: Progressing ?Goal: Demonstration of participation in decision-making regarding own care will improve ?Outcome: Progressing ?Goal: Ability to use eye contact when communicating with others will improve ?Outcome: Progressing ?  ?Problem: Health Behavior/Discharge Planning: ?Goal: Identification of resources available to assist in meeting health care needs will improve ?Outcome: Progressing ?  ?Problem: Self-Concept: ?Goal: Will verbalize positive feelings about self ?Outcome: Progressing ?  ?

## 2022-01-13 DIAGNOSIS — R4189 Other symptoms and signs involving cognitive functions and awareness: Secondary | ICD-10-CM | POA: Diagnosis not present

## 2022-01-13 DIAGNOSIS — R4689 Other symptoms and signs involving appearance and behavior: Secondary | ICD-10-CM | POA: Diagnosis not present

## 2022-01-13 NOTE — Progress Notes (Signed)
Patient remain on 1:1 safety observation with sitter with reach. Patient is currently in the day room eating lunch among peers in no apparent distress. Will continue to monitor. ?

## 2022-01-13 NOTE — Progress Notes (Signed)
Patient remain alert and oriented, calm and cooperative at this time. Currently in the day room among peers eating breakfast with 1:1 sitter within reach. ?

## 2022-01-13 NOTE — Group Note (Signed)
LCSW Group Therapy Note ? ?Group Date: 01/13/2022 ?Start Time: 1500 ?End Time: 1600 ? ? ?Type of Therapy and Topic:  Group Therapy - How To Cope with Nervousness about Discharge  ? ?Participation Level:  Did Not Attend  ? ?Description of Group ?This process group involved identification of patients' feelings about discharge. Some of them are scheduled to be discharged soon, while others are new admissions, but each of them was asked to share thoughts and feelings surrounding discharge from the hospital. One common theme was that they are excited at the prospect of going home, while another was that many of them are apprehensive about sharing why they were hospitalized. Patients were given the opportunity to discuss these feelings with their peers in preparation for discharge. ? ?Therapeutic Goals ? ?Patient will identify their overall feelings about pending discharge. ?Patient will think about how they might proactively address issues that they believe will once again arise once they get home (i.e. with parents). ?Patients will participate in discussion about having hope for change. ? ? ?Summary of Patient Progress:  ? ?Patient did not attend group despite encouraged participation.  ? ? ?Therapeutic Modalities ?Cognitive Behavioral Therapy ? ? ?Durenda Hurt, LCSWA ?01/13/2022  4:13 PM   ?

## 2022-01-13 NOTE — Plan of Care (Signed)
Patient remain alert and confused and forgetful. Calm and cooperative during assessment. Denies SI,HI, and AVH. Denies anxiety and depression. Remain weak and unsteady. Ate meals in the day room among peers and tolerated well. Compliant with due medications. Toileted times 2. Patient remain with poor safety awareness. Remain safe on the unit with 1:1 sitter within reach. Q 15 minute safety checks continue. ?Problem: Education: ?Goal: Knowledge of [facility] Education information/materials will improve ?Outcome: Progressing ?Goal: Emotional status will improve ?Outcome: Progressing ?Goal: Mental status will improve ?Outcome: Progressing ?Goal: Verbalization of understanding the information provided will improve ?Outcome: Progressing ?  ?Problem: Activity: ?Goal: Interest or engagement in activities will improve ?Outcome: Progressing ?Goal: Sleeping patterns will improve ?Outcome: Progressing ?  ?Problem: Coping: ?Goal: Ability to verbalize frustrations and anger appropriately will improve ?Outcome: Progressing ?Goal: Ability to demonstrate self-control will improve ?Outcome: Progressing ?  ?Problem: Health Behavior/Discharge Planning: ?Goal: Identification of resources available to assist in meeting health care needs will improve ?Outcome: Progressing ?Goal: Compliance with treatment plan for underlying cause of condition will improve ?Outcome: Progressing ?  ?Problem: Physical Regulation: ?Goal: Ability to maintain clinical measurements within normal limits will improve ?Outcome: Progressing ?  ?Problem: Safety: ?Goal: Periods of time without injury will increase ?Outcome: Progressing ?  ?Problem: Education: ?Goal: Ability to make informed decisions regarding treatment will improve ?Outcome: Progressing ?  ?Problem: Health Behavior/Discharge Planning: ?Goal: Identification of resources available to assist in meeting health care needs will improve ?Outcome: Progressing ?  ?Problem: Medication: ?Goal:  Compliance with prescribed medication regimen will improve ?Outcome: Progressing ?  ?Problem: Self-Concept: ?Goal: Will verbalize positive feelings about self ?Outcome: Progressing ?  ?Problem: Activity: ?Goal: Will identify at least one activity in which they can participate ?Outcome: Progressing ?  ?Problem: Coping: ?Goal: Ability to identify and develop effective coping behavior will improve ?Outcome: Progressing ?Goal: Ability to interact with others will improve ?Outcome: Progressing ?Goal: Demonstration of participation in decision-making regarding own care will improve ?Outcome: Progressing ?Goal: Ability to use eye contact when communicating with others will improve ?Outcome: Progressing ?  ?Problem: Health Behavior/Discharge Planning: ?Goal: Identification of resources available to assist in meeting health care needs will improve ?Outcome: Progressing ?  ?Problem: Self-Concept: ?Goal: Will verbalize positive feelings about self ?Outcome: Progressing ?  ?

## 2022-01-13 NOTE — Progress Notes (Signed)
Tech informed this nurse that pt. Was found on the floor.  Pt observed sitting on the the floor back resting against bed frame with bedding wrapped around him. Assistive device at bedside and non slip foot wear in place. Pt is aggressive with staff insisting we leave the room.  This nurse did redirect pt and encourage pt to allow staff to assist him.  Pt did explain "I was trying to wee wee and my legs slipped from up under me.  Pt assisted to standing position by tech and this nurse.  Pt denies hitting head and denies pain.  This nurse did assess head and no abnormalities noted.  Pt did have superficial skin tear 2cm X 3cm to left distal outer forearm with sanguineous drainage.  Wound cleansed with normal saline and mepilex bandage applied.  AC/H.Sup, On Site APP and Emergency Contact Notified.  Pt currently stable.  ?

## 2022-01-13 NOTE — Plan of Care (Signed)
Pt had minimal outburts of aggression during shift.  Pt presents Flat and melancholy.  Pt denies Si/HI/Avh and contracts for safety.  Pt did comply with scheduled medications. Pt refuse night snack.  Pt had minimal interaction with peers on unit.  Q 15 minute safety checks in place. Will continue plan of care.   ? ? ?Problem: Education: ?Goal: Emotional status will improve ?Outcome: Progressing ?Goal: Mental status will improve ?Outcome: Progressing ?  ?Problem: Activity: ?Goal: Interest or engagement in activities will improve ?Outcome: Progressing ?  ?

## 2022-01-13 NOTE — Progress Notes (Signed)
[facility] MD Progress Note ? ?01/13/2022 11:04 AM ?Jay Webster  ?MRN:  [id number] ?Subjective: Jay Webster apparently had a fall after going to the bathroom last night.  [name] which has been a problem.  Nurses placed him on a one-to-one.  Obviously, he cannot live on his own.  I am going to consult PT and OT for baseline and recommendations.  He is very hard of hearing which is making it more difficult to communicate his needs and how he is doing. ? ?Principal Problem: Major depressive disorder, recurrent episode, severe ([place]) ?Diagnosis: Principal Problem: ?  Major depressive disorder, recurrent episode, severe ([place]) ?Active Problems: ?  Cognitive and behavioral changes ? ?Total Time spent with patient: 15 minutes ? ?Past Psychiatric History: None ? ?Past Medical History:  ?Past Medical History:  ?Diagnosis Date  ? Arthritis   ? OA AND PAIN LEFT HIP AND OTHER JOINT PAINS  ? Cancer [facility])   ? THYROID CANCER - HAD THYROID REMOVED  ? History of kidney stones   ? History of shingles   ? RESOLVED  ? Hypertension   ? Hypothyroidism   ? Low back pain   ? Macular degeneration of both eyes   ? RBBB (right bundle branch block with left anterior fascicular block)   ? Seasonal allergies   ? Thyroid disease   ? Uric acid renal calculus 06/20/2007  ? Qualifier: Diagnosis of  By: Rogue Bussing CMA, Jacqualynn    ?  ?Past Surgical History:  ?Procedure Laterality Date  ? APPENDECTOMY  1948  ? CATARACT EXTRACTION Right 08/27/2019  ? COLONOSCOPY    ? Elbow Radical Reduction  1973  ? IR IMAGING GUIDED PORT INSERTION  05/05/2018  ? IR REMOVAL TUN ACCESS W/ PORT W/O FL MOD SED  10/18/2018  ? [place]  ? MASS EXCISION N/A 03/11/2018  ? Procedure: EXCISION MASS OF PERINEUM;  Surgeon: Armandina Gemma, MD;  Location: [facility];  Service: General;  Laterality: N/A;  ? REMOVAL OF FIRST RIB   [place]  ? FOR COMPRESSED VEIN BETWEEN 1 ST RIB AND COLLARBONE  ? Rib removed, 1st  1994  ? ROTATOR CUFF REPAIR   2009  ? THYROIDECTOMY  2010  ? TOTAL HIP ARTHROPLASTY Left 05/31/2013  ? Procedure: LEFT TOTAL HIP ARTHROPLASTY ANTERIOR APPROACH;  Surgeon: Gearlean Alf, MD;  Location: [facility];  Service: Orthopedics;  Laterality: Left;  ? ?Family History:  ?Family History  ?Problem Relation Age of Onset  ? Heart disease Mother   ?     passed in 52s  ? Diabetes Mother   ? Alcohol abuse Father   ?     passed from this  ? Parkinson's disease Sister   ? Diabetes Brother   ? Dementia Sister   ? Cancer Brother   ?     sounds like bone cancer- non healing knee injury  ? ? ?Social History:  ?Social History  ? ?Substance and Sexual Activity  ?Alcohol Use Not Currently  ? Alcohol/week: 0.0 - 1.0 standard drinks  ?   ?Social History  ? ?Substance and Sexual Activity  ?Drug Use No  ?  ?Social History  ? ?Socioeconomic History  ? Marital status: Widowed  ?  Spouse name: Not on file  ? Number of children: Not on file  ? Years of education: Not on file  ? Highest education level: Not on file  ?Occupational History  ?  Occupation: retired  ?Tobacco Use  ? Smoking status: Former  ?  Types: Cigarettes  ?  Quit date: 02/22/1979  ?  Years since quitting: 42.9  ? Smokeless tobacco: Never  ?Vaping Use  ? Vaping Use: Never used  ?Substance and Sexual Activity  ? Alcohol use: Not Currently  ?  Alcohol/week: 0.0 - 1.0 standard drinks  ? Drug use: No  ? Sexual activity: Not Currently  ?Other Topics Concern  ? Not on file  ?Social History Narrative  ? Lives alone and 1 cat. Divorced from mongomous long term partner and eventual husband. No children from prior relationships.   ?   ? Retired Investment banker, corporate  ?   ? Hobbies: socializing, used to play tennis  ? ?Social Determinants of Health  ? ?Financial Resource Strain: Not on file  ?Food Insecurity: Not on file  ?Transportation Needs: Not on file  ?Physical Activity: Not on file  ?Stress: Not on file  ?Social Connections: Not on file  ? ?Additional Social History:  ?  ?  ?  ?  ?  ?  ?  ?  ?  ?  ?   ? ?Sleep: Fair ? ?Appetite:  Fair ? ?Current Medications: ?Current Facility-Administered Medications  ?Medication Dose Route Frequency Provider Last Rate Last Admin  ? alum & mag hydroxide-simeth (MAALOX/MYLANTA) 200-200-20 MG/5ML suspension 30 mL  30 mL Oral Q4H PRN Parks Ranger, DO      ? buPROPion (WELLBUTRIN XL) 24 hr tablet 150 mg  150 mg Oral Daily Parks Ranger, DO   150 mg at 01/13/22 1048  ? donepezil (ARICEPT) tablet 5 mg  5 mg Oral QHS Parks Ranger, DO   5 mg at 01/12/22 2119  ? haloperidol (HALDOL) tablet 0.5 mg  0.5 mg Oral [id number] Parks Ranger, DO   0.5 mg at 01/13/22 6283  ? levothyroxine (SYNTHROID) tablet 125 mcg  125 mcg Oral Daily Parks Ranger, [place]   125 mcg at 01/13/22 1517  ? LORazepam (ATIVAN) tablet 0.5 mg  0.5 mg Oral Q4H PRN Parks Ranger, DO      ? magnesium hydroxide (MILK OF MAGNESIA) suspension 30 mL  30 mL Oral Daily PRN Parks Ranger, DO      ? mirtazapine (REMERON) tablet 45 mg  45 mg Oral QHS Clapacs, Madie Reno, MD   45 mg at 01/12/22 2118  ? OLANZapine (ZYPREXA) tablet 5 mg  5 mg Oral Q6H PRN Parks Ranger, DO      ? traZODone (DESYREL) tablet 100 mg  100 mg Oral QHS PRN Parks Ranger, DO   100 mg at 01/12/22 2119  ? ? ?Lab Results: No results found for this or any previous visit (from the past 48 hour(s)). ? ?Blood Alcohol level:  ?Lab Results  ?Component Value Date  ? ETH <10 01/05/2022  ? ? ?Metabolic Disorder Labs: ?Lab Results  ?Component Value Date  ? HGBA1C 5.6 01/10/2022  ? MPG 114.02 01/10/2022  ? MPG 117 08/20/2020  ? ?No results found for: PROLACTIN ?Lab Results  ?Component Value Date  ? CHOL 146 01/10/2022  ? TRIG 52 01/10/2022  ? HDL 62 01/10/2022  ? CHOLHDL 2.4 01/10/2022  ? VLDL 10 01/10/2022  ? [place] 74 01/10/2022  ? [place] 69 02/19/2020  ? ? ?Physical Findings: ?AIMS: Facial and Oral Movements ?Muscles of Facial Expression: None, normal ?Lips and Perioral Area: None,  normal ?Jaw: None, normal ?Tongue: None, normal,Extremity Movements ?Upper (arms, wrists, hands,  fingers): None, normal ?Lower (legs, knees, ankles, toes): None, normal, Trunk Movements ?Neck, shoulders, hips: None, normal, Overall Severity ?Severity of abnormal movements (highest score from questions above): None, normal ?Incapacitation due to abnormal movements: None, normal ?Patient's awareness of abnormal movements (rate only patient's report): No Awareness, Dental Status ?Current problems with teeth and/or dentures?: No ?Does patient usually wear dentures?: No  ?CIWA:    ?COWS:    ? ?Musculoskeletal: ?Strength & Muscle Tone: decreased ?Gait & Station: unsteady ?Patient leans: N/A ? ?Psychiatric Specialty Exam: ? ?Presentation  ?General Appearance: No data recorded ?Eye Contact:No data recorded ?Speech:No data recorded ?Speech Volume:No data recorded ?Handedness:No data recorded ? ?Mood and Affect  ?Mood:No data recorded ?Affect:No data recorded ? ?Thought Process  ?Thought Processes:No data recorded ?Descriptions of Associations:No data recorded ?Orientation:No data recorded ?Thought Content:No data recorded ?History of Schizophrenia/Schizoaffective disorder:No data recorded ?Duration of Psychotic Symptoms:No data recorded ?Hallucinations:No data recorded ?Ideas of Reference:No data recorded ?Suicidal Thoughts:No data recorded ?Homicidal Thoughts:No data recorded ? ?Sensorium  ?Memory:No data recorded ?Judgment:No data recorded ?Insight:No data recorded ? ?Executive Functions  ?Concentration:No data recorded ?Attention Span:No data recorded ?Recall:No data recorded ?[employer] [place] recorded ?Language:No data recorded ? ?Psychomotor Activity  ?Psychomotor Activity:No data recorded ? ?Assets  ?Assets:No data recorded ? ?Sleep  ?Sleep:No data recorded ? ? ?Physical Exam: ?Physical Exam ?Vitals and nursing note reviewed.  ?Constitutional:   ?   Appearance: Normal appearance. He is normal weight.   ?Neurological:  ?   General: No focal deficit present.  ?   Mental Status: He is alert and oriented to person, place, and time.  ?Psychiatric:     ?   Attention and Perception: Attention and perception normal.     ?   [place]

## 2022-01-13 NOTE — Evaluation (Signed)
Physical Therapy Evaluation ?Patient Details ?Name: Jay Webster ?MRN: [id number] ?DOB: Aug 18, 1932 ?Today's Date: 01/13/2022 ? ?History of Present Illness ? Pt is an 88 yo male that presented to the ED for complaints of Psychiatric Evaluation and IVC. PMH of HTN, low back pain, HOH, macular degeneration of both eyes, thyroid disease, removal of first rib, L THA. ?  ?Clinical Impression ? Patient sleeping but wakes to touch and voice upon PT entrance. Pt oriented to self only, reported "[place]" for location and "Care Facility" and that the date was September 2022. He reported that he does live alone, did not speak about his niece or neighbors providing assist (per chart review). He did endorse a few falls "over the summer", and that he's using a RW while he walking around. ? ?The patient was able to perform bed mobility and transfers modI. Seated LE strength at least 4+/5, some difficulty with sequencing commands noted. He ambulated ~54f total with and without the RW, but improved confidence and gait quality noted with RW, pt verbalized agreement to use. The patient did have difficulty reading signs on the wall as well as locating his room, and some safety cues as well as cues to attend the situation. Overall the patients mobility is functional, recommendation is LTC without follow up therapy for safety concerns of pt living alone. PT to follow pt here to attempt further education safety and DME management.    ?   ? ?Recommendations for follow up therapy are one component of a multi-disciplinary discharge planning process, led by the attending physician.  Recommendations may be updated based on patient status, additional functional criteria and insurance authorization. ? ?Follow Up Recommendations Long-term institutional care without follow-up therapy ? ?  ?Assistance Recommended at Discharge Frequent or constant Supervision/Assistance  ?Patient can return home with the following ? Assistance with  cooking/housework;Direct supervision/assist for financial management;Assist for transportation;Help with stairs or ramp for entrance;Direct supervision/assist for medications management ? ?  ?Equipment Recommendations Rolling walker (2 wheels)  ?Recommendations for Other Services ?    ?  ?Functional Status Assessment Patient has not had a recent decline in their functional status  ? ?  ?Precautions / Restrictions Precautions ?Precautions: Fall ?Restrictions ?Weight Bearing Restrictions: No  ? ?  ? ?Mobility ? Bed Mobility ?Overal bed mobility: Modified Independent ?  ?  ?  ?  ?  ?  ?  ?  ? ?Transfers ?Overall transfer level: Modified independent ?Equipment used: Rolling walker (2 wheels) ?  ?  ?  ?  ?  ?  ?  ?General transfer comment: pt pulls on RW with both hands, but no unsteadiness noted, educated on proper hand placement ?  ? ?Ambulation/Gait ?  ?Gait Distance (Feet): 60 Feet ?Assistive device: Rolling walker (2 wheels), None ?  ?Gait velocity: decreased ?  ?  ?General Gait Details: ambulated 669ftotal, trialed without RW but pt slower with more gait deviations, and less confident than with RW ? ?Stairs ?  ?  ?  ?  ?  ? ?Wheelchair Mobility ?  ? ?Modified Rankin (Stroke Patients Only) ?  ? ?  ? ?Balance Overall balance assessment: Needs assistance ?Sitting-balance support: Feet supported ?Sitting balance-Leahy Scale: Good ?  ?  ?  ?Standing balance-Leahy Scale: Good ?  ?  ?  ?  ?  ?  ?  ?  ?  ?  ?  ?  ?   ? ? ? ?Pertinent Vitals/Pain Pain Assessment ?Pain Assessment: No/denies pain  ? ? ?  Home Living Family/patient expects to be discharged to:: Private residence ?Living Arrangements: Alone ?  ?Type of Home: House ?Home Access: Stairs to enter ?Entrance Stairs-Rails: None ?Entrance Stairs-Number of Steps: 4-5 ?  ?Home Layout: One level ?Home Equipment: Conservation officer, nature (2 wheels);Cane - single point ?   ?  ?Prior Function Prior Level of Function : Patient poor historian/Family not available ?  ?  ?  ?  ?  ?   ?Mobility Comments: pt reported he recently started using a walker due to some weakness ?  ?  ? ? ?Hand Dominance  ? Dominant Hand: Right ? ?  ?Extremity/Trunk Assessment  ? Upper Extremity Assessment ?Upper Extremity Assessment: Defer to OT evaluation ?  ? ?Lower Extremity Assessment ?Lower Extremity Assessment: Overall WFL for tasks assessed ?  ? ?Cervical / Trunk Assessment ?Cervical / Trunk Assessment: Normal  ?Communication  ? Communication: HOH  ?Cognition Arousal/Alertness: Awake/alert ?Behavior During Therapy: [facility] for tasks assessed/performed ?Overall Cognitive Status: No family/caregiver present to determine baseline cognitive functioning ?  ?  ?  ?  ?  ?  ?  ?  ?  ?  ?  ?  ?  ?  ?  ?  ?General Comments: pt oriented to self only. reported he was at a "care facility" in [place], thought it was september 2022 ?  ?  ? ?  ?General Comments   ? ?  ?Exercises    ? ?Assessment/Plan  ?  ?PT Assessment Patient needs continued PT services  ?PT Problem List Decreased balance;Decreased knowledge of use of DME;Decreased mobility ? ?   ?  ?PT Treatment Interventions DME instruction;Gait training;Functional mobility training;Patient/family education;Neuromuscular re-education;Balance training   ? ?PT Goals (Current goals can be found in the Care Plan section)  ?Acute Rehab PT Goals ?PT Goal Formulation: Patient unable to participate in goal setting ?Time For Goal Achievement: 01/27/22 ?Potential to Achieve Goals: Good ? ?  ?Frequency Min 2X/week ?  ? ? ?Co-evaluation   ?  ?  ?  ?  ? ? ?  ?AM-PAC PT "6 Clicks" Mobility  ?Outcome Measure Help needed turning from your back to your side while in a flat bed without using bedrails?: None ?Help needed moving from lying on your back to sitting on the side of a flat bed without using bedrails?: None ?Help needed moving to and from a bed to a chair (including a wheelchair)?: None ?Help needed standing up from a chair using your arms (e.g., wheelchair or bedside chair)?:  None ?Help needed to walk in hospital room?: None ?Help needed climbing 3-5 steps with a railing? : A Little ?6 Click Score: 23 ? ?  ?End of Session Equipment Utilized During Treatment: Gait belt ?Activity Tolerance: Patient tolerated treatment well ?Patient left: in bed;with nursing/sitter in room ?Nurse Communication: Mobility status ?PT Visit Diagnosis: Other abnormalities of gait and mobility (R26.89) ?  ? ?Time: 1610-9604 ?PT Time Calculation (min) (ACUTE ONLY): 15 min ? ? ?Charges:   PT Evaluation ?$PT Eval Low Complexity: 1 Low ?PT Treatments ?$Therapeutic Activity: 8-22 mins ?  ?   ? ? ?Lieutenant Diego PT, DPT ?3:41 PM,01/13/22 ? ? ?

## 2022-01-13 NOTE — Progress Notes (Addendum)
Recreation Therapy Notes ? ?Date: 01/13/2022 ?  ?Time: 1:25 pm ?  ?Location: Craft room ?  ?Behavioral response: N/A ?  ?Intervention Topic: Wellness  ?  ?Discussion/Intervention: ?Patient refused to attend group.  ?  ?Clinical Observations/Feedback:  ?Patient refused to attend group.  ?  ?Guerin Lashomb LRT/CTRS ?  ? ? ? ? ? ? ? ?Tamisha Nordstrom ?01/13/2022 2:24 PM ?

## 2022-01-13 NOTE — Progress Notes (Signed)
Status post fall patient remain alert and confused, calm and cooperative. Skin tear to left arm remain with clean dry dressing. Denies pain or discomfort at this time. Currently in bed resting with 1:1 sitter at bed side. Q 15 minute safety checks remain in place. ?

## 2022-01-14 ENCOUNTER — Encounter: Payer: Self-pay | Admitting: Psychiatry

## 2022-01-14 DIAGNOSIS — R4689 Other symptoms and signs involving appearance and behavior: Secondary | ICD-10-CM | POA: Diagnosis not present

## 2022-01-14 DIAGNOSIS — R4189 Other symptoms and signs involving cognitive functions and awareness: Secondary | ICD-10-CM | POA: Diagnosis not present

## 2022-01-14 NOTE — Progress Notes (Signed)
Patient remain on 1:1 safety observation with sitter within reach. Remain confused and forgetful. Requires redirection. Toiled X1. Q15 minute safety checks continues. ?

## 2022-01-14 NOTE — Plan of Care (Signed)
?  Problem: Education: ?Goal: Emotional status will improve ?Outcome: Progressing ?Goal: Verbalization of understanding the information provided will improve ?Outcome: Progressing ?  ?Problem: Activity: ?Goal: Sleeping patterns will improve ?Outcome: Progressing ?  ?Problem: Coping: ?Goal: Ability to verbalize frustrations and anger appropriately will improve ?Outcome: Progressing ?  ?Problem: Physical Regulation: ?Goal: Ability to maintain clinical measurements within normal limits will improve ?Outcome: Progressing ?  ?Problem: Safety: ?Goal: Periods of time without injury will increase ?Outcome: Progressing ?  ?

## 2022-01-14 NOTE — Progress Notes (Signed)
Nursing 1:1 note ?D:Pt observed laying in bed with eyes closed. RR even and unlabored. No distress noted. Sitter at bedside ?A: 1:1 observation continues for safety  ?R: Pt remains safe  ?

## 2022-01-14 NOTE — BH IP Treatment Plan (Signed)
Interdisciplinary Treatment and Diagnostic Plan Update ? ?01/14/2022 ?Time of Session: 9:30AM ?Jay Webster ?MRN: [id number] ? ?Principal Diagnosis: Major depressive disorder, recurrent episode, severe ([place]) ? ?Secondary Diagnoses: Principal Problem: ?  Major depressive disorder, recurrent episode, severe ([place]) ?Active Problems: ?  Cognitive and behavioral changes ? ? ?Current Medications:  ?Current Facility-Administered Medications  ?Medication Dose Route Frequency Provider Last Rate Last Admin  ? alum & mag hydroxide-simeth (MAALOX/MYLANTA) 200-200-20 MG/5ML suspension 30 mL  30 mL Oral Q4H PRN Parks Ranger, DO      ? buPROPion (WELLBUTRIN XL) 24 hr tablet 150 mg  150 mg Oral Daily Parks Ranger, DO   150 mg at 01/14/22 5681  ? donepezil (ARICEPT) tablet 5 mg  5 mg Oral QHS Parks Ranger, DO   5 mg at 01/13/22 2154  ? haloperidol (HALDOL) tablet 0.5 mg  0.5 mg Oral [id number] Parks Ranger, DO   0.5 mg at 01/14/22 1749  ? levothyroxine (SYNTHROID) tablet 125 mcg  125 mcg Oral Daily Parks Ranger, [place]   125 mcg at 01/14/22 4496  ? LORazepam (ATIVAN) tablet 0.5 mg  0.5 mg Oral Q4H PRN Parks Ranger, DO      ? magnesium hydroxide (MILK OF MAGNESIA) suspension 30 mL  30 mL Oral Daily PRN Parks Ranger, DO      ? mirtazapine (REMERON) tablet 45 mg  45 mg Oral QHS Clapacs, Madie Reno, MD   45 mg at 01/13/22 2154  ? OLANZapine (ZYPREXA) tablet 5 mg  5 mg Oral Q6H PRN Parks Ranger, DO      ? traZODone (DESYREL) tablet 100 mg  100 mg Oral QHS PRN Parks Ranger, DO   100 mg at 01/13/22 2155  ? ?PTA Medications: ?Medications Prior to Admission  ?Medication Sig Dispense Refill Last Dose  ? doxycycline (VIBRAMYCIN) 100 MG capsule Take 1 capsule (100 mg total) by mouth 2 (two) times daily. (Patient not taking: Reported on 01/06/2022) 20 capsule 0   ? levothyroxine (SYNTHROID) 125 MCG tablet TAKE 1 TABLET BY MOUTH EVERY DAY (Patient taking  differently: Take 125 mcg by mouth daily before breakfast.) 30 tablet 0   ? ? ?Patient Stressors: Health problems   ?Medication change or noncompliance   ? ?Patient Strengths: Financial means  ?Special hobby/interest  ?Supportive family/friends  ? ?Treatment Modalities: Medication Management, Group therapy, Case management,  ?1 to 1 session with clinician, Psychoeducation, Recreational therapy. ? ? ?Physician Treatment Plan for Primary Diagnosis: Major depressive disorder, recurrent episode, severe ([place]) ?Long Term Goal(s): Improvement in symptoms so as ready for discharge  ? ?Short Term Goals: Ability to identify changes in lifestyle to reduce recurrence of condition will improve ?Ability to verbalize feelings will improve ?Ability to disclose and discuss suicidal ideas ?Ability to demonstrate self-control will improve ?Ability to identify and develop effective coping behaviors will improve ?Ability to maintain clinical measurements within normal limits will improve ?Compliance with prescribed medications will improve ?Ability to identify triggers associated with substance abuse/mental health issues will improve ? ?Medication Management: Evaluate patient's response, side effects, and tolerance of medication regimen. ? ?Therapeutic Interventions: 1 to 1 sessions, Unit Group sessions and Medication administration. ? ?Evaluation of Outcomes: Progressing ? ?Physician Treatment Plan for Secondary Diagnosis: Principal Problem: ?  Major depressive disorder, recurrent episode, severe ([place]) ?Active Problems: ?  Cognitive and behavioral changes ? ?Long Term Goal(s): Improvement in symptoms so as ready for discharge  ? ?Short Term Goals: Ability to identify  changes in lifestyle to reduce recurrence of condition will improve ?Ability to verbalize feelings will improve ?Ability to disclose and discuss suicidal ideas ?Ability to demonstrate self-control will improve ?Ability to identify and develop effective coping behaviors  will improve ?Ability to maintain clinical measurements within normal limits will improve ?Compliance with prescribed medications will improve ?Ability to identify triggers associated with substance abuse/mental health issues will improve    ? ?Medication Management: Evaluate patient's response, side effects, and tolerance of medication regimen. ? ?Therapeutic Interventions: 1 to 1 sessions, Unit Group sessions and Medication administration. ? ?Evaluation of Outcomes: Progressing ? ? ?RN Treatment Plan for Primary Diagnosis: Major depressive disorder, recurrent episode, severe ([place]) ?Long Term Goal(s): Knowledge of disease and therapeutic regimen to maintain health will improve ? ?Short Term Goals: Ability to remain free from injury will improve, Ability to verbalize frustration and anger appropriately will improve, Ability to demonstrate self-control, Ability to participate in decision making will improve, Ability to verbalize feelings will improve, Ability to disclose and discuss suicidal ideas, Ability to identify and develop effective coping behaviors will improve, and Compliance with prescribed medications will improve ? ?Medication Management: RN will administer medications as ordered by provider, will assess and evaluate patient's response and provide education to patient for prescribed medication. RN will report any adverse and/or side effects to prescribing provider. ? ?Therapeutic Interventions: 1 on 1 counseling sessions, Psychoeducation, Medication administration, Evaluate responses to treatment, Monitor vital signs and CBGs as ordered, Perform/monitor CIWA, COWS, AIMS and Fall Risk screenings as ordered, Perform wound care treatments as ordered. ? ?Evaluation of Outcomes: Progressing ? ? ?LCSW Treatment Plan for Primary Diagnosis: Major depressive disorder, recurrent episode, severe ([place]) ?Long Term Goal(s): Safe transition to appropriate next level of care at discharge, Engage patient in therapeutic  group addressing interpersonal concerns. ? ?Short Term Goals: Engage patient in aftercare planning with referrals and resources, Increase social support, Increase ability to appropriately verbalize feelings, Increase emotional regulation, Facilitate acceptance of mental health diagnosis and concerns, Identify triggers associated with mental health/substance abuse issues, and Increase skills for wellness and recovery ? ?Therapeutic Interventions: Assess for all discharge needs, 1 to 1 time with Education officer, museum, Explore available resources and support systems, Assess for adequacy in community support network, Educate family and significant other(s) on suicide prevention, Complete Psychosocial Assessment, Interpersonal group therapy. ? ?Evaluation of Outcomes: Progressing ? ? ?Progress in Treatment: ?Attending groups: Yes. ?Participating in groups: No. ?Taking medication as prescribed: Yes. ?Toleration medication: Yes. ?Family/Significant other contact made: Yes, individual(s) contacted:  SPE completed with pt's niece, O'Donley,Paula  ?Patient understands diagnosis: No. ?Discussing patient identified problems/goals with staff: No. ?Medical problems stabilized or resolved: Yes. ?Denies suicidal/homicidal ideation: Yes. ?Issues/concerns per patient self-inventory: No. ?Other: None ? ?New problem(s) identified: No, Describe:  None ? ?New Short Term/Long Term Goal(s): Patient to work towards medication management for mood stabilization; elimination of SI thoughts; development of comprehensive mental wellness/ plan. Update 01/14/22: No changes at this time.  ?  ?  ?Patient Goals:  "feel ready to go" Update 01/14/22: No changes at this time.  ?  ?Discharge Plan or Barriers: CSW will assist pt with development of appropriate discharge/aftercare plan. Update 01/14/22: No changes at this time.  ?  ?Reason for Continuation of Hospitalization: Depression ?Medication stabilization ?Suicidal ideation ?  ?Estimated Length of Stay:  TBD ? ?Last 3 [place] Suicide Severity Risk Score: ?[place] Admission (Current) from 01/07/2022 in [place] ED from 01/05/2022 in [place]  DEPARTMENT ED fro

## 2022-01-14 NOTE — Progress Notes (Signed)
Recreation Therapy Notes ?  ?Date: 01/14/2022 ?  ?Time: 1:40 pm ?  ?Location: Court yard  ?  ?Behavioral response: N/A ?  ?Intervention Topic: Leisure   ?  ?Discussion/Intervention: ?Patient refused to attend group.  ?  ?Clinical Observations/Feedback:  ?Patient refused to attend group.  ?  ?Vaishnav Demartin LRT/CTRS ? ? ? ? ? ? ? ?Ollin Hochmuth ?01/14/2022 3:05 PM ?

## 2022-01-14 NOTE — Progress Notes (Signed)
Patient remain alert with confusion. Currently in the day room eating breakfast among peers and tolerated well. Remain on 1:1 observation with staff within reach. Q 15 minute safety checks in progress. ?

## 2022-01-14 NOTE — Progress Notes (Signed)
[facility] MD Progress Note ? ?01/14/2022 11:50 AM ?Jay Webster  ?MRN:  [id number] ?Subjective: Jay Webster is seen on rounds today.  He is hard of hearing and it is difficult to have a conversation with him.  He states that he is doing okay.  Nurses notes that he remains pleasantly confused.  He is on a one-to-one and apparently PT came and saw him this morning and we are awaiting their recommendations.  He is compliant with his medications and denies any side effects.  There is no evidence of any side effects.  He seems to be in good spirits. ? ?Principal Problem: Major depressive disorder, recurrent episode, severe ([name]) ?Diagnosis: Principal Problem: ?  Major depressive disorder, recurrent episode, severe ([name]) ?Active Problems: ?  Cognitive and behavioral changes ? ?Total Time spent with patient: 15 minutes ? ?Past Psychiatric History: None ? ?Past Medical History:  ?Past Medical History:  ?Diagnosis Date  ? Arthritis   ? OA AND PAIN LEFT HIP AND OTHER JOINT PAINS  ? Cancer [employer] [employer])   ? THYROID CANCER - HAD THYROID REMOVED  ? History of kidney stones   ? History of shingles   ? RESOLVED  ? Hypertension   ? Hypothyroidism   ? Low back pain   ? Macular degeneration of both eyes   ? RBBB (right bundle branch block with left anterior fascicular block)   ? Seasonal allergies   ? Thyroid disease   ? Uric acid renal calculus 06/20/2007  ? Qualifier: Diagnosis of  By: Rogue Bussing CMA, Jacqualynn    ?  ?Past Surgical History:  ?Procedure Laterality Date  ? APPENDECTOMY  1948  ? CATARACT EXTRACTION Right 08/27/2019  ? COLONOSCOPY    ? Elbow Radical Reduction  1973  ? IR IMAGING GUIDED PORT INSERTION  05/05/2018  ? IR REMOVAL TUN ACCESS W/ PORT W/O FL MOD SED  10/18/2018  ? [place]  ? MASS EXCISION N/A 03/11/2018  ? Procedure: EXCISION MASS OF PERINEUM;  Surgeon: Armandina Gemma, MD;  Location: [facility] ORS;  Service: General;  Laterality: N/A;  ? REMOVAL OF FIRST RIB   [place]  ? FOR COMPRESSED VEIN BETWEEN 1 ST RIB AND  COLLARBONE  ? Rib removed, 1st  1994  ? ROTATOR CUFF REPAIR  2009  ? THYROIDECTOMY  2010  ? TOTAL HIP ARTHROPLASTY Left 05/31/2013  ? Procedure: LEFT TOTAL HIP ARTHROPLASTY ANTERIOR APPROACH;  Surgeon: Gearlean Alf, MD;  Location: [facility] ORS;  Service: Orthopedics;  Laterality: Left;  ? ?Family History:  ?Family History  ?Problem Relation Age of Onset  ? Heart disease Mother   ?     passed in 41s  ? Diabetes Mother   ? Alcohol abuse Father   ?     passed from this  ? Parkinson's disease Sister   ? Diabetes Brother   ? Dementia Sister   ? Cancer Brother   ?     sounds like bone cancer- non healing knee injury  ? ? ?Social History:  ?Social History  ? ?Substance and Sexual Activity  ?Alcohol Use Not Currently  ? Alcohol/week: 0.0 - 1.0 standard drinks  ?   ?Social History  ? ?Substance and Sexual Activity  ?Drug Use No  ?  ?Social History  ? ?Socioeconomic History  ? Marital status: Widowed  ?  Spouse name: Not on file  ? Number of children: Not on file  ? Years of education: Not on file  ? Highest education  level: Not on file  ?Occupational History  ? Occupation: retired  ?Tobacco Use  ? Smoking status: Former  ?  Types: Cigarettes  ?  Quit date: 02/22/1979  ?  Years since quitting: 42.9  ? Smokeless tobacco: Never  ?Vaping Use  ? Vaping Use: Never used  ?Substance and Sexual Activity  ? Alcohol use: Not Currently  ?  Alcohol/week: 0.0 - 1.0 standard drinks  ? Drug use: No  ? Sexual activity: Not Currently  ?Other Topics Concern  ? Not on file  ?Social History Narrative  ? Lives alone and 1 cat. Divorced from mongomous long term partner and eventual husband. No children from prior relationships.   ?   ? Retired Investment banker, corporate  ?   ? Hobbies: socializing, used to play tennis  ? ?Social Determinants of Health  ? ?Financial Resource Strain: Not on file  ?Food Insecurity: Not on file  ?Transportation Needs: Not on file  ?Physical Activity: Not on file  ?Stress: Not on file  ?Social Connections: Not on file   ? ?Additional Social History:  ?  ?  ?  ?  ?  ?  ?  ?  ?  ?  ?  ? ?Sleep: Good ? ?Appetite:  Good ? ?Current Medications: ?Current Facility-Administered Medications  ?Medication Dose Route Frequency Provider Last Rate Last Admin  ? alum & mag hydroxide-simeth (MAALOX/MYLANTA) 200-200-20 MG/5ML suspension 30 mL  30 mL Oral Q4H PRN Parks Ranger, DO      ? buPROPion (WELLBUTRIN XL) 24 hr tablet 150 mg  150 mg Oral Daily Parks Ranger, DO   150 mg at 01/14/22 9678  ? donepezil (ARICEPT) tablet 5 mg  5 mg Oral QHS Parks Ranger, DO   5 mg at 01/13/22 2154  ? haloperidol (HALDOL) tablet 0.5 mg  0.5 mg Oral [id number] Parks Ranger, DO   0.5 mg at 01/14/22 5102  ? levothyroxine (SYNTHROID) tablet 125 mcg  125 mcg Oral Daily Parks Ranger, [place]   125 mcg at 01/14/22 5852  ? LORazepam (ATIVAN) tablet 0.5 mg  0.5 mg Oral Q4H PRN Parks Ranger, DO      ? magnesium hydroxide (MILK OF MAGNESIA) suspension 30 mL  30 mL Oral Daily PRN Parks Ranger, DO      ? mirtazapine (REMERON) tablet 45 mg  45 mg Oral QHS Clapacs, Madie Reno, MD   45 mg at 01/13/22 2154  ? OLANZapine (ZYPREXA) tablet 5 mg  5 mg Oral Q6H PRN Parks Ranger, DO      ? traZODone (DESYREL) tablet 100 mg  100 mg Oral QHS PRN Parks Ranger, DO   100 mg at 01/13/22 2155  ? ? ?Lab Results: No results found for this or any previous visit (from the past 48 hour(s)). ? ?Blood Alcohol level:  ?Lab Results  ?Component Value Date  ? ETH <10 01/05/2022  ? ? ?Metabolic Disorder Labs: ?Lab Results  ?Component Value Date  ? HGBA1C 5.6 01/10/2022  ? MPG 114.02 01/10/2022  ? MPG 117 08/20/2020  ? ?No results found for: PROLACTIN ?Lab Results  ?Component Value Date  ? CHOL 146 01/10/2022  ? TRIG 52 01/10/2022  ? HDL 62 01/10/2022  ? CHOLHDL 2.4 01/10/2022  ? VLDL 10 01/10/2022  ? [place] 74 01/10/2022  ? [place] 69 02/19/2020  ? ? ?Physical Findings: ?AIMS: Facial and Oral Movements ?Muscles of  Facial Expression: None, normal ?Lips and Perioral Area: None, normal ?Jaw: None,  normal ?Tongue: None, normal,Extremity Movements ?Upper (arms, wrists, hands, fingers): None, normal ?Lower (legs, knees, ankles, toes): None, normal, Trunk Movements ?Neck, shoulders, hips: None, normal, Overall Severity ?Severity of abnormal movements (highest score from questions above): None, normal ?Incapacitation due to abnormal movements: None, normal ?Patient's awareness of abnormal movements (rate only patient's report): No Awareness, Dental Status ?Current problems with teeth and/or dentures?: No ?Does patient usually wear dentures?: No  ?CIWA:    ?COWS:    ? ?Musculoskeletal: ?Strength & Muscle Tone: within normal limits ?Gait & Station: normal ?Patient leans: N/A ? ?Psychiatric Specialty Exam: ? ?Presentation  ?General Appearance: No data recorded ?Eye Contact:No data recorded ?Speech:No data recorded ?Speech Volume:No data recorded ?Handedness:No data recorded ? ?Mood and Affect  ?Mood:No data recorded ?Affect:No data recorded ? ?Thought Process  ?Thought Processes:No data recorded ?Descriptions of Associations:No data recorded ?Orientation:No data recorded ?Thought Content:No data recorded ?History of Schizophrenia/Schizoaffective disorder:No data recorded ?Duration of Psychotic Symptoms:No data recorded ?Hallucinations:No data recorded ?Ideas of Reference:No data recorded ?Suicidal Thoughts:No data recorded ?Homicidal Thoughts:No data recorded ? ?Sensorium  ?Memory:No data recorded ?Judgment:No data recorded ?Insight:No data recorded ? ?Executive Functions  ?Concentration:No data recorded ?Attention Span:No data recorded ?Recall:No data recorded ?Fund of [place] recorded ?Language:No data recorded ? ?Psychomotor Activity  ?Psychomotor Activity:No data recorded ? ?Assets  ?Assets:No data recorded ? ?Sleep  ?Sleep:No data recorded ? ? ?Physical Exam: ?Physical Exam ?Vitals and nursing note reviewed.   ?Constitutional:   ?   Appearance: Normal appearance. He is normal weight.  ?Neurological:  ?   General: No focal deficit present.  ?   Mental Status: He is alert and oriented to person, place, and time.  ?Psychiatric:     ?   At

## 2022-01-14 NOTE — Progress Notes (Signed)
?   01/14/22 1915  ?Psych Admission Type (Psych Patients Only)  ?Admission Status Involuntary  ?Psychosocial Assessment  ?Patient Complaints Confusion;Other (Comment);Anxiety ?(HOH, impaired vision)  ?Eye Contact Fair  ?Facial Expression Anxious  ?Affect Appropriate to circumstance  ?Speech Logical/coherent  ?Interaction Minimal  ?Motor Activity Slow;Unsteady  ?Appearance/Hygiene In scrubs  ?Behavior Characteristics Cooperative;Anxious  ?Mood Sad  ?Thought Process  ?Coherency WDL  ?Content Blaming self ?(pt had just had episode of bowel incontinence)  ?Delusions None reported or observed  ?Perception WDL  ?Hallucination None reported or observed  ?Judgment Impaired  ?Confusion Moderate  ?Danger to Self  ?Current suicidal ideation? Denies  ?Danger to Others  ?Danger to Others None reported or observed  ? ?Pt has 1:1 sitter. Pt pleasant but confused and frequently incontinent. Pt denies SI, HI, AVH and pain. Pt rates anxiety 5/10 and denies depression. Pt is upset with himself for having an episode of incontinence. Pt provided emotional support. Pt has no other issues at this time.  ?

## 2022-01-14 NOTE — Evaluation (Signed)
Occupational Therapy Evaluation ?Patient Details ?Name: Jay Webster ?MRN: [id number] ?DOB: 1932-06-23 ?Today's Date: 01/14/2022 ? ? ?History of Present Illness Pt is an 88 yo male that presented to the ED for complaints of Psychiatric Evaluation and IVC. PMH of HTN, low back pain, HOH, macular degeneration of both eyes, thyroid disease, removal of first rib, L THA.  ? ?Clinical Impression ?  ?Upon entering the room, pt supine in bed with sitter present. Pt is agreeable to OT intervention with no c/o pain. Pt performs all bed mobility without physical assistance. Pt is oriented to self only this session. He is pleasant and cooperative. No family present to confirm baseline but pt reports living at home independently. Per chart review, family has reached out to clinicians about pt's rapid memory and cognitive decline. Pt is able to demonstrate functional tasks such as toileting and ambulation without physical assistance but needing cuing for safety awareness and sequencing. Staff on unit have placed signage on wall to direct pt to room from hallways but he is unable to follow it. Pt given SLUMs assessment to further assess cognition with pt scoring 4/30 which places him in the dementia level. Pt would need 24/7 supervision for safety as well as assistance with IADLs and medication management at discharge which is why LTC is recommended. Unit is already doing an excellent job of placing modification in place and addressing pt's safety. Pt does not need further skilled acute OT intervention at this time. OT to SIGN OFF.  ?   ? ?Recommendations for follow up therapy are one component of a multi-disciplinary discharge planning process, led by the attending physician.  Recommendations may be updated based on patient status, additional functional criteria and insurance authorization.  ? ?Follow Up Recommendations ? Long-term institutional care without follow-up therapy  ?  ?Assistance Recommended at Discharge Frequent or  constant Supervision/Assistance (24/7 supervision at discharge secondary to cognition)  ?Patient can return home with the following   ? ?  ?   ?Equipment Recommendations ? None recommended by OT  ?  ?   ?Precautions / Restrictions Precautions ?Precautions: Fall ?Restrictions ?Weight Bearing Restrictions: No  ? ?  ? ?Mobility Bed Mobility ?Overal bed mobility: Modified Independent ?  ?  ?  ?  ?  ?  ?  ?  ? ?Transfers ?Overall transfer level: Modified independent ?Equipment used: Rolling walker (2 wheels) ?  ?  ?  ?  ?  ?  ?  ?  ?  ? ?  ?Balance Overall balance assessment: Needs assistance ?Sitting-balance support: Feet supported ?Sitting balance-Leahy Scale: Good ?  ?  ?Standing balance support: Reliant on assistive device for balance ?Standing balance-Leahy Scale: Good ?  ?  ?  ?  ?  ?  ?  ?  ?  ?  ?  ?  ?   ? ?ADL either performed or assessed with clinical judgement  ? ?ADL Overall ADL's : Needs assistance/impaired ?  ?  ?  ?  ?  ?  ?  ?  ?  ?  ?  ?  ?  ?  ?  ?  ?  ?  ?  ?General ADL Comments: supervision with cuing for toileting needs and transfer onto commode with use of RW but no physical assistance.  ? ? ? ?Vision Patient Visual Report: No change from baseline ?   ?   ?   ?   ? ?Pertinent Vitals/Pain Pain Assessment ?Pain Assessment: No/denies pain  ? ? ? ?  Hand Dominance Right ?  ?Extremity/Trunk Assessment Upper Extremity Assessment ?Upper Extremity Assessment: Overall WFL for tasks assessed ?  ?Lower Extremity Assessment ?Lower Extremity Assessment: Overall WFL for tasks assessed ?  ?  ?  ?Communication Communication ?Communication: HOH ?  ?Cognition Arousal/Alertness: Awake/alert ?Behavior During Therapy: Flat affect ?Overall Cognitive Status: No family/caregiver present to determine baseline cognitive functioning ?  ?  ?  ?  ?  ?  ?  ?  ?  ?  ?  ?  ?  ?  ?  ?  ?General Comments: Pt is oriented to self only. Per chart review, family has reported increased concerns in regards to pt's declining cognition. Pt  reports no family living near him but has a niece in [place] that checks on him. ?  ?  ?   ?   ?   ? ? ?Home Living Family/patient expects to be discharged to:: Private residence ?Living Arrangements: Alone ?  ?Type of Home: House ?Home Access: Stairs to enter ?Entrance Stairs-Number of Steps: 4-5 ?Entrance Stairs-Rails: None ?Home Layout: One level ?  ?  ?Bathroom Shower/Tub: Tub/shower unit ?  ?Bathroom Toilet: Handicapped height ?  ?  ?Home Equipment: Conservation officer, nature (2 wheels);Cane - single point ?  ?  ?  ? ?  ?Prior Functioning/Environment Prior Level of Function : Patient poor historian/Family not available ?  ?  ?  ?  ?  ?  ?Mobility Comments: pt reported he recently started using a walker due to some weakness ?ADLs Comments: Pt reports independence with self care tasks ?  ? ?  ?  ?   ?   ?   ?OT Goals(Current goals can be found in the care plan section) Acute Rehab OT Goals ?Patient Stated Goal: none stated ?OT Goal Formulation: Patient unable to participate in goal setting ?Time For Goal Achievement: 01/28/22 ?Potential to Achieve Goals: Fair  ?OT Frequency:   ?  ? ?   ?AM-PAC OT "6 Clicks" Daily Activity     ?Outcome Measure Help from another person eating meals?: None ?Help from another person taking care of personal grooming?: None ?Help from another person toileting, which includes using toliet, bedpan, or urinal?: None ?Help from another person bathing (including washing, rinsing, drying)?: None ?Help from another person to put on and taking off regular upper body clothing?: None ?Help from another person to put on and taking off regular lower body clothing?: None ?6 Click Score: 24 ?  ?End of Session Equipment Utilized During Treatment: Rolling walker (2 wheels) ?Nurse Communication: Mobility status;Other (comment) (cognition) ? ?Activity Tolerance: Patient tolerated treatment well ?Patient left: in bed;with call bell/phone within reach;with bed alarm set ? ?   ?              ?Time: 5009-3818 ?OT Time  Calculation (min): 31 min ?Charges:  OT General Charges ?$OT Visit: 1 Visit ?OT Evaluation ?$OT Eval Moderate Complexity: 1 Mod ?OT Treatments ?$Therapeutic Activity: 8-22 mins ? ?Darleen Crocker, MS, OTR/L , CBIS ?ascom [phone]  ?01/14/22, 3:19 PM  ?

## 2022-01-14 NOTE — Progress Notes (Signed)
1:1 observation continue with sitter at bedside. Remain confused and forgetful. Irritable at times. Ate super in the day room and tolerated well. Q15 minute safety check continue. ?

## 2022-01-14 NOTE — [facility] Group Notes (Signed)
Group: outdoor courtyard , Pt participated in outdoor group activities  ?

## 2022-01-14 NOTE — Plan of Care (Signed)
Patient presents calm with minimal aggressive outburst. Pt denies SI/HI AV/H and contracts for safety.  Pt did state "Jesus help me out of this".  Pt offered emotional support and encouragement.  Pt did comply with scheduled medications.  Pt did have hs snack.  Q 15 minute safety checks in place.  Will continue plan of care.   ? ?Problem: Education: ?Goal: Knowledge of [facility] Education information/materials will improve ?Outcome: Progressing ?Goal: Emotional status will improve ?Outcome: Progressing ?Goal: Verbalization of understanding the information provided will improve ?Outcome: Progressing ?  ?

## 2022-01-14 NOTE — Progress Notes (Signed)
Pt had an episode of bowel incontinence. This Programmer, systems to clean pt up. Pt said it just slipped out. Pt had large soft bowel movement. After cleaning pt up, pt wanted to get up and go to the bathroom. Stool resembled diarrhea.  ?

## 2022-01-14 NOTE — Plan of Care (Signed)
Patient remain alert to person. Confused and forgetful with poor safety awareness. Denies SI, HI, and AVH. Denies anxiety and depression. Ate meals in the day room among peers with good appetite. Compliant with medications. Remain on 1:1 safety observation with staff within reach. Patient is currently sitting in the day room in no apparent distress. Remain safe on the unit with Q15 minute safety check. ? ?Problem: Education: ?Goal: Knowledge of [facility] Education information/materials will improve ?Outcome: Progressing ?Goal: Emotional status will improve ?Outcome: Progressing ?Goal: Mental status will improve ?Outcome: Progressing ?Goal: Verbalization of understanding the information provided will improve ?Outcome: Progressing ?  ?Problem: Activity: ?Goal: Interest or engagement in activities will improve ?Outcome: Progressing ?Goal: Sleeping patterns will improve ?Outcome: Progressing ?  ?Problem: Coping: ?Goal: Ability to verbalize frustrations and anger appropriately will improve ?Outcome: Progressing ?Goal: Ability to demonstrate self-control will improve ?Outcome: Progressing ?  ?Problem: Health Behavior/Discharge Planning: ?Goal: Identification of resources available to assist in meeting health care needs will improve ?Outcome: Progressing ?Goal: Compliance with treatment plan for underlying cause of condition will improve ?Outcome: Progressing ?  ?Problem: Physical Regulation: ?Goal: Ability to maintain clinical measurements within normal limits will improve ?Outcome: Progressing ?  ?Problem: Safety: ?Goal: Periods of time without injury will increase ?Outcome: Progressing ?  ?Problem: Education: ?Goal: Ability to make informed decisions regarding treatment will improve ?Outcome: Progressing ?  ?Problem: Health Behavior/Discharge Planning: ?Goal: Identification of resources available to assist in meeting health care needs will improve ?Outcome: Progressing ?  ?Problem: Medication: ?Goal:  Compliance with prescribed medication regimen will improve ?Outcome: Progressing ?  ?Problem: Self-Concept: ?Goal: Will verbalize positive feelings about self ?Outcome: Progressing ?  ?Problem: Activity: ?Goal: Will identify at least one activity in which they can participate ?Outcome: Progressing ?  ?Problem: Coping: ?Goal: Ability to identify and develop effective coping behavior will improve ?Outcome: Progressing ?Goal: Ability to interact with others will improve ?Outcome: Progressing ?Goal: Demonstration of participation in decision-making regarding own care will improve ?Outcome: Progressing ?Goal: Ability to use eye contact when communicating with others will improve ?Outcome: Progressing ?  ?Problem: Health Behavior/Discharge Planning: ?Goal: Identification of resources available to assist in meeting health care needs will improve ?Outcome: Progressing ?  ?Problem: Self-Concept: ?Goal: Will verbalize positive feelings about self ?Outcome: Progressing ?  ?

## 2022-01-15 DIAGNOSIS — R4189 Other symptoms and signs involving cognitive functions and awareness: Secondary | ICD-10-CM | POA: Diagnosis not present

## 2022-01-15 DIAGNOSIS — R4689 Other symptoms and signs involving appearance and behavior: Secondary | ICD-10-CM | POA: Diagnosis not present

## 2022-01-15 NOTE — Progress Notes (Signed)
Physical Therapy Treatment ?Patient Details ?Name: Jay Webster ?MRN: [id number] ?DOB: 1931/10/30 ?Today's Date: 01/15/2022 ? ? ?History of Present Illness Pt is an 88 yo male that presented to the ED for complaints of Psychiatric Evaluation and IVC. PMH of HTN, low back pain, HOH, macular degeneration of both eyes, thyroid disease, removal of first rib, L THA. ? ?  ?PT Comments  ? ? Patient wakes easily, HOH. Agreeable to mobility. Pt educated on proper use of RW for transfers, poor follow through on learning. Sit <> Stand modI, and ambulated ~1103f, modI. Occasional cues for pt to remain inside the walker. Some dual tasking and environmental scanning performed during ambulation until pt reported he had to urinate. Pt needed at least mod to attend to task and to focus on task. Pt back in bed with all needs in reach at end of session. Current recommendation remains appropriate.  ? ?   ?Recommendations for follow up therapy are one component of a multi-disciplinary discharge planning process, led by the attending physician.  Recommendations may be updated based on patient status, additional functional criteria and insurance authorization. ? ?Follow Up Recommendations ? Long-term institutional care without follow-up therapy ?  ?  ?Assistance Recommended at Discharge Frequent or constant Supervision/Assistance  ?Patient can return home with the following Assistance with cooking/housework;Direct supervision/assist for financial management;Assist for transportation;Help with stairs or ramp for entrance;Direct supervision/assist for medications management ?  ?Equipment Recommendations ? Rolling walker (2 wheels)  ?  ?Recommendations for Other Services   ? ? ?  ?Precautions / Restrictions Precautions ?Precautions: Fall ?Restrictions ?Weight Bearing Restrictions: No  ?  ? ?Mobility ? Bed Mobility ?Overal bed mobility: Modified Independent ?  ?  ?  ?  ?  ?  ?  ?  ? ?Transfers ?Overall transfer level: Modified  independent ?Equipment used: Rolling walker (2 wheels) ?  ?  ?  ?  ?  ?  ?  ?General transfer comment: educated on hand placement for transfer, poor carry over ?  ? ?Ambulation/Gait ?  ?Gait Distance (Feet): 120 Feet ?Assistive device: Rolling walker (2 wheels) ?  ?Gait velocity: decreased ?  ?  ?General Gait Details: some dynamic tasks during ambulation performed ? ? ?Stairs ?  ?  ?  ?  ?  ? ? ?Wheelchair Mobility ?  ? ?Modified Rankin (Stroke Patients Only) ?  ? ? ?  ?Balance Overall balance assessment: Needs assistance ?Sitting-balance support: Feet supported ?Sitting balance-Leahy Scale: Good ?  ?  ?Standing balance support: Reliant on assistive device for balance ?Standing balance-Leahy Scale: Good ?Standing balance comment: able to stand and urinate, wash his hands at his sink. Cues to direct pt to task correctly ?  ?  ?  ?  ?  ?  ?  ?  ?  ?  ?  ?  ? ?  ?Cognition Arousal/Alertness: Awake/alert ?Behavior During Therapy: Flat affect ?Overall Cognitive Status: No family/caregiver present to determine baseline cognitive functioning ?  ?  ?  ?  ?  ?  ?  ?  ?  ?  ?  ?  ?  ?  ?  ?  ?General Comments: Pt is oriented to self only ?  ?  ? ?  ?Exercises   ? ?  ?General Comments   ?  ?  ? ?Pertinent Vitals/Pain Pain Assessment ?Pain Assessment: No/denies pain  ? ? ?Home Living   ?  ?  ?  ?  ?  ?  ?  ?  ?  ?   ?  ?  Prior Function    ?  ?  ?   ? ?PT Goals (current goals can now be found in the care plan section) Progress towards PT goals: Progressing toward goals ? ?  ?Frequency ? ? ? Min 2X/week ? ? ? ?  ?PT Plan Current plan remains appropriate  ? ? ?Co-evaluation   ?  ?  ?  ?  ? ?  ?AM-PAC PT "6 Clicks" Mobility   ?Outcome Measure ? Help needed turning from your back to your side while in a flat bed without using bedrails?: None ?Help needed moving from lying on your back to sitting on the side of a flat bed without using bedrails?: None ?Help needed moving to and from a bed to a chair (including a wheelchair)?:  None ?Help needed standing up from a chair using your arms (e.g., wheelchair or bedside chair)?: None ?Help needed to walk in hospital room?: None ?Help needed climbing 3-5 steps with a railing? : A Little ?6 Click Score: 23 ? ?  ?End of Session Equipment Utilized During Treatment: Gait belt ?Activity Tolerance: Patient tolerated treatment well ?Patient left: in bed;with nursing/sitter in room ?Nurse Communication: Mobility status ?PT Visit Diagnosis: Other abnormalities of gait and mobility (R26.89) ?  ? ? ?Time: 0938-1829 ?PT Time Calculation (min) (ACUTE ONLY): 18 min ? ?Charges:  $Therapeutic Activity: 8-22 mins          ?          ? ?Lieutenant Diego PT, DPT ?3:37 PM,01/15/22 ? ? ?

## 2022-01-15 NOTE — Progress Notes (Signed)
[facility] MD Progress Note ? ?01/15/2022 11:05 AM ?Jay Webster  ?MRN:  [id number] ?Subjective: Jay Webster is seen on rounds today.  He remains pleasantly confused.  He is oriented x1.  He is able to carry on a conversation but is very hard of hearing.  He also has trouble with eyesight.  He has been compliant with medications and there is no evidence of any side effects.  Nurses notes state that he is sleeping well.  He remains one-to-one and needs help with his ADLs.  I am still awaiting to hear from PT and OT.  Social work is making referrals for assisted living. ? ?Principal Problem: Major depressive disorder, recurrent episode, severe ([place]) ?Diagnosis: Principal Problem: ?  Major depressive disorder, recurrent episode, severe ([place]) ?Active Problems: ?  Cognitive and behavioral changes ? ?Total Time spent with patient: 15 minutes ? ?Past Psychiatric History: None ? ?Past Medical History:  ?Past Medical History:  ?Diagnosis Date  ? Arthritis   ? OA AND PAIN LEFT HIP AND OTHER JOINT PAINS  ? Cancer [facility])   ? THYROID CANCER - HAD THYROID REMOVED  ? History of kidney stones   ? History of shingles   ? RESOLVED  ? Hypertension   ? Hypothyroidism   ? Low back pain   ? Macular degeneration of both eyes   ? RBBB (right bundle branch block with left anterior fascicular block)   ? Seasonal allergies   ? Thyroid disease   ? Uric acid renal calculus 06/20/2007  ? Qualifier: Diagnosis of  By: Rogue Bussing CMA, Jacqualynn    ?  ?Past Surgical History:  ?Procedure Laterality Date  ? APPENDECTOMY  1948  ? CATARACT EXTRACTION Right 08/27/2019  ? COLONOSCOPY    ? Elbow Radical Reduction  1973  ? IR IMAGING GUIDED PORT INSERTION  05/05/2018  ? IR REMOVAL TUN ACCESS W/ PORT W/O FL MOD SED  10/18/2018  ? [place]  ? MASS EXCISION N/A 03/11/2018  ? Procedure: EXCISION MASS OF PERINEUM;  Surgeon: Armandina Gemma, MD;  Location: [facility];  Service: General;  Laterality: N/A;  ? REMOVAL OF FIRST RIB   [place]  ? FOR COMPRESSED VEIN  BETWEEN 1 ST RIB AND COLLARBONE  ? Rib removed, 1st  1994  ? ROTATOR CUFF REPAIR  2009  ? THYROIDECTOMY  2010  ? TOTAL HIP ARTHROPLASTY Left 05/31/2013  ? Procedure: LEFT TOTAL HIP ARTHROPLASTY ANTERIOR APPROACH;  Surgeon: Gearlean Alf, MD;  Location: [facility];  Service: Orthopedics;  Laterality: Left;  ? ?Family History:  ?Family History  ?Problem Relation Age of Onset  ? Heart disease Mother   ?     passed in 23s  ? Diabetes Mother   ? Alcohol abuse Father   ?     passed from this  ? Parkinson's disease Sister   ? Diabetes Brother   ? Dementia Sister   ? Cancer Brother   ?     sounds like bone cancer- non healing knee injury  ? ? ?Social History:  ?Social History  ? ?Substance and Sexual Activity  ?Alcohol Use Not Currently  ? Alcohol/week: 0.0 - 1.0 standard drinks  ?   ?Social History  ? ?Substance and Sexual Activity  ?Drug Use No  ?  ?Social History  ? ?Socioeconomic History  ? Marital status: Widowed  ?  Spouse name: Not on file  ? Number of children: Not on file  ? Years of education: Not on  file  ? Highest education level: Not on file  ?Occupational History  ? Occupation: retired  ?Tobacco Use  ? Smoking status: Former  ?  Types: Cigarettes  ?  Quit date: 02/22/1979  ?  Years since quitting: 42.9  ? Smokeless tobacco: Never  ?Vaping Use  ? Vaping Use: Never used  ?Substance and Sexual Activity  ? Alcohol use: Not Currently  ?  Alcohol/week: 0.0 - 1.0 standard drinks  ? Drug use: No  ? Sexual activity: Not Currently  ?Other Topics Concern  ? Not on file  ?Social History Narrative  ? Lives alone and 1 cat. Divorced from mongomous long term partner and eventual husband. No children from prior relationships.   ?   ? Retired Investment banker, corporate  ?   ? Hobbies: socializing, used to play tennis  ? ?Social Determinants of Health  ? ?Financial Resource Strain: Not on file  ?Food Insecurity: Not on file  ?Transportation Needs: Not on file  ?Physical Activity: Not on file  ?Stress: Not on file  ?Social Connections:  Not on file  ? ?Additional Social History:  ?  ?  ?  ?  ?  ?  ?  ?  ?  ?  ?  ? ?Sleep: Good ? ?Appetite:  Good ? ?Current Medications: ?Current Facility-Administered Medications  ?Medication Dose Route Frequency Provider Last Rate Last Admin  ? alum & mag hydroxide-simeth (MAALOX/MYLANTA) 200-200-20 MG/5ML suspension 30 mL  30 mL Oral Q4H PRN Parks Ranger, DO      ? buPROPion (WELLBUTRIN XL) 24 hr tablet 150 mg  150 mg Oral Daily Parks Ranger, DO   150 mg at 01/15/22 0931  ? donepezil (ARICEPT) tablet 5 mg  5 mg Oral QHS Parks Ranger, DO   5 mg at 01/14/22 2105  ? haloperidol (HALDOL) tablet 0.5 mg  0.5 mg Oral [id number] Parks Ranger, DO   0.5 mg at 01/15/22 4174  ? levothyroxine (SYNTHROID) tablet 125 mcg  125 mcg Oral Daily Parks Ranger, [place]   125 mcg at 01/15/22 0814  ? LORazepam (ATIVAN) tablet 0.5 mg  0.5 mg Oral Q4H PRN Parks Ranger, DO      ? magnesium hydroxide (MILK OF MAGNESIA) suspension 30 mL  30 mL Oral Daily PRN Parks Ranger, DO      ? mirtazapine (REMERON) tablet 45 mg  45 mg Oral QHS Clapacs, Madie Reno, MD   45 mg at 01/14/22 2105  ? OLANZapine (ZYPREXA) tablet 5 mg  5 mg Oral Q6H PRN Parks Ranger, DO      ? traZODone (DESYREL) tablet 100 mg  100 mg Oral QHS PRN Parks Ranger, DO   100 mg at 01/14/22 2105  ? ? ?Lab Results: No results found for this or any previous visit (from the past 48 hour(s)). ? ?Blood Alcohol level:  ?Lab Results  ?Component Value Date  ? ETH <10 01/05/2022  ? ? ?Metabolic Disorder Labs: ?Lab Results  ?Component Value Date  ? HGBA1C 5.6 01/10/2022  ? MPG 114.02 01/10/2022  ? MPG 117 08/20/2020  ? ?No results found for: PROLACTIN ?Lab Results  ?Component Value Date  ? CHOL 146 01/10/2022  ? TRIG 52 01/10/2022  ? HDL 62 01/10/2022  ? CHOLHDL 2.4 01/10/2022  ? VLDL 10 01/10/2022  ? [place] 74 01/10/2022  ? [place] 69 02/19/2020  ? ? ?Physical Findings: ?AIMS: Facial and Oral  Movements ?Muscles of Facial Expression: None, normal ?Lips and Perioral  Area: None, normal ?Jaw: None, normal ?Tongue: None, normal,Extremity Movements ?Upper (arms, wrists, hands, fingers): None, normal ?Lower (legs, knees, ankles, toes): None, normal, Trunk Movements ?Neck, shoulders, hips: None, normal, Overall Severity ?Severity of abnormal movements (highest score from questions above): None, normal ?Incapacitation due to abnormal movements: None, normal ?Patient's awareness of abnormal movements (rate only patient's report): No Awareness, Dental Status ?Current problems with teeth and/or dentures?: No ?Does patient usually wear dentures?: No  ?CIWA:    ?COWS:    ? ?Musculoskeletal: ?Strength & Muscle Tone: within normal limits ?Gait & Station: unsteady ?Patient leans: N/A ? ?Psychiatric Specialty Exam: ? ?Presentation  ?General Appearance: No data recorded ?Eye Contact:No data recorded ?Speech:No data recorded ?Speech Volume:No data recorded ?Handedness:No data recorded ? ?Mood and Affect  ?Mood:No data recorded ?Affect:No data recorded ? ?Thought Process  ?Thought Processes:No data recorded ?Descriptions of Associations:No data recorded ?Orientation:No data recorded ?Thought Content:No data recorded ?History of Schizophrenia/Schizoaffective disorder:No data recorded ?Duration of Psychotic Symptoms:No data recorded ?Hallucinations:No data recorded ?Ideas of Reference:No data recorded ?Suicidal Thoughts:No data recorded ?Homicidal Thoughts:No data recorded ? ?Sensorium  ?Memory:No data recorded ?Judgment:No data recorded ?Insight:No data recorded ? ?Executive Functions  ?Concentration:No data recorded ?Attention Span:No data recorded ?Recall:No data recorded ?Fund of [place] recorded ?Language:No data recorded ? ?Psychomotor Activity  ?Psychomotor Activity:No data recorded ? ?Assets  ?Assets:No data recorded ? ?Sleep  ?Sleep:No data recorded ? ? ?Physical Exam: ?Physical Exam ?Vitals and nursing note  reviewed.  ?Constitutional:   ?   Appearance: Normal appearance. He is normal weight.  ?Neurological:  ?   General: No focal deficit present.  ?   Mental Status: He is alert and oriented to person, place, and time.

## 2022-01-15 NOTE — BHH Counselor (Signed)
CSW received call from staff member at [facility] ?[address] [place] [address] [phone]  ?Stated that they do not have any male beds available at this time. CSW will continue to follow up with other facilities.  ? ?Rajohn Henery [place], MSW, LCSW-A ?5/11/20233:13 PM  ?

## 2022-01-15 NOTE — Progress Notes (Signed)
Nursing 1:1 note ?D:Pt observed sleeping in bed with eyes closed. RR even and unlabored. No distress noted. ?A: 1:1 observation continues for safety. Sitter at bedside. ?R: Pt remains safe.  ?

## 2022-01-15 NOTE — Plan of Care (Signed)
Patient was asleep at the beginning of this shift. Currently in the dayroom and attending group. Continues to experience memory problem leading to frustration. He is otherwise pleasant and cooperative. Was assisted with hygiene. Ate breakfast and received AM medications. Staff continue to provide support and encouragements. Safety precautions maintained on 1:1 observations.  ?

## 2022-01-15 NOTE — Progress Notes (Signed)
Patient took a nap and came back to the dayroom toward dinnertime. Currently eating dinner. Cooperative and calm. Taking medications as prescribed. Safety monitored on 1:1 for fall precautions.  ?

## 2022-01-15 NOTE — [place] FL2 (Signed)
?[place] MEDICAID FL2 LEVEL OF CARE SCREENING TOOL  ?  ? ?IDENTIFICATION  ?Patient Name: ?Jay Webster Birthdate: 07/25/1932 Sex: male Admission Date (Current Location): ?01/07/2022  ?[place] and [place] Number: ? [place] ?  Facility and Address:  ?[facility], [address] [place], [place] [address] ?     Provider Number: ?[id number]  ?Attending Physician Name and Address:  ?Parks Ranger,* ? Relative Name and Phone Number:  ?Eaden Hettinger [phone] ?   ?Current Level of Care: ?Hospital Recommended Level of Care: ?[place], [facility], Memory Care Prior Approval Number: ?  ? ?Date Approved/Denied: ?  PASRR Number: ?  ? ?Discharge Plan: ?Other (Comment) (ALF, NF, [place]) ?  ? ?Current Diagnoses: ?Patient Active Problem List  ? Diagnosis Date Noted  ? Major depressive disorder, recurrent episode, severe ([place]) 01/08/2022  ? Cognitive and behavioral changes 01/07/2022  ? Abdominal pain 10/30/2021  ? Open fracture of tuft of distal phalanx of finger 09/16/2021  ? Depression, major, single episode, moderate ([place]) 03/26/2021  ? Decreased hearing of both ears 03/26/2021  ? Decreased visual acuity 03/26/2021  ? Pulmonary emphysema, unspecified emphysema type ([place]) 02/19/2020  ? Atherosclerosis of aorta ([place]) 02/19/2020  ? Memory loss 02/19/2020  ? Pseudophakia of both eyes 12/13/2019  ? Former smoker 08/16/2019  ? Depression, major, single episode, mild ([place]) 08/16/2019  ? Diffuse large B-cell lymphoma ([place]) 03/30/2018  ? Degeneration of lumbar intervertebral disc 10/05/2017  ? History of total hip arthroplasty 10/05/2017  ? OA (osteoarthritis) of hip 05/31/2013  ? History of thyroid cancer 04/21/2011  ? Macular degeneration 01/05/2011  ? HYPOTHYROIDISM, POSTSURGICAL 11/20/2008  ? Osteoarthritis 07/01/2007  ? Uric acid renal calculus 06/20/2007  ? Essential hypertension 06/20/2007  ? ? ?Orientation RESPIRATION BLADDER Height & Weight   ?   ?Self ? Normal Incontinent (Occasional) Weight: 185 lb 14.3 oz (84.3 kg) ?Height:  '5\' 4"'$  (162.6 cm)  ?BEHAVIORAL SYMPTOMS/MOOD NEUROLOGICAL BOWEL NUTRITION STATUS  ? (N/A)  (N/A) Incontinent (occasional)  (N/A)  ?AMBULATORY STATUS COMMUNICATION OF NEEDS Skin   ?Supervision (uses a 2 wheel rolling walker) Verbally Normal ?  ?  ?  ?    ?     ?     ? ? ?Personal Care Assistance Level of Assistance  ?Bathing, Dressing Bathing Assistance: Limited assistance ?  ?Dressing Assistance: Limited assistance ?   ? ?Functional Limitations Info  ?Sight, Hearing Sight Info: Impaired (significant loss of sight) ?Hearing Info: Impaired (hard of hearing) ?   ? ? ?SPECIAL CARE FACTORS FREQUENCY  ? (High fall risk)   ?  ?  ?  ?  ?  ?  ?   ? ? ?Contractures Contractures Info: Not present  ? ? ?Additional Factors Info  ?Code Status Code Status Info: DNR ?  ?  ?  ?  ?   ? ?Current Medications (01/15/2022):  This is the current hospital active medication list ?Current Facility-Administered Medications  ?Medication Dose Route Frequency Provider Last Rate Last Admin  ? alum & mag hydroxide-simeth (MAALOX/MYLANTA) 200-200-20 MG/5ML suspension 30 mL  30 mL Oral Q4H PRN Parks Ranger, DO      ? buPROPion (WELLBUTRIN XL) 24 hr tablet 150 mg  150 mg Oral Daily Parks Ranger, DO   150 mg at 01/15/22 0931  ? donepezil (ARICEPT) tablet 5 mg  5 mg Oral QHS Parks Ranger, DO   5 mg at 01/14/22 2105  ? haloperidol (HALDOL)  tablet 0.5 mg  0.5 mg Oral [id number] Parks Ranger, DO   0.5 mg at 01/15/22 8921  ? levothyroxine (SYNTHROID) tablet 125 mcg  125 mcg Oral Daily Parks Ranger, [place]   125 mcg at 01/15/22 1941  ? LORazepam (ATIVAN) tablet 0.5 mg  0.5 mg Oral Q4H PRN Parks Ranger, DO      ? magnesium hydroxide (MILK OF MAGNESIA) suspension 30 mL  30 mL Oral Daily PRN Parks Ranger, DO      ? mirtazapine (REMERON) tablet 45 mg  45 mg Oral QHS Clapacs, Madie Reno, MD   45 mg at 01/14/22 2105   ? OLANZapine (ZYPREXA) tablet 5 mg  5 mg Oral Q6H PRN Parks Ranger, DO      ? traZODone (DESYREL) tablet 100 mg  100 mg Oral QHS PRN Parks Ranger, DO   100 mg at 01/14/22 2105  ? ? ? ?Discharge Medications: ?Please see discharge summary for a list of discharge medications. ? ?Relevant Imaging Results: ? ?Relevant Lab Results: ? ? ?Additional Information ?  ? ?Solash Tullo A [place], LCSWA ? ? ? ? ?

## 2022-01-15 NOTE — Progress Notes (Signed)
Nursing 1:1 note ?D:Pt observed sleeping in bed with eyes closed. RR even and unlabored. No distress noted. ?A: 1:1 observation continues for safety. Sitter at bedside.  ?R: Pt remains safe. ? ?(Note entered at 0243 d/t CHL downtime.) ?

## 2022-01-15 NOTE — Progress Notes (Signed)
Patient stayed in the dayroom with peers. Currently receiving PT services and tolerating well.  ?

## 2022-01-15 NOTE — BHH Group Notes (Signed)
Group: Day room, Patient handbook  ?Pt did not participate /  asleep  ? ? ?

## 2022-01-15 NOTE — Progress Notes (Signed)
Recreation Therapy Notes ? ?Date: 01/15/2022 ?  ?Time: 1:40 PM   ?  ?Location: Court yard  ?  ?Behavioral response: N/A ?  ?Intervention Topic: Social Skills  ?  ?Discussion/Intervention: ?Patient refused to attend group.  ?  ?Clinical Observations/Feedback:  ?Patient refused to attend group.  ?  ?Luise Yamamoto LRT/CTRS ?  ? ? ? ? ? ? ? ?Kaysin Brock ?01/15/2022 2:39 PM ?

## 2022-01-15 NOTE — Progress Notes (Signed)
Order for 1:1 needed to be re-entered because staffing called and couldn't see that we were in need of a sitter. Safety Observation order re-entered.  ?

## 2022-01-16 DIAGNOSIS — R4189 Other symptoms and signs involving cognitive functions and awareness: Secondary | ICD-10-CM | POA: Diagnosis not present

## 2022-01-16 DIAGNOSIS — R4689 Other symptoms and signs involving appearance and behavior: Secondary | ICD-10-CM | POA: Diagnosis not present

## 2022-01-16 NOTE — BHH Group Notes (Addendum)
?  Date: 01/16/2022 ?  ?Time: 1:40pm  ?  ?Location: Court yard  ?  ?Behavioral response: N/A ?  ?Intervention Topic: Stress Management  ?  ?Discussion/Intervention: ?Patient refused to attend group.  ?  ?Clinical Observations/Feedback:  ?Patient refused to attend group.  ?  ?Breon Rehm LRT/CTRS ?  ?

## 2022-01-16 NOTE — Progress Notes (Signed)
Patient is calm and cooperative with assessment. He denies SI, HI, and AVH. He denies pain. Patient is only oriented to self. Patient is sitting in the dayroom eating breakfast. He remains on 1:1 for safety. ?

## 2022-01-16 NOTE — Progress Notes (Signed)
Patient was sleeping in the dayroom in the afternoon. He is easily aroused when calling his name. Patient was given his dinner tray, but went back to sleep after receiving it. Patient was woken up again. Patient says that he is having a hard time waking up. Patient's meal tray was set up. Patient is observed to be feeding himself in the dayroom a few minutes later. Patient is calm and remains on 1:1.  ?

## 2022-01-16 NOTE — Progress Notes (Addendum)
Patient is seen in his bedroom at the beginning of the shift. He is only oriented to his self. He denies pain, SI, HI, and AVH at this time. He rated his anxiety and depression a 0/10. Pt told other nurse this shift that he "does not have a lot of time left." And that he is "tired". He is safe on the unit at this time. Q 15 minute safety checks in place. 1:1 continuous observation sitter in place.  ?

## 2022-01-16 NOTE — Progress Notes (Signed)
Patient is in bed sleeping. He wakes up intermittently coughing for about 2 minutes. Pt has a worsening cough and sounds like a barking seal. On Call NP contacted twice but did not answer. Pt is safe on the unit at this time. Q 15 minute safety checks in place. 1:1 continuous observation sitter in place.  ?

## 2022-01-16 NOTE — Progress Notes (Signed)
Patient is repeatedly coughing in the dayroom. Dr. Louis Meckel notified. Patient returned to his room and spoke on the phone with his niece. Patient remains on 1:1 for safety. ?

## 2022-01-16 NOTE — BH Assessment (Signed)
Writer completed two safety rounds during the hours of 17:00-17:20. ? ?Patient was in the dayroom. ?

## 2022-01-16 NOTE — Progress Notes (Signed)
Jay Webster ? ?01/16/2022 12:04 PM ?Jay Webster  ?MRN:  [id number] ?Subjective: Jay Webster is seen on rounds today.  Recommendations from PT and OT were long-term institutional care without follow-up therapy and frequent or constant supervision and assistance and a rolling walker.  Basically, memory care unit.  He remains pleasantly confused and oriented x1.  He is on a one-to-one for safety.  He is eating and sleeping well.  He is compliant with his medications and there is no evidence of any side effects.  He seems to be in good spirits. ? ?Principal Problem: Major depressive disorder, recurrent episode, severe ([place]) ?Diagnosis: Principal Problem: ?  Major depressive disorder, recurrent episode, severe ([place]) ?Active Problems: ?  Cognitive and behavioral changes ? ?Total Time spent with patient: 15 minutes ? ?Past Psychiatric History: None ? ?Past Medical History:  ?Past Medical History:  ?Diagnosis Date  ? Arthritis   ? OA AND PAIN LEFT HIP AND OTHER JOINT PAINS  ? Cancer [facility])   ? THYROID CANCER - HAD THYROID REMOVED  ? History of kidney stones   ? History of shingles   ? RESOLVED  ? Hypertension   ? Hypothyroidism   ? Low back pain   ? Macular degeneration of both eyes   ? RBBB (right bundle branch block with left anterior fascicular block)   ? Seasonal allergies   ? Thyroid disease   ? Uric acid renal calculus 06/20/2007  ? Qualifier: Diagnosis of  By: Rogue Bussing CMA, Jacqualynn    ?  ?Past Surgical History:  ?Procedure Laterality Date  ? APPENDECTOMY  1948  ? CATARACT EXTRACTION Right 08/27/2019  ? COLONOSCOPY    ? Elbow Radical Reduction  1973  ? IR IMAGING GUIDED PORT INSERTION  05/05/2018  ? IR REMOVAL TUN ACCESS W/ PORT W/O FL MOD SED  10/18/2018  ? [place]  ? MASS EXCISION N/A 03/11/2018  ? Procedure: EXCISION MASS OF PERINEUM;  Surgeon: Armandina Gemma, MD;  Location: [facility];  Service: General;  Laterality: N/A;  ? REMOVAL OF FIRST RIB   [place]  ? FOR COMPRESSED VEIN BETWEEN 1  ST RIB AND COLLARBONE  ? Rib removed, 1st  1994  ? ROTATOR CUFF REPAIR  2009  ? THYROIDECTOMY  2010  ? TOTAL HIP ARTHROPLASTY Left 05/31/2013  ? Procedure: LEFT TOTAL HIP ARTHROPLASTY ANTERIOR APPROACH;  Surgeon: Gearlean Alf, MD;  Location: [facility];  Service: Orthopedics;  Laterality: Left;  ? ?Family History:  ?Family History  ?Problem Relation Age of Onset  ? Heart disease Mother   ?     passed in 80s  ? Diabetes Mother   ? Alcohol abuse Father   ?     passed from this  ? Parkinson's disease Sister   ? Diabetes Brother   ? Dementia Sister   ? Cancer Brother   ?     sounds like bone cancer- non healing knee injury  ? ? ?Social History:  ?Social History  ? ?Substance and Sexual Activity  ?Alcohol Use Not Currently  ? Alcohol/week: 0.0 - 1.0 standard drinks  ?   ?Social History  ? ?Substance and Sexual Activity  ?Drug Use No  ?  ?Social History  ? ?Socioeconomic History  ? Marital status: Widowed  ?  Spouse name: Not on file  ? Number of children: Not on file  ? Years of education: Not on file  ? Highest education level: Not on file  ?Occupational  History  ? Occupation: retired  ?Tobacco Use  ? Smoking status: Former  ?  Types: Cigarettes  ?  Quit date: 02/22/1979  ?  Years since quitting: 42.9  ? Smokeless tobacco: Never  ?Vaping Use  ? Vaping Use: Never used  ?Substance and Sexual Activity  ? Alcohol use: Not Currently  ?  Alcohol/week: 0.0 - 1.0 standard drinks  ? Drug use: No  ? Sexual activity: Not Currently  ?Other Topics Concern  ? Not on file  ?Social History Narrative  ? Lives alone and 1 cat. Divorced from mongomous long term partner and eventual husband. No children from prior relationships.   ?   ? Retired Investment banker, corporate  ?   ? Hobbies: socializing, used to play tennis  ? ?Social Determinants of Health  ? ?Financial Resource Strain: Not on file  ?Food Insecurity: Not on file  ?Transportation Needs: Not on file  ?Physical Activity: Not on file  ?Stress: Not on file  ?Social Connections: Not on file   ? ?Additional Social History:  ?  ?  ?  ?  ?  ?  ?  ?  ?  ?  ?  ? ?Sleep: Good ? ?Appetite:  Good ? ?Current Medications: ?Current Facility-Administered Medications  ?Medication Dose Route Frequency Provider Last Rate Last Admin  ? alum & mag hydroxide-simeth (MAALOX/MYLANTA) 200-200-20 MG/5ML suspension 30 mL  30 mL Oral Q4H PRN Parks Ranger, DO      ? buPROPion (WELLBUTRIN XL) 24 hr tablet 150 mg  150 mg Oral Daily Parks Ranger, DO   150 mg at 01/16/22 0932  ? donepezil (ARICEPT) tablet 5 mg  5 mg Oral QHS Parks Ranger, DO   5 mg at 01/15/22 2113  ? haloperidol (HALDOL) tablet 0.5 mg  0.5 mg Oral [id number] Parks Ranger, DO   0.5 mg at 01/16/22 9678  ? levothyroxine (SYNTHROID) tablet 125 mcg  125 mcg Oral Daily Parks Ranger, DO   125 mcg at 01/16/22 0547  ? LORazepam (ATIVAN) tablet 0.5 mg  0.5 mg Oral Q4H PRN Parks Ranger, DO      ? magnesium hydroxide (MILK OF MAGNESIA) suspension 30 mL  30 mL Oral Daily PRN Parks Ranger, DO      ? mirtazapine (REMERON) tablet 45 mg  45 mg Oral QHS Clapacs, Madie Reno, MD   45 mg at 01/15/22 2114  ? OLANZapine (ZYPREXA) tablet 5 mg  5 mg Oral Q6H PRN Parks Ranger, DO      ? traZODone (DESYREL) tablet 100 mg  100 mg Oral QHS PRN Parks Ranger, DO   100 mg at 01/15/22 2114  ? ? ?Lab Results: No results found for this or any previous visit (from the past 48 hour(s)). ? ?Blood Alcohol level:  ?Lab Results  ?Component Value Date  ? ETH <10 01/05/2022  ? ? ?Metabolic Disorder Labs: ?Lab Results  ?Component Value Date  ? HGBA1C 5.6 01/10/2022  ? MPG 114.02 01/10/2022  ? MPG 117 08/20/2020  ? ?No results found for: PROLACTIN ?Lab Results  ?Component Value Date  ? CHOL 146 01/10/2022  ? TRIG 52 01/10/2022  ? HDL 62 01/10/2022  ? CHOLHDL 2.4 01/10/2022  ? VLDL 10 01/10/2022  ? [place] 74 01/10/2022  ? [place] 69 02/19/2020  ? ? ?Physical Findings: ?AIMS: Facial and Oral Movements ?Muscles of  Facial Expression: None, normal ?Lips and Perioral Area: None, normal ?Jaw: None, normal ?Tongue: None, normal,Extremity Movements ?Upper (  arms, wrists, hands, fingers): None, normal ?Lower (legs, knees, ankles, toes): None, normal, Trunk Movements ?Neck, shoulders, hips: None, normal, Overall Severity ?Severity of abnormal movements (highest score from questions above): None, normal ?Incapacitation due to abnormal movements: None, normal ?Patient's awareness of abnormal movements (rate only patient's report): No Awareness, Dental Status ?Current problems with teeth and/or dentures?: No ?Does patient usually wear dentures?: No  ?CIWA:    ?COWS:    ? ?Musculoskeletal: ?Strength & Muscle Tone: within normal limits ?Gait & Station: unsteady ?Patient leans: N/A ? ?Psychiatric Specialty Exam: ? ?Presentation  ?General Appearance: No data recorded ?Eye Contact:No data recorded ?Speech:No data recorded ?Speech Volume:No data recorded ?Handedness:No data recorded ? ?Mood and Affect  ?Mood:No data recorded ?Affect:No data recorded ? ?Thought Process  ?Thought Processes:No data recorded ?Descriptions of Associations:No data recorded ?Orientation:No data recorded ?Thought Content:No data recorded ?History of Schizophrenia/Schizoaffective disorder:No data recorded ?Duration of Psychotic Symptoms:No data recorded ?Hallucinations:No data recorded ?Ideas of Reference:No data recorded ?Suicidal Thoughts:No data recorded ?Homicidal Thoughts:No data recorded ? ?Sensorium  ?Memory:No data recorded ?Judgment:No data recorded ?Insight:No data recorded ? ?Executive Functions  ?Concentration:No data recorded ?Attention Span:No data recorded ?Recall:No data recorded ?[facility] of [place] recorded ?Language:No data recorded ? ?Psychomotor Activity  ?Psychomotor Activity:No data recorded ? ?Assets  ?Assets:No data recorded ? ?Sleep  ?Sleep:No data recorded ? ? ?Physical Exam: ?Physical Exam ?Vitals and nursing Webster reviewed.   ?Constitutional:   ?   Appearance: Normal appearance. He is normal weight.  ?Neurological:  ?   General: No focal deficit present.  ?   Mental Status: He is alert and oriented to person, place, and time.  ?Psychiatr

## 2022-01-16 NOTE — Progress Notes (Signed)
Patient is in his bedroom asleep. He wakes up intermittently yelling in his sleep swearing. Pt gets up during the night to use the bathroom. He is safe on the unit at this time. Q 15 minute safety checks in place. 1:1 continuous observation sitter in place. ?

## 2022-01-16 NOTE — Progress Notes (Signed)
Patient is sitting in the dayroom eating lunch. Patient is calm. Patient remains on 1:1 for safety. ?

## 2022-01-17 MED ORDER — ACETAMINOPHEN 325 MG PO TABS
650.0000 mg | ORAL_TABLET | Freq: Four times a day (QID) | ORAL | Status: DC | PRN
  Administered 2022-01-17 – 2022-01-20 (×3): 650 mg via ORAL
  Filled 2022-01-17 (×3): qty 2

## 2022-01-17 NOTE — Progress Notes (Signed)
Patient remain alert, confused and forgetful. Requires redirections. Continue on 1:1 with sitter by his side. Ate lunch in the day room among peers and tolerated well. Q15 minute safety checks continue. ?

## 2022-01-17 NOTE — Progress Notes (Addendum)
Patient is seen in the dayroom at the beginning of the shift. He is AAOx1 to himself only. He denies pain, SI, HI, AVH, anxiety, and depression. He is medication compliant. He is safe on the unit at this time. Q 15 minute safety checks in place. 1:1 continuous observation sitter in place. ?

## 2022-01-17 NOTE — Progress Notes (Signed)
Patient is in bed asleep. He c/o left knee pain at 0440. Rated 10/10. Tylenol given as per [facility] orders with desired effect. Pt is safe on the unit at this time. Q 15 minute safety checks in place. 1:1 continuous observation sitter in place. ?

## 2022-01-17 NOTE — Group Note (Signed)
[facility] LCSW Group Therapy Note ? ? ? ?Group Date: 01/17/2022 ?Start Time: 1300 ?End Time: 1400 ? ?Type of Therapy and Topic:  Group Therapy:  Overcoming Obstacles ? ?Participation Level:  [facility] PARTICIPATION LEVEL: Did Not Attend ? ?Mood: X ? ?Description of Group:   ?In this group patients will be encouraged to explore what they see as obstacles to their own wellness and recovery. They will be guided to discuss their thoughts, feelings, and behaviors related to these obstacles. The group will process together ways to cope with barriers, with attention given to specific choices patients can make. Each patient will be challenged to identify changes they are motivated to make in order to overcome their obstacles. This group will be process-oriented, with patients participating in exploration of their own experiences as well as giving and receiving support and challenge from other group members. ? ?Therapeutic Goals: ?1. Patient will identify personal and current obstacles as they relate to admission. ?2. Patient will identify barriers that currently interfere with their wellness or overcoming obstacles.  ?3. Patient will identify feelings, thought process and behaviors related to these barriers. ?4. Patient will identify two changes they are willing to make to overcome these obstacles:  ? ? ?Summary of Patient Progress: Due to an influx of patients on the BMU units, group was not held.  ? ? ? ? ?Therapeutic Modalities:   ?Cognitive Behavioral Therapy ?Solution Focused Therapy ?Motivational Interviewing ?Relapse Prevention Therapy ? ? ?Kenna Gilbert Zhana Jeangilles, LCSWA ?

## 2022-01-17 NOTE — Plan of Care (Signed)
Patient remain alert with confusion. Calm and cooperative during assessment. Patient denies pain or discomfort at this time. Denies SI, HI, AVH. Denies anxiety and depression. Ate breakfast in the day room among peers with good appetite. Compliant with due medication. Ambulate back to his room after breakfast with 1:1 sitter within reach. Assisted with shower, complete linen change. Patient is currently in his room in bed resting in no apparent distress at this time. Q 15 minute safety checks continue. ? ?Problem: Education: ?Goal: Knowledge of [facility] Education information/materials will improve ?Outcome: Progressing ?Goal: Emotional status will improve ?Outcome: Progressing ?Goal: Mental status will improve ?Outcome: Progressing ?Goal: Verbalization of understanding the information provided will improve ?Outcome: Progressing ?  ?Problem: Activity: ?Goal: Interest or engagement in activities will improve ?Outcome: Progressing ?Goal: Sleeping patterns will improve ?Outcome: Progressing ?  ?Problem: Coping: ?Goal: Ability to verbalize frustrations and anger appropriately will improve ?Outcome: Progressing ?Goal: Ability to demonstrate self-control will improve ?Outcome: Progressing ?  ?Problem: Health Behavior/Discharge Planning: ?Goal: Identification of resources available to assist in meeting health care needs will improve ?Outcome: Progressing ?Goal: Compliance with treatment plan for underlying cause of condition will improve ?Outcome: Progressing ?  ?Problem: Physical Regulation: ?Goal: Ability to maintain clinical measurements within normal limits will improve ?Outcome: Progressing ?  ?Problem: Safety: ?Goal: Periods of time without injury will increase ?Outcome: Progressing ?  ?Problem: Education: ?Goal: Ability to make informed decisions regarding treatment will improve ?Outcome: Progressing ?  ?Problem: Health Behavior/Discharge Planning: ?Goal: Identification of resources available to assist in  meeting health care needs will improve ?Outcome: Progressing ?  ?Problem: Medication: ?Goal: Compliance with prescribed medication regimen will improve ?Outcome: Progressing ?  ?Problem: Self-Concept: ?Goal: Will verbalize positive feelings about self ?Outcome: Progressing ?  ?Problem: Activity: ?Goal: Will identify at least one activity in which they can participate ?Outcome: Progressing ?  ?Problem: Coping: ?Goal: Ability to identify and develop effective coping behavior will improve ?Outcome: Progressing ?Goal: Ability to interact with others will improve ?Outcome: Progressing ?Goal: Demonstration of participation in decision-making regarding own care will improve ?Outcome: Progressing ?Goal: Ability to use eye contact when communicating with others will improve ?Outcome: Progressing ?  ?Problem: Health Behavior/Discharge Planning: ?Goal: Identification of resources available to assist in meeting health care needs will improve ?Outcome: Progressing ?  ?Problem: Self-Concept: ?Goal: Will verbalize positive feelings about self ?Outcome: Progressing ?  ?

## 2022-01-17 NOTE — Progress Notes (Signed)
[facility] MD Progress Note ? ?01/17/2022 12:58 PM ?Jay Webster  ?MRN:  [id number] ?Subjective:  Patient remain alert with confusion. Calm and cooperative during assessment. Patient denies pain or discomfort at this time. Denies SI, HI, AVH. Denies anxiety and depression. Ate breakfast in the day room among peers with good appetite. Compliant with due medication. Ambulate back to his room after breakfast with 1:1 sitter within reach. Assisted with shower, complete linen change. ? Patient remains calm, pleasantly confused and is compliant with his meds. He remains on 1:1 assistance for safety and is sleeping well.  ?Principal Problem: Major depressive disorder, recurrent episode, severe ([place]) ?Diagnosis: Principal Problem: ?  Major depressive disorder, recurrent episode, severe ([place]) ?Active Problems: ?  Cognitive and behavioral changes ? ?Total Time spent with patient: 20 minutes ? ?Past Psychiatric History: Major depressive disorder, recurrent episode, severe  ?Major neurocognitive Disorder ? ?Past Medical History:  ?Past Medical History:  ?Diagnosis Date  ? Arthritis   ? OA AND PAIN LEFT HIP AND OTHER JOINT PAINS  ? Cancer [facility])   ? THYROID CANCER - HAD THYROID REMOVED  ? History of kidney stones   ? History of shingles   ? RESOLVED  ? Hypertension   ? Hypothyroidism   ? Low back pain   ? Macular degeneration of both eyes   ? RBBB (right bundle branch block with left anterior fascicular block)   ? Seasonal allergies   ? Thyroid disease   ? Uric acid renal calculus 06/20/2007  ? Qualifier: Diagnosis of  By: Rogue Bussing CMA, Jacqualynn    ?  ?Past Surgical History:  ?Procedure Laterality Date  ? APPENDECTOMY  1948  ? CATARACT EXTRACTION Right 08/27/2019  ? COLONOSCOPY    ? Elbow Radical Reduction  1973  ? IR IMAGING GUIDED PORT INSERTION  05/05/2018  ? IR REMOVAL TUN ACCESS W/ PORT W/O FL MOD SED  10/18/2018  ? [place]  ? MASS EXCISION N/A 03/11/2018  ? Procedure: EXCISION MASS OF PERINEUM;  Surgeon:  Armandina Gemma, MD;  Location: [facility];  Service: General;  Laterality: N/A;  ? REMOVAL OF FIRST RIB   [place]  ? FOR COMPRESSED VEIN BETWEEN 1 ST RIB AND COLLARBONE  ? Rib removed, 1st  1994  ? ROTATOR CUFF REPAIR  2009  ? THYROIDECTOMY  2010  ? TOTAL HIP ARTHROPLASTY Left 05/31/2013  ? Procedure: LEFT TOTAL HIP ARTHROPLASTY ANTERIOR APPROACH;  Surgeon: Gearlean Alf, MD;  Location: [facility];  Service: Orthopedics;  Laterality: Left;  ? ?Family History:  ?Family History  ?Problem Relation Age of Onset  ? Heart disease Mother   ?     passed in 41s  ? Diabetes Mother   ? Alcohol abuse Father   ?     passed from this  ? Parkinson's disease Sister   ? Diabetes Brother   ? Dementia Sister   ? Cancer Brother   ?     sounds like bone cancer- non healing knee injury  ? ?Family Psychiatric  History:  ?Social History:  ?Social History  ? ?Substance and Sexual Activity  ?Alcohol Use Not Currently  ? Alcohol/week: 0.0 - 1.0 standard drinks  ?   ?Social History  ? ?Substance and Sexual Activity  ?Drug Use No  ?  ?Social History  ? ?Socioeconomic History  ? Marital status: Widowed  ?  Spouse name: Not on file  ? Number of children: Not on file  ? Years of  education: Not on file  ? Highest education level: Not on file  ?Occupational History  ? Occupation: retired  ?Tobacco Use  ? Smoking status: Former  ?  Types: Cigarettes  ?  Quit date: 02/22/1979  ?  Years since quitting: 42.9  ? Smokeless tobacco: Never  ?Vaping Use  ? Vaping Use: Never used  ?Substance and Sexual Activity  ? Alcohol use: Not Currently  ?  Alcohol/week: 0.0 - 1.0 standard drinks  ? Drug use: No  ? Sexual activity: Not Currently  ?Other Topics Concern  ? Not on file  ?Social History Narrative  ? Lives alone and 1 cat. Divorced from mongomous long term partner and eventual husband. No children from prior relationships.   ?   ? Retired Investment banker, corporate  ?   ? Hobbies: socializing, used to play tennis  ? ?Social Determinants of Health  ? ?Financial Resource  Strain: Not on file  ?Food Insecurity: Not on file  ?Transportation Needs: Not on file  ?Physical Activity: Not on file  ?Stress: Not on file  ?Social Connections: Not on file  ? ?Additional Social History:  ?  ?  ?  ?  ?  ?  ?  ?  ?  ?  ?  ? ?Sleep: Fair ? ?Appetite:  Fair ? ?Current Medications: ?Current Facility-Administered Medications  ?Medication Dose Route Frequency Provider Last Rate Last Admin  ? acetaminophen (TYLENOL) tablet 650 mg  650 mg Oral Q6H PRN Deloria Lair, NP   650 mg at 01/17/22 0449  ? alum & mag hydroxide-simeth (MAALOX/MYLANTA) 200-200-20 MG/5ML suspension 30 mL  30 mL Oral Q4H PRN Parks Ranger, DO      ? buPROPion (WELLBUTRIN XL) 24 hr tablet 150 mg  150 mg Oral Daily Parks Ranger, DO   150 mg at 01/17/22 1001  ? donepezil (ARICEPT) tablet 5 mg  5 mg Oral QHS Parks Ranger, DO   5 mg at 01/16/22 2120  ? haloperidol (HALDOL) tablet 0.5 mg  0.5 mg Oral [id number] Parks Ranger, DO   0.5 mg at 01/17/22 9937  ? levothyroxine (SYNTHROID) tablet 125 mcg  125 mcg Oral Daily Parks Ranger, [place]   125 mcg at 01/17/22 0636  ? LORazepam (ATIVAN) tablet 0.5 mg  0.5 mg Oral Q4H PRN Parks Ranger, DO      ? magnesium hydroxide (MILK OF MAGNESIA) suspension 30 mL  30 mL Oral Daily PRN Parks Ranger, DO      ? mirtazapine (REMERON) tablet 45 mg  45 mg Oral QHS Clapacs, Madie Reno, MD   45 mg at 01/16/22 2120  ? OLANZapine (ZYPREXA) tablet 5 mg  5 mg Oral Q6H PRN Parks Ranger, DO      ? traZODone (DESYREL) tablet 100 mg  100 mg Oral QHS PRN Parks Ranger, DO   100 mg at 01/15/22 2114  ? ? ?Lab Results: No results found for this or any previous visit (from the past 48 hour(s)). ? ?Blood Alcohol level:  ?Lab Results  ?Component Value Date  ? ETH <10 01/05/2022  ? ? ?Metabolic Disorder Labs: ?Lab Results  ?Component Value Date  ? HGBA1C 5.6 01/10/2022  ? MPG 114.02 01/10/2022  ? MPG 117 08/20/2020  ? ?No results found for:  PROLACTIN ?Lab Results  ?Component Value Date  ? CHOL 146 01/10/2022  ? TRIG 52 01/10/2022  ? HDL 62 01/10/2022  ? CHOLHDL 2.4 01/10/2022  ? VLDL 10 01/10/2022  ?  [place] 74 01/10/2022  ? [place] 69 02/19/2020  ? ? ?Physical Findings: ?AIMS: Facial and Oral Movements ?Muscles of Facial Expression: None, normal ?Lips and Perioral Area: None, normal ?Jaw: None, normal ?Tongue: None, normal,Extremity Movements ?Upper (arms, wrists, hands, fingers): None, normal ?Lower (legs, knees, ankles, toes): None, normal, Trunk Movements ?Neck, shoulders, hips: None, normal, Overall Severity ?Severity of abnormal movements (highest score from questions above): None, normal ?Incapacitation due to abnormal movements: None, normal ?Patient's awareness of abnormal movements (rate only patient's report): No Awareness, Dental Status ?Current problems with teeth and/or dentures?: No ?Does patient usually wear dentures?: No  ?CIWA:    ?COWS:    ? ?Musculoskeletal: ?Strength & Muscle Tone: within normal limits ?Gait & Station: unsteady ?Patient leans: Front ? ?Psychiatric Specialty Exam: ? ?Presentation  ?General Appearance: No data recorded ?Eye Contact:No data recorded ?Speech:No data recorded ?Speech Volume:No data recorded ?Handedness:No data recorded ? ?Mood and Affect  ?Mood:No data recorded ?Affect:No data recorded ? ?Thought Process  ?Thought Processes:No data recorded ?Descriptions of Associations:No data recorded ?Orientation:No data recorded ?Thought Content:No data recorded ?History of Schizophrenia/Schizoaffective disorder:No data recorded ?Duration of Psychotic Symptoms:No data recorded ?Hallucinations:No data recorded ?Ideas of Reference:No data recorded ?Suicidal Thoughts:No data recorded ?Homicidal Thoughts:No data recorded ? ?Sensorium  ?Memory:No data recorded ?Judgment:No data recorded ?Insight:No data recorded ? ?Executive Functions  ?Concentration:No data recorded ?Attention Span:No data recorded ?Recall:No data  recorded ?[employer] of [place] recorded ?Language:No data recorded ? ?Psychomotor Activity  ?Psychomotor Activity:No data recorded ? ?Assets  ?Assets:No data recorded ? ?Sleep  ?Sleep:No data recorded ? ?

## 2022-01-17 NOTE — Progress Notes (Signed)
Patient remain alert and confused. Continue to require redirection. Assisted with incontinent care, peri care provided. Patient is currently in the room among peers in no apparent distress. Close observation continue. Patient remain safe on the unit with Q 15 minute safety checks. ?

## 2022-01-17 NOTE — Progress Notes (Signed)
Patient assisted out of bed to the day room for breakfast in no apparent distress. Consume 100% of breakfast and tolerated well. 1:1 sitter within easy reach. Q 15 minute safety check continue. ?

## 2022-01-17 NOTE — Progress Notes (Signed)
Patient is seen in his bedroom sleeping. He gets up for snack and eats in the dayroom. He is AAOx1. He denied SI, HI, AVH, anxiety, depression, and pain. He is medication complaint. Pt is safe on the unit at this time. Q 15 minute safety checks in place. 1:1 continuous observation sitter in place.  ?

## 2022-01-17 NOTE — Progress Notes (Signed)
Patient seen in bed asleep. He is safe on the unit at this time. Q 15 minute safety checks in place. 1:1 continuous observation sitter in place.  ?

## 2022-01-18 NOTE — Progress Notes (Signed)
Patient ate lunch in the dayroom then went back to bed. Currently sleeping: no sign of distress.  ?

## 2022-01-18 NOTE — Plan of Care (Signed)
Patient came to the dayroom for breakfast. Ate a moderate amount of his breakfast then went back to bed. Currently resting. No sign of distress. Safety precautions maintained on 1:1 protocol.  ?

## 2022-01-18 NOTE — Progress Notes (Signed)
Pt complained of headache; tylenol administered. ?

## 2022-01-18 NOTE — Progress Notes (Signed)
[facility] MD Progress Note ? ?01/18/2022 11:35 AM ?Jay Webster  ?MRN:  [id number] ?Subjective:  Patient remains calm and co-operative on the floor; he is taking his meds regularly and remains on 1:1 for safety. He is polite during interactions with scattered thoughts and confusion.  ?Principal Problem: Major depressive disorder, recurrent episode, severe ([place]) ?Diagnosis: Principal Problem: ?  Major depressive disorder, recurrent episode, severe ([place]) ?Active Problems: ?  Cognitive and behavioral changes ? ?Total Time spent with patient: 20 minutes ? ?Past Psychiatric History: Major depressive disorder, recurrent episode, severe  ?Major neurocognitive Disorder ?  ? ?Past Medical History:  ?Past Medical History:  ?Diagnosis Date  ? Arthritis   ? OA AND PAIN LEFT HIP AND OTHER JOINT PAINS  ? Cancer [facility])   ? THYROID CANCER - HAD THYROID REMOVED  ? History of kidney stones   ? History of shingles   ? RESOLVED  ? Hypertension   ? Hypothyroidism   ? Low back pain   ? Macular degeneration of both eyes   ? RBBB (right bundle branch block with left anterior fascicular block)   ? Seasonal allergies   ? Thyroid disease   ? Uric acid renal calculus 06/20/2007  ? Qualifier: Diagnosis of  By: Rogue Bussing CMA, Jacqualynn    ?  ?Past Surgical History:  ?Procedure Laterality Date  ? APPENDECTOMY  1948  ? CATARACT EXTRACTION Right 08/27/2019  ? COLONOSCOPY    ? Elbow Radical Reduction  1973  ? IR IMAGING GUIDED PORT INSERTION  05/05/2018  ? IR REMOVAL TUN ACCESS W/ PORT W/O FL MOD SED  10/18/2018  ? [place]  ? MASS EXCISION N/A 03/11/2018  ? Procedure: EXCISION MASS OF PERINEUM;  Surgeon: Armandina Gemma, MD;  Location: [facility];  Service: General;  Laterality: N/A;  ? REMOVAL OF FIRST RIB   [place]  ? FOR COMPRESSED VEIN BETWEEN 1 ST RIB AND COLLARBONE  ? Rib removed, 1st  1994  ? ROTATOR CUFF REPAIR  2009  ? THYROIDECTOMY  2010  ? TOTAL HIP ARTHROPLASTY Left 05/31/2013  ? Procedure: LEFT TOTAL HIP ARTHROPLASTY ANTERIOR  APPROACH;  Surgeon: Gearlean Alf, MD;  Location: [facility];  Service: Orthopedics;  Laterality: Left;  ? ?Family History:  ?Family History  ?Problem Relation Age of Onset  ? Heart disease Mother   ?     passed in 70s  ? Diabetes Mother   ? Alcohol abuse Father   ?     passed from this  ? Parkinson's disease Sister   ? Diabetes Brother   ? Dementia Sister   ? Cancer Brother   ?     sounds like bone cancer- non healing knee injury  ? ?Family Psychiatric  History:  ?Social History:  ?Social History  ? ?Substance and Sexual Activity  ?Alcohol Use Not Currently  ? Alcohol/week: 0.0 - 1.0 standard drinks  ?   ?Social History  ? ?Substance and Sexual Activity  ?Drug Use No  ?  ?Social History  ? ?Socioeconomic History  ? Marital status: Widowed  ?  Spouse name: Not on file  ? Number of children: Not on file  ? Years of education: Not on file  ? Highest education level: Not on file  ?Occupational History  ? Occupation: retired  ?Tobacco Use  ? Smoking status: Former  ?  Types: Cigarettes  ?  Quit date: 02/22/1979  ?  Years since quitting: 42.9  ? Smokeless tobacco: Never  ?  Vaping Use  ? Vaping Use: Never used  ?Substance and Sexual Activity  ? Alcohol use: Not Currently  ?  Alcohol/week: 0.0 - 1.0 standard drinks  ? Drug use: No  ? Sexual activity: Not Currently  ?Other Topics Concern  ? Not on file  ?Social History Narrative  ? Lives alone and 1 cat. Divorced from mongomous long term partner and eventual husband. No children from prior relationships.   ?   ? Retired Investment banker, corporate  ?   ? Hobbies: socializing, used to play tennis  ? ?Social Determinants of Health  ? ?Financial Resource Strain: Not on file  ?Food Insecurity: Not on file  ?Transportation Needs: Not on file  ?Physical Activity: Not on file  ?Stress: Not on file  ?Social Connections: Not on file  ? ?Additional Social History:  ?  ?  ?  ?  ?  ?  ?  ?  ?  ?  ?  ? ?Sleep: Fair ? ?Appetite:  Fair ? ?Current Medications: ?Current Facility-Administered  Medications  ?Medication Dose Route Frequency Provider Last Rate Last Admin  ? acetaminophen (TYLENOL) tablet 650 mg  650 mg Oral Q6H PRN Deloria Lair, NP   650 mg at 01/17/22 0449  ? alum & mag hydroxide-simeth (MAALOX/MYLANTA) 200-200-20 MG/5ML suspension 30 mL  30 mL Oral Q4H PRN Parks Ranger, DO      ? buPROPion (WELLBUTRIN XL) 24 hr tablet 150 mg  150 mg Oral Daily Parks Ranger, DO   150 mg at 01/18/22 6578  ? donepezil (ARICEPT) tablet 5 mg  5 mg Oral QHS Parks Ranger, DO   5 mg at 01/17/22 2115  ? haloperidol (HALDOL) tablet 0.5 mg  0.5 mg Oral [id number] Parks Ranger, DO   0.5 mg at 01/18/22 0818  ? levothyroxine (SYNTHROID) tablet 125 mcg  125 mcg Oral Daily Parks Ranger, [place]   125 mcg at 01/18/22 2841  ? LORazepam (ATIVAN) tablet 0.5 mg  0.5 mg Oral Q4H PRN Parks Ranger, DO      ? magnesium hydroxide (MILK OF MAGNESIA) suspension 30 mL  30 mL Oral Daily PRN Parks Ranger, DO      ? mirtazapine (REMERON) tablet 45 mg  45 mg Oral QHS Clapacs, Madie Reno, MD   45 mg at 01/17/22 2115  ? OLANZapine (ZYPREXA) tablet 5 mg  5 mg Oral Q6H PRN Parks Ranger, DO      ? traZODone (DESYREL) tablet 100 mg  100 mg Oral QHS PRN Parks Ranger, DO   100 mg at 01/15/22 2114  ? ? ?Lab Results: No results found for this or any previous visit (from the past 48 hour(s)). ? ?Blood Alcohol level:  ?Lab Results  ?Component Value Date  ? ETH <10 01/05/2022  ? ? ?Metabolic Disorder Labs: ?Lab Results  ?Component Value Date  ? HGBA1C 5.6 01/10/2022  ? MPG 114.02 01/10/2022  ? MPG 117 08/20/2020  ? ?No results found for: PROLACTIN ?Lab Results  ?Component Value Date  ? CHOL 146 01/10/2022  ? TRIG 52 01/10/2022  ? HDL 62 01/10/2022  ? CHOLHDL 2.4 01/10/2022  ? VLDL 10 01/10/2022  ? [place] 74 01/10/2022  ? [place] 69 02/19/2020  ? ? ?Physical Findings: ?AIMS: Facial and Oral Movements ?Muscles of Facial Expression: None, normal ?Lips and  Perioral Area: None, normal ?Jaw: None, normal ?Tongue: None, normal,Extremity Movements ?Upper (arms, wrists, hands, fingers): None, normal ?Lower (legs, knees, ankles, toes): None, normal,  Trunk Movements ?Neck, shoulders, hips: None, normal, Overall Severity ?Severity of abnormal movements (highest score from questions above): None, normal ?Incapacitation due to abnormal movements: None, normal ?Patient's awareness of abnormal movements (rate only patient's report): No Awareness, Dental Status ?Current problems with teeth and/or dentures?: No ?Does patient usually wear dentures?: No  ?CIWA:    ?COWS:    ? ?Musculoskeletal: ?Strength & Muscle Tone: within normal limits ?Gait & Station: unsteady ?Patient leans: N/A ? ?Psychiatric Specialty Exam: ? ?Presentation  ?General Appearance: Appropriate for Environment; Casual; Neat ? ?Eye Contact:Fair ? ?Speech:No data recorded ?Speech Volume:Decreased ? ?Handedness:Right ? ? ?Mood and Affect  ?Mood:Anxious ? ?Affect:Appropriate ? ? ?Thought Process  ?Thought Processes:Linear ? ?Descriptions of Associations:Circumstantial ? ?Orientation:None ? ?Thought Content:Scattered ? ?History of Schizophrenia/Schizoaffective disorder:No data recorded ?Duration of Psychotic Symptoms:No data recorded ?Hallucinations:Hallucinations: None ? ?Ideas of Reference:None ? ?Suicidal Thoughts:Suicidal Thoughts: No ? ?Homicidal Thoughts:Homicidal Thoughts: No ? ? ?Sensorium  ?Memory:Remote Poor; Immediate Poor ? ?Judgment:Fair ? ?Insight:Fair ? ? ?Executive Functions  ?Concentration:Fair ? ?Attention Span:Poor ? ?Recall:Poor ? ?Fund of Knowledge:Poor ? ?Language:No data recorded ? ?Psychomotor Activity  ?Psychomotor Activity:Psychomotor Activity: Decreased ? ? ?Assets  ?Assets:No data recorded ? ?Sleep  ?Sleep:No data recorded ? ? ?Physical Exam: ?Physical Exam ?Constitutional:   ?   Appearance: Normal appearance. He is normal weight.  ?HENT:  ?   Head: Normocephalic and atraumatic.  ?   Right  Ear: External ear normal.  ?   Left Ear: External ear normal.  ?   Nose: Nose normal.  ?   Mouth/Throat:  ?   Mouth: Mucous membranes are moist.  ?   Pharynx: Oropharynx is clear.  ?Eyes:  ?   Extraocular Movements: Ex

## 2022-01-18 NOTE — Progress Notes (Signed)
Jessie woke up and came to the dayroom to eat dinner. Cooperative and pleasant. Returned to his room after dinner and currently in room, sitter at  bedside.  ?

## 2022-01-18 NOTE — Progress Notes (Signed)
Pt lying in bed with sitter in room. Pt calm, cooperative. Pt states "I think I'm doing okay." Pt denies pain and SI/HI/VH at this time; however he states "I hear 11/22/1931. That's when I was born [birth year]." He says that he just hears those numbers. He denies command hallucinations. He states that he sleeps "well" and describes his appetite as "too good". He currently denies depression but reports anxiety due to being "worried about what's going to happen to me. I think I can take care of my money but the bank won't give me any information, so I don't know my balance." No acute distress noted.  ?

## 2022-01-18 NOTE — Group Note (Signed)
LCSW Group Therapy Note ? ?Group Date: 01/18/2022 ?Start Time: 3875 ?End Time: 6433 ? ? ?Type of Therapy and Topic:  Group Therapy - Healthy vs Unhealthy Coping Skills ? ?Participation Level:  Did Not Attend  ? ?Description of Group ?The focus of this group was to determine what unhealthy coping techniques typically are used by group members and what healthy coping techniques would be helpful in coping with various problems. Patients were guided in becoming aware of the differences between healthy and unhealthy coping techniques. Patients were asked to identify 2-3 healthy coping skills they would like to learn to use more effectively. ? ?Therapeutic Goals ?Patients learned that coping is what human beings do all day long to deal with various situations in their lives ?Patients defined and discussed healthy vs unhealthy coping techniques ?Patients identified their preferred coping techniques and identified whether these were healthy or unhealthy ?Patients determined 2-3 healthy coping skills they would like to become more familiar with and use more often. ?Patients provided support and ideas to each other ? ? ?Summary of Patient Progress: Patient did not attend group despite encouraged participation. ? ? ? ?Therapeutic Modalities ?Cognitive Behavioral Therapy ?Motivational Interviewing ? ?Kenna Gilbert Porsche Noguchi, LCSWA ?01/18/2022  3:05 PM   ?

## 2022-01-18 NOTE — Progress Notes (Signed)
Patient is in the bed asleep. He is safe on the unit at this time. Q 15 minute safety checks in place. 1:1 continuous observation sitter in place. ?

## 2022-01-19 DIAGNOSIS — R4189 Other symptoms and signs involving cognitive functions and awareness: Secondary | ICD-10-CM | POA: Diagnosis not present

## 2022-01-19 DIAGNOSIS — R4689 Other symptoms and signs involving appearance and behavior: Secondary | ICD-10-CM | POA: Diagnosis not present

## 2022-01-19 MED ORDER — MODAFINIL 100 MG PO TABS
200.0000 mg | ORAL_TABLET | Freq: Every day | ORAL | Status: DC
  Administered 2022-01-20 – 2022-01-30 (×11): 200 mg via ORAL
  Filled 2022-01-19 (×11): qty 2

## 2022-01-19 NOTE — [facility] Group Notes (Signed)
Group: Chair Yoga 10A  ?Pt Refused to attend group, asleep in bedroom  ?

## 2022-01-19 NOTE — Progress Notes (Signed)
Pt lying in bed with sitter at bedside. Pt calm, cooperative, disoriented to situation; pt reoriented. He states "I feel fine but I'm not doing well. I seem to be held in some form of limbo." Pt denies pain and SI/HI/AVH at this time. He reports that he has been sleeping "well" and describes his appetite as "too good". He expresses feelings of anxiety and says "I'm worried about what's going to happen to me." He also expresses feelings of depression because "feeling like a captive makes me depressed. Other than that, everyone treats me fine." No acute distress noted.  ?

## 2022-01-19 NOTE — Progress Notes (Signed)
Recreation Therapy Notes ? ?  ?Date: 01/19/2022 ?  ?Time: 1:25pm  ?  ?Location: Courtyard   ?  ?Behavioral response: N/A ?  ?Intervention Topic: Leisure   ?  ?Discussion/Intervention: ?Patient refused to attend group.  ?  ?Clinical Observations/Feedback:  ?Patient refused to attend group.  ?  ?Shakedra Beam LRT/CTRS ?  ? ? ? ? ? ? ? ?[place] Joshi ?01/19/2022 2:26 PM ?

## 2022-01-19 NOTE — Progress Notes (Signed)
Patient out in dayroom for dinner. Continues to be med compliant and cooperative. Denies any concerns/complaints. Absent from falls/injuries. 1 to 1 sitter continues at bedside.  ? ?

## 2022-01-19 NOTE — Plan of Care (Signed)
?  Problem: Health Behavior/Discharge Planning: ?Goal: Compliance with treatment plan for underlying cause of condition will improve ?Outcome: Progressing ?  ?Problem: Safety: ?Goal: Periods of time without injury will increase ?Outcome: Progressing ?  ?Problem: Medication: ?Goal: Compliance with prescribed medication regimen will improve ?Outcome: Progressing ?  ?

## 2022-01-19 NOTE — Progress Notes (Signed)
Patient hesitant to get up for lunch. Pt stated "I just want to sleep and dont feel like doing anything." After some encouragement patient toileted and assisted to day area to eat lunch by RN and sitter. Patient remains cooperative with no aggressive/violent behaviors or injuries/falls.  ?

## 2022-01-19 NOTE — Progress Notes (Signed)
[facility] MD Progress Note ? ?01/19/2022 12:39 PM ?[name]  ?MRN:  [id number] ?Subjective: Jay Webster is lacking motivation to do anything including getting up out of bed and going to eat breakfast.  He needs a lot of prompting.  PT and OT both agree that he cannot live on his own.  He is compliant with his medications.  I think he could benefit from a more activating medication like Provigil. ? ?Patient hesitant to get up for lunch. Pt stated "I just want to sleep and dont feel like doing anything." After some encouragement patient toileted and assisted to day area to eat lunch by RN and sitter. Patient remains cooperative with no aggressive/violent behaviors or injuries/falls.   ?  ?  ?  ? ? ?Principal Problem: Major depressive disorder, recurrent episode, severe ([place]) ?Diagnosis: Principal Problem: ?  Major depressive disorder, recurrent episode, severe ([place]) ?Active Problems: ?  Cognitive and behavioral changes ? ?Total Time spent with patient: 15 minutes ? ?Past Psychiatric History: None ? ?Past Medical History:  ?Past Medical History:  ?Diagnosis Date  ? Arthritis   ? OA AND PAIN LEFT HIP AND OTHER JOINT PAINS  ? Cancer [facility])   ? THYROID CANCER - HAD THYROID REMOVED  ? History of kidney stones   ? History of shingles   ? RESOLVED  ? Hypertension   ? Hypothyroidism   ? Low back pain   ? Macular degeneration of both eyes   ? RBBB (right bundle branch block with left anterior fascicular block)   ? Seasonal allergies   ? Thyroid disease   ? Uric acid renal calculus 06/20/2007  ? Qualifier: Diagnosis of  By: Rogue Bussing CMA, Jacqualynn    ?  ?Past Surgical History:  ?Procedure Laterality Date  ? APPENDECTOMY  1948  ? CATARACT EXTRACTION Right 08/27/2019  ? COLONOSCOPY    ? Elbow Radical Reduction  1973  ? IR IMAGING GUIDED PORT INSERTION  05/05/2018  ? IR REMOVAL TUN ACCESS W/ PORT W/O FL MOD SED  10/18/2018  ? [place]  ? MASS EXCISION N/A 03/11/2018  ? Procedure: EXCISION MASS OF PERINEUM;  Surgeon:  Armandina Gemma, MD;  Location: [facility];  Service: General;  Laterality: N/A;  ? REMOVAL OF FIRST RIB   [place]  ? FOR COMPRESSED VEIN BETWEEN 1 ST RIB AND COLLARBONE  ? Rib removed, 1st  1994  ? ROTATOR CUFF REPAIR  2009  ? THYROIDECTOMY  2010  ? TOTAL HIP ARTHROPLASTY Left 05/31/2013  ? Procedure: LEFT TOTAL HIP ARTHROPLASTY ANTERIOR APPROACH;  Surgeon: Gearlean Alf, MD;  Location: [facility];  Service: Orthopedics;  Laterality: Left;  ? ?Family History:  ?Family History  ?Problem Relation Age of Onset  ? Heart disease Mother   ?     passed in 41s  ? Diabetes Mother   ? Alcohol abuse Father   ?     passed from this  ? Parkinson's disease Sister   ? Diabetes Brother   ? Dementia Sister   ? Cancer Brother   ?     sounds like bone cancer- non healing knee injury  ? ? ?Social History:  ?Social History  ? ?Substance and Sexual Activity  ?Alcohol Use Not Currently  ? Alcohol/week: 0.0 - 1.0 standard drinks  ?   ?Social History  ? ?Substance and Sexual Activity  ?Drug Use No  ?  ?Social History  ? ?Socioeconomic History  ? Marital status: Widowed  ?  Spouse name: Not on file  ? Number of children: Not on file  ? Years of education: Not on file  ? Highest education level: Not on file  ?Occupational History  ? Occupation: retired  ?Tobacco Use  ? Smoking status: Former  ?  Types: Cigarettes  ?  Quit date: 02/22/1979  ?  Years since quitting: 42.9  ? Smokeless tobacco: Never  ?Vaping Use  ? Vaping Use: Never used  ?Substance and Sexual Activity  ? Alcohol use: Not Currently  ?  Alcohol/week: 0.0 - 1.0 standard drinks  ? Drug use: No  ? Sexual activity: Not Currently  ?Other Topics Concern  ? Not on file  ?Social History Narrative  ? Lives alone and 1 cat. Divorced from mongomous long term partner and eventual husband. No children from prior relationships.   ?   ? Retired Investment banker, corporate  ?   ? Hobbies: socializing, used to play tennis  ? ?Social Determinants of Health  ? ?Financial Resource Strain: Not on file  ?Food  Insecurity: Not on file  ?Transportation Needs: Not on file  ?Physical Activity: Not on file  ?Stress: Not on file  ?Social Connections: Not on file  ? ?Additional Social History:  ?  ?  ?  ?  ?  ?  ?  ?  ?  ?  ?  ? ?Sleep: Good ? ?Appetite:  Fair ? ?Current Medications: ?Current Facility-Administered Medications  ?Medication Dose Route Frequency Provider Last Rate Last Admin  ? acetaminophen (TYLENOL) tablet 650 mg  650 mg Oral Q6H PRN Deloria Lair, NP   650 mg at 01/18/22 2032  ? alum & mag hydroxide-simeth (MAALOX/MYLANTA) 200-200-20 MG/5ML suspension 30 mL  30 mL Oral Q4H PRN Parks Ranger, DO      ? buPROPion (WELLBUTRIN XL) 24 hr tablet 150 mg  150 mg Oral Daily Parks Ranger, DO   150 mg at 01/19/22 7829  ? donepezil (ARICEPT) tablet 5 mg  5 mg Oral QHS Parks Ranger, DO   5 mg at 01/18/22 2123  ? haloperidol (HALDOL) tablet 0.5 mg  0.5 mg Oral [id number] Parks Ranger, DO   0.5 mg at 01/19/22 0754  ? levothyroxine (SYNTHROID) tablet 125 mcg  125 mcg Oral Daily Parks Ranger, [place]   125 mcg at 01/19/22 6578  ? LORazepam (ATIVAN) tablet 0.5 mg  0.5 mg Oral Q4H PRN Parks Ranger, DO      ? magnesium hydroxide (MILK OF MAGNESIA) suspension 30 mL  30 mL Oral Daily PRN Parks Ranger, DO      ? mirtazapine (REMERON) tablet 45 mg  45 mg Oral QHS Clapacs, Madie Reno, MD   45 mg at 01/18/22 2124  ? OLANZapine (ZYPREXA) tablet 5 mg  5 mg Oral Q6H PRN Parks Ranger, DO      ? traZODone (DESYREL) tablet 100 mg  100 mg Oral QHS PRN Parks Ranger, DO   100 mg at 01/15/22 2114  ? ? ?Lab Results: No results found for this or any previous visit (from the past 48 hour(s)). ? ?Blood Alcohol level:  ?Lab Results  ?Component Value Date  ? ETH <10 01/05/2022  ? ? ?Metabolic Disorder Labs: ?Lab Results  ?Component Value Date  ? HGBA1C 5.6 01/10/2022  ? MPG 114.02 01/10/2022  ? MPG 117 08/20/2020  ? ?No results found for: PROLACTIN ?Lab Results   ?Component Value Date  ? CHOL 146 01/10/2022  ? TRIG 52 01/10/2022  ?  HDL 62 01/10/2022  ? CHOLHDL 2.4 01/10/2022  ? VLDL 10 01/10/2022  ? [place] 74 01/10/2022  ? [place] 69 02/19/2020  ? ? ?Physical Findings: ?AIMS: Facial and Oral Movements ?Muscles of Facial Expression: None, normal ?Lips and Perioral Area: None, normal ?Jaw: None, normal ?Tongue: None, normal,Extremity Movements ?Upper (arms, wrists, hands, fingers): None, normal ?Lower (legs, knees, ankles, toes): None, normal, Trunk Movements ?Neck, shoulders, hips: None, normal, Overall Severity ?Severity of abnormal movements (highest score from questions above): None, normal ?Incapacitation due to abnormal movements: None, normal ?Patient's awareness of abnormal movements (rate only patient's report): No Awareness, Dental Status ?Current problems with teeth and/or dentures?: No ?Does patient usually wear dentures?: No  ?CIWA:    ?COWS:    ? ?Musculoskeletal: ?Strength & Muscle Tone: decreased ?Gait & Station: unsteady ?Patient leans: N/A ? ?Psychiatric Specialty Exam: ? ?Presentation  ?General Appearance: Appropriate for Environment; Casual; Neat ? ?Eye Contact:Fair ? ?Speech:Slow ? ?Speech Volume:Decreased ? ?Handedness:Right ? ? ?Mood and Affect  ?Mood:Anxious ? ?Affect:Constricted ? ? ?Thought Process  ?Thought Processes:Irrevelant ? ?Descriptions of Associations:Loose ? ?Orientation:Partial ? ?Thought Content:Illogical ? ?History of Schizophrenia/Schizoaffective disorder:No data recorded ?Duration of Psychotic Symptoms:No data recorded ?Hallucinations:Hallucinations: None ? ?Ideas of Reference:None ? ?Suicidal Thoughts:Suicidal Thoughts: No ? ?Homicidal Thoughts:Homicidal Thoughts: No ? ? ?Sensorium  ?Memory:Immediate Poor; Remote Poor ? ?Judgment:Fair ? ?Insight:Fair ? ? ?Executive Functions  ?Concentration:Fair ? ?Attention Span:Fair ? ?Recall:Fair ? ?[place] ? ?Language:Fair ? ? ?Psychomotor Activity  ?Psychomotor  Activity:Psychomotor Activity: Normal ? ? ?Assets  ?Assets:Resilience ? ? ?Sleep  ?Sleep:Sleep: Fair ? ? ? ?Physical Exam: ?Physical Exam ?Vitals and nursing note reviewed.  ?Constitutional:   ?   Appearance: Normal appearance.

## 2022-01-19 NOTE — Progress Notes (Signed)
Patient observed laying in bed sleeping calmly with sitter by bedside. Patient easily arousable and compliant with 8am medications. Denies pain at this time and wishes to continue sleeping until breakfast time.  ?

## 2022-01-19 NOTE — BH IP Treatment Plan (Signed)
Interdisciplinary Treatment and Diagnostic Plan Update ? ?01/19/2022 ?Time of Session: 0930 ?Jay Webster ?MRN: [id number] ? ?Principal Diagnosis: Major depressive disorder, recurrent episode, severe ([place]) ? ?Secondary Diagnoses: Principal Problem: ?  Major depressive disorder, recurrent episode, severe ([place]) ?Active Problems: ?  Cognitive and behavioral changes ? ? ?Current Medications:  ?Current Facility-Administered Medications  ?Medication Dose Route Frequency Provider Last Rate Last Admin  ? acetaminophen (TYLENOL) tablet 650 mg  650 mg Oral Q6H PRN Deloria Lair, NP   650 mg at 01/18/22 2032  ? alum & mag hydroxide-simeth (MAALOX/MYLANTA) 200-200-20 MG/5ML suspension 30 mL  30 mL Oral Q4H PRN Parks Ranger, DO      ? buPROPion (WELLBUTRIN XL) 24 hr tablet 150 mg  150 mg Oral Daily Parks Ranger, DO   150 mg at 01/19/22 9629  ? donepezil (ARICEPT) tablet 5 mg  5 mg Oral QHS Parks Ranger, DO   5 mg at 01/18/22 2123  ? haloperidol (HALDOL) tablet 0.5 mg  0.5 mg Oral [id number] Parks Ranger, DO   0.5 mg at 01/19/22 0754  ? levothyroxine (SYNTHROID) tablet 125 mcg  125 mcg Oral Daily Parks Ranger, [place]   125 mcg at 01/19/22 4401  ? LORazepam (ATIVAN) tablet 0.5 mg  0.5 mg Oral Q4H PRN Parks Ranger, DO      ? magnesium hydroxide (MILK OF MAGNESIA) suspension 30 mL  30 mL Oral Daily PRN Parks Ranger, DO      ? mirtazapine (REMERON) tablet 45 mg  45 mg Oral QHS Clapacs, Madie Reno, MD   45 mg at 01/18/22 2124  ? OLANZapine (ZYPREXA) tablet 5 mg  5 mg Oral Q6H PRN Parks Ranger, DO      ? traZODone (DESYREL) tablet 100 mg  100 mg Oral QHS PRN Parks Ranger, DO   100 mg at 01/15/22 2114  ? ?PTA Medications: ?Medications Prior to Admission  ?Medication Sig Dispense Refill Last Dose  ? doxycycline (VIBRAMYCIN) 100 MG capsule Take 1 capsule (100 mg total) by mouth 2 (two) times daily. (Patient not taking: Reported on 01/06/2022) 20  capsule 0   ? levothyroxine (SYNTHROID) 125 MCG tablet TAKE 1 TABLET BY MOUTH EVERY DAY (Patient taking differently: Take 125 mcg by mouth daily before breakfast.) 30 tablet 0   ? ? ?Patient Stressors: Health problems   ?Medication change or noncompliance   ? ?Patient Strengths: Financial means  ?Special hobby/interest  ?Supportive family/friends  ? ?Treatment Modalities: Medication Management, Group therapy, Case management,  ?1 to 1 session with clinician, Psychoeducation, Recreational therapy. ? ? ?Physician Treatment Plan for Primary Diagnosis: Major depressive disorder, recurrent episode, severe ([place]) ?Long Term Goal(s): Improvement in symptoms so as ready for discharge  ? ?Short Term Goals: Ability to identify changes in lifestyle to reduce recurrence of condition will improve ?Ability to verbalize feelings will improve ?Ability to disclose and discuss suicidal ideas ?Ability to demonstrate self-control will improve ?Ability to identify and develop effective coping behaviors will improve ?Ability to maintain clinical measurements within normal limits will improve ?Compliance with prescribed medications will improve ?Ability to identify triggers associated with substance abuse/mental health issues will improve ? ?Medication Management: Evaluate patient's response, side effects, and tolerance of medication regimen. ? ?Therapeutic Interventions: 1 to 1 sessions, Unit Group sessions and Medication administration. ? ?Evaluation of Outcomes: Progressing ? ?Physician Treatment Plan for Secondary Diagnosis: Principal Problem: ?  Major depressive disorder, recurrent episode, severe ([place]) ?Active Problems: ?  Cognitive and behavioral changes ? ?Long Term Goal(s): Improvement in symptoms so as ready for discharge  ? ?Short Term Goals: Ability to identify changes in lifestyle to reduce recurrence of condition will improve ?Ability to verbalize feelings will improve ?Ability to disclose and discuss suicidal ideas ?Ability  to demonstrate self-control will improve ?Ability to identify and develop effective coping behaviors will improve ?Ability to maintain clinical measurements within normal limits will improve ?Compliance with prescribed medications will improve ?Ability to identify triggers associated with substance abuse/mental health issues will improve    ? ?Medication Management: Evaluate patient's response, side effects, and tolerance of medication regimen. ? ?Therapeutic Interventions: 1 to 1 sessions, Unit Group sessions and Medication administration. ? ?Evaluation of Outcomes: Progressing ? ? ?RN Treatment Plan for Primary Diagnosis: Major depressive disorder, recurrent episode, severe ([place]) ?Long Term Goal(s): Knowledge of disease and therapeutic regimen to maintain health will improve ? ?Short Term Goals: Ability to remain free from injury will improve, Ability to verbalize frustration and anger appropriately will improve, Ability to demonstrate self-control, Ability to participate in decision making will improve, Ability to verbalize feelings will improve, Ability to disclose and discuss suicidal ideas, Ability to identify and develop effective coping behaviors will improve, and Compliance with prescribed medications will improve ? ?Medication Management: RN will administer medications as ordered by provider, will assess and evaluate patient's response and provide education to patient for prescribed medication. RN will report any adverse and/or side effects to prescribing provider. ? ?Therapeutic Interventions: 1 on 1 counseling sessions, Psychoeducation, Medication administration, Evaluate responses to treatment, Monitor vital signs and CBGs as ordered, Perform/monitor CIWA, COWS, AIMS and Fall Risk screenings as ordered, Perform wound care treatments as ordered. ? ?Evaluation of Outcomes: Progressing ? ? ?LCSW Treatment Plan for Primary Diagnosis: Major depressive disorder, recurrent episode, severe ([place]) ?Long Term  Goal(s): Safe transition to appropriate next level of care at discharge, Engage patient in therapeutic group addressing interpersonal concerns. ? ?Short Term Goals: Engage patient in aftercare planning with referrals and resources, Increase social support, Increase ability to appropriately verbalize feelings, Increase emotional regulation, Facilitate acceptance of mental health diagnosis and concerns, Facilitate patient progression through stages of change regarding substance use diagnoses and concerns, Identify triggers associated with mental health/substance abuse issues, and Increase skills for wellness and recovery ? ?Therapeutic Interventions: Assess for all discharge needs, 1 to 1 time with Education officer, museum, Explore available resources and support systems, Assess for adequacy in community support network, Educate family and significant other(s) on suicide prevention, Complete Psychosocial Assessment, Interpersonal group therapy. ? ?Evaluation of Outcomes: Progressing ? ? ?Progress in Treatment: ?Attending groups: No. ?Participating in groups: No. ?Taking medication as prescribed: Yes. ?Toleration medication: Yes. ?Family/Significant other contact made: Yes, individual(s) contacted:  SPE completed with Isaac Laud, niece, [phone] ?Patient understands diagnosis: No. ?Discussing patient identified problems/goals with staff: Yes. ?Medical problems stabilized or resolved: Yes. ?Denies suicidal/homicidal ideation: Yes. ?Issues/concerns per patient self-inventory: Yes. ?Other: none  ? ?New problem(s) identified: No, Describe:  No additional problems/concerns identified at this time.  ? ?New Short Term/Long Term Goal(s): Patient to work towards medication management for mood stabilization; development of comprehensive mental wellness plan. ? ?Patient Goals:  No additional goals identified at this time. Patient to continue to work towards original goals identified in initial treatment team meeting. CSW will remain  available to patient should they voice additional treatment goals.  ? ?Discharge Plan or Barriers: No psychosocial barriers identified at this time, patient to return to place of residence  when appropriat

## 2022-01-20 DIAGNOSIS — R4689 Other symptoms and signs involving appearance and behavior: Secondary | ICD-10-CM | POA: Diagnosis not present

## 2022-01-20 DIAGNOSIS — R4189 Other symptoms and signs involving cognitive functions and awareness: Secondary | ICD-10-CM | POA: Diagnosis not present

## 2022-01-20 NOTE — [facility] Group Notes (Signed)
[facility] Group Notes:  (Nursing/MHT/Case Management/Adjunct) ? ?Date:  01/20/2022  ?Time:  09:50 AM ? ?Type of Therapy:  Psychoeducational Skills ? ?Participation Level:  Did not atttend ? ?Participation Quality:   N/A ? ?Affect:   N/A ? ?Cognitive:   N/A ? ?Insight:  None ? ?Engagement in Group:   N/A ? ?Modes of Intervention:   N/A ? ?Summary of Progress/Problems: ?The pt did not attend group. ?Juliette Alcide ?01/20/2022, 10:36 AM ?

## 2022-01-20 NOTE — Progress Notes (Signed)
Physical Therapy Treatment ?Patient Details ?Name: Jay Webster ?MRN: [id number] ?DOB: Mar 05, 1932 ?Today's Date: 01/20/2022 ? ? ?History of Present Illness Pt is an 88 yo male that presented to the ED for complaints of Psychiatric Evaluation and IVC. PMH of HTN, low back pain, HOH, macular degeneration of both eyes, thyroid disease, removal of first rib, L THA. ? ?  ?PT Comments  ? ? Pt in bed and emotional today, more emotional during session. Reported "I should have gone with the DNR". Sitter present throughout as well. With encouragement and education pt agreeable to PT session. He was able to perform seated exercises, as well as a few standing exercises with constant verbal and visual cues. Sit <> stand x10 modI as well, no UE support. He ambulated ~141f with RW modI, no LOB noted. He did demonstrate poor carryover of understanding hand placement to maximize safety with RW. Overall the patient demonstrated mobility WFL, safe, and modI and has not had any further falls this admission. At this time no acute PT follow up is recommended, PT to sign off. Please re-consult if any needs arise. Recommendation remains long term care/memory care.  ? ?   ?Recommendations for follow up therapy are one component of a multi-disciplinary discharge planning process, led by the attending physician.  Recommendations may be updated based on patient status, additional functional criteria and insurance authorization. ? ?Follow Up Recommendations ? Long-term institutional care without follow-up therapy ?  ?  ?Assistance Recommended at Discharge Frequent or constant Supervision/Assistance  ?Patient can return home with the following Assistance with cooking/housework;Direct supervision/assist for financial management;Assist for transportation;Help with stairs or ramp for entrance;Direct supervision/assist for medications management ?  ?Equipment Recommendations ? Rolling walker (2 wheels)  ?  ?Recommendations for Other Services    ? ? ?  ?Precautions / Restrictions Precautions ?Precautions: Fall ?Restrictions ?Weight Bearing Restrictions: No  ?  ? ?Mobility ? Bed Mobility ?Overal bed mobility: Modified Independent ?  ?  ?  ?  ?  ?  ?  ?  ? ?Transfers ?Overall transfer level: Modified independent ?Equipment used: Rolling walker (2 wheels) ?  ?  ?  ?  ?  ?  ?  ?General transfer comment: pt does not recall safe hand placement for RW ?  ? ?Ambulation/Gait ?Ambulation/Gait assistance: Modified independent (Device/Increase time) ?Gait Distance (Feet): 100 Feet ?Assistive device: Rolling walker (2 wheels) ?  ?  ?  ?  ?  ? ? ?Stairs ?  ?  ?  ?  ?  ? ? ?Wheelchair Mobility ?  ? ?Modified Rankin (Stroke Patients Only) ?  ? ? ?  ?Balance Overall balance assessment: Needs assistance ?Sitting-balance support: Feet supported ?Sitting balance-Leahy Scale: Good ?  ?  ?Standing balance support: Bilateral upper extremity supported ?Standing balance-Leahy Scale: Good ?  ?  ?  ?  ?  ?  ?  ?  ?  ?  ?  ?  ?  ? ?  ?Cognition Arousal/Alertness: Awake/alert (pt emotional today) ?Behavior During Therapy: Flat affect ?Overall Cognitive Status: No family/caregiver present to determine baseline cognitive functioning ?  ?  ?  ?  ?  ?  ?  ?  ?  ?  ?  ?  ?  ?  ?  ?  ?  ?  ?  ? ?  ?Exercises Other Exercises ?Other Exercises: seated marching, LAQ, heel raises x20 ea, standing hip abduction x10 bilaterally, standing knee flexion x10 bilaterally ? ?  ?General Comments   ?  ?  ? ?  Pertinent Vitals/Pain Pain Assessment ?Pain Assessment: No/denies pain  ? ? ?Home Living   ?  ?  ?  ?  ?  ?  ?  ?  ?  ?   ?  ?Prior Function    ?  ?  ?   ? ?PT Goals (current goals can now be found in the care plan section) Progress towards PT goals: Progressing toward goals ? ?  ?Frequency ? ? ?  (DC in house) ? ? ? ?  ?PT Plan Frequency needs to be updated  ? ? ?Co-evaluation   ?  ?  ?  ?  ? ?  ?AM-PAC PT "6 Clicks" Mobility   ?Outcome Measure ? Help needed turning from your back to your side while  in a flat bed without using bedrails?: None ?Help needed moving from lying on your back to sitting on the side of a flat bed without using bedrails?: None ?Help needed moving to and from a bed to a chair (including a wheelchair)?: None ?Help needed standing up from a chair using your arms (e.g., wheelchair or bedside chair)?: None ?Help needed to walk in hospital room?: None ?Help needed climbing 3-5 steps with a railing? : A Little ?6 Click Score: 23 ? ?  ?End of Session Equipment Utilized During Treatment: Gait belt ?Activity Tolerance: Patient tolerated treatment well ?Patient left: in bed;with nursing/sitter in room ?Nurse Communication: Mobility status ?PT Visit Diagnosis: Other abnormalities of gait and mobility (R26.89) ?  ? ? ?Time: 3382-5053 ?PT Time Calculation (min) (ACUTE ONLY): 15 min ? ?Charges:  $Therapeutic Activity: 8-22 mins          ?          ? ?Lieutenant Diego PT, DPT ?11:08 AM,01/20/22 ? ? ?

## 2022-01-20 NOTE — Plan of Care (Signed)
Patient is seen in the dayroom at the beginning of the shift. He is AAOx1 to himself only. He denies pain, SI, HI, AVH, anxiety, and depression. He is medication compliant. He is safe on the unit at this time. Q 15 minute safety checks in place. 1:1 continuous observation sitter in place. ? ?Patient is alert and oriented times 2 periods of confusion noted. Patient is calm, cooperative, and pleasant. He denies pain, SI, HI, AVH, anxiety, and depression. He is compliant with medications. He is safe on the unit at this time. Q 15 minute safety checks in place. 1:1 continuous observation sitter in place. ? ?Problem: Education: ?Goal: Knowledge of [facility] Education information/materials will improve ?Outcome: Progressing ?Goal: Emotional status will improve ?Outcome: Progressing ?Goal: Mental status will improve ?Outcome: Progressing ?Goal: Verbalization of understanding the information provided will improve ?Outcome: Progressing ?  ?Problem: Activity: ?Goal: Interest or engagement in activities will improve ?Outcome: Progressing ?Goal: Sleeping patterns will improve ?Outcome: Progressing ?  ?Problem: Coping: ?Goal: Ability to verbalize frustrations and anger appropriately will improve ?Outcome: Progressing ?Goal: Ability to demonstrate self-control will improve ?Outcome: Progressing ?  ?Problem: Health Behavior/Discharge Planning: ?Goal: Identification of resources available to assist in meeting health care needs will improve ?Outcome: Progressing ?Goal: Compliance with treatment plan for underlying cause of condition will improve ?Outcome: Progressing ?  ?Problem: Physical Regulation: ?Goal: Ability to maintain clinical measurements within normal limits will improve ?Outcome: Progressing ?  ?Problem: Safety: ?Goal: Periods of time without injury will increase ?Outcome: Progressing ?  ?Problem: Education: ?Goal: Ability to make informed decisions regarding treatment will improve ?Outcome: Progressing ?   ?Problem: Health Behavior/Discharge Planning: ?Goal: Identification of resources available to assist in meeting health care needs will improve ?Outcome: Progressing ?  ?Problem: Coping: ?Goal: Ability to identify and develop effective coping behavior will improve ?Outcome: Progressing ?Goal: Ability to interact with others will improve ?Outcome: Progressing ?Goal: Demonstration of participation in decision-making regarding own care will improve ?Outcome: Progressing ?Goal: Ability to use eye contact when communicating with others will improve ?Outcome: Progressing ?  ?Problem: Health Behavior/Discharge Planning: ?Goal: Identification of resources available to assist in meeting health care needs will improve ?Outcome: Progressing ?  ?Problem: Self-Concept: ?Goal: Will verbalize positive feelings about self ?Outcome: Progressing ?  ?

## 2022-01-20 NOTE — Progress Notes (Signed)
1:1 Note ? ?Patient compliant with medications. Denies SI/HI/A/VH and verbally contracted for safety. 1:1 Observation ongoing Patient remains safe ambulating with front wheel walker.  ? ?Support and encouragement ongoing.  ?

## 2022-01-20 NOTE — Progress Notes (Signed)
[facility] MD Progress Note ? ?01/20/2022 12:41 PM ?Jay Webster  ?MRN:  [id number] ?Subjective: Jay Webster is seen today.  He states that he is not feeling well but he does not know exactly what the problem is.  He did start modafinil this morning and I told him it could possibly be a side effect that he is just not used to but I told him we would give it a couple days to see how he does with that it does help with his energy and depression. ? ?Principal Problem: Major depressive disorder, recurrent episode, severe ([place]) ?Diagnosis: Principal Problem: ?  Major depressive disorder, recurrent episode, severe ([place]) ?Active Problems: ?  Cognitive and behavioral changes ? ?Total Time spent with patient: 15 minutes ? ?Past Psychiatric History: None ? ?Past Medical History:  ?Past Medical History:  ?Diagnosis Date  ? Arthritis   ? OA AND PAIN LEFT HIP AND OTHER JOINT PAINS  ? Cancer [facility])   ? THYROID CANCER - HAD THYROID REMOVED  ? History of kidney stones   ? History of shingles   ? RESOLVED  ? Hypertension   ? Hypothyroidism   ? Low back pain   ? Macular degeneration of both eyes   ? RBBB (right bundle branch block with left anterior fascicular block)   ? Seasonal allergies   ? Thyroid disease   ? Uric acid renal calculus 06/20/2007  ? Qualifier: Diagnosis of  By: Rogue Bussing CMA, Jacqualynn    ?  ?Past Surgical History:  ?Procedure Laterality Date  ? APPENDECTOMY  1948  ? CATARACT EXTRACTION Right 08/27/2019  ? COLONOSCOPY    ? Elbow Radical Reduction  1973  ? IR IMAGING GUIDED PORT INSERTION  05/05/2018  ? IR REMOVAL TUN ACCESS W/ PORT W/O FL MOD SED  10/18/2018  ? [place]  ? MASS EXCISION N/A 03/11/2018  ? Procedure: EXCISION MASS OF PERINEUM;  Surgeon: Armandina Gemma, MD;  Location: [facility];  Service: General;  Laterality: N/A;  ? REMOVAL OF FIRST RIB   [place]  ? FOR COMPRESSED VEIN BETWEEN 1 ST RIB AND COLLARBONE  ? Rib removed, 1st  1994  ? ROTATOR CUFF REPAIR  2009  ? THYROIDECTOMY  2010  ? TOTAL HIP  ARTHROPLASTY Left 05/31/2013  ? Procedure: LEFT TOTAL HIP ARTHROPLASTY ANTERIOR APPROACH;  Surgeon: Gearlean Alf, MD;  Location: [facility];  Service: Orthopedics;  Laterality: Left;  ? ?Family History:  ?Family History  ?Problem Relation Age of Onset  ? Heart disease Mother   ?     passed in 67s  ? Diabetes Mother   ? Alcohol abuse Father   ?     passed from this  ? Parkinson's disease Sister   ? Diabetes Brother   ? Dementia Sister   ? Cancer Brother   ?     sounds like bone cancer- non healing knee injury  ? ? ?Social History:  ?Social History  ? ?Substance and Sexual Activity  ?Alcohol Use Not Currently  ? Alcohol/week: 0.0 - 1.0 standard drinks  ?   ?Social History  ? ?Substance and Sexual Activity  ?Drug Use No  ?  ?Social History  ? ?Socioeconomic History  ? Marital status: Widowed  ?  Spouse name: Not on file  ? Number of children: Not on file  ? Years of education: Not on file  ? Highest education level: Not on file  ?Occupational History  ? Occupation: retired  ?Tobacco Use  ?  Smoking status: Former  ?  Types: Cigarettes  ?  Quit date: 02/22/1979  ?  Years since quitting: 42.9  ? Smokeless tobacco: Never  ?Vaping Use  ? Vaping Use: Never used  ?Substance and Sexual Activity  ? Alcohol use: Not Currently  ?  Alcohol/week: 0.0 - 1.0 standard drinks  ? Drug use: No  ? Sexual activity: Not Currently  ?Other Topics Concern  ? Not on file  ?Social History Narrative  ? Lives alone and 1 cat. Divorced from mongomous long term partner and eventual husband. No children from prior relationships.   ?   ? Retired Investment banker, corporate  ?   ? Hobbies: socializing, used to play tennis  ? ?Social Determinants of Health  ? ?Financial Resource Strain: Not on file  ?Food Insecurity: Not on file  ?Transportation Needs: Not on file  ?Physical Activity: Not on file  ?Stress: Not on file  ?Social Connections: Not on file  ? ?Additional Social History:  ?  ?  ?  ?  ?  ?  ?  ?  ?  ?  ?  ? ?Sleep: Good ? ?Appetite:  Good ? ?Current  Medications: ?Current Facility-Administered Medications  ?Medication Dose Route Frequency Provider Last Rate Last Admin  ? acetaminophen (TYLENOL) tablet 650 mg  650 mg Oral Q6H PRN Deloria Lair, NP   650 mg at 01/18/22 2032  ? alum & mag hydroxide-simeth (MAALOX/MYLANTA) 200-200-20 MG/5ML suspension 30 mL  30 mL Oral Q4H PRN Parks Ranger, DO      ? buPROPion (WELLBUTRIN XL) 24 hr tablet 150 mg  150 mg Oral Daily Parks Ranger, DO   150 mg at 01/20/22 0830  ? donepezil (ARICEPT) tablet 5 mg  5 mg Oral QHS Parks Ranger, DO   5 mg at 01/19/22 2155  ? haloperidol (HALDOL) tablet 0.5 mg  0.5 mg Oral [id number] Parks Ranger, DO   0.5 mg at 01/20/22 0830  ? levothyroxine (SYNTHROID) tablet 125 mcg  125 mcg Oral Daily Parks Ranger, [place]   125 mcg at 01/19/22 1324  ? LORazepam (ATIVAN) tablet 0.5 mg  0.5 mg Oral Q4H PRN Parks Ranger, DO      ? magnesium hydroxide (MILK OF MAGNESIA) suspension 30 mL  30 mL Oral Daily PRN Parks Ranger, DO      ? mirtazapine (REMERON) tablet 45 mg  45 mg Oral QHS Clapacs, Madie Reno, MD   45 mg at 01/19/22 2157  ? modafinil (PROVIGIL) tablet 200 mg  200 mg Oral Daily Parks Ranger, DO   200 mg at 01/20/22 0830  ? OLANZapine (ZYPREXA) tablet 5 mg  5 mg Oral Q6H PRN Parks Ranger, DO      ? traZODone (DESYREL) tablet 100 mg  100 mg Oral QHS PRN Parks Ranger, DO   100 mg at 01/15/22 2114  ? ? ?Lab Results: No results found for this or any previous visit (from the past 48 hour(s)). ? ?Blood Alcohol level:  ?Lab Results  ?Component Value Date  ? ETH <10 01/05/2022  ? ? ?Metabolic Disorder Labs: ?Lab Results  ?Component Value Date  ? HGBA1C 5.6 01/10/2022  ? MPG 114.02 01/10/2022  ? MPG 117 08/20/2020  ? ?No results found for: PROLACTIN ?Lab Results  ?Component Value Date  ? CHOL 146 01/10/2022  ? TRIG 52 01/10/2022  ? HDL 62 01/10/2022  ? CHOLHDL 2.4 01/10/2022  ? VLDL 10 01/10/2022  ? [place] 74  01/10/2022  ? [place] 69 02/19/2020  ? ? ?Physical Findings: ?AIMS: Facial and Oral Movements ?Muscles of Facial Expression: None, normal ?Lips and Perioral Area: None, normal ?Jaw: None, normal ?Tongue: None, normal,Extremity Movements ?Upper (arms, wrists, hands, fingers): None, normal ?Lower (legs, knees, ankles, toes): None, normal, Trunk Movements ?Neck, shoulders, hips: None, normal, Overall Severity ?Severity of abnormal movements (highest score from questions above): None, normal ?Incapacitation due to abnormal movements: None, normal ?Patient's awareness of abnormal movements (rate only patient's report): No Awareness, Dental Status ?Current problems with teeth and/or dentures?: No ?Does patient usually wear dentures?: No  ?CIWA:    ?COWS:    ? ?Musculoskeletal: ?Strength & Muscle Tone: within normal limits ?Gait & Station: normal ?Patient leans: N/A ? ?Psychiatric Specialty Exam: ? ?Presentation  ?General Appearance: Appropriate for Environment; Casual; Neat ? ?Eye Contact:Fair ? ?Speech:Slow ? ?Speech Volume:Decreased ? ?Handedness:Right ? ? ?Mood and Affect  ?Mood:Anxious ? ?Affect:Constricted ? ? ?Thought Process  ?Thought Processes:Irrevelant ? ?Descriptions of Associations:Loose ? ?Orientation:Partial ? ?Thought Content:Illogical ? ?History of Schizophrenia/Schizoaffective disorder:No data recorded ?Duration of Psychotic Symptoms:No data recorded ?Hallucinations:No data recorded ?Ideas of Reference:None ? ?Suicidal Thoughts:No data recorded ?Homicidal Thoughts:No data recorded ? ?Sensorium  ?Memory:Immediate Poor; Remote Poor ? ?Judgment:Fair ? ?Insight:Fair ? ? ?Executive Functions  ?Concentration:Fair ? ?Attention Span:Fair ? ?Recall:Fair ? ?[place] ? ?Language:Fair ? ? ?Psychomotor Activity  ?Psychomotor Activity:No data recorded ? ?Assets  ?Assets:Resilience ? ? ?Sleep  ?Sleep:No data recorded ? ? ?Physical Exam: ?Physical Exam ?Vitals and nursing note reviewed.  ?Constitutional:    ?   Appearance: Normal appearance. He is normal weight.  ?Neurological:  ?   General: No focal deficit present.  ?   Mental Status: He is alert and oriented to person, place, and time.  ?Psychiatric:     ?

## 2022-01-21 DIAGNOSIS — R4189 Other symptoms and signs involving cognitive functions and awareness: Secondary | ICD-10-CM | POA: Diagnosis not present

## 2022-01-21 DIAGNOSIS — R4689 Other symptoms and signs involving appearance and behavior: Secondary | ICD-10-CM | POA: Diagnosis not present

## 2022-01-21 NOTE — Progress Notes (Signed)
Recreation Therapy Notes  ?  ?Date: 01/21/2022 ?  ?Time: 1:35pm   ?  ?Location: Craft room ?  ?Behavioral response:  None ? ?Intervention Topic: Self-care ?  ?Discussion/Intervention:  ?Group content today was focused on Self-Care. The group defined self-care and some positive ways they care for themselves. Individuals expressed ways and reasons why they neglected any self-care in the past. Patients described ways to improve self-care in the future. The group explained what could happen if they did not do any self-care activities at all. The group participated in the intervention ?self-care assessment? where they had a chance to discover some of their weaknesses and strengths in self- care. Patient came up with a self-care plan to improve themselves in the future.  ?Clinical Observations/Feedback: ?Patient came to group and was sleep.  ? ?Jay Webster LRT/CTRS  ?  ?  ? ? ? ? ? ? ? ?Jay Webster ?01/21/2022 2:57 PM ?

## 2022-01-21 NOTE — Progress Notes (Signed)
[facility] MD Progress Note ? ?01/21/2022 12:24 PM ?Jay Webster  ?MRN:  [id number] ?Subjective: Jay Webster is in a better mood today.  He seems to be tolerating his medications pretty well there is no evidence of side effects.  He is eating and sleeping.  Social work continues to look for assisted living.  He is still depressed but denies any suicidal thoughts. ? ?Principal Problem: Major depressive disorder, recurrent episode, severe ([place]) ?Diagnosis: Principal Problem: ?  Major depressive disorder, recurrent episode, severe ([place]) ?Active Problems: ?  Cognitive and behavioral changes ? ?Total Time spent with patient: 15 minutes ? ?Past Psychiatric History: None ? ?Past Medical History:  ?Past Medical History:  ?Diagnosis Date  ? Arthritis   ? OA AND PAIN LEFT HIP AND OTHER JOINT PAINS  ? Cancer [facility])   ? THYROID CANCER - HAD THYROID REMOVED  ? History of kidney stones   ? History of shingles   ? RESOLVED  ? Hypertension   ? Hypothyroidism   ? Low back pain   ? Macular degeneration of both eyes   ? RBBB (right bundle branch block with left anterior fascicular block)   ? Seasonal allergies   ? Thyroid disease   ? Uric acid renal calculus 06/20/2007  ? Qualifier: Diagnosis of  By: Rogue Bussing CMA, Jacqualynn    ?  ?Past Surgical History:  ?Procedure Laterality Date  ? APPENDECTOMY  1948  ? CATARACT EXTRACTION Right 08/27/2019  ? COLONOSCOPY    ? Elbow Radical Reduction  1973  ? IR IMAGING GUIDED PORT INSERTION  05/05/2018  ? IR REMOVAL TUN ACCESS W/ PORT W/O FL MOD SED  10/18/2018  ? [place]  ? MASS EXCISION N/A 03/11/2018  ? Procedure: EXCISION MASS OF PERINEUM;  Surgeon: Armandina Gemma, MD;  [facility];  Service: General;  Laterality: N/A;  ? REMOVAL OF FIRST RIB   [place]  ? FOR COMPRESSED VEIN BETWEEN 1 ST RIB AND COLLARBONE  ? Rib removed, 1st  1994  ? ROTATOR CUFF REPAIR  2009  ? THYROIDECTOMY  2010  ? TOTAL HIP ARTHROPLASTY Left 05/31/2013  ? Procedure: LEFT TOTAL HIP ARTHROPLASTY ANTERIOR  APPROACH;  Surgeon: Gearlean Alf, MD;  [facility];  Service: Orthopedics;  Laterality: Left;  ? ?Family History:  ?Family History  ?Problem Relation Age of Onset  ? Heart disease Mother   ?     passed in 65s  ? Diabetes Mother   ? Alcohol abuse Father   ?     passed from this  ? Parkinson's disease Sister   ? Diabetes Brother   ? Dementia Sister   ? Cancer Brother   ?     sounds like bone cancer- non healing knee injury  ? ? ?Social History:  ?Social History  ? ?Substance and Sexual Activity  ?Alcohol Use Not Currently  ? Alcohol/week: 0.0 - 1.0 standard drinks  ?   ?Social History  ? ?Substance and Sexual Activity  ?Drug Use No  ?  ?Social History  ? ?Socioeconomic History  ? Marital status: Widowed  ?  Spouse name: Not on file  ? Number of children: Not on file  ? Years of education: Not on file  ? Highest education level: Not on file  ?Occupational History  ? Occupation: retired  ?Tobacco Use  ? Smoking status: Former  ?  Types: Cigarettes  ?  Quit date: 02/22/1979  ?  Years since quitting: 42.9  ?  Smokeless tobacco: Never  ?Vaping Use  ? Vaping Use: Never used  ?Substance and Sexual Activity  ? Alcohol use: Not Currently  ?  Alcohol/week: 0.0 - 1.0 standard drinks  ? Drug use: No  ? Sexual activity: Not Currently  ?Other Topics Concern  ? Not on file  ?Social History Narrative  ? Lives alone and 1 cat. Divorced from mongomous long term partner and eventual husband. No children from prior relationships.   ?   ? Retired Investment banker, corporate  ?   ? Hobbies: socializing, used to play tennis  ? ?Social Determinants of Health  ? ?Financial Resource Strain: Not on file  ?Food Insecurity: Not on file  ?Transportation Needs: Not on file  ?Physical Activity: Not on file  ?Stress: Not on file  ?Social Connections: Not on file  ? ?Additional Social History:  ?  ?  ?  ?  ?  ?  ?  ?  ?  ?  ?  ? ?Sleep: Good ? ?Appetite:  Good ? ?Current Medications: ?Current Facility-Administered Medications  ?Medication Dose Route  Frequency Provider Last Rate Last Admin  ? acetaminophen (TYLENOL) tablet 650 mg  650 mg Oral Q6H PRN Deloria Lair, NP   650 mg at 01/20/22 2242  ? alum & mag hydroxide-simeth (MAALOX/MYLANTA) 200-200-20 MG/5ML suspension 30 mL  30 mL Oral Q4H PRN Parks Ranger, DO      ? buPROPion (WELLBUTRIN XL) 24 hr tablet 150 mg  150 mg Oral Daily Parks Ranger, DO   150 mg at 01/21/22 2130  ? donepezil (ARICEPT) tablet 5 mg  5 mg Oral QHS Parks Ranger, DO   5 mg at 01/20/22 2117  ? haloperidol (HALDOL) tablet 0.5 mg  0.5 mg Oral [id number] Parks Ranger, DO   0.5 mg at 01/21/22 9629  ? levothyroxine (SYNTHROID) tablet 125 mcg  125 mcg Oral Daily Parks Ranger, [place]   125 mcg at 01/19/22 5284  ? LORazepam (ATIVAN) tablet 0.5 mg  0.5 mg Oral Q4H PRN Parks Ranger, DO      ? magnesium hydroxide (MILK OF MAGNESIA) suspension 30 mL  30 mL Oral Daily PRN Parks Ranger, DO      ? mirtazapine (REMERON) tablet 45 mg  45 mg Oral QHS Clapacs, Madie Reno, MD   45 mg at 01/20/22 2117  ? modafinil (PROVIGIL) tablet 200 mg  200 mg Oral Daily Parks Ranger, DO   200 mg at 01/21/22 1324  ? OLANZapine (ZYPREXA) tablet 5 mg  5 mg Oral Q6H PRN Parks Ranger, DO      ? traZODone (DESYREL) tablet 100 mg  100 mg Oral QHS PRN Parks Ranger, DO   100 mg at 01/20/22 2118  ? ? ?Lab Results: No results found for this or any previous visit (from the past 48 hour(s)). ? ?Blood Alcohol level:  ?Lab Results  ?Component Value Date  ? ETH <10 01/05/2022  ? ? ?Metabolic Disorder Labs: ?Lab Results  ?Component Value Date  ? HGBA1C 5.6 01/10/2022  ? MPG 114.02 01/10/2022  ? MPG 117 08/20/2020  ? ?No results found for: PROLACTIN ?Lab Results  ?Component Value Date  ? CHOL 146 01/10/2022  ? TRIG 52 01/10/2022  ? HDL 62 01/10/2022  ? CHOLHDL 2.4 01/10/2022  ? VLDL 10 01/10/2022  ? [place] 74 01/10/2022  ? [place] 69 02/19/2020  ? ? ?Physical Findings: ?AIMS: Facial and  Oral Movements ?Muscles of Facial Expression: None, normal ?  Lips and Perioral Area: None, normal ?Jaw: None, normal ?Tongue: None, normal,Extremity Movements ?Upper (arms, wrists, hands, fingers): None, normal ?Lower (legs, knees, ankles, toes): None, normal, Trunk Movements ?Neck, shoulders, hips: None, normal, Overall Severity ?Severity of abnormal movements (highest score from questions above): None, normal ?Incapacitation due to abnormal movements: None, normal ?Patient's awareness of abnormal movements (rate only patient's report): No Awareness, Dental Status ?Current problems with teeth and/or dentures?: No ?Does patient usually wear dentures?: No  ?CIWA:    ?COWS:    ? ?Musculoskeletal: ?Strength & Muscle Tone: within normal limits ?Gait & Station: unsteady ?Patient leans: N/A ? ?Psychiatric Specialty Exam: ? ?Presentation  ?General Appearance: Appropriate for Environment; Casual; Neat ? ?Eye Contact:Fair ? ?Speech:Slow ? ?Speech Volume:Decreased ? ?Handedness:Right ? ? ?Mood and Affect  ?Mood:Anxious ? ?Affect:Constricted ? ? ?Thought Process  ?Thought Processes:Irrevelant ? ?Descriptions of Associations:Loose ? ?Orientation:Partial ? ?Thought Content:Illogical ? ?History of Schizophrenia/Schizoaffective disorder:No data recorded ?Duration of Psychotic Symptoms:No data recorded ?Hallucinations:No data recorded ?Ideas of Reference:None ? ?Suicidal Thoughts:No data recorded ?Homicidal Thoughts:No data recorded ? ?Sensorium  ?Memory:Immediate Poor; Remote Poor ? ?Judgment:Fair ? ?Insight:Fair ? ? ?Executive Functions  ?Concentration:Fair ? ?Attention Span:Fair ? ?Recall:Fair ? ?[place] ? ?Language:Fair ? ? ?Psychomotor Activity  ?Psychomotor Activity:No data recorded ? ?Assets  ?Assets:Resilience ? ? ?Sleep  ?Sleep:No data recorded ? ? ?Physical Exam: ?Physical Exam ?Vitals and nursing note reviewed.  ?Constitutional:   ?   Appearance: Normal appearance. He is normal weight.  ?Neurological:  ?    General: No focal deficit present.  ?   Mental Status: He is alert and oriented to person, place, and time.  ?Psychiatric:     ?   Attention and Perception: Attention and perception normal.     ?   Mood and Aff

## 2022-01-21 NOTE — Progress Notes (Signed)
1:1 NOTE ? ?Patient in bed sleeping respirations noted. No falls 1:1 Observation ongoing. Patient remains safe.  ? ?Support provided.  ?

## 2022-01-21 NOTE — B[facility] Counselor (Addendum)
CSW contacted pt's niece Evon Slack, [phone].  ?She stated that following places have potential follow up care options for placement. CSW will contact the following facilities regarding pt potential placement.  ? ?[facility], [place], Tanda Rockers, [phone] ? ?[facility]- assisted living facility, Conan [place]ens [place], [phone]. Contact regarding placement, $8,000/month and respite care ? ?[facility]- assisted living facility, Terrial Rhodes,  [phone] ? ?[facility], 24 hr supervision, Antony Madura, [phone] ? ?[facility], [phone], contact regarding assistance ? ?CSW will talk with pt regarding potential referrals and preference for discharging.  ? ?Ambrea Hegler [place], MSW, LCSW-A ?5/17/202312:02 PM  ?

## 2022-01-21 NOTE — Progress Notes (Signed)
Patient ambulated to the bathroom with front wheel walker and sitter all fall protocol in place. Continues to have unsteady gait. Support and encouragement provided.  ?

## 2022-01-21 NOTE — BHH Counselor (Signed)
CSW contacted pt's niece Evon Slack, [phone].  ?She stated that following places have potential follow up care options for placement. CSW will contact the following facilities regarding pt potential placement.  ? ?CSW spoke with Radonna Ricker, [place] regarding potential acceptance for pt.  ?[facility], [phone]. 2 week deposit of care and non medical supervision care at $4,711 approximately. Could assess pt at hospital for level of care determination.  ? ? [facility]- assisted living facility, Con[place], [phone]. Contact regarding placement, $7,200/month and respite care. Pt is unable to be accepted to facility unless he has POA.  Suggested CSW contact [facility] for independent living, [place], [place] [phone] ? ? ? ?CSW will talk with pt regarding potential referrals and preference for discharging.  ? ?Perlie Scheuring [place], MSW, LCSW-A ?5/17/20232:21 PM  ?

## 2022-01-21 NOTE — Plan of Care (Signed)
Patient is alert and oriented times 2 periods of confusion noted. Patient is calm, cooperative, and pleasant. He denies pain, SI, HI, AVH, anxiety, and depression. He is compliant with medications. He is safe on the unit at this time. Q 15 minute safety checks in place. 1:1 continuous observation sitter in place. ? ? ?Problem: Education: ?Goal: Knowledge of [place] [facility] Education information/materials will improve ?Outcome: Progressing ?Goal: Emotional status will improve ?Outcome: Progressing ?Goal: Mental status will improve ?Outcome: Progressing ?Goal: Verbalization of understanding the information provided will improve ?Outcome: Progressing ?  ?Problem: Activity: ?Goal: Interest or engagement in activities will improve ?Outcome: Progressing ?  ?Problem: Coping: ?Goal: Ability to verbalize frustrations and anger appropriately will improve ?Outcome: Progressing ?Goal: Ability to demonstrate self-control will improve ?Outcome: Progressing ?  ?Problem: Health Behavior/Discharge Planning: ?Goal: Identification of resources available to assist in meeting health care needs will improve ?Outcome: Progressing ?Goal: Compliance with treatment plan for underlying cause of condition will improve ?Outcome: Progressing ?  ?Problem: Physical Regulation: ?Goal: Ability to maintain clinical measurements within normal limits will improve ?Outcome: Progressing ?  ?Problem: Safety: ?Goal: Periods of time without injury will increase ?Outcome: Progressing ?  ?Problem: Education: ?Goal: Ability to make informed decisions regarding treatment will improve ?Outcome: Progressing ?  ?Problem: Health Behavior/Discharge Planning: ?Goal: Identification of resources available to assist in meeting health care needs will improve ?Outcome: Progressing ?  ?Problem: Medication: ?Goal: Compliance with prescribed medication regimen will improve ?Outcome: Progressing ?  ?Problem: Self-Concept: ?Goal: Will verbalize positive feelings about  self ?Outcome: Progressing ?  ?Problem: Activity: ?Goal: Will identify at least one activity in which they can participate ?Outcome: Progressing ?  ?Problem: Self-Concept: ?Goal: Will verbalize positive feelings about self ?Outcome: Progressing ?  ?  ?

## 2022-01-22 DIAGNOSIS — R4689 Other symptoms and signs involving appearance and behavior: Secondary | ICD-10-CM | POA: Diagnosis not present

## 2022-01-22 DIAGNOSIS — R4189 Other symptoms and signs involving cognitive functions and awareness: Secondary | ICD-10-CM | POA: Diagnosis not present

## 2022-01-22 NOTE — Progress Notes (Signed)
[facility] MD Progress Note  01/22/2022 1:14 PM Jay Webster  MRN:  [id number] Subjective: Jay Webster is seen on rounds.  His blood pressure and pulse are up a little bit when his temperature is normal.  He is more lethargic today somewhat to make some med changes and get some lab work tomorrow morning.  He has been taking his medications up until today but this is due to him being more lethargic.  Principal Problem: Major depressive disorder, recurrent episode, severe ([place]) Diagnosis: Principal Problem:   Major depressive disorder, recurrent episode, severe (HCC) Active Problems:   Cognitive and behavioral changes  Total Time spent with patient: 15 minutes  Past Psychiatric History: None  Past Medical History:  Past Medical History:  Diagnosis Date   Arthritis    OA AND PAIN LEFT HIP AND OTHER JOINT PAINS   Cancer ([place])    THYROID CANCER - HAD THYROID REMOVED   History of kidney stones    History of shingles    RESOLVED   Hypertension    Hypothyroidism    Low back pain    Macular degeneration of both eyes    RBBB (right bundle branch block with left anterior fascicular block)    Seasonal allergies    Thyroid disease    Uric acid renal calculus 06/20/2007   Qualifier: Diagnosis of  By: Rogue Bussing CMA, Maryann Alar      Past Surgical History:  Procedure Laterality Date   APPENDECTOMY  1948   CATARACT EXTRACTION Right 08/27/2019   COLONOSCOPY     Elbow Radical Reduction  1973   IR IMAGING GUIDED PORT INSERTION  05/05/2018   IR REMOVAL TUN ACCESS W/ PORT W/O FL MOD SED  10/18/2018   [place], 1976   MASS EXCISION N/A 03/11/2018   Procedure: EXCISION MASS OF PERINEUM;  Surgeon: Armandina Gemma, MD;  Location: [facility];  Service: General;  Laterality: N/A;   REMOVAL OF FIRST RIB   NOV 1994   FOR COMPRESSED VEIN BETWEEN 1 ST RIB AND COLLARBONE   Rib removed, 1st  1994   ROTATOR CUFF REPAIR  2009   THYROIDECTOMY  2010   TOTAL HIP ARTHROPLASTY Left 05/31/2013   Procedure:  LEFT TOTAL HIP ARTHROPLASTY ANTERIOR APPROACH;  Surgeon: Gearlean Alf, MD;  Location: [facility];  Service: Orthopedics;  Laterality: Left;   Family History:  Family History  Problem Relation Age of Onset   Heart disease Mother        passed in 68s   Diabetes Mother    Alcohol abuse Father        passed from this   Parkinson's disease Sister    Diabetes Brother    Dementia Sister    Cancer Brother        sounds like bone cancer- non healing knee injury   Family Psychiatric  History: Unremarkable Social History:  Social History   Substance and Sexual Activity  Alcohol Use Not Currently   Alcohol/week: 0.0 - 1.0 standard drinks     Social History   Substance and Sexual Activity  Drug Use No    Social History   Socioeconomic History   Marital status: Widowed    Spouse name: Not on file   Number of children: Not on file   Years of education: Not on file   Highest education level: Not on file  Occupational History   Occupation: retired  Tobacco Use   Smoking status: Former    Types: Cigarettes  Quit date: 02/22/1979    Years since quitting: 42.9   Smokeless tobacco: Never  Vaping Use   Vaping Use: Never used  Substance and Sexual Activity   Alcohol use: Not Currently    Alcohol/week: 0.0 - 1.0 standard drinks   Drug use: No   Sexual activity: Not Currently  Other Topics Concern   Not on file  Social History Narrative   Lives alone and 1 cat. Divorced from mongomous long term partner and eventual husband. No children from prior relationships.       Retired Geologist, engineering: socializing, used to play tennis   Social Determinants of Radio broadcast assistant Strain: Not on [employer] Insecurity: Not on file  Transportation Needs: Not on file  Physical Activity: Not on file  Stress: Not on file  Social Connections: Not on file   Additional Social History:                         Sleep: Good  Appetite:  Fair  Current  Medications: Current Facility-Administered Medications  Medication Dose Route Frequency Provider Last Rate Last Admin   acetaminophen (TYLENOL) tablet 650 mg  650 mg Oral Q6H PRN Deloria Lair, NP   650 mg at 01/20/22 2242   alum & mag hydroxide-simeth (MAALOX/MYLANTA) 200-200-20 MG/5ML suspension 30 mL  30 mL Oral Q4H PRN Parks Ranger, DO       buPROPion (WELLBUTRIN XL) 24 hr tablet 150 mg  150 mg Oral Daily Parks Ranger, DO   150 mg at 01/21/22 1610   donepezil (ARICEPT) tablet 5 mg  5 mg Oral QHS Parks Ranger, DO   5 mg at 01/21/22 2116   levothyroxine (SYNTHROID) tablet 125 mcg  125 mcg Oral Daily Parks Ranger, DO   125 mcg at 01/19/22 0754   LORazepam (ATIVAN) tablet 0.5 mg  0.5 mg Oral Q4H PRN Parks Ranger, DO       magnesium hydroxide (MILK OF MAGNESIA) suspension 30 mL  30 mL Oral Daily PRN Parks Ranger, DO       modafinil (PROVIGIL) tablet 200 mg  200 mg Oral Daily Parks Ranger, DO   200 mg at 01/21/22 0837   OLANZapine (ZYPREXA) tablet 5 mg  5 mg Oral Q6H PRN Parks Ranger, DO       traZODone (DESYREL) tablet 100 mg  100 mg Oral QHS PRN Parks Ranger, DO   100 mg at 01/20/22 2118    Lab Results: No results found for this or any previous visit (from the past 48 hour(s)).  Blood Alcohol level:  Lab Results  Component Value Date   ETH <10 96/12/5407    Metabolic Disorder Labs: Lab Results  Component Value Date   HGBA1C 5.6 01/10/2022   MPG 114.02 01/10/2022   MPG 117 08/20/2020   No results found for: PROLACTIN Lab Results  Component Value Date   CHOL 146 01/10/2022   TRIG 52 01/10/2022   HDL 62 01/10/2022   CHOLHDL 2.4 01/10/2022   VLDL 10 01/10/2022   LDLCALC 74 01/10/2022   LDLCALC 69 02/19/2020    Physical Findings: AIMS: Facial and Oral Movements Muscles of Facial Expression: None, normal Lips and Perioral Area: None, normal Jaw: None, normal Tongue: None,  normal,Extremity Movements Upper (arms, wrists, hands, fingers): None, normal Lower (legs, knees, ankles, toes): None, normal, Trunk Movements Neck, shoulders, hips: None, normal,  Overall Severity Severity of abnormal movements (highest score from questions above): None, normal Incapacitation due to abnormal movements: None, normal Patient's awareness of abnormal movements (rate only patient's report): No Awareness, Dental Status Current problems with teeth and/or dentures?: No Does patient usually wear dentures?: No  CIWA:    COWS:     Musculoskeletal: Strength & Muscle Tone: decreased Gait & Station: unsteady Patient leans: N/A  Psychiatric Specialty Exam:  Presentation  General Appearance: Appropriate for Environment; Casual; Neat  Eye Contact:Fair  Speech:Slow  Speech Volume:Decreased  Handedness:Right   Mood and Affect  Mood:Anxious  Affect:Constricted   Thought Process  Thought Processes:Irrevelant  Descriptions of Associations:Loose  Orientation:Partial  Thought Content:Illogical  History of Schizophrenia/Schizoaffective disorder:No data recorded Duration of Psychotic Symptoms:No data recorded Hallucinations:No data recorded Ideas of Reference:None  Suicidal Thoughts:No data recorded Homicidal Thoughts:No data recorded  Sensorium  Memory:Immediate Poor; Remote Poor  Judgment:Fair  Insight:Fair   Executive Functions  Concentration:Fair  Attention Span:Fair  [place]   Psychomotor Activity  Psychomotor Activity:No data recorded  Assets  Assets:Resilience   Sleep  Sleep:No data recorded   Physical Exam: Physical Exam Vitals and nursing note reviewed.  Constitutional:      Appearance: Normal appearance. He is normal weight.  Neurological:     General: No focal deficit present.     Mental Status: He is alert. He is disoriented.  Psychiatric:        Mood and Affect: Mood normal.         Behavior: Behavior normal.   Review of Systems  Constitutional: Negative.   HENT: Negative.    Eyes: Negative.   Respiratory: Negative.    Cardiovascular: Negative.   Gastrointestinal: Negative.   Genitourinary: Negative.   Musculoskeletal: Negative.   Skin: Negative.   Neurological: Negative.   Endo/Heme/Allergies: Negative.   Psychiatric/Behavioral:  Positive for depression.   Blood pressure (!) 150/62, pulse 91, temperature 98 F (36.7 C), temperature source Oral, resp. rate 19, height '5\' 4"'$  (1.626 m), weight 84.3 kg, SpO2 96 %. Body mass index is 31.91 kg/m.   Treatment Plan Summary: Daily contact with patient to assess and evaluate symptoms and progress in treatment, Medication management, and Plan discontinue Remeron and Haldol.  CBC and CMP in the a.m.  [place], DO 01/22/2022, 1:14 PM

## 2022-01-22 NOTE — Progress Notes (Signed)
Recreation Therapy Notes  Date: 01/22/2022   Time: 2:30pm     Location: Craft room    Behavioral response: N/A   Intervention Topic: Values    Discussion/Intervention: Patient refused to attend group.    Clinical Observations/Feedback:  Patient refused to attend group.    Blakeley Scheier LRT/CTRS        Meleane Selinger 01/22/2022 2:38 PM

## 2022-01-22 NOTE — Progress Notes (Signed)
1:1 sitter with patient in day room as he sleeps in wheelchair. Will continue to monitor for changes.

## 2022-01-22 NOTE — Progress Notes (Signed)
1:1 NOTE  Patient observed on 1:1 this shift without incident. Patient in bed sleeping respirations noted. Patient had an incontinent of bladder incident, Patient was cleaned and no fall this shift.  Q 15 minutes safety checks ongoing.

## 2022-01-22 NOTE — Progress Notes (Signed)
Patient is alert and oriented times 2 periods of confusion noted. Patient is calm, cooperative, and pleasant. He denies pain, SI, HI, AVH, anxiety, and depression. He is compliant with medications. He is safe on the unit at this time. Q 15 minute safety checks in place. 1:1 continuous observation sitter in place.

## 2022-01-22 NOTE — Plan of Care (Signed)
  Problem: Education: Goal: Knowledge of [place] Education information/materials will improve Outcome: Progressing Goal: Emotional status will improve Outcome: Progressing Goal: Mental status will improve Outcome: Progressing Goal: Verbalization of understanding the information provided will improve Outcome: Progressing   Problem: Activity: Goal: Interest or engagement in activities will improve Outcome: Progressing Goal: Sleeping patterns will improve Outcome: Progressing   Problem: Coping: Goal: Ability to verbalize frustrations and anger appropriately will improve Outcome: Progressing Goal: Ability to demonstrate self-control will improve Outcome: Progressing   Problem: Health Behavior/Discharge Planning: Goal: Identification of resources available to assist in meeting health care needs will improve Outcome: Progressing Goal: Compliance with treatment plan for underlying cause of condition will improve Outcome: Progressing   Problem: Physical Regulation: Goal: Ability to maintain clinical measurements within normal limits will improve Outcome: Progressing   Problem: Education: Goal: Ability to make informed decisions regarding treatment will improve Outcome: Progressing   Problem: Health Behavior/Discharge Planning: Goal: Identification of resources available to assist in meeting health care needs will improve Outcome: Progressing   Problem: Medication: Goal: Compliance with prescribed medication regimen will improve Outcome: Progressing   Problem: Self-Concept: Goal: Will verbalize positive feelings about self Outcome: Progressing

## 2022-01-22 NOTE — Progress Notes (Signed)
1:1 Note  Patient alert and oriented by x reoriented to Place and Situation. Denies SI/HI/A/VH but appears more confused this shift stated that " I want to go to the bank and withdraw the money that I worked for today" Patient continue to ambulate with front wheel walker and 1:1 observation ongoing for safety and falls. All fall protocol are in place. Patient remains safe.

## 2022-01-22 NOTE — Progress Notes (Signed)
1:1 Note  Patient continues to be observed on 1:1 for safety and falls. Patient ambulating with front wheel walker. Requires reminders to  use his walker. Patient has unsteady gait. All fall protocol in place. Support and  encouragement ongoing. Patient remains safe.

## 2022-01-22 NOTE — Progress Notes (Signed)
1:1 sitter with patient in day room as he eats lunch. Will continue to monitor for changes.

## 2022-01-23 DIAGNOSIS — R4189 Other symptoms and signs involving cognitive functions and awareness: Secondary | ICD-10-CM | POA: Diagnosis not present

## 2022-01-23 DIAGNOSIS — R4689 Other symptoms and signs involving appearance and behavior: Secondary | ICD-10-CM | POA: Diagnosis not present

## 2022-01-23 LAB — COMPREHENSIVE METABOLIC PANEL
ALT: 16 U/L (ref 0–44)
AST: 18 U/L (ref 15–41)
Albumin: 3.3 g/dL — ABNORMAL LOW (ref 3.5–5.0)
Alkaline Phosphatase: 89 U/L (ref 38–126)
Anion gap: 8 (ref 5–15)
BUN: 34 mg/dL — ABNORMAL HIGH (ref 8–23)
CO2: 25 mmol/L (ref 22–32)
Calcium: 8.8 mg/dL — ABNORMAL LOW (ref 8.9–10.3)
Chloride: 106 mmol/L (ref 98–111)
Creatinine, Ser: 1.11 mg/dL (ref 0.61–1.24)
GFR, Estimated: >60 mL/min (ref >60)
Glucose, Bld: 103 mg/dL — ABNORMAL HIGH (ref 70–99)
Potassium: 4.3 mmol/L (ref 3.5–5.1)
Sodium: 139 mmol/L (ref 135–145)
Total Bilirubin: 0.5 mg/dL (ref 0.3–1.2)
Total Protein: 7 g/dL (ref 6.5–8.1)

## 2022-01-23 LAB — CBC WITH DIFFERENTIAL/PLATELET
Abs Immature Granulocytes: 0.03 10*3/uL (ref 0.00–0.07)
Basophils Absolute: 0.1 10*3/uL (ref 0.0–0.1)
Basophils Relative: 1 %
Eosinophils Absolute: 0.2 10*3/uL (ref 0.0–0.5)
Eosinophils Relative: 4 %
HCT: 39 % (ref 39.0–52.0)
Hemoglobin: 12.8 g/dL — ABNORMAL LOW (ref 13.0–17.0)
Immature Granulocytes: 1 %
Lymphocytes Relative: 31 %
Lymphs Abs: 1.7 10*3/uL (ref 0.7–4.0)
MCH: 29.1 pg (ref 26.0–34.0)
MCHC: 32.8 g/dL (ref 30.0–36.0)
MCV: 88.6 fL (ref 80.0–100.0)
Monocytes Absolute: 0.5 10*3/uL (ref 0.1–1.0)
Monocytes Relative: 9 %
Neutro Abs: 2.9 10*3/uL (ref 1.7–7.7)
Neutrophils Relative %: 54 %
Platelets: 245 10*3/uL (ref 150–400)
RBC: 4.4 MIL/uL (ref 4.22–5.81)
RDW: 13.2 % (ref 11.5–15.5)
WBC: 5.3 10*3/uL (ref 4.0–10.5)
nRBC: 0 % (ref 0.0–0.2)

## 2022-01-23 NOTE — BHH Counselor (Signed)
CSW was contacted by [facility], [place], [place], [phone].   She stated that she found out that pt has POA. She said that once POA documentation has been verified and FL2 and progress notes have been sent to their office at Fax [phone], pt will be scheduled with next steps regarding referral and/or a face to face visit to establish pt.   CSW will send out information ASAP and schedule information for potential placement.   Garwood Wentzell [place], MSW, LCSW-A 5/19/20232:35 PM

## 2022-01-23 NOTE — Progress Notes (Signed)
Patient intermittently getting up and walking around the hallways with 1:1 sitter. Patient remains safe on the unit at this time.

## 2022-01-23 NOTE — Progress Notes (Signed)
[facility] MD Progress Note  01/23/2022 11:39 AM Jay Webster  MRN:  [id number] Subjective: Jay Webster is seen on rounds.  He was more alert today and ate breakfast.  His lab work was normal.  I discontinued his Remeron and Haldol because I do not think it was making a difference.  He he continues on Wellbutrin and Provigil seems to be tolerating it pretty well.  Social work continues to look for assisted living.  He admits to depression and is indifferent when asked about suicidal ideation.  Principal Problem: Major depressive disorder, recurrent episode, severe ([place]) Diagnosis: Principal Problem:   Major depressive disorder, recurrent episode, severe (HCC) Active Problems:   Cognitive and behavioral changes  Total Time spent with patient: 15 minutes  Past Psychiatric History: None  Past Medical History:  Past Medical History:  Diagnosis Date   Arthritis    OA AND PAIN LEFT HIP AND OTHER JOINT PAINS   Cancer ([place])    THYROID CANCER - HAD THYROID REMOVED   History of kidney stones    History of shingles    RESOLVED   Hypertension    Hypothyroidism    Low back pain    Macular degeneration of both eyes    RBBB (right bundle branch block with left anterior fascicular block)    Seasonal allergies    Thyroid disease    Uric acid renal calculus 06/20/2007   Qualifier: Diagnosis of  By: Rogue Bussing CMA, Maryann Alar      Past Surgical History:  Procedure Laterality Date   APPENDECTOMY  1948   CATARACT EXTRACTION Right 08/27/2019   COLONOSCOPY     Elbow Radical Reduction  1973   IR IMAGING GUIDED PORT INSERTION  05/05/2018   IR REMOVAL TUN ACCESS W/ PORT W/O FL MOD SED  10/18/2018   [place], 1976   MASS EXCISION N/A 03/11/2018   Procedure: EXCISION MASS OF PERINEUM;  Surgeon: Armandina Gemma, MD;  Location: [facility];  Service: General;  Laterality: N/A;   REMOVAL OF FIRST RIB   NOV 1994   FOR COMPRESSED VEIN BETWEEN 1 ST RIB AND COLLARBONE   Rib removed, 1st  1994   ROTATOR  CUFF REPAIR  2009   THYROIDECTOMY  2010   TOTAL HIP ARTHROPLASTY Left 05/31/2013   Procedure: LEFT TOTAL HIP ARTHROPLASTY ANTERIOR APPROACH;  Surgeon: Gearlean Alf, MD;  Location: [facility];  Service: Orthopedics;  Laterality: Left;   Family History:  Family History  Problem Relation Age of Onset   Heart disease Mother        passed in 67s   Diabetes Mother    Alcohol abuse Father        passed from this   Parkinson's disease Sister    Diabetes Brother    Dementia Sister    Cancer Brother        sounds like bone cancer- non healing knee injury    Social History:  Social History   Substance and Sexual Activity  Alcohol Use Not Currently   Alcohol/week: 0.0 - 1.0 standard drinks     Social History   Substance and Sexual Activity  Drug Use No    Social History   Socioeconomic History   Marital status: Widowed    Spouse name: Not on file   Number of children: Not on file   Years of education: Not on file   Highest education level: Not on file  Occupational History   Occupation: retired  Tobacco Use  Smoking status: Former    Types: Cigarettes    Quit date: 02/22/1979    Years since quitting: 42.9   Smokeless tobacco: Never  Vaping Use   Vaping Use: Never used  Substance and Sexual Activity   Alcohol use: Not Currently    Alcohol/week: 0.0 - 1.0 standard drinks   Drug use: No   Sexual activity: Not Currently  Other Topics Concern   Not on file  Social History Narrative   Lives alone and 1 cat. Divorced from mongomous long term partner and eventual husband. No children from prior relationships.       Retired Geologist, engineering: socializing, used to play tennis   Social Determinants of Radio broadcast assistant Strain: Not on [employer] Insecurity: Not on file  Transportation Needs: Not on file  Physical Activity: Not on file  Stress: Not on file  Social Connections: Not on file   Additional Social History:                          Sleep: Good  Appetite:  Good  Current Medications: Current Facility-Administered Medications  Medication Dose Route Frequency Provider Last Rate Last Admin   acetaminophen (TYLENOL) tablet 650 mg  650 mg Oral Q6H PRN Deloria Lair, NP   650 mg at 01/20/22 2242   alum & mag hydroxide-simeth (MAALOX/MYLANTA) 200-200-20 MG/5ML suspension 30 mL  30 mL Oral Q4H PRN Parks Ranger, DO       buPROPion (WELLBUTRIN XL) 24 hr tablet 150 mg  150 mg Oral Daily Parks Ranger, DO   150 mg at 01/23/22 2993   donepezil (ARICEPT) tablet 5 mg  5 mg Oral QHS Parks Ranger, DO   5 mg at 01/22/22 2129   levothyroxine (SYNTHROID) tablet 125 mcg  125 mcg Oral Daily Parks Ranger, DO   125 mcg at 01/23/22 7169   LORazepam (ATIVAN) tablet 0.5 mg  0.5 mg Oral Q4H PRN Parks Ranger, DO       magnesium hydroxide (MILK OF MAGNESIA) suspension 30 mL  30 mL Oral Daily PRN Parks Ranger, DO       modafinil (PROVIGIL) tablet 200 mg  200 mg Oral Daily Parks Ranger, DO   200 mg at 01/23/22 0811   OLANZapine (ZYPREXA) tablet 5 mg  5 mg Oral Q6H PRN Parks Ranger, DO       traZODone (DESYREL) tablet 100 mg  100 mg Oral QHS PRN Parks Ranger, DO   100 mg at 01/20/22 2118    Lab Results:  Results for orders placed or performed during the hospital encounter of 01/07/22 (from the past 48 hour(s))  Comprehensive metabolic panel     Status: Abnormal   Collection Time: 01/23/22  6:30 AM  Result Value Ref Range   Sodium 139 135 - 145 mmol/L   Potassium 4.3 3.5 - 5.1 mmol/L   Chloride 106 98 - 111 mmol/L   CO2 25 22 - 32 mmol/L   Glucose, Bld 103 (H) 70 - 99 mg/dL    Comment: Glucose reference range applies only to samples taken after fasting for at least 8 hours.   BUN 34 (H) 8 - 23 mg/dL   Creatinine, Ser 1.11 0.61 - 1.24 mg/dL   Calcium 8.8 (L) 8.9 - 10.3 mg/dL   Total Protein 7.0 6.5 - 8.1 g/dL   Albumin 3.3 (L) 3.5 -  5.0  g/dL   AST 18 15 - 41 U/L   ALT 16 0 - 44 U/L   Alkaline Phosphatase 89 38 - 126 U/L   Total Bilirubin 0.5 0.3 - 1.2 mg/dL   GFR, Estimated >60 >60 mL/min    Comment: (NOTE) Calculated using the CKD-EPI Creatinine Equation (2021)    Anion gap 8 5 - 15    Comment: Performed at [facility], [address] [place], [place] [address]  CBC with Differential/Platelet     Status: Abnormal   Collection Time: 01/23/22  6:30 AM  Result Value Ref Range   WBC 5.3 4.0 - 10.5 K/uL   RBC 4.40 4.22 - 5.81 MIL/uL   Hemoglobin 12.8 (L) 13.0 - 17.0 g/dL   HCT 39.0 39.0 - 52.0 %   MCV 88.6 80.0 - 100.0 fL   MCH 29.1 26.0 - 34.0 pg   MCHC 32.8 30.0 - 36.0 g/dL   RDW 13.2 11.5 - 15.5 %   Platelets 245 150 - 400 K/uL   nRBC 0.0 0.0 - 0.2 %   Neutrophils Relative % 54 %   Neutro Abs 2.9 1.7 - 7.7 K/uL   Lymphocytes Relative 31 %   Lymphs Abs 1.7 0.7 - 4.0 K/uL   Monocytes Relative 9 %   Monocytes Absolute 0.5 0.1 - 1.0 K/uL   Eosinophils Relative 4 %   Eosinophils Absolute 0.2 0.0 - 0.5 K/uL   Basophils Relative 1 %   Basophils Absolute 0.1 0.0 - 0.1 K/uL   Immature Granulocytes 1 %   Abs Immature Granulocytes 0.03 0.00 - 0.07 K/uL    Comment: Performed at [facility], [address] [place], [place] [address]    Blood Alcohol level:  Lab Results  Component Value Date   [facility] <10 26/71/2458    Metabolic Disorder Labs: Lab Results  Component Value Date   HGBA1C 5.6 01/10/2022   MPG 114.02 01/10/2022   MPG 117 08/20/2020   No results found for: PROLACTIN Lab Results  Component Value Date   CHOL 146 01/10/2022   TRIG 52 01/10/2022   HDL 62 01/10/2022   CHOLHDL 2.4 01/10/2022   VLDL 10 01/10/2022   LDLCALC 74 01/10/2022   LDLCALC 69 02/19/2020    Physical Findings: AIMS: Facial and Oral Movements Muscles of Facial Expression: None, normal Lips and Perioral Area: None, normal Jaw: None, normal Tongue: None, normal,Extremity Movements Upper (arms, wrists,  hands, fingers): None, normal Lower (legs, knees, ankles, toes): None, normal, Trunk Movements Neck, shoulders, hips: None, normal, Overall Severity Severity of abnormal movements (highest score from questions above): None, normal Incapacitation due to abnormal movements: None, normal Patient's awareness of abnormal movements (rate only patient's report): No Awareness, Dental Status Current problems with teeth and/or dentures?: No Does patient usually wear dentures?: No  CIWA:    COWS:     Musculoskeletal: Strength & Muscle Tone: within normal limits Gait & Station: unsteady Patient leans: N/A  Psychiatric Specialty Exam:  Presentation  General Appearance: Appropriate for Environment; Casual; Neat  Eye Contact:Fair  Speech:Slow  Speech Volume:Decreased  Handedness:Right   Mood and Affect  Mood:Anxious  Affect:Constricted   Thought Process  Thought Processes:Irrevelant  Descriptions of Associations:Loose  Orientation:Partial  Thought Content:Illogical  History of Schizophrenia/Schizoaffective disorder:No data recorded Duration of Psychotic Symptoms:No data recorded Hallucinations:No data recorded Ideas of Reference:None  Suicidal Thoughts:No data recorded Homicidal Thoughts:No data recorded  Sensorium  Memory:Immediate Poor; Remote Poor  Judgment:Fair  Insight:Fair   Executive Functions  Concentration:Fair  Attention Span:Fair  [employer]   Psychomotor Activity  Psychomotor Activity:No data recorded  Assets  Assets:Resilience   Sleep  Sleep:No data recorded   Physical Exam: Physical Exam Vitals and nursing note reviewed.  Constitutional:      Appearance: Normal appearance. He is normal weight.  Neurological:     General: No focal deficit present.     Mental Status: He is alert and oriented to person, place, and time.  Psychiatric:        Attention and Perception: Attention and  perception normal.        Mood and Affect: Mood is depressed.        Speech: Speech normal.        Behavior: Behavior normal. Behavior is cooperative.        Thought Content: Thought content normal.        Cognition and Memory: Cognition and memory normal.        Judgment: Judgment normal.   Review of Systems  Constitutional: Negative.   HENT: Negative.    Eyes: Negative.   Respiratory: Negative.    Cardiovascular: Negative.   Gastrointestinal: Negative.   Genitourinary: Negative.   Musculoskeletal: Negative.   Skin: Negative.   Neurological: Negative.   Endo/Heme/Allergies: Negative.   Psychiatric/Behavioral:  Positive for depression.   Blood pressure 109/79, pulse 69, temperature 97.9 F (36.6 C), temperature source Oral, resp. rate 18, height '5\' 4"'$  (1.626 m), weight 84.3 kg, SpO2 100 %. Body mass index is 31.91 kg/m.   Treatment Plan Summary: Daily contact with patient to assess and evaluate symptoms and progress in treatment, Medication management, and Plan Continue current medications.  Parks Ranger, DO 01/23/2022, 11:39 AM

## 2022-01-23 NOTE — Progress Notes (Signed)
Pt in his room with 1:1 sitter and out 1 time only. Pt hard of hearing, confused and disorganized in thought. Pt denies HI and AVH. He states he wants to kill himself and he seems fixed on the death and dying theme. He is liable in emotion, laughing and then crying. Most of his dialogue does not make any sense. He is loud and animated at times. Pt uses a front wheel walker and requires a lot of assistant for daily activities.

## 2022-01-23 NOTE — [facility] Counselor (Signed)
CSW talked with pt's niece Jay Webster regarding discharge planning. She stated that pt does have a power of attorney,Power of attorney is Jay Webster, [phone].   Attorney's office information:  Jay Webster, [phone] [facility], [place] She stated that the office needed to the following before POA documentation information could be released: Power of attorney documentation can be released to the designated power of attorney if pt agrees to it.  CSW has to talk with pt regarding power of attorney and allowing release of documentation and or provider stating that pt is incompetent and not able to released.   No other requests made. Conversation ended without incident.   Jay Webster, MSW, LCSW-A 5/19/20232:30 PM

## 2022-01-23 NOTE — Progress Notes (Signed)
Patient denies SI, HI, & AVH., & depression. He remains on 1:1 with sitter at bedside for safety and redirection.

## 2022-01-23 NOTE — NC FL2 (Signed)
[place] LEVEL OF CARE SCREENING TOOL     IDENTIFICATION  Patient Name: Jay Webster Birthdate: 09/26/1931 Sex: male Admission Date (Current Location): 01/07/2022  [facility] and [place] Number:  Engineering geologist and Address:  [facility], [address] [place], [place] [address]      Provider Number: [id number]  Attending Physician Name and Address:  Parks Ranger,*  Relative Name and Phone Number:  Ellis Parents [facility]), [phone] and Nevin Bloodgood [place](Niece), [phone]    Current Level of Care: Hospital Recommended Level of Care: Memory Care, [place] Prior Approval Number:    Date Approved/Denied:   PASRR Number:    Discharge Plan: Other (Comment) (ALF, NF, [facility])    Current Diagnoses: Patient Active Problem List   Diagnosis Date Noted   Major depressive disorder, recurrent episode, severe ([place]) 01/08/2022   Cognitive and behavioral changes 01/07/2022   Abdominal pain 10/30/2021   Open fracture of tuft of distal phalanx of finger 09/16/2021   Depression, major, single episode, moderate ([place]) 03/26/2021   Decreased hearing of both ears 03/26/2021   Decreased visual acuity 03/26/2021   Pulmonary emphysema, unspecified emphysema type ([place]) 02/19/2020   Atherosclerosis of aorta ([place]) 02/19/2020   Memory loss 02/19/2020   Pseudophakia of both eyes 12/13/2019   Former smoker 08/16/2019   Depression, major, single episode, mild ([place]) 08/16/2019   Diffuse large B-cell lymphoma ([place]) 03/30/2018   Degeneration of lumbar intervertebral disc 10/05/2017   History of total hip arthroplasty 10/05/2017   OA (osteoarthritis) of hip 05/31/2013   History of thyroid cancer 04/21/2011   Macular degeneration 01/05/2011   HYPOTHYROIDISM, POSTSURGICAL 11/20/2008   Osteoarthritis 07/01/2007   Uric acid renal calculus 06/20/2007   Essential hypertension 06/20/2007    Orientation RESPIRATION BLADDER Height &  Weight     Self, Place  Normal Incontinent (occasionally, wears briefs) Weight: 185 lb 14.3 oz (84.3 kg) Height:  '5\' 4"'$  (162.6 cm)  BEHAVIORAL SYMPTOMS/MOOD NEUROLOGICAL BOWEL NUTRITION STATUS  Other (Comment) (Redirectable, follows verbal commands)  (N/A) Incontinent (ocassionally, wears briefs) Diet (normal)  AMBULATORY STATUS COMMUNICATION OF NEEDS Skin   Limited Assist (Patient uses 2 front wheel walker) Verbally Normal                       Personal Care Assistance Level of Assistance  Bathing, Dressing Bathing Assistance: Maximum assistance ("fall risk")   Dressing Assistance: Maximum assistance     Functional Limitations Info  Sight Sight Info: Impaired (Poor vision) Hearing Info: Impaired (hard of hearing, does not have hearing aids)      SPECIAL CARE FACTORS FREQUENCY  PT (By licensed PT)     PT Frequency: Patient has been receiving PT inpatient but is at baseline and did not recomment PT continuing              Contractures Contractures Info: Not present    Additional Factors Info  Code Status Code Status Info: DNR             Current Medications (01/23/2022):  This is the current hospital active medication list Current Facility-Administered Medications  Medication Dose Route Frequency Provider Last Rate Last Admin   acetaminophen (TYLENOL) tablet 650 mg  650 mg Oral Q6H PRN Deloria Lair, NP   650 mg at 01/20/22 2242   alum & mag hydroxide-simeth (MAALOX/MYLANTA) 200-200-20 MG/5ML suspension 30 mL  30 mL Oral Q4H PRN Parks Ranger, DO  buPROPion (WELLBUTRIN XL) 24 hr tablet 150 mg  150 mg Oral Daily Parks Ranger, DO   150 mg at 01/23/22 3903   donepezil (ARICEPT) tablet 5 mg  5 mg Oral QHS Parks Ranger, DO   5 mg at 01/22/22 2129   levothyroxine (SYNTHROID) tablet 125 mcg  125 mcg Oral Daily Parks Ranger, DO   125 mcg at 01/23/22 0092   LORazepam (ATIVAN) tablet 0.5 mg  0.5 mg Oral Q4H PRN Parks Ranger, DO       magnesium hydroxide (MILK OF MAGNESIA) suspension 30 mL  30 mL Oral Daily PRN Parks Ranger, DO       modafinil (PROVIGIL) tablet 200 mg  200 mg Oral Daily Parks Ranger, DO   200 mg at 01/23/22 0811   OLANZapine (ZYPREXA) tablet 5 mg  5 mg Oral Q6H PRN Parks Ranger, DO       traZODone (DESYREL) tablet 100 mg  100 mg Oral QHS PRN Parks Ranger, DO   100 mg at 01/20/22 2118     Discharge Medications: Please see discharge summary for a list of discharge medications.  Relevant Imaging Results:  Relevant Lab Results:   Additional Information Memory impairment  Beula Joyner A [place], LCSWA

## 2022-01-23 NOTE — Plan of Care (Signed)
  Problem: Education: Goal: Knowledge of [place] [facility] Education information/materials will improve Outcome: Progressing Goal: Emotional status will improve Outcome: Progressing Goal: Mental status will improve Outcome: Progressing Goal: Verbalization of understanding the information provided will improve Outcome: Progressing   Problem: Activity: Goal: Interest or engagement in activities will improve Outcome: Progressing Goal: Sleeping patterns will improve Outcome: Progressing   Problem: Coping: Goal: Ability to verbalize frustrations and anger appropriately will improve Outcome: Progressing Goal: Ability to demonstrate self-control will improve Outcome: Progressing   Problem: Health Behavior/Discharge Planning: Goal: Identification of resources available to assist in meeting health care needs will improve Outcome: Progressing Goal: Compliance with treatment plan for underlying cause of condition will improve Outcome: Progressing   Problem: Physical Regulation: Goal: Ability to maintain clinical measurements within normal limits will improve Outcome: Progressing   Problem: Safety: Goal: Periods of time without injury will increase Outcome: Progressing   Problem: Education: Goal: Ability to make informed decisions regarding treatment will improve Outcome: Progressing   Problem: Health Behavior/Discharge Planning: Goal: Identification of resources available to assist in meeting health care needs will improve Outcome: Progressing   Problem: Medication: Goal: Compliance with prescribed medication regimen will improve Outcome: Progressing   Problem: Self-Concept: Goal: Will verbalize positive feelings about self Outcome: Progressing   Problem: Activity: Goal: Will identify at least one activity in which they can participate Outcome: Progressing   Problem: Coping: Goal: Ability to identify and develop effective coping behavior will improve Outcome:  Progressing Goal: Ability to interact with others will improve Outcome: Progressing Goal: Demonstration of participation in decision-making regarding own care will improve Outcome: Progressing Goal: Ability to use eye contact when communicating with others will improve Outcome: Progressing   Problem: Health Behavior/Discharge Planning: Goal: Identification of resources available to assist in meeting health care needs will improve Outcome: Progressing   Problem: Self-Concept: Goal: Will verbalize positive feelings about self Outcome: Progressing

## 2022-01-23 NOTE — Progress Notes (Signed)
Patient denies SI, HI, and AVH. He also denies pain and other physical concerns. Patient ate breakfast in the dayroom and is currently resting in recliner chair. Respirations are even and unlabored. Patient remains with 1:1 sitter for safety.

## 2022-01-23 NOTE — Progress Notes (Signed)
Patient is calm, sitting in a recliner in the dayroom. He is observed to be speaking out loud and folding a blanket. Patient remains on 1:1 for safety.

## 2022-01-24 ENCOUNTER — Other Ambulatory Visit (HOSPITAL_BASED_OUTPATIENT_CLINIC_OR_DEPARTMENT_OTHER): Payer: Self-pay | Admitting: Family Medicine

## 2022-01-24 DIAGNOSIS — F332 Major depressive disorder, recurrent severe without psychotic features: Secondary | ICD-10-CM | POA: Diagnosis not present

## 2022-01-24 NOTE — Progress Notes (Signed)
Pt in bed asleep, with 1:1 sitter at bedside.

## 2022-01-24 NOTE — Progress Notes (Signed)
D: Pt alert and oriented to person. Pt denies experiencing any anxiety/depression at this time. Pt denies experiencing any pain at this time. Pt denies experiencing any SI/HI, or AVH at this time, assigned 1:1 staff posted within arms length of patient d/t high falls risk and intermittent confusion. Pt w/ adequate meal and fluid intake this shift, remains complaint w/ medication and unit routine.    A: Scheduled medications administered to patient as prescribed. Support and encouragement provided. Frequent verbal contact made. Routine safety checks conducted q15 minutes .  R: No adverse drug reactions noted. Pt verbally contracts for safety at this time. Pt complaint with medications. Pt interacts minimally with others on the unit. Pt remains safe at this time. Will continue to monitor.

## 2022-01-24 NOTE — Progress Notes (Signed)
[facility] MD Progress Note  01/24/2022 3:24 PM Jay Webster  MRN:  [id number] Subjective: pt seen and chart reviewed.  Jay Webster is eating lunch in day room. Jay Webster reports feeling "[place]", sitter reports Jay Webster has been cooperative, and redirectable.   Denied SI.   Principal Problem: Major depressive disorder, recurrent episode, severe ([place]) Diagnosis: Principal Problem:   Major depressive disorder, recurrent episode, severe ([place]) Active Problems:   Cognitive and behavioral changes  Total Time spent with patient: 30 minutes  Past Psychiatric History: None  Past Medical History:  Past Medical History:  Diagnosis Date   Arthritis    OA AND PAIN LEFT HIP AND OTHER JOINT PAINS   Cancer ([place])    THYROID CANCER - HAD THYROID REMOVED   History of kidney stones    History of shingles    RESOLVED   Hypertension    Hypothyroidism    Low back pain    Macular degeneration of both eyes    RBBB (right bundle branch block with left anterior fascicular block)    Seasonal allergies    Thyroid disease    Uric acid renal calculus 06/20/2007   Qualifier: Diagnosis of  By: Rogue Bussing CMA, Maryann Alar      Past Surgical History:  Procedure Laterality Date   APPENDECTOMY  1948   CATARACT EXTRACTION Right 08/27/2019   COLONOSCOPY     Elbow Radical Reduction  1973   IR IMAGING GUIDED PORT INSERTION  05/05/2018   IR REMOVAL TUN ACCESS W/ PORT W/O FL MOD SED  10/18/2018   [place], 1976   MASS EXCISION N/A 03/11/2018   Procedure: EXCISION MASS OF PERINEUM;  Surgeon: Armandina Gemma, MD;  Location: [facility];  Service: General;  Laterality: N/A;   REMOVAL OF FIRST RIB   NOV 1994   FOR COMPRESSED VEIN BETWEEN 1 ST RIB AND COLLARBONE   Rib removed, 1st  1994   ROTATOR CUFF REPAIR  2009   THYROIDECTOMY  2010   TOTAL HIP ARTHROPLASTY Left 05/31/2013   Procedure: LEFT TOTAL HIP ARTHROPLASTY ANTERIOR APPROACH;  Surgeon: Gearlean Alf, MD;  Location: [facility];  Service: Orthopedics;  Laterality: Left;    Family History:  Family History  Problem Relation Age of Onset   Heart disease Mother        passed in 28s   Diabetes Mother    Alcohol abuse Father        passed from this   Parkinson's disease Sister    Diabetes Brother    Dementia Sister    Cancer Brother        sounds like bone cancer- non healing knee injury    Social History:  Social History   Substance and Sexual Activity  Alcohol Use Not Currently   Alcohol/week: 0.0 - 1.0 standard drinks     Social History   Substance and Sexual Activity  Drug Use No    Social History   Socioeconomic History   Marital status: Widowed    Spouse name: Not on file   Number of children: Not on file   Years of education: Not on file   Highest education level: Not on file  Occupational History   Occupation: retired  Tobacco Use   Smoking status: Former    Types: Cigarettes    Quit date: 02/22/1979    Years since quitting: 42.9   Smokeless tobacco: Never  Vaping Use   Vaping Use: Never used  Substance and Sexual Activity  Alcohol use: Not Currently    Alcohol/week: 0.0 - 1.0 standard drinks   Drug use: No   Sexual activity: Not Currently  Other Topics Concern   Not on file  Social History Narrative   Lives alone and 1 cat. Divorced from mongomous long term partner and eventual husband. No children from prior relationships.       Retired Geologist, engineering: socializing, used to play tennis   Social Determinants of Radio broadcast assistant Strain: Not on [employer] Insecurity: Not on file  Transportation Needs: Not on file  Physical Activity: Not on file  Stress: Not on file  Social Connections: Not on file   Additional Social History:   Sleep: Good  Appetite:  Good  Current Medications: Current Facility-Administered Medications  Medication Dose Route Frequency Provider Last Rate Last Admin   acetaminophen (TYLENOL) tablet 650 mg  650 mg Oral Q6H PRN Deloria Lair, NP   650 mg at  01/20/22 2242   alum & mag hydroxide-simeth (MAALOX/MYLANTA) 200-200-20 MG/5ML suspension 30 mL  30 mL Oral Q4H PRN Parks Ranger, DO       buPROPion (WELLBUTRIN XL) 24 hr tablet 150 mg  150 mg Oral Daily Parks Ranger, DO   150 mg at 01/24/22 0930   donepezil (ARICEPT) tablet 5 mg  5 mg Oral QHS Parks Ranger, DO   5 mg at 01/23/22 2134   levothyroxine (SYNTHROID) tablet 125 mcg  125 mcg Oral Daily Parks Ranger, DO   125 mcg at 01/24/22 6222   LORazepam (ATIVAN) tablet 0.5 mg  0.5 mg Oral Q4H PRN Parks Ranger, DO       magnesium hydroxide (MILK OF MAGNESIA) suspension 30 mL  30 mL Oral Daily PRN Parks Ranger, DO       modafinil (PROVIGIL) tablet 200 mg  200 mg Oral Daily Parks Ranger, DO   200 mg at 01/24/22 0930   OLANZapine (ZYPREXA) tablet 5 mg  5 mg Oral Q6H PRN Parks Ranger, DO       traZODone (DESYREL) tablet 100 mg  100 mg Oral QHS PRN Parks Ranger, DO   100 mg at 01/20/22 2118    Lab Results:  Results for orders placed or performed during the hospital encounter of 01/07/22 (from the past 48 hour(s))  Comprehensive metabolic panel     Status: Abnormal   Collection Time: 01/23/22  6:30 AM  Result Value Ref Range   Sodium 139 135 - 145 mmol/L   Potassium 4.3 3.5 - 5.1 mmol/L   Chloride 106 98 - 111 mmol/L   CO2 25 22 - 32 mmol/L   Glucose, Bld 103 (H) 70 - 99 mg/dL    Comment: Glucose reference range applies only to samples taken after fasting for at least 8 hours.   BUN 34 (H) 8 - 23 mg/dL   Creatinine, Ser 1.11 0.61 - 1.24 mg/dL   Calcium 8.8 (L) 8.9 - 10.3 mg/dL   Total Protein 7.0 6.5 - 8.1 g/dL   Albumin 3.3 (L) 3.5 - 5.0 g/dL   AST 18 15 - 41 U/L   ALT 16 0 - 44 U/L   Alkaline Phosphatase 89 38 - 126 U/L   Total Bilirubin 0.5 0.3 - 1.2 mg/dL   GFR, Estimated >60 >60 mL/min    Comment: (NOTE) Calculated using the CKD-EPI Creatinine Equation (2021)    Anion gap 8 5 - 15  Comment: Performed at [facility], [address] [place], [place] [address]  CBC with Differential/Platelet     Status: Abnormal   Collection Time: 01/23/22  6:30 AM  Result Value Ref Range   WBC 5.3 4.0 - 10.5 K/uL   RBC 4.40 4.22 - 5.81 MIL/uL   Hemoglobin 12.8 (L) 13.0 - 17.0 g/dL   HCT 39.0 39.0 - 52.0 %   MCV 88.6 80.0 - 100.0 fL   MCH 29.1 26.0 - 34.0 pg   MCHC 32.8 30.0 - 36.0 g/dL   RDW 13.2 11.5 - 15.5 %   Platelets 245 150 - 400 K/uL   nRBC 0.0 0.0 - 0.2 %   Neutrophils Relative % 54 %   Neutro Abs 2.9 1.7 - 7.7 K/uL   Lymphocytes Relative 31 %   Lymphs Abs 1.7 0.7 - 4.0 K/uL   Monocytes Relative 9 %   Monocytes Absolute 0.5 0.1 - 1.0 K/uL   Eosinophils Relative 4 %   Eosinophils Absolute 0.2 0.0 - 0.5 K/uL   Basophils Relative 1 %   Basophils Absolute 0.1 0.0 - 0.1 K/uL   Immature Granulocytes 1 %   Abs Immature Granulocytes 0.03 0.00 - 0.07 K/uL    Comment: Performed at [facility], [address] [place], [place] [address]    Blood Alcohol level:  Lab Results  Component Value Date   [facility] <10 09/81/1914    Metabolic Disorder Labs: Lab Results  Component Value Date   HGBA1C 5.6 01/10/2022   MPG 114.02 01/10/2022   MPG 117 08/20/2020   No results found for: PROLACTIN Lab Results  Component Value Date   CHOL 146 01/10/2022   TRIG 52 01/10/2022   HDL 62 01/10/2022   CHOLHDL 2.4 01/10/2022   VLDL 10 01/10/2022   LDLCALC 74 01/10/2022   LDLCALC 69 02/19/2020    Physical Findings: AIMS: Facial and Oral Movements Muscles of Facial Expression: None, normal Lips and Perioral Area: None, normal Jaw: None, normal Tongue: None, normal,Extremity Movements Upper (arms, wrists, hands, fingers): None, normal Lower (legs, knees, ankles, toes): None, normal, Trunk Movements Neck, shoulders, hips: None, normal, Overall Severity Severity of abnormal movements (highest score from questions above): None, normal Incapacitation due to  abnormal movements: None, normal Patient's awareness of abnormal movements (rate only patient's report): No Awareness, Dental Status Current problems with teeth and/or dentures?: No Does patient usually wear dentures?: No  CIWA:    COWS:     Musculoskeletal: Strength & Muscle Tone: within normal limits Gait & Station: unsteady Patient leans: N/A  Psychiatric Specialty Exam:  Presentation  General Appearance: Appropriate for Environment; Casual; Neat  Eye Contact:Fair  Speech:Slow  Speech Volume:Decreased  Handedness:Right   Mood and Affect  Mood:Anxious  Affect:Constricted   Thought Process  Thought Processes:Irrevelant  Descriptions of Associations:Loose  Orientation:Partial  Thought Content:Illogical  History of Schizophrenia/Schizoaffective disorder:No data recorded Duration of Psychotic Symptoms:No data recorded Hallucinations:No data recorded Ideas of Reference:None  Suicidal Thoughts:No data recorded Homicidal Thoughts:No data recorded  Sensorium  Memory:Immediate Poor; Remote Poor  Judgment:Fair  Insight:Fair   Executive Functions  Concentration:Fair  Attention Span:Fair  [place]   Psychomotor Activity  Psychomotor Activity:No data recorded  Assets  Assets:Resilience   Sleep  Sleep:No data recorded   Physical Exam: Physical Exam Vitals and nursing note reviewed.  Constitutional:      Appearance: Normal appearance. Jay Webster is normal weight.  Neurological:     General: No focal deficit  present.     Mental Status: Jay Webster is alert and oriented to person, place, and time.  Psychiatric:        Attention and Perception: Attention and perception normal.        Mood and Affect: Mood is depressed.        Speech: Speech normal.        Behavior: Behavior normal. Behavior is cooperative.        Thought Content: Thought content normal.        Cognition and Memory: Cognition and memory normal.         Judgment: Judgment normal.   Review of Systems  Constitutional: Negative.   HENT: Negative.    Eyes: Negative.   Respiratory: Negative.    Cardiovascular: Negative.   Gastrointestinal: Negative.   Genitourinary: Negative.   Musculoskeletal: Negative.   Skin: Negative.   Neurological: Negative.   Endo/Heme/Allergies: Negative.   Psychiatric/Behavioral:  Positive for depression.   Blood pressure (!) 142/80, pulse 73, temperature 97.9 F (36.6 C), temperature source Oral, resp. rate 20, height '5\' 4"'$  (1.626 m), weight 84.3 kg, SpO2 98 %. Body mass index is 31.91 kg/m.   Treatment Plan Summary: Daily contact with patient to assess and evaluate symptoms and progress in treatment, Medication management, and Plan Continue current medications.  MDD, severe, recurrent -- continue Wellbutrin XL '150mg'$  daily  -- continue Modafinil '200mg'$  daily.   Cognitive impairment -- continue Aricept '5mg'$  daily.   Sai Moura, MD 01/24/2022, 3:24 PM

## 2022-01-24 NOTE — Progress Notes (Signed)
Pt in bed asleep with 1:1 sitter at bedside.

## 2022-01-24 NOTE — BH IP Treatment Plan (Signed)
Interdisciplinary Treatment and Diagnostic Plan Update  01/24/2022 Time of Session: 8:45PM Lott Seelbach II MRN: [id number]  Principal Diagnosis: Major depressive disorder, recurrent episode, severe ([place])  Secondary Diagnoses: Principal Problem:   Major depressive disorder, recurrent episode, severe ([place]) Active Problems:   Cognitive and behavioral changes   Current Medications:  Current Facility-Administered Medications  Medication Dose Route Frequency Provider Last Rate Last Admin   acetaminophen (TYLENOL) tablet 650 mg  650 mg Oral Q6H PRN Deloria Lair, NP   650 mg at 01/20/22 2242   alum & mag hydroxide-simeth (MAALOX/MYLANTA) 200-200-20 MG/5ML suspension 30 mL  30 mL Oral Q4H PRN Parks Ranger, DO       buPROPion (WELLBUTRIN XL) 24 hr tablet 150 mg  150 mg Oral Daily Parks Ranger, DO   150 mg at 01/23/22 7893   donepezil (ARICEPT) tablet 5 mg  5 mg Oral QHS Parks Ranger, DO   5 mg at 01/23/22 2134   levothyroxine (SYNTHROID) tablet 125 mcg  125 mcg Oral Daily Parks Ranger, DO   125 mcg at 01/24/22 8101   LORazepam (ATIVAN) tablet 0.5 mg  0.5 mg Oral Q4H PRN Parks Ranger, DO       magnesium hydroxide (MILK OF MAGNESIA) suspension 30 mL  30 mL Oral Daily PRN Parks Ranger, DO       modafinil (PROVIGIL) tablet 200 mg  200 mg Oral Daily Parks Ranger, DO   200 mg at 01/23/22 0811   OLANZapine (ZYPREXA) tablet 5 mg  5 mg Oral Q6H PRN Parks Ranger, DO       traZODone (DESYREL) tablet 100 mg  100 mg Oral QHS PRN Parks Ranger, DO   100 mg at 01/20/22 2118   PTA Medications: Medications Prior to Admission  Medication Sig Dispense Refill Last Dose   doxycycline (VIBRAMYCIN) 100 MG capsule Take 1 capsule (100 mg total) by mouth 2 (two) times daily. (Patient not taking: Reported on 01/06/2022) 20 capsule 0    levothyroxine (SYNTHROID) 125 MCG tablet TAKE 1 TABLET BY MOUTH EVERY DAY (Patient  taking differently: Take 125 mcg by mouth daily before breakfast.) 30 tablet 0     Patient Stressors: Health problems   Medication change or noncompliance    Patient Strengths: Scientist, research (life sciences)  Special hobby/interest  Supportive family/friends   Treatment Modalities: Medication Management, Group therapy, Case management,  1 to 1 session with clinician, Psychoeducation, Recreational therapy.   Physician Treatment Plan for Primary Diagnosis: Major depressive disorder, recurrent episode, severe ([place]) Long Term Goal(s): Improvement in symptoms so as ready for discharge   Short Term Goals: Ability to identify changes in lifestyle to reduce recurrence of condition will improve Ability to verbalize feelings will improve Ability to disclose and discuss suicidal ideas Ability to demonstrate self-control will improve Ability to identify and develop effective coping behaviors will improve Ability to maintain clinical measurements within normal limits will improve Compliance with prescribed medications will improve Ability to identify triggers associated with substance abuse/mental health issues will improve  Medication Management: Evaluate patient's response, side effects, and tolerance of medication regimen.  Therapeutic Interventions: 1 to 1 sessions, Unit Group sessions and Medication administration.  Evaluation of Outcomes: Progressing  Physician Treatment Plan for Secondary Diagnosis: Principal Problem:   Major depressive disorder, recurrent episode, severe ([place]) Active Problems:   Cognitive and behavioral changes  Long Term Goal(s): Improvement in symptoms so as ready for discharge   Short Term Goals: Ability to  identify changes in lifestyle to reduce recurrence of condition will improve Ability to verbalize feelings will improve Ability to disclose and discuss suicidal ideas Ability to demonstrate self-control will improve Ability to identify and develop effective coping  behaviors will improve Ability to maintain clinical measurements within normal limits will improve Compliance with prescribed medications will improve Ability to identify triggers associated with substance abuse/mental health issues will improve     Medication Management: Evaluate patient's response, side effects, and tolerance of medication regimen.  Therapeutic Interventions: 1 to 1 sessions, Unit Group sessions and Medication administration.  Evaluation of Outcomes: Progressing   RN Treatment Plan for Primary Diagnosis: Major depressive disorder, recurrent episode, severe ([place]) Long Term Goal(s): Knowledge of disease and therapeutic regimen to maintain health will improve  Short Term Goals: Ability to remain free from injury will improve, Ability to verbalize frustration and anger appropriately will improve, Ability to demonstrate self-control, Ability to participate in decision making will improve, Ability to verbalize feelings will improve, Ability to disclose and discuss suicidal ideas, Ability to identify and develop effective coping behaviors will improve, and Compliance with prescribed medications will improve  Medication Management: RN will administer medications as ordered by provider, will assess and evaluate patient's response and provide education to patient for prescribed medication. RN will report any adverse and/or side effects to prescribing provider.  Therapeutic Interventions: 1 on 1 counseling sessions, Psychoeducation, Medication administration, Evaluate responses to treatment, Monitor vital signs and CBGs as ordered, Perform/monitor CIWA, COWS, AIMS and Fall Risk screenings as ordered, Perform wound care treatments as ordered.  Evaluation of Outcomes: Progressing   LCSW Treatment Plan for Primary Diagnosis: Major depressive disorder, recurrent episode, severe ([place]) Long Term Goal(s): Safe transition to appropriate next level of care at discharge, Engage patient in  therapeutic group addressing interpersonal concerns.  Short Term Goals: Engage patient in aftercare planning with referrals and resources, Increase social support, Increase ability to appropriately verbalize feelings, Increase emotional regulation, Facilitate acceptance of mental health diagnosis and concerns, Facilitate patient progression through stages of change regarding substance use diagnoses and concerns, Identify triggers associated with mental health/substance abuse issues, and Increase skills for wellness and recovery  Therapeutic Interventions: Assess for all discharge needs, 1 to 1 time with Social worker, Explore available resources and support systems, Assess for adequacy in community support network, Educate family and significant other(s) on suicide prevention, Complete Psychosocial Assessment, Interpersonal group therapy.  Evaluation of Outcomes: Progressing   Progress in Treatment: Attending groups: No. Participating in groups: No. Taking medication as prescribed: Yes. Toleration medication: Yes. Family/Significant other contact made: Yes, individual(s) contacted:  SPE completed with Isaac Laud, niece, [phone] Patient understands diagnosis: No. Discussing patient identified problems/goals with staff: Yes. Medical problems stabilized or resolved: Yes. Denies suicidal/homicidal ideation: No. Issues/concerns per patient self-inventory: Yes. Other: none.  New problem(s) identified: No additional problems/concerns identified at this time. Update 01/24/2022: No changes at this time.   New Short Term/Long Term Goal(s): Patient to work towards medication management for mood stabilization; development of comprehensive mental wellness plan. Update 01/24/2022: No changes at this time.   Patient Goals:  No additional goals identified at this time. Patient to continue to work towards original goals identified in initial treatment team meeting. CSW will remain available to  patient should they voice additional treatment goals. Update 01/24/2022: No changes at this time.    Discharge Plan or Barriers: No psychosocial barriers identified at this time, patient to return to place of residence when appropriate for discharge.  Update 01/24/2022: CSW team is assisting patient with placement to Memory Care Unit or [place].   Reason for Continuation of Hospitalization: Depression Medication stabilization  Estimated Length of Stay: TBD  Last 3 [place] Suicide Severity Risk Score: [place] Admission (Current) from 01/07/2022 in [facility] from 01/05/2022 in [facility] from 10/27/2021 in [facility]  C-SSRS RISK CATEGORY High Risk High Risk No Risk       Last PHQ 2/9 Scores:    12/22/2021    4:09 PM 01/21/2021    1:16 PM 08/20/2020    8:18 AM  Depression screen PHQ 2/9  Decreased Interest 0 3 0  Down, Depressed, Hopeless '1 3 1  '$ PHQ - 2 Score '1 6 1  '$ Altered sleeping  0 0  Tired, decreased energy  1 0  Change in appetite  0 0  Feeling bad or failure about yourself   3 0  Trouble concentrating  2 0  Moving slowly or fidgety/restless  1 0  Suicidal thoughts  0 1  PHQ-9 Score  13 2  Difficult doing work/chores  Somewhat difficult Not difficult at all    Scribe for Treatment Team: Berniece Salines, Latanya Presser 01/24/2022 8:55 AM

## 2022-01-24 NOTE — Progress Notes (Signed)
Pt had difficulty falling to sleep and staying asleep. He talks very loud at times and he is hard of hearing as well. Sitter a bedside.

## 2022-01-24 NOTE — Progress Notes (Signed)
Pt in bed awake, with sitter at bedside. No signs of emotional or physical distress.

## 2022-01-25 DIAGNOSIS — F332 Major depressive disorder, recurrent severe without psychotic features: Secondary | ICD-10-CM | POA: Diagnosis not present

## 2022-01-25 NOTE — Group Note (Signed)
LCSW Group Therapy Note  Group Date: 01/25/2022 Start Time: 1400 End Time: 1500   Type of Therapy and Topic:  Group Therapy - Healthy vs Unhealthy Coping Skills  Participation Level:  Minimal   Description of Group The focus of this group was to determine what unhealthy coping techniques typically are used by group members and what healthy coping techniques would be helpful in coping with various problems. Patients were guided in becoming aware of the differences between healthy and unhealthy coping techniques. Patients were asked to identify 2-3 healthy coping skills they would like to learn to use more effectively.  Therapeutic Goals Patients learned that coping is what human beings do all day long to deal with various situations in their lives Patients defined and discussed healthy vs unhealthy coping techniques Patients identified their preferred coping techniques and identified whether these were healthy or unhealthy Patients determined 2-3 healthy coping skills they would like to become more familiar with and use more often. Patients provided support and ideas to each other   Summary of Patient Progress:  Patient presented for the first half of group and participated in the icebreaker activity. Patient identified an unhealthy coping skill as "jumping out in front of a moving truck." Patient appeared to have difficulty hearing other group members share despite CSW's attempts to engage patient. Patient left group early.   Therapeutic Modalities Cognitive Behavioral Therapy Motivational Interviewing  Berniece Salines, Latanya Presser 01/25/2022  4:18 PM

## 2022-01-25 NOTE — Progress Notes (Signed)
   01/24/22 2200  Psych Admission Type (Psych Patients Only)  Admission Status Involuntary  Psychosocial Assessment  Patient Complaints Disorientation  Eye Contact Fair  Facial Expression Animated;Sad  Affect Depressed  Speech Incoherent  Interaction Assertive  Motor Activity Slow  Appearance/Hygiene Disheveled;In scrubs  Behavior Characteristics Irritable  Mood Depressed  Thought Process  Coherency Disorganized  Content Preoccupation  Delusions WDL  Perception WDL  Hallucination None reported or observed  Judgment Impaired  Confusion Moderate  Danger to Self  Current suicidal ideation? Denies  Agreement Not to Harm Self Yes  Danger to Others  Danger to Others None reported or observed

## 2022-01-25 NOTE — Progress Notes (Signed)
[facility] MD Progress Note  01/25/2022 12:01 PM Jay Webster  MRN:  [id number] Subjective: pt seen and chart reviewed.  Jay Webster is resting after a shower, reporting doing well. Sitter reports that Jay Webster has been doing well, seems to be more steady on his feet. Minimal agitation.    Denied SI.   Principal Problem: Major depressive disorder, recurrent episode, severe ([place]) Diagnosis: Principal Problem:   Major depressive disorder, recurrent episode, severe ([place]) Active Problems:   Cognitive and behavioral changes  Total Time spent with patient: 30 minutes  Past Psychiatric History: None  Past Medical History:  Past Medical History:  Diagnosis Date   Arthritis    OA AND PAIN LEFT HIP AND OTHER JOINT PAINS   Cancer ([place])    THYROID CANCER - HAD THYROID REMOVED   History of kidney stones    History of shingles    RESOLVED   Hypertension    Hypothyroidism    Low back pain    Macular degeneration of both eyes    RBBB (right bundle branch block with left anterior fascicular block)    Seasonal allergies    Thyroid disease    Uric acid renal calculus 06/20/2007   Qualifier: Diagnosis of  By: Rogue Bussing CMA, Maryann Alar      Past Surgical History:  Procedure Laterality Date   APPENDECTOMY  1948   CATARACT EXTRACTION Right 08/27/2019   COLONOSCOPY     Elbow Radical Reduction  1973   IR IMAGING GUIDED PORT INSERTION  05/05/2018   IR REMOVAL TUN ACCESS W/ PORT W/O FL MOD SED  10/18/2018   [place], 1976   MASS EXCISION N/A 03/11/2018   Procedure: EXCISION MASS OF PERINEUM;  Surgeon: Armandina Gemma, MD;  Location: [facility];  Service: General;  Laterality: N/A;   REMOVAL OF FIRST RIB   NOV 1994   FOR COMPRESSED VEIN BETWEEN 1 ST RIB AND COLLARBONE   Rib removed, 1st  1994   ROTATOR CUFF REPAIR  2009   THYROIDECTOMY  2010   TOTAL HIP ARTHROPLASTY Left 05/31/2013   Procedure: LEFT TOTAL HIP ARTHROPLASTY ANTERIOR APPROACH;  Surgeon: Gearlean Alf, MD;  Location: [facility];  Service:  Orthopedics;  Laterality: Left;   Family History:  Family History  Problem Relation Age of Onset   Heart disease Mother        passed in 6s   Diabetes Mother    Alcohol abuse Father        passed from this   Parkinson's disease Sister    Diabetes Brother    Dementia Sister    Cancer Brother        sounds like bone cancer- non healing knee injury    Social History:  Social History   Substance and Sexual Activity  Alcohol Use Not Currently   Alcohol/week: 0.0 - 1.0 standard drinks     Social History   Substance and Sexual Activity  Drug Use No    Social History   Socioeconomic History   Marital status: Widowed    Spouse name: Not on file   Number of children: Not on file   Years of education: Not on file   Highest education level: Not on file  Occupational History   Occupation: retired  Tobacco Use   Smoking status: Former    Types: Cigarettes    Quit date: 02/22/1979    Years since quitting: 42.9   Smokeless tobacco: Never  Vaping Use   Vaping Use:  Never used  Substance and Sexual Activity   Alcohol use: Not Currently    Alcohol/week: 0.0 - 1.0 standard drinks   Drug use: No   Sexual activity: Not Currently  Other Topics Concern   Not on file  Social History Narrative   Lives alone and 1 cat. Divorced from mongomous long term partner and eventual husband. No children from prior relationships.       Retired Geologist, engineering: socializing, used to play tennis   Social Determinants of Radio broadcast assistant Strain: Not on [employer] Insecurity: Not on file  Transportation Needs: Not on file  Physical Activity: Not on file  Stress: Not on file  Social Connections: Not on file   Additional Social History:   Sleep: Good  Appetite:  Good  Current Medications: Current Facility-Administered Medications  Medication Dose Route Frequency Provider Last Rate Last Admin   acetaminophen (TYLENOL) tablet 650 mg  650 mg Oral Q6H PRN  Deloria Lair, NP   650 mg at 01/20/22 2242   alum & mag hydroxide-simeth (MAALOX/MYLANTA) 200-200-20 MG/5ML suspension 30 mL  30 mL Oral Q4H PRN Parks Ranger, DO       buPROPion (WELLBUTRIN XL) 24 hr tablet 150 mg  150 mg Oral Daily Parks Ranger, DO   150 mg at 01/25/22 0913   donepezil (ARICEPT) tablet 5 mg  5 mg Oral QHS Parks Ranger, DO   5 mg at 01/24/22 2100   levothyroxine (SYNTHROID) tablet 125 mcg  125 mcg Oral Daily Parks Ranger, DO   125 mcg at 01/25/22 0630   LORazepam (ATIVAN) tablet 0.5 mg  0.5 mg Oral Q4H PRN Parks Ranger, DO       magnesium hydroxide (MILK OF MAGNESIA) suspension 30 mL  30 mL Oral Daily PRN Parks Ranger, DO       modafinil (PROVIGIL) tablet 200 mg  200 mg Oral Daily Parks Ranger, DO   200 mg at 01/25/22 0913   OLANZapine (ZYPREXA) tablet 5 mg  5 mg Oral Q6H PRN Parks Ranger, DO       traZODone (DESYREL) tablet 100 mg  100 mg Oral QHS PRN Parks Ranger, DO   100 mg at 01/20/22 2118    Lab Results:  No results found for this or any previous visit (from the past 48 hour(s)).   Blood Alcohol level:  Lab Results  Component Value Date   ETH <10 54/00/8676    Metabolic Disorder Labs: Lab Results  Component Value Date   HGBA1C 5.6 01/10/2022   MPG 114.02 01/10/2022   MPG 117 08/20/2020   No results found for: PROLACTIN Lab Results  Component Value Date   CHOL 146 01/10/2022   TRIG 52 01/10/2022   HDL 62 01/10/2022   CHOLHDL 2.4 01/10/2022   VLDL 10 01/10/2022   LDLCALC 74 01/10/2022   LDLCALC 69 02/19/2020    Physical Findings: AIMS: Facial and Oral Movements Muscles of Facial Expression: None, normal Lips and Perioral Area: None, normal Jaw: None, normal Tongue: None, normal,Extremity Movements Upper (arms, wrists, hands, fingers): None, normal Lower (legs, knees, ankles, toes): None, normal, Trunk Movements Neck, shoulders, hips: None,  normal, Overall Severity Severity of abnormal movements (highest score from questions above): None, normal Incapacitation due to abnormal movements: None, normal Patient's awareness of abnormal movements (rate only patient's report): No Awareness, Dental Status Current problems with teeth and/or dentures?: No Does patient  usually wear dentures?: No  CIWA:    COWS:     Musculoskeletal: Strength & Muscle Tone: within normal limits Gait & Station: unsteady Patient leans: N/A  Psychiatric Specialty Exam:  Presentation  General Appearance: Appropriate for Environment; Casual; Neat  Eye Contact:Fair  Speech:Slow  Speech Volume:Decreased  Handedness:Right   Mood and Affect  Mood:Anxious  Affect:Constricted   Thought Process  Thought Processes:Irrevelant  Descriptions of Associations:Loose  Orientation:Partial  Thought Content:Illogical  History of Schizophrenia/Schizoaffective disorder:No data recorded Duration of Psychotic Symptoms:No data recorded Hallucinations:No data recorded Ideas of Reference:None  Suicidal Thoughts:No data recorded Homicidal Thoughts:No data recorded  Sensorium  Memory:Immediate Poor; Remote Poor  Judgment:Fair  Insight:Fair   Executive Functions  Concentration:Fair  Attention Span:Fair  [place]   Psychomotor Activity  Psychomotor Activity:No data recorded  Assets  Assets:Resilience   Sleep  Sleep:No data recorded   Physical Exam: Physical Exam Vitals and nursing note reviewed.  Constitutional:      Appearance: Normal appearance. Jay Webster is normal weight.  Neurological:     General: No focal deficit present.     Mental Status: Jay Webster is alert and oriented to person, place, and time.  Psychiatric:        Attention and Perception: Attention and perception normal.        Mood and Affect: Mood is depressed.        Speech: Speech normal.        Behavior: Behavior normal.  Behavior is cooperative.        Thought Content: Thought content normal.        Cognition and Memory: Cognition and memory normal.        Judgment: Judgment normal.   Review of Systems  Constitutional: Negative.   HENT: Negative.    Eyes: Negative.   Respiratory: Negative.    Cardiovascular: Negative.   Gastrointestinal: Negative.   Genitourinary: Negative.   Musculoskeletal: Negative.   Skin: Negative.   Neurological: Negative.   Endo/Heme/Allergies: Negative.   Psychiatric/Behavioral:  Positive for depression.   Blood pressure (!) 158/83, pulse 74, temperature 97.9 F (36.6 C), temperature source Oral, resp. rate 16, height '5\' 4"'$  (1.626 m), weight 84.3 kg, SpO2 97 %. Body mass index is 31.91 kg/m.   Treatment Plan Summary: Daily contact with patient to assess and evaluate symptoms and progress in treatment, Medication management, and Plan Continue current medications.  MDD, severe, recurrent -- continue Wellbutrin XL '150mg'$  daily  -- continue Modafinil '200mg'$  daily.   Cognitive impairment -- continue Aricept '5mg'$  daily.   Ethyl Vila, MD 01/25/2022, 12:01 PM

## 2022-01-25 NOTE — Progress Notes (Signed)
D: Pt alert and disoriented to time and situation. He denies experiencing any anxiety/depression at this time, however, appears with sad and depressed affect. Pt denies experiencing any pain at this time. Pt endorses passive SI, however, denies HI, or AVH. Assigned 1:1 staff posted within arms length of patient d/t high falls risk and intermittent confusion.  A: Scheduled medications administered to patient as prescribed. Support and encouragement provided. Frequent verbal contact made. Routine safety checks conducted q15 minutes.  R: No adverse drug reactions noted. Pt verbally contracts for safety at this time. Pt complaint with medications. Pt interacts minimally with others on the unit. Pt remains safe at this time. Will continue to monitor.

## 2022-01-26 DIAGNOSIS — R4689 Other symptoms and signs involving appearance and behavior: Secondary | ICD-10-CM | POA: Diagnosis not present

## 2022-01-26 DIAGNOSIS — R4189 Other symptoms and signs involving cognitive functions and awareness: Secondary | ICD-10-CM | POA: Diagnosis not present

## 2022-01-26 NOTE — Progress Notes (Signed)
Patient is calm and cooperative. Patient is able to tell this writer his name and that he is in a hospital in [place]. He is disoriented to time and situation. Patient says he slept well. Patient appetite is good, and he is observed to be eating breakfast in the dayroom. He denies SI, HI, and AVH. Patient remains on 1:1 for safety.

## 2022-01-26 NOTE — [facility] Group Notes (Signed)
10:30am Group: Chair Yoga  Pt attended group - limited in ROM did the best he could.

## 2022-01-26 NOTE — Progress Notes (Signed)
Patient was helped to the bathroom by staff. Patient is currently sitting in a recliner in the dayroom reading. Patient remains safe with 1:1 Air cabin crew.

## 2022-01-26 NOTE — Progress Notes (Signed)
[facility] MD Progress Note  01/26/2022 11:41 AM Jay Webster  MRN:  [id number] Subjective: Patient has been compliant with his medications.  He denies any side effects.  His mood is slightly better.  He is sleeping well and his appetite is good.  Social work is looking at [employer].  Jay Webster does not have any complaints.  Remains on one-to-one.  Principal Problem: Major depressive disorder, recurrent episode, severe ([place]) Diagnosis: Principal Problem:   Major depressive disorder, recurrent episode, severe ([facility]) Active Problems:   Cognitive and behavioral changes  Total Time spent with patient: 15 minutes  Past Psychiatric History: None  Past Medical History:  Past Medical History:  Diagnosis Date   Arthritis    OA AND PAIN LEFT HIP AND OTHER JOINT PAINS   Cancer ([place])    THYROID CANCER - HAD THYROID REMOVED   History of kidney stones    History of shingles    RESOLVED   Hypertension    Hypothyroidism    Low back pain    Macular degeneration of both eyes    RBBB (right bundle branch block with left anterior fascicular block)    Seasonal allergies    Thyroid disease    Uric acid renal calculus 06/20/2007   Qualifier: Diagnosis of  By: Rogue Bussing CMA, Maryann Alar      Past Surgical History:  Procedure Laterality Date   APPENDECTOMY  1948   CATARACT EXTRACTION Right 08/27/2019   COLONOSCOPY     Elbow Radical Reduction  1973   IR IMAGING GUIDED PORT INSERTION  05/05/2018   IR REMOVAL TUN ACCESS W/ PORT W/O FL MOD SED  10/18/2018   [place], 1976   MASS EXCISION N/A 03/11/2018   Procedure: EXCISION MASS OF PERINEUM;  Surgeon: Armandina Gemma, MD;  Location: [facility];  Service: General;  Laterality: N/A;   REMOVAL OF FIRST RIB   NOV 1994   FOR COMPRESSED VEIN BETWEEN 1 ST RIB AND COLLARBONE   Rib removed, 1st  1994   ROTATOR CUFF REPAIR  2009   THYROIDECTOMY  2010   TOTAL HIP ARTHROPLASTY Left 05/31/2013   Procedure: LEFT TOTAL HIP ARTHROPLASTY ANTERIOR APPROACH;   Surgeon: Gearlean Alf, MD;  Location: [facility];  Service: Orthopedics;  Laterality: Left;   Family History:  Family History  Problem Relation Age of Onset   Heart disease Mother        passed in 45s   Diabetes Mother    Alcohol abuse Father        passed from this   Parkinson's disease Sister    Diabetes Brother    Dementia Sister    Cancer Brother        sounds like bone cancer- non healing knee injury  Social History:  Social History   Substance and Sexual Activity  Alcohol Use Not Currently   Alcohol/week: 0.0 - 1.0 standard drinks     Social History   Substance and Sexual Activity  Drug Use No    Social History   Socioeconomic History   Marital status: Widowed    Spouse name: Not on file   Number of children: Not on file   Years of education: Not on file   Highest education level: Not on file  Occupational History   Occupation: retired  Tobacco Use   Smoking status: Former    Types: Cigarettes    Quit date: 02/22/1979    Years since quitting: 42.9   Smokeless tobacco: Never  Vaping Use   Vaping Use: Never used  Substance and Sexual Activity   Alcohol use: Not Currently    Alcohol/week: 0.0 - 1.0 standard drinks   Drug use: No   Sexual activity: Not Currently  Other Topics Concern   Not on file  Social History Narrative   Lives alone and 1 cat. Divorced from mongomous long term partner and eventual husband. No children from prior relationships.       Retired Geologist, engineering: socializing, used to play tennis   Social Determinants of Radio broadcast assistant Strain: Not on [employer] Insecurity: Not on file  Transportation Needs: Not on file  Physical Activity: Not on file  Stress: Not on file  Social Connections: Not on file   Additional Social History:                         Sleep: Good  Appetite:  Good  Current Medications: Current Facility-Administered Medications  Medication Dose Route Frequency  Provider Last Rate Last Admin   acetaminophen (TYLENOL) tablet 650 mg  650 mg Oral Q6H PRN Deloria Lair, NP   650 mg at 01/20/22 2242   alum & mag hydroxide-simeth (MAALOX/MYLANTA) 200-200-20 MG/5ML suspension 30 mL  30 mL Oral Q4H PRN Parks Ranger, DO       buPROPion (WELLBUTRIN XL) 24 hr tablet 150 mg  150 mg Oral Daily Parks Ranger, DO   150 mg at 01/26/22 0912   donepezil (ARICEPT) tablet 5 mg  5 mg Oral QHS Parks Ranger, DO   5 mg at 01/25/22 2117   levothyroxine (SYNTHROID) tablet 125 mcg  125 mcg Oral Daily Parks Ranger, DO   125 mcg at 01/26/22 0725   LORazepam (ATIVAN) tablet 0.5 mg  0.5 mg Oral Q4H PRN Parks Ranger, DO       magnesium hydroxide (MILK OF MAGNESIA) suspension 30 mL  30 mL Oral Daily PRN Parks Ranger, DO       modafinil (PROVIGIL) tablet 200 mg  200 mg Oral Daily Parks Ranger, DO   200 mg at 01/26/22 0912   OLANZapine (ZYPREXA) tablet 5 mg  5 mg Oral Q6H PRN Parks Ranger, DO       traZODone (DESYREL) tablet 100 mg  100 mg Oral QHS PRN Parks Ranger, DO   100 mg at 01/25/22 2117    Lab Results: No results found for this or any previous visit (from the past 48 hour(s)).  Blood Alcohol level:  Lab Results  Component Value Date   ETH <10 64/40/3474    Metabolic Disorder Labs: Lab Results  Component Value Date   HGBA1C 5.6 01/10/2022   MPG 114.02 01/10/2022   MPG 117 08/20/2020   No results found for: PROLACTIN Lab Results  Component Value Date   CHOL 146 01/10/2022   TRIG 52 01/10/2022   HDL 62 01/10/2022   CHOLHDL 2.4 01/10/2022   VLDL 10 01/10/2022   LDLCALC 74 01/10/2022   LDLCALC 69 02/19/2020    Physical Findings: AIMS: Facial and Oral Movements Muscles of Facial Expression: None, normal Lips and Perioral Area: None, normal Jaw: None, normal Tongue: None, normal,Extremity Movements Upper (arms, wrists, hands, fingers): None, normal Lower (legs,  knees, ankles, toes): None, normal, Trunk Movements Neck, shoulders, hips: None, normal, Overall Severity Severity of abnormal movements (highest score from questions above): None, normal Incapacitation due to  abnormal movements: None, normal Patient's awareness of abnormal movements (rate only patient's report): No Awareness, Dental Status Current problems with teeth and/or dentures?: No Does patient usually wear dentures?: No  CIWA:    COWS:     Musculoskeletal: Strength & Muscle Tone: within normal limits Gait & Station: unable to stand Patient leans: N/A  Psychiatric Specialty Exam:  Presentation  General Appearance: Appropriate for Environment; Casual; Neat  Eye Contact:Fair  Speech:Slow  Speech Volume:Decreased  Handedness:Right   Mood and Affect  Mood:Anxious  Affect:Constricted   Thought Process  Thought Processes:Irrevelant  Descriptions of Associations:Loose  Orientation:Partial  Thought Content:Illogical  History of Schizophrenia/Schizoaffective disorder:No data recorded Duration of Psychotic Symptoms:No data recorded Hallucinations:No data recorded Ideas of Reference:None  Suicidal Thoughts:No data recorded Homicidal Thoughts:No data recorded  Sensorium  Memory:Immediate Poor; Remote Poor  Judgment:Fair  Insight:Fair   Executive Functions  Concentration:Fair  Attention Span:Fair  [place]   Psychomotor Activity  Psychomotor Activity:No data recorded  Assets  Assets:Resilience   Sleep  Sleep:No data recorded   Physical Exam: Physical Exam Vitals and nursing note reviewed.  Constitutional:      Appearance: Normal appearance. He is normal weight.  Neurological:     General: No focal deficit present.     Mental Status: He is alert and oriented to person, place, and time.  Psychiatric:        Attention and Perception: Attention and perception normal.        Mood and Affect:  Mood is depressed.        Speech: Speech normal.        Behavior: Behavior normal. Behavior is cooperative.        Thought Content: Thought content normal.        Cognition and Memory: Cognition normal. Memory is impaired.        Judgment: Judgment normal.   Review of Systems  Constitutional: Negative.   HENT: Negative.    Eyes: Negative.   Respiratory: Negative.    Cardiovascular: Negative.   Gastrointestinal: Negative.   Genitourinary: Negative.   Musculoskeletal: Negative.   Skin: Negative.   Neurological: Negative.   Endo/Heme/Allergies: Negative.   Psychiatric/Behavioral:  Positive for depression.   Blood pressure 134/82, pulse 66, temperature 97.6 F (36.4 C), temperature source Oral, resp. rate 18, height '5\' 4"'$  (1.626 m), weight 84.3 kg, SpO2 99 %. Body mass index is 31.91 kg/m.   Treatment Plan Summary: Daily contact with patient to assess and evaluate symptoms and progress in treatment, Medication management, and Plan continue current medications.  Parks Ranger, DO 01/26/2022, 11:41 AM

## 2022-01-26 NOTE — Progress Notes (Signed)
1:1 Note  Patient continued to be monitored 1:1 for safety and falls. All fall protocol in place. Patient in bed sleeping respirations noted no S/S of distress. Support and encouragement provided.

## 2022-01-26 NOTE — Progress Notes (Signed)
Pt alert, in good spirits

## 2022-01-26 NOTE — Progress Notes (Signed)
Patient is observed to be eating lunch in the dayroom. Patient remains on 1:1 for safety.

## 2022-01-26 NOTE — Progress Notes (Signed)
Patient in room awake being assisted ADL. Patient is incontinent of bowel and bladder. Patient ambulating in room with front wheel walker and 1:1 observation ongoing. Patient remains safe.   Support and encouragement provided.

## 2022-01-26 NOTE — BHH Counselor (Signed)
CSW discussed discharge planning with pt. CSW informed pt that a long term care facility was being recommended for pt. Pt said that was ok and was open to referral. CSW informed him that [facility] was considering him for placement and would reach out after referral had been sent to set up a time to meet pt.   CSW also requested that pt release power of attorney documentation to [employer], [phone], and pt stated that was acceptable. CSW will follow up with pt's attorney's office regarding information, Winona Legato, [phone] .   No other requests made. CSW will continue to follow up on placement options for pt and pending referral to [facility] in [place].   Danyell Shader [place], MSW, LCSW-A 5/22/20234:11 PM

## 2022-01-26 NOTE — BHH Group Notes (Signed)
15:00 Group: outdoor activity  Activities: Q ball, Music selection  Pt refused to participate  Prefer sit and read in day room w/ new reading glasses (provided by [facility]).

## 2022-01-26 NOTE — Progress Notes (Signed)
1:1 Note  Patient continued to be observed on 1:1. Compliant with medication and no adverse effects noted. Patient using his front wheel walker for ambulation.   Support and encouragement provided.

## 2022-01-27 DIAGNOSIS — R4189 Other symptoms and signs involving cognitive functions and awareness: Secondary | ICD-10-CM | POA: Diagnosis not present

## 2022-01-27 DIAGNOSIS — R4689 Other symptoms and signs involving appearance and behavior: Secondary | ICD-10-CM | POA: Diagnosis not present

## 2022-01-27 MED ORDER — TUBERCULIN PPD 5 UNIT/0.1ML ID SOLN
5.0000 [IU] | Freq: Once | INTRADERMAL | Status: AC
  Administered 2022-01-27: 5 [IU] via INTRADERMAL
  Filled 2022-01-27: qty 0.1

## 2022-01-27 NOTE — Progress Notes (Signed)
Patient is alert and oriented to self. He tells this Probation officer he is in [employer] and that it is the year 2012. Patient denies SI, HI, and AVH. Patient begins to ruminate on how the people he used to know are all deceased. Patient feels hopeless. Patient is currently talking and reading magazine with 1:1 sitter. He remains safe on the unit at this time.

## 2022-01-27 NOTE — Progress Notes (Signed)
[facility] MD Progress Note  01/27/2022 11:05 AM Jay Webster  MRN:  [id number] Subjective: Jay Webster is seen on rounds.  Seems to be in a better mood and more alert at times since I changed his medications.  Social work continues to look for placement.  [name] does not have any complaints.  And he gives me a thumbs up.  Principal Problem: Major depressive disorder, recurrent episode, severe ([place]) Diagnosis: Principal Problem:   Major depressive disorder, recurrent episode, severe ([facility]) Active Problems:   Cognitive and behavioral changes  Total Time spent with patient: 15 minutes  Past Psychiatric History: None  Past Medical History:  Past Medical History:  Diagnosis Date   Arthritis    OA AND PAIN LEFT HIP AND OTHER JOINT PAINS   Cancer ([place])    THYROID CANCER - HAD THYROID REMOVED   History of kidney stones    History of shingles    RESOLVED   Hypertension    Hypothyroidism    Low back pain    Macular degeneration of both eyes    RBBB (right bundle branch block with left anterior fascicular block)    Seasonal allergies    Thyroid disease    Uric acid renal calculus 06/20/2007   Qualifier: Diagnosis of  By: Rogue Bussing CMA, Maryann Alar      Past Surgical History:  Procedure Laterality Date   APPENDECTOMY  1948   CATARACT EXTRACTION Right 08/27/2019   COLONOSCOPY     Elbow Radical Reduction  1973   IR IMAGING GUIDED PORT INSERTION  05/05/2018   IR REMOVAL TUN ACCESS W/ PORT W/O FL MOD SED  10/18/2018   [place], 1976   MASS EXCISION N/A 03/11/2018   Procedure: EXCISION MASS OF PERINEUM;  Surgeon: Armandina Gemma, MD;  Location: [facility];  Service: General;  Laterality: N/A;   REMOVAL OF FIRST RIB   NOV 1994   FOR COMPRESSED VEIN BETWEEN 1 ST RIB AND COLLARBONE   Rib removed, 1st  1994   ROTATOR CUFF REPAIR  2009   THYROIDECTOMY  2010   TOTAL HIP ARTHROPLASTY Left 05/31/2013   Procedure: LEFT TOTAL HIP ARTHROPLASTY ANTERIOR APPROACH;  Surgeon: Gearlean Alf, MD;   Location: [facility];  Service: Orthopedics;  Laterality: Left;   Family History:  Family History  Problem Relation Age of Onset   Heart disease Mother        passed in 33s   Diabetes Mother    Alcohol abuse Father        passed from this   Parkinson's disease Sister    Diabetes Brother    Dementia Sister    Cancer Brother        sounds like bone cancer- non healing knee injury    Social History:  Social History   Substance and Sexual Activity  Alcohol Use Not Currently   Alcohol/week: 0.0 - 1.0 standard drinks     Social History   Substance and Sexual Activity  Drug Use No    Social History   Socioeconomic History   Marital status: Widowed    Spouse name: Not on file   Number of children: Not on file   Years of education: Not on file   Highest education level: Not on file  Occupational History   Occupation: retired  Tobacco Use   Smoking status: Former    Types: Cigarettes    Quit date: 02/22/1979    Years since quitting: 42.9   Smokeless tobacco: Never  Vaping Use   Vaping Use: Never used  Substance and Sexual Activity   Alcohol use: Not Currently    Alcohol/week: 0.0 - 1.0 standard drinks   Drug use: No   Sexual activity: Not Currently  Other Topics Concern   Not on file  Social History Narrative   Lives alone and 1 cat. Divorced from mongomous long term partner and eventual husband. No children from prior relationships.       Retired Geologist, engineering: socializing, used to play tennis   Social Determinants of Radio broadcast assistant Strain: Not on [employer] Insecurity: Not on file  Transportation Needs: Not on file  Physical Activity: Not on file  Stress: Not on file  Social Connections: Not on file   Additional Social History:                         Sleep: Good  Appetite:  Good  Current Medications: Current Facility-Administered Medications  Medication Dose Route Frequency Provider Last Rate Last Admin    acetaminophen (TYLENOL) tablet 650 mg  650 mg Oral Q6H PRN Deloria Lair, NP   650 mg at 01/20/22 2242   alum & mag hydroxide-simeth (MAALOX/MYLANTA) 200-200-20 MG/5ML suspension 30 mL  30 mL Oral Q4H PRN Parks Ranger, DO       buPROPion (WELLBUTRIN XL) 24 hr tablet 150 mg  150 mg Oral Daily Parks Ranger, DO   150 mg at 01/27/22 0901   donepezil (ARICEPT) tablet 5 mg  5 mg Oral QHS Parks Ranger, DO   5 mg at 01/26/22 2117   levothyroxine (SYNTHROID) tablet 125 mcg  125 mcg Oral Daily Parks Ranger, DO   125 mcg at 01/27/22 0865   LORazepam (ATIVAN) tablet 0.5 mg  0.5 mg Oral Q4H PRN Parks Ranger, DO   0.5 mg at 01/26/22 2117   magnesium hydroxide (MILK OF MAGNESIA) suspension 30 mL  30 mL Oral Daily PRN Parks Ranger, DO       modafinil (PROVIGIL) tablet 200 mg  200 mg Oral Daily Parks Ranger, DO   200 mg at 01/27/22 0902   OLANZapine (ZYPREXA) tablet 5 mg  5 mg Oral Q6H PRN Parks Ranger, DO       traZODone (DESYREL) tablet 100 mg  100 mg Oral QHS PRN Parks Ranger, DO   100 mg at 01/26/22 2117    Lab Results: No results found for this or any previous visit (from the past 48 hour(s)).  Blood Alcohol level:  Lab Results  Component Value Date   ETH <10 78/46/9629    Metabolic Disorder Labs: Lab Results  Component Value Date   HGBA1C 5.6 01/10/2022   MPG 114.02 01/10/2022   MPG 117 08/20/2020   No results found for: PROLACTIN Lab Results  Component Value Date   CHOL 146 01/10/2022   TRIG 52 01/10/2022   HDL 62 01/10/2022   CHOLHDL 2.4 01/10/2022   VLDL 10 01/10/2022   LDLCALC 74 01/10/2022   LDLCALC 69 02/19/2020    Physical Findings: AIMS: Facial and Oral Movements Muscles of Facial Expression: None, normal Lips and Perioral Area: None, normal Jaw: None, normal Tongue: None, normal,Extremity Movements Upper (arms, wrists, hands, fingers): None, normal Lower (legs, knees,  ankles, toes): None, normal, Trunk Movements Neck, shoulders, hips: None, normal, Overall Severity Severity of abnormal movements (highest score from questions above): None, normal  Incapacitation due to abnormal movements: None, normal Patient's awareness of abnormal movements (rate only patient's report): No Awareness, Dental Status Current problems with teeth and/or dentures?: No Does patient usually wear dentures?: No  CIWA:    COWS:     Musculoskeletal: Strength & Muscle Tone: within normal limits Gait & Station: normal Patient leans: N/A  Psychiatric Specialty Exam:  Presentation  General Appearance: Appropriate for Environment; Casual; Neat  Eye Contact:Fair  Speech:Slow  Speech Volume:Decreased  Handedness:Right   Mood and Affect  Mood:Anxious  Affect:Constricted   Thought Process  Thought Processes:Irrevelant  Descriptions of Associations:Loose  Orientation:Partial  Thought Content:Illogical  History of Schizophrenia/Schizoaffective disorder:No data recorded Duration of Psychotic Symptoms:No data recorded Hallucinations:No data recorded Ideas of Reference:None  Suicidal Thoughts:No data recorded Homicidal Thoughts:No data recorded  Sensorium  Memory:Immediate Poor; Remote Poor  Judgment:Fair  Insight:Fair   Executive Functions  Concentration:Fair  Attention Span:Fair  [place]   Psychomotor Activity  Psychomotor Activity:No data recorded  Assets  Assets:Resilience   Sleep  Sleep:No data recorded   Physical Exam: Physical Exam Vitals and nursing note reviewed.  Constitutional:      Appearance: Normal appearance. He is normal weight.  Neurological:     General: No focal deficit present.     Mental Status: He is alert and oriented to person, place, and time.  Psychiatric:        Attention and Perception: Attention and perception normal.        Mood and Affect: Mood and affect  normal.        Speech: Speech normal.        Behavior: Behavior normal. Behavior is cooperative.        Thought Content: Thought content normal.        Cognition and Memory: Memory normal. Cognition is impaired.        Judgment: Judgment normal.   Review of Systems  Constitutional: Negative.   HENT: Negative.    Eyes: Negative.   Respiratory: Negative.    Cardiovascular: Negative.   Gastrointestinal: Negative.   Genitourinary: Negative.   Musculoskeletal: Negative.   Skin: Negative.   Neurological: Negative.   Endo/Heme/Allergies: Negative.   Psychiatric/Behavioral:  Positive for depression and memory loss.   Blood pressure 137/71, pulse 72, temperature 97.6 F (36.4 C), temperature source Oral, resp. rate 20, height '5\' 4"'$  (1.626 m), weight 84.3 kg, SpO2 99 %. Body mass index is 31.91 kg/m.   Treatment Plan Summary: Daily contact with patient to assess and evaluate symptoms and progress in treatment, Medication management, and Plan continue current medications.  [place], DO 01/27/2022, 11:05 AM

## 2022-01-27 NOTE — BHH Counselor (Signed)
CSW met with Sloan Leiter,  [email or web address], [phone] from [facility] and pt via [place]. She stated that she is receiving the power of attorney documentation this afternoon and  pt can be discharged to facility this week because he has been accepted at the facility.   CSW stated that pt's provider would aim for Friday and CSW would arrange for non-emergent medical transportation for pt.   CSW will send FL2 and TB skin test documentation to facility at admission.   No other requests were made. Conversation ended without incident.   Daymion Nazaire [place], MSW, LCSW-A 5/23/202311:34 AM

## 2022-01-27 NOTE — Progress Notes (Signed)
Pt alert, happy, rested _ pt has no new complaints. Got up for breakfast on time.

## 2022-01-27 NOTE — Progress Notes (Signed)
This writer assumed care of this patient at 1300. Patient is calm, sitting in the dayroom sipping on a drink.

## 2022-01-27 NOTE — Progress Notes (Signed)
Recreation Therapy Notes  Date: 01/27/2022   Time: 2:30pm     Location: Court yard     Behavioral response: N/A   Intervention Topic: Strengths     Discussion/Intervention: Patient refused to attend group.    Clinical Observations/Feedback:  Patient refused to attend group.    Barrett Holthaus LRT/CTRS        Kemiah Booz 01/27/2022 2:34 PM

## 2022-01-27 NOTE — Progress Notes (Signed)
PPD given to left forearm. Lot # [id number] Exp. Date 02/26/22. No complain or discomfort voiced at this time.

## 2022-01-27 NOTE — Progress Notes (Signed)
Patient is currently in the day room among peers, calm and cooperative at this time. Will continue to monitor.

## 2022-01-28 DIAGNOSIS — R4689 Other symptoms and signs involving appearance and behavior: Secondary | ICD-10-CM | POA: Diagnosis not present

## 2022-01-28 DIAGNOSIS — R4189 Other symptoms and signs involving cognitive functions and awareness: Secondary | ICD-10-CM | POA: Diagnosis not present

## 2022-01-28 NOTE — Progress Notes (Signed)
Patient is sitting in the dayroom eating dinner with 1:1 sitter at his side. Patient remains safe on the unit at this time.

## 2022-01-28 NOTE — Progress Notes (Signed)
Patient is observed to be eating lunch in the dayroom. Patient is calm and remains safe on the unit with 1:1 safety sitter.

## 2022-01-28 NOTE — Progress Notes (Signed)
Recreation Therapy Notes  Date: 05/242023   Time: 1:30pm     Location: Craft room      Behavioral response: N/A   Intervention Topic:  Decision Making    Discussion/Intervention: Group unable to be held at this time due to another group taking place.    Clinical Observations/Feedback:  Group unable to be held at this time due to another group taking place.   Jay Webster LRT/CTRS          Jay Webster 01/28/2022 2:24 PM

## 2022-01-28 NOTE — Progress Notes (Signed)
Pt sitting in dayroom with sitter by his side; calm, cooperative. Pt states "I'm fine." He denies pain and SI/HI/AVH at this time. He states that he slept "well" last night and describes his appetite as "too good". He currently denies anxiety; however, he expresses feelings of depression due to "My niece is in [place], [place] and I'm here in [place]." No acute distress note.

## 2022-01-28 NOTE — Progress Notes (Addendum)
Pt lying in bed with eyes closed and sitter at bedside; easily aroused when name called. Pt states that he feels "like I feel on a drunk" and confirms that he feels like he has been drinking alcohol. He currently denies SI/HI and visual hallucinations. He endorses auditory hallucinations, which say "Go to hell, idiot." He states "They make things difficult for me." He denies that the voices tell him to hurt himself or others. Then pt states "That's a lie"; however, he is unable to articulate what he lied about. He reports "Sometimes I step out of bounds and get angry." He reports "no problem" with sleep or appetite. He expresses feelings of anxiety, which he rates 2/10 because "things constantly change" and depression, which he rates 2/10 because "I forget things." No acute distress noted.

## 2022-01-28 NOTE — Progress Notes (Signed)
Patient denies SI, HI, and AVH. He endorses some anxiety, and says it is because of "facing the world". Patient reports good sleep and appetite. Patient is compliant with scheduled medications. Patient remains safe on the unit at this time. Patient remains on 1:1 for safety.

## 2022-01-28 NOTE — Progress Notes (Signed)
Pt lying in bed with sitter at bedside; calm, cooperative. Pt states that he feels "pretty good". He currently denies pain and SI/HI/AVH. He states that he slept "like a log" last night and describes his appetite as "too good". He denies depression at this time; however, he expresses feelings of anxiety and states "I worry about a lot of things." No acute distress noted.

## 2022-01-28 NOTE — Progress Notes (Signed)
[facility] MD Progress Note  01/28/2022 12:50 PM Jay Webster  MRN:  [id number] Subjective: Jay Webster is seen on rounds.  He is doing very well.  He seems to be doing well on the medications.  He is more alert.  He is sleeping well.  His appetite is good.  He does not have any complaints and denies any side effects from his medications.  Principal Problem: Major depressive disorder, recurrent episode, severe ([place]) Diagnosis: Principal Problem:   Major depressive disorder, recurrent episode, severe ([facility]) Active Problems:   Cognitive and behavioral changes  Total Time spent with patient: 15 minutes  Past Psychiatric History: None  Past Medical History:  Past Medical History:  Diagnosis Date   Arthritis    OA AND PAIN LEFT HIP AND OTHER JOINT PAINS   Cancer ([place])    THYROID CANCER - HAD THYROID REMOVED   History of kidney stones    History of shingles    RESOLVED   Hypertension    Hypothyroidism    Low back pain    Macular degeneration of both eyes    RBBB (right bundle branch block with left anterior fascicular block)    Seasonal allergies    Thyroid disease    Uric acid renal calculus 06/20/2007   Qualifier: Diagnosis of  By: Rogue Bussing CMA, Maryann Alar      Past Surgical History:  Procedure Laterality Date   APPENDECTOMY  1948   CATARACT EXTRACTION Right 08/27/2019   COLONOSCOPY     Elbow Radical Reduction  1973   IR IMAGING GUIDED PORT INSERTION  05/05/2018   IR REMOVAL TUN ACCESS W/ PORT W/O FL MOD SED  10/18/2018   [place], 1976   MASS EXCISION N/A 03/11/2018   Procedure: EXCISION MASS OF PERINEUM;  Surgeon: Armandina Gemma, MD;  Location: [facility];  Service: General;  Laterality: N/A;   REMOVAL OF FIRST RIB   NOV 1994   FOR COMPRESSED VEIN BETWEEN 1 ST RIB AND COLLARBONE   Rib removed, 1st  1994   ROTATOR CUFF REPAIR  2009   THYROIDECTOMY  2010   TOTAL HIP ARTHROPLASTY Left 05/31/2013   Procedure: LEFT TOTAL HIP ARTHROPLASTY ANTERIOR APPROACH;  Surgeon: Gearlean Alf, MD;  Location: [facility];  Service: Orthopedics;  Laterality: Left;   Family History:  Family History  Problem Relation Age of Onset   Heart disease Mother        passed in 56s   Diabetes Mother    Alcohol abuse Father        passed from this   Parkinson's disease Sister    Diabetes Brother    Dementia Sister    Cancer Brother        sounds like bone cancer- non healing knee injury    Social History:  Social History   Substance and Sexual Activity  Alcohol Use Not Currently   Alcohol/week: 0.0 - 1.0 standard drinks     Social History   Substance and Sexual Activity  Drug Use No    Social History   Socioeconomic History   Marital status: Widowed    Spouse name: Not on file   Number of children: Not on file   Years of education: Not on file   Highest education level: Not on file  Occupational History   Occupation: retired  Tobacco Use   Smoking status: Former    Types: Cigarettes    Quit date: 02/22/1979    Years since quitting: 71.9  Smokeless tobacco: Never  Vaping Use   Vaping Use: Never used  Substance and Sexual Activity   Alcohol use: Not Currently    Alcohol/week: 0.0 - 1.0 standard drinks   Drug use: No   Sexual activity: Not Currently  Other Topics Concern   Not on file  Social History Narrative   Lives alone and 1 cat. Divorced from mongomous long term partner and eventual husband. No children from prior relationships.       Retired Geologist, engineering: socializing, used to play tennis   Social Determinants of Radio broadcast assistant Strain: Not on [employer] Insecurity: Not on file  Transportation Needs: Not on file  Physical Activity: Not on file  Stress: Not on file  Social Connections: Not on file   Additional Social History:                         Sleep: Good  Appetite:  Good  Current Medications: Current Facility-Administered Medications  Medication Dose Route Frequency Provider Last Rate  Last Admin   acetaminophen (TYLENOL) tablet 650 mg  650 mg Oral Q6H PRN Deloria Lair, NP   650 mg at 01/20/22 2242   alum & mag hydroxide-simeth (MAALOX/MYLANTA) 200-200-20 MG/5ML suspension 30 mL  30 mL Oral Q4H PRN Parks Ranger, DO       buPROPion (WELLBUTRIN XL) 24 hr tablet 150 mg  150 mg Oral Daily Parks Ranger, DO   150 mg at 01/28/22 1275   donepezil (ARICEPT) tablet 5 mg  5 mg Oral QHS Parks Ranger, DO   5 mg at 01/27/22 2141   levothyroxine (SYNTHROID) tablet 125 mcg  125 mcg Oral Daily Parks Ranger, DO   125 mcg at 01/28/22 1700   LORazepam (ATIVAN) tablet 0.5 mg  0.5 mg Oral Q4H PRN Parks Ranger, DO   0.5 mg at 01/26/22 2117   magnesium hydroxide (MILK OF MAGNESIA) suspension 30 mL  30 mL Oral Daily PRN Parks Ranger, DO       modafinil (PROVIGIL) tablet 200 mg  200 mg Oral Daily Parks Ranger, DO   200 mg at 01/28/22 1749   OLANZapine (ZYPREXA) tablet 5 mg  5 mg Oral Q6H PRN Parks Ranger, DO       traZODone (DESYREL) tablet 100 mg  100 mg Oral QHS PRN Parks Ranger, DO   100 mg at 01/27/22 2140   tuberculin injection 5 Units  5 Units Intradermal Once Parks Ranger, DO   5 Units at 01/27/22 1310    Lab Results: No results found for this or any previous visit (from the past 43 hour(s)).  Blood Alcohol level:  Lab Results  Component Value Date   ETH <10 44/96/7591    Metabolic Disorder Labs: Lab Results  Component Value Date   HGBA1C 5.6 01/10/2022   MPG 114.02 01/10/2022   MPG 117 08/20/2020   No results found for: PROLACTIN Lab Results  Component Value Date   CHOL 146 01/10/2022   TRIG 52 01/10/2022   HDL 62 01/10/2022   CHOLHDL 2.4 01/10/2022   VLDL 10 01/10/2022   LDLCALC 74 01/10/2022   LDLCALC 69 02/19/2020    Physical Findings: AIMS: Facial and Oral Movements Muscles of Facial Expression: None, normal Lips and Perioral Area: None, normal Jaw: None,  normal Tongue: None, normal,Extremity Movements Upper (arms, wrists, hands, fingers): None, normal Lower (  legs, knees, ankles, toes): None, normal, Trunk Movements Neck, shoulders, hips: None, normal, Overall Severity Severity of abnormal movements (highest score from questions above): None, normal Incapacitation due to abnormal movements: None, normal Patient's awareness of abnormal movements (rate only patient's report): No Awareness, Dental Status Current problems with teeth and/or dentures?: No Does patient usually wear dentures?: No  CIWA:    COWS:     Musculoskeletal: Strength & Muscle Tone: within normal limits Gait & Station: unsteady Patient leans: N/A  Psychiatric Specialty Exam:  Presentation  General Appearance: Appropriate for Environment; Casual; Neat  Eye Contact:Fair  Speech:Slow  Speech Volume:Decreased  Handedness:Right   Mood and Affect  Mood:Anxious  Affect:Constricted   Thought Process  Thought Processes:Irrevelant  Descriptions of Associations:Loose  Orientation:Partial  Thought Content:Illogical  History of Schizophrenia/Schizoaffective disorder:No data recorded Duration of Psychotic Symptoms:No data recorded Hallucinations:No data recorded Ideas of Reference:None  Suicidal Thoughts:No data recorded Homicidal Thoughts:No data recorded  Sensorium  Memory:Immediate Poor; Remote Poor  Judgment:Fair  Insight:Fair   Executive Functions  Concentration:Fair  Attention Span:Fair  [place]   Psychomotor Activity  Psychomotor Activity:No data recorded  Assets  Assets:Resilience   Sleep  Sleep:No data recorded   Physical Exam: Physical Exam Vitals and nursing note reviewed.  Constitutional:      Appearance: Normal appearance. He is normal weight.  Neurological:     General: No focal deficit present.     Mental Status: He is alert and oriented to person, place, and time.   Psychiatric:        Attention and Perception: Attention and perception normal.        Mood and Affect: Affect normal. Mood is depressed.        Speech: Speech normal.        Behavior: Behavior normal. Behavior is cooperative.        Thought Content: Thought content normal.        Cognition and Memory: Cognition and memory normal.        Judgment: Judgment normal.   Review of Systems  Constitutional: Negative.   HENT: Negative.    Eyes: Negative.   Respiratory: Negative.    Cardiovascular: Negative.   Gastrointestinal: Negative.   Genitourinary: Negative.   Musculoskeletal: Negative.   Skin: Negative.   Neurological: Negative.   Endo/Heme/Allergies: Negative.   Psychiatric/Behavioral:  Positive for depression.   Blood pressure 124/61, pulse 75, temperature 97.8 F (36.6 C), temperature source Oral, resp. rate 18, height '5\' 4"'$  (1.626 m), weight 84.3 kg, SpO2 97 %. Body mass index is 31.91 kg/m.   Treatment Plan Summary: Daily contact with patient to assess and evaluate symptoms and progress in treatment, Medication management, and Plan continue current medications.  Parks Ranger, DO 01/28/2022, 12:50 PM

## 2022-01-29 DIAGNOSIS — R4689 Other symptoms and signs involving appearance and behavior: Secondary | ICD-10-CM | POA: Diagnosis not present

## 2022-01-29 DIAGNOSIS — R4189 Other symptoms and signs involving cognitive functions and awareness: Secondary | ICD-10-CM | POA: Diagnosis not present

## 2022-01-29 LAB — RESP PANEL BY RT-PCR (FLU A&B, COVID) ARPGX2
Influenza A by PCR: NEGATIVE
Influenza B by PCR: NEGATIVE
SARS Coronavirus 2 by RT PCR: NEGATIVE

## 2022-01-29 MED ORDER — TRAZODONE HCL 100 MG PO TABS
100.0000 mg | ORAL_TABLET | Freq: Every evening | ORAL | 3 refills | Status: DC | PRN
Start: 2022-01-29 — End: 2023-01-08

## 2022-01-29 MED ORDER — MODAFINIL 200 MG PO TABS: 200.0000 mg | ORAL_TABLET | Freq: Every day | ORAL | 3 refills | Status: DC

## 2022-01-29 MED ORDER — BUPROPION HCL ER (XL) 150 MG PO TB24: 150.0000 mg | ORAL_TABLET | Freq: Every day | ORAL | 3 refills | Status: DC

## 2022-01-29 MED ORDER — DONEPEZIL HCL 5 MG PO TABS: 5.0000 mg | ORAL_TABLET | Freq: Every day | ORAL | 3 refills | Status: DC

## 2022-01-29 MED ORDER — LORAZEPAM 0.5 MG PO TABS
0.5000 mg | ORAL_TABLET | ORAL | 0 refills | Status: DC | PRN
Start: 2022-01-29 — End: 2023-01-07

## 2022-01-29 NOTE — Progress Notes (Signed)
   01/29/22 1408  PPD Results  Does patient have an induration at the injection site? No  Induration(mm) 0 mm  Name of Physician Notified Dr. Louis Meckel

## 2022-01-29 NOTE — Discharge Summary (Signed)
Physician Discharge Summary Note  Patient:  Jay Webster is an 88 y.o., male MRN:  [id number] DOB:  1932/06/08 Patient phone:  [phone] (home)  Patient address:   [address] [place] [address],  Total Time spent with patient: 1 hour  Date of Admission:  01/07/2022 Date of Discharge: 01/30/2022  Reason for Admission:  Steven is a 88 year old white male who lives alone and apparently called EMS and told the ER that he was having suicidal thoughts.  He is very pleasant and cooperative and tells me that he has never seen a psychiatrist, denies any past suicide attempts or suicidal ideation.  No history of psychosis.  He has never been married and does not have any kids.  His closest support is a niece in [place].  His biggest complaint is memory loss and loneliness and some depression.   PER INITIAL INTAKE: Nyeem Stoke Webster is a 88 y.o. male. Presenting to ER under IVC.  Per report, patient was hitting himself and crying uncontrollably and made statement that he wanted to end it all.  He currently states that he does not have any ongoing thoughts of suicide.  States he did not intend to commit any acts of self-harm or suicide.  Has no thoughts of hurting others.  No homicidal ideation.  Reports that he has a slight headache.  Does not want any medicine for this.  Takes Synthroid but denies any other chronic medications.  Principal Problem: Major depressive disorder, recurrent episode, severe ([place]) Discharge Diagnoses: Principal Problem:   Major depressive disorder, recurrent episode, severe ([place]) Active Problems:   Cognitive and behavioral changes   Past Psychiatric History: None  Past Medical History:  Past Medical History:  Diagnosis Date   Arthritis    OA AND PAIN LEFT HIP AND OTHER JOINT PAINS   Cancer ([place])    THYROID CANCER - HAD THYROID REMOVED   History of kidney stones    History of shingles    RESOLVED   Hypertension    Hypothyroidism    Low back pain     Macular degeneration of both eyes    RBBB (right bundle branch block with left anterior fascicular block)    Seasonal allergies    Thyroid disease    Uric acid renal calculus 06/20/2007   Qualifier: Diagnosis of  By: Rogue Bussing CMA, Maryann Alar      Past Surgical History:  Procedure Laterality Date   APPENDECTOMY  1948   CATARACT EXTRACTION Right 08/27/2019   COLONOSCOPY     Elbow Radical Reduction  1973   IR IMAGING GUIDED PORT INSERTION  05/05/2018   IR REMOVAL TUN ACCESS W/ PORT W/O FL MOD SED  10/18/2018   [place], 1976   MASS EXCISION N/A 03/11/2018   Procedure: EXCISION MASS OF PERINEUM;  Surgeon: Armandina Gemma, MD;  Location: [facility];  Service: General;  Laterality: N/A;   REMOVAL OF FIRST RIB   [place] BETWEEN 1 ST RIB AND COLLARBONE   Rib removed, 1st  [place]  2009   THYROIDECTOMY  2010   TOTAL HIP ARTHROPLASTY Left 05/31/2013   Procedure: LEFT TOTAL HIP ARTHROPLASTY ANTERIOR APPROACH;  Surgeon: Gearlean Alf, MD;  Location: [facility];  Service: Orthopedics;  Laterality: Left;   Family History:  Family History  Problem Relation Age of Onset   Heart disease Mother        passed in 63s  Diabetes Mother    Alcohol abuse Father        passed from this   Parkinson's disease Sister    Diabetes Brother    Dementia Sister    Cancer Brother        sounds like bone cancer- non healing knee injury   Family Psychiatric  History: None Social History:  Social History   Substance and Sexual Activity  Alcohol Use Not Currently   Alcohol/week: 0.0 - 1.0 standard drinks     Social History   Substance and Sexual Activity  Drug Use No    Social History   Socioeconomic History   Marital status: Widowed    Spouse name: Not on file   Number of children: Not on file   Years of education: Not on file   Highest education level: Not on file  Occupational History   Occupation: retired  Tobacco Use   Smoking status:  Former    Types: Cigarettes    Quit date: 02/22/1979    Years since quitting: 42.9   Smokeless tobacco: Never  Vaping Use   Vaping Use: Never used  Substance and Sexual Activity   Alcohol use: Not Currently    Alcohol/week: 0.0 - 1.0 standard drinks   Drug use: No   Sexual activity: Not Currently  Other Topics Concern   Not on file  Social History Narrative   Lives alone and 1 cat. Divorced from mongomous long term partner and eventual husband. No children from prior relationships.       Retired Geologist, engineering: socializing, used to play tennis   Social Determinants of Radio broadcast assistant Strain: Not on [employer] Insecurity: Not on file  Transportation Needs: Not on file  Physical Activity: Not on file  Stress: Not on file  Social Connections: Not on file    Hospital Course: Stellan was admitted involuntarily to geriatric psychiatry for worsening depression and making suicidal statements.  He has been living on his own and is unable to care for his activities of daily living.  He did sign voluntarily after a few days.  Initially, he was started on a low-dose of Haldol and Remeron at bedtime.  He was pretty unsteady so OT and PT were consulted and they recommended: Long-term institutional care without follow-up therapy.  He was pleasant and cooperative on the unit.  It felt that he had some mild cognitive impairment and he was started on Aricept.  Due to his daytime somnolence his Haldol and Remeron were discontinued and he was started on Wellbutrin and modafinil.  He did much better on these medications and became more alert and cooperative.  He is hard of hearing so it was difficult to have conversations with him but he was very appropriate when you did.  His depression was directly related to losing his wife and general physical decline along with no other social supports.  His mood did improve on the modafinil and Wellbutrin.  He also took trazodone on  occasion for sleep and Ativan for anxiety.  He did well with all of his medications.  He denied any side effects and there was no evidence of any side effects.  His sleep and appetite remained good throughout his stay.  He denied any active suicidal plan.  He denied any auditory or visual hallucinations.  It was felt that he maximize hospitalization and he was discharged to [facility] assisted living.  Physical Findings: AIMS: Facial and  Oral Movements Muscles of Facial Expression: None, normal Lips and Perioral Area: None, normal Jaw: None, normal Tongue: None, normal,Extremity Movements Upper (arms, wrists, hands, fingers): None, normal Lower (legs, knees, ankles, toes): None, normal, Trunk Movements Neck, shoulders, hips: None, normal, Overall Severity Severity of abnormal movements (highest score from questions above): None, normal Incapacitation due to abnormal movements: None, normal Patient's awareness of abnormal movements (rate only patient's report): No Awareness, Dental Status Current problems with teeth and/or dentures?: No Does patient usually wear dentures?: No  CIWA:    COWS:     Musculoskeletal: Strength & Muscle Tone: decreased Gait & Station: unsteady Patient leans: N/A   Psychiatric Specialty Exam:  Presentation  General Appearance: Appropriate for Environment; Casual; Neat  Eye Contact:Fair  Speech:Slow  Speech Volume:Decreased  Handedness:Right   Mood and Affect  Mood:Anxious  Affect:Constricted   Thought Process  Thought Processes:Irrevelant  Descriptions of Associations:Loose  Orientation:Partial  Thought Content:Illogical  History of Schizophrenia/Schizoaffective disorder:No data recorded Duration of Psychotic Symptoms:No data recorded Hallucinations:No data recorded Ideas of Reference:None  Suicidal Thoughts:No data recorded Homicidal Thoughts:No data recorded  Sensorium  Memory:Immediate Poor; Remote  Poor  Judgment:Fair  Insight:Fair   Executive Functions  Concentration:Fair  Attention Span:Fair  [place]   Psychomotor Activity  Psychomotor Activity:No data recorded  Assets  Assets:Resilience   Sleep  Sleep:No data recorded   Physical Exam: Physical Exam Vitals and nursing note reviewed.  Constitutional:      Appearance: Normal appearance. He is normal weight.  Neurological:     General: No focal deficit present.     Mental Status: He is alert and oriented to person, place, and time.  Psychiatric:        Attention and Perception: Attention and perception normal.        Mood and Affect: Affect normal. Mood is depressed.        Speech: Speech normal.        Behavior: Behavior normal. Behavior is cooperative.        Thought Content: Thought content normal.        Cognition and Memory: Cognition is impaired. Memory is impaired.        Judgment: Judgment normal.   Review of Systems  Constitutional: Negative.   HENT: Negative.    Eyes: Negative.   Respiratory: Negative.    Cardiovascular: Negative.   Gastrointestinal: Negative.   Genitourinary: Negative.   Musculoskeletal: Negative.   Skin: Negative.   Neurological: Negative.   Endo/Heme/Allergies: Negative.   Psychiatric/Behavioral:  Positive for depression.   Blood pressure 132/71, pulse 70, temperature 97.8 F (36.6 C), temperature source Oral, resp. rate 18, height '5\' 4"'$  (1.626 m), weight 84.3 kg, SpO2 98 %. Body mass index is 31.91 kg/m.   Social History   Tobacco Use  Smoking Status Former   Types: Cigarettes   Quit date: 02/22/1979   Years since quitting: 42.9  Smokeless Tobacco Never   Tobacco Cessation:  N/A, patient does not currently use tobacco products   Blood Alcohol level:  Lab Results  Component Value Date   ETH <10 66/29/4765    Metabolic Disorder Labs:  Lab Results  Component Value Date   HGBA1C 5.6 01/10/2022   MPG 114.02  01/10/2022   MPG 117 08/20/2020   No results found for: PROLACTIN Lab Results  Component Value Date   CHOL 146 01/10/2022   TRIG 52 01/10/2022   HDL 62 01/10/2022   CHOLHDL 2.4 01/10/2022   VLDL  10 01/10/2022   [place] 74 01/10/2022   [place] 69 02/19/2020    See Psychiatric Specialty Exam and Suicide Risk Assessment completed by Attending Physician prior to discharge.  Discharge destination: [facility] assisted living  Is patient on multiple antipsychotic therapies at discharge:  No   Has Patient had three or more failed trials of antipsychotic monotherapy by history:  No  Recommended Plan for Multiple Antipsychotic Therapies: NA   Allergies as of 01/29/2022       Reactions   Penicillins Swelling, Palpitations, Other (See Comments)   Swelling of joints. Has patient had a PCN reaction causing immediate rash, facial/tongue/throat swelling, SOB or lightheadedness with hypotension: Yes Has patient had a PCN reaction causing severe rash involving mucus membranes or skin necrosis: No Has patient had a PCN reaction that required hospitalization: Yes Has patient had a PCN reaction occurring within the last 10 years: No If all of the above answers are "NO", then may proceed with Cephalosporin use.   Shingrix [zoster Vac Recomb Adjuvanted] Rash, Other (See Comments)   Severe rash   Zoloft [sertraline Hcl] Rash        Medication List     STOP taking these medications    doxycycline 100 MG capsule Commonly known as: VIBRAMYCIN       TAKE these medications      Indication  buPROPion 150 MG 24 hr tablet Commonly known as: WELLBUTRIN XL Take 1 tablet (150 mg total) by mouth daily. Start taking on: Jan 30, 2022  Indication: Major Depressive Disorder   donepezil 5 MG tablet Commonly known as: ARICEPT Take 1 tablet (5 mg total) by mouth at bedtime.  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  Indication: Feeling Anxious, Trouble Sleeping   modafinil 200 MG tablet Commonly known as: PROVIGIL Take 1 tablet (200 mg total) by mouth daily. Start taking on: Jan 30, 2022  Indication: Excessive Daytime Sleepiness, Major Depressive Disorder   traZODone 100 MG tablet Commonly known as: DESYREL Take 1 tablet (100 mg total) by mouth at bedtime as needed for sleep.  Indication: Trouble Sleeping, Major Depressive Disorder         Follow-up recommendations:  See Social Work Note    Signed: Parks Ranger, DO 01/30/2022  0800

## 2022-01-29 NOTE — Progress Notes (Signed)
Recreation Therapy Notes  Date: 01/29/2022   Time: 1:35pm    Location: Craft room    Behavioral response: Appropriate   Intervention Topic: Wellness    Discussion/Intervention:  Group content today was focused on Wellness. The group defined wellness and some positive ways they make decisions for themselves. Individuals expressed reasons why they neglected any wellness in the past. Patients described ways to improve wellness skills in the future. The group explained what could happen if they did not do any wellness at all. Participants express how bad choices has affected them and others around them. Individual explained the importance of wellness. The group participated in the intervention "Testing my Wellness" where they had a chance to identify some of their weaknesses and strengths in wellness.  Clinical Observations/Feedback: Patient came to group and was sleep.   Jay Webster 01/29/2022 2:19 PM

## 2022-01-29 NOTE — BH IP Treatment Plan (Signed)
Interdisciplinary Treatment and Diagnostic Plan Update  01/29/2022 Time of Session: 9:30AM Jay Webster MRN: [id number]  Principal Diagnosis: Major depressive disorder, recurrent episode, severe ([place])  Secondary Diagnoses: Principal Problem:   Major depressive disorder, recurrent episode, severe ([place]) Active Problems:   Cognitive and behavioral changes   Current Medications:  Current Facility-Administered Medications  Medication Dose Route Frequency Provider Last Rate Last Admin   acetaminophen (TYLENOL) tablet 650 mg  650 mg Oral Q6H PRN Deloria Lair, NP   650 mg at 01/20/22 2242   alum & mag hydroxide-simeth (MAALOX/MYLANTA) 200-200-20 MG/5ML suspension 30 mL  30 mL Oral Q4H PRN Parks Ranger, DO       buPROPion (WELLBUTRIN XL) 24 hr tablet 150 mg  150 mg Oral Daily Parks Ranger, DO   150 mg at 01/29/22 0916   donepezil (ARICEPT) tablet 5 mg  5 mg Oral QHS Parks Ranger, DO   5 mg at 01/28/22 2153   levothyroxine (SYNTHROID) tablet 125 mcg  125 mcg Oral Daily Parks Ranger, DO   125 mcg at 01/29/22 2751   LORazepam (ATIVAN) tablet 0.5 mg  0.5 mg Oral Q4H PRN Parks Ranger, DO   0.5 mg at 01/26/22 2117   magnesium hydroxide (MILK OF MAGNESIA) suspension 30 mL  30 mL Oral Daily PRN Parks Ranger, DO       modafinil (PROVIGIL) tablet 200 mg  200 mg Oral Daily Parks Ranger, DO   200 mg at 01/29/22 0916   OLANZapine (ZYPREXA) tablet 5 mg  5 mg Oral Q6H PRN Parks Ranger, DO       traZODone (DESYREL) tablet 100 mg  100 mg Oral QHS PRN Parks Ranger, DO   100 mg at 01/27/22 2140   tuberculin injection 5 Units  5 Units Intradermal Once Parks Ranger, DO   5 Units at 01/27/22 1310   PTA Medications: Medications Prior to Admission  Medication Sig Dispense Refill Last Dose   doxycycline (VIBRAMYCIN) 100 MG capsule Take 1 capsule (100 mg total) by mouth 2 (two) times daily. (Patient not  taking: Reported on 01/06/2022) 20 capsule 0     Patient Stressors: Health problems   Medication change or noncompliance    Patient Strengths: Scientist, research (life sciences)  Special hobby/interest  Supportive family/friends   Treatment Modalities: Medication Management, Group therapy, Case management,  1 to 1 session with clinician, Psychoeducation, Recreational therapy.   Physician Treatment Plan for Primary Diagnosis: Major depressive disorder, recurrent episode, severe ([place]) Long Term Goal(s): Improvement in symptoms so as ready for discharge   Short Term Goals: Ability to identify changes in lifestyle to reduce recurrence of condition will improve Ability to verbalize feelings will improve Ability to disclose and discuss suicidal ideas Ability to demonstrate self-control will improve Ability to identify and develop effective coping behaviors will improve Ability to maintain clinical measurements within normal limits will improve Compliance with prescribed medications will improve Ability to identify triggers associated with substance abuse/mental health issues will improve  Medication Management: Evaluate patient's response, side effects, and tolerance of medication regimen.  Therapeutic Interventions: 1 to 1 sessions, Unit Group sessions and Medication administration.  Evaluation of Outcomes: Adequate for Discharge  Physician Treatment Plan for Secondary Diagnosis: Principal Problem:   Major depressive disorder, recurrent episode, severe ([place]) Active Problems:   Cognitive and behavioral changes  Long Term Goal(s): Improvement in symptoms so as ready for discharge   Short Term Goals: Ability to identify changes  in lifestyle to reduce recurrence of condition will improve Ability to verbalize feelings will improve Ability to disclose and discuss suicidal ideas Ability to demonstrate self-control will improve Ability to identify and develop effective coping behaviors will improve Ability  to maintain clinical measurements within normal limits will improve Compliance with prescribed medications will improve Ability to identify triggers associated with substance abuse/mental health issues will improve     Medication Management: Evaluate patient's response, side effects, and tolerance of medication regimen.  Therapeutic Interventions: 1 to 1 sessions, Unit Group sessions and Medication administration.  Evaluation of Outcomes: Adequate for Discharge   RN Treatment Plan for Primary Diagnosis: Major depressive disorder, recurrent episode, severe ([place]) Long Term Goal(s): Knowledge of disease and therapeutic regimen to maintain health will improve  Short Term Goals: Ability to remain free from injury will improve, Ability to verbalize frustration and anger appropriately will improve, Ability to demonstrate self-control, Ability to participate in decision making will improve, Ability to verbalize feelings will improve, Ability to disclose and discuss suicidal ideas, Ability to identify and develop effective coping behaviors will improve, and Compliance with prescribed medications will improve  Medication Management: RN will administer medications as ordered by provider, will assess and evaluate patient's response and provide education to patient for prescribed medication. RN will report any adverse and/or side effects to prescribing provider.  Therapeutic Interventions: 1 on 1 counseling sessions, Psychoeducation, Medication administration, Evaluate responses to treatment, Monitor vital signs and CBGs as ordered, Perform/monitor CIWA, COWS, AIMS and Fall Risk screenings as ordered, Perform wound care treatments as ordered.  Evaluation of Outcomes: Adequate for Discharge   LCSW Treatment Plan for Primary Diagnosis: Major depressive disorder, recurrent episode, severe ([place]) Long Term Goal(s): Safe transition to appropriate next level of care at discharge, Engage patient in therapeutic  group addressing interpersonal concerns.  Short Term Goals: Engage patient in aftercare planning with referrals and resources, Increase social support, Increase ability to appropriately verbalize feelings, Increase emotional regulation, Facilitate acceptance of mental health diagnosis and concerns, Facilitate patient progression through stages of change regarding substance use diagnoses and concerns, and Increase skills for wellness and recovery  Therapeutic Interventions: Assess for all discharge needs, 1 to 1 time with Social worker, Explore available resources and support systems, Assess for adequacy in community support network, Educate family and significant other(s) on suicide prevention, Complete Psychosocial Assessment, Interpersonal group therapy.  Evaluation of Outcomes: Adequate for Discharge   Progress in Treatment: Attending groups: No. Participating in groups: No. Taking medication as prescribed: Yes. Toleration medication: Yes. Family/Significant other contact made: Yes, individual(s) contacted:  SPE completed with pt's niece, Nevin Bloodgood O'Donely  Patient understands diagnosis: No. Discussing patient identified problems/goals with staff: No. Medical problems stabilized or resolved: Yes. Denies suicidal/homicidal ideation: Yes. Issues/concerns per patient self-inventory: No. Other: None  New problem(s) identified: No, Describe:  None  New Short Term/Long Term Goal(s): Patient to work towards medication management for mood stabilization; development of comprehensive mental wellness plan. Update 01/24/2022: No changes at this time. Update 01/29/22: No changes at this time.    Patient Goals:  No additional goals identified at this time. Patient to continue to work towards original goals identified in initial treatment team meeting. CSW will remain available to patient should they voice additional treatment goals. Update 01/24/2022: No changes at this time. Update 01/29/22: No changes at  this time.    Discharge Plan or Barriers: No psychosocial barriers identified at this time, patient to return to place of residence when appropriate for  discharge. Update 01/24/2022: CSW team is assisting patient with placement to Memory Care Unit or [facility]. Update 01/29/22: Patient has been admitted to [facility] in [place], [place]. Patient is scheduled to discharge Friday 01/30/22.    Reason for Continuation of Hospitalization: Depression Medication stabilization   Estimated Length of Stay: TBD  Last 3 [place] Suicide Severity Risk Score: [place] Admission (Current) from 01/07/2022 in [facility] from 01/05/2022 in [facility] from 10/27/2021 in [facility]  C-SSRS RISK CATEGORY High Risk High Risk No Risk       Last PHQ 2/9 Scores:    12/22/2021    4:09 PM 01/21/2021    1:16 PM 08/20/2020    8:18 AM  Depression screen PHQ 2/9  Decreased Interest 0 3 0  Down, Depressed, Hopeless '1 3 1  '$ PHQ - 2 Score '1 6 1  '$ Altered sleeping  0 0  Tired, decreased energy  1 0  Change in appetite  0 0  Feeling bad or failure about yourself   3 0  Trouble concentrating  2 0  Moving slowly or fidgety/restless  1 0  Suicidal thoughts  0 1  PHQ-9 Score  13 2  Difficult doing work/chores  Somewhat difficult Not difficult at all    Scribe for Treatment Team: Mimie Goering A [place], [place] 01/29/2022 9:45 AM

## 2022-01-29 NOTE — Progress Notes (Signed)
Patient denies SI, HI, and AVH. He denies pain and other physical problems. Patient is compliant with scheduled medications. Patient's 1:1 order changed to a close observation. Patient is observed to be sitting in a recliner in the dayroom with staff nearby. Respirations even and unlabored. Patient remains safe on the unit at this time.

## 2022-01-29 NOTE — Progress Notes (Signed)
Nursing 1:1 note D: Pt observed walking to room using walker with sitter at his side. RR even and unlabored. No distress noted. A: Close observation continues for safety  R: Pt remains safe

## 2022-01-29 NOTE — Progress Notes (Signed)
D-Pt is lying in bed with eye open murmuring different little things.  Pt has not verbal expressed any concerns at this time.  Pt's breathing is within the normal range. A- Pt remains on a close obs. for safety reasons. R- Pt's safety has been maintained.

## 2022-01-29 NOTE — NC FL2 (Signed)
[place] LEVEL OF CARE SCREENING TOOL     IDENTIFICATION  Patient Name: Jay Webster Birthdate: 1932-02-25 Sex: male Admission Date (Current Location): 01/07/2022  [facility] and [place] Number:  Engineering geologist and Address:  [facility], [address] [place], [place] [address]      Provider Number: [id number]  Attending Physician Name and Address:  Parks Ranger,*  Relative Name and Phone Number:  Ellis Parents [facility]), [phone] and Nevin Bloodgood O'Donley(Niece), [phone]    Current Level of Care: Hospital Recommended Level of Care: Memory Care, [place] Prior Approval Number:    Date Approved/Denied:   PASRR Number:    Discharge Plan: Other (Comment) (ALF, NF, [facility])    Current Diagnoses: Patient Active Problem List   Diagnosis Date Noted   Major depressive disorder, recurrent episode, severe ([place]) 01/08/2022   Cognitive and behavioral changes 01/07/2022   Abdominal pain 10/30/2021   Open fracture of tuft of distal phalanx of finger 09/16/2021   Depression, major, single episode, moderate ([place]) 03/26/2021   Decreased hearing of both ears 03/26/2021   Decreased visual acuity 03/26/2021   Pulmonary emphysema, unspecified emphysema type ([place]) 02/19/2020   Atherosclerosis of aorta ([place]) 02/19/2020   Memory loss 02/19/2020   Pseudophakia of both eyes 12/13/2019   Former smoker 08/16/2019   Depression, major, single episode, mild ([place]) 08/16/2019   Diffuse large B-cell lymphoma ([place]) 03/30/2018   Degeneration of lumbar intervertebral disc 10/05/2017   History of total hip arthroplasty 10/05/2017   OA (osteoarthritis) of hip 05/31/2013   History of thyroid cancer 04/21/2011   Macular degeneration 01/05/2011   HYPOTHYROIDISM, POSTSURGICAL 11/20/2008   Osteoarthritis 07/01/2007   Uric acid renal calculus 06/20/2007   Essential hypertension 06/20/2007    Orientation RESPIRATION BLADDER Height &  Weight     Self, Place  Normal Incontinent (occasionally, wears briefs) Weight: 185 lb 14.3 oz (84.3 kg) Height:  '5\' 4"'$  (162.6 cm)  BEHAVIORAL SYMPTOMS/MOOD NEUROLOGICAL BOWEL NUTRITION STATUS  Other (Comment) (Redirectable, follows verbal commands)  (N/A) Incontinent (ocassionally, wears briefs) Diet (normal)  AMBULATORY STATUS COMMUNICATION OF NEEDS Skin   Limited Assist (Patient uses 2 front wheel walker) Verbally Normal                       Personal Care Assistance Level of Assistance  Bathing, Dressing Bathing Assistance: Maximum assistance ("fall risk")   Dressing Assistance: Maximum assistance     Functional Limitations Info  Sight Sight Info: Impaired (Poor vision) Hearing Info: Impaired (hard of hearing, does not have hearing aids)      SPECIAL CARE FACTORS FREQUENCY  PT (By licensed PT)     PT Frequency: Patient has been receiving PT inpatient but is at baseline and did not recomment PT continuing PT during inpatient admission              Contractures Contractures Info: Not present    Additional Factors Info  Code Status Code Status Info: DNR             Current Medications (01/29/2022):  This is the current hospital active medication list Current Facility-Administered Medications  Medication Dose Route Frequency Provider Last Rate Last Admin   acetaminophen (TYLENOL) tablet 650 mg  650 mg Oral Q6H PRN Deloria Lair, NP   650 mg at 01/20/22 2242   alum & mag hydroxide-simeth (MAALOX/MYLANTA) 200-200-20 MG/5ML suspension 30 mL  30 mL Oral Q4H PRN Parks Ranger,  DO       buPROPion (WELLBUTRIN XL) 24 hr tablet 150 mg  150 mg Oral Daily Parks Ranger, DO   150 mg at 01/29/22 6195   donepezil (ARICEPT) tablet 5 mg  5 mg Oral QHS Parks Ranger, DO   5 mg at 01/28/22 2153   levothyroxine (SYNTHROID) tablet 125 mcg  125 mcg Oral Daily Parks Ranger, DO   125 mcg at 01/29/22 0932   LORazepam (ATIVAN) tablet 0.5 mg   0.5 mg Oral Q4H PRN Parks Ranger, DO   0.5 mg at 01/26/22 2117   magnesium hydroxide (MILK OF MAGNESIA) suspension 30 mL  30 mL Oral Daily PRN Parks Ranger, DO       modafinil (PROVIGIL) tablet 200 mg  200 mg Oral Daily Parks Ranger, DO   200 mg at 01/29/22 0916   OLANZapine (ZYPREXA) tablet 5 mg  5 mg Oral Q6H PRN Parks Ranger, DO       traZODone (DESYREL) tablet 100 mg  100 mg Oral QHS PRN Parks Ranger, DO   100 mg at 01/27/22 2140     Discharge Medications: Please see discharge summary for a list of discharge medications.  Relevant Imaging Results:  Relevant Lab Results:   Additional Information Memory impairment  Audie Wieser A [place], LCSWA

## 2022-01-29 NOTE — Progress Notes (Signed)
   01/29/22 1950  Psych Admission Type (Psych Patients Only)  Admission Status Involuntary  Psychosocial Assessment  Patient Complaints Anxiety  Eye Contact Fair  Facial Expression Pensive  Affect Anxious;Appropriate to circumstance  Speech Logical/coherent  Interaction Assertive  Motor Activity Slow  Behavior Characteristics Cooperative;Anxious  Mood Anxious;Pleasant  Thought Process  Coherency WDL  Content WDL  Delusions None reported or observed  Perception WDL  Hallucination None reported or observed  Judgment Limited  Confusion Mild  Danger to Self  Current suicidal ideation? Denies  Danger to Others  Danger to Others None reported or observed   Pt seen in his room. Pt is oriented to self. Pt states that he doesn't know where they are sending him tomorrow. Pt informed where he will be going. Pt denies SI, HI, AVH and pain. Pt endorses anxiety 10/10 about the future and his life. Pt denies depression. Pt states he went outside today with the rest of the patients. Pt ambulating better with the walker. Pt endorses good sleep and appetite. Pt spoke to his niece today and said he will continue to communicate with her because she "has good sense." Pt is pleasant.  Pt given scheduled medications as prescribed. PRNs as appropriate. Pt on close observation for safety after falls x 2. Q15 minute checks also for safety. Pt safe on unit.

## 2022-01-29 NOTE — BHH Suicide Risk Assessment (Signed)
[facility] Discharge Suicide Risk Assessment   Principal Problem: Major depressive disorder, recurrent episode, severe ([place]) Discharge Diagnoses: Principal Problem:   Major depressive disorder, recurrent episode, severe ([place]) Active Problems:   Cognitive and behavioral changes   Total Time spent with patient: 1 hour  Musculoskeletal: Strength & Muscle Tone: within normal limits Gait & Station: unable to stand Patient leans: N/A  Psychiatric Specialty Exam  Presentation  General Appearance: Appropriate for Environment; Casual; Neat  Eye Contact:Fair  Speech:Slow  Speech Volume:Decreased  Handedness:Right   Mood and Affect  Mood:Anxious  Duration of Depression Symptoms: No data recorded Affect:Constricted   Thought Process  Thought Processes:Irrevelant  Descriptions of Associations:Loose  Orientation:Partial  Thought Content:Illogical  History of Schizophrenia/Schizoaffective disorder:No data recorded Duration of Psychotic Symptoms:No data recorded Hallucinations:No data recorded Ideas of Reference:None  Suicidal Thoughts:No data recorded Homicidal Thoughts:No data recorded  Sensorium  Memory:Immediate Poor; Remote Poor  Judgment:Fair  Insight:Fair   Executive Functions  Concentration:Fair  Attention Span:Fair  [place]   Psychomotor Activity  Psychomotor Activity:No data recorded  Assets  Assets:Resilience   Sleep  Sleep:No data recorded  Physical Exam: Physical Exam Vitals and nursing note reviewed.  Constitutional:      Appearance: Normal appearance. He is normal weight.  Neurological:     General: No focal deficit present.     Mental Status: He is alert and oriented to person, place, and time.  Psychiatric:        Attention and Perception: Attention and perception normal.        Mood and Affect: Affect normal. Mood is depressed.        Speech: Speech normal.        Behavior: Behavior  normal. Behavior is cooperative.        Thought Content: Thought content normal.        Cognition and Memory: Cognition and memory normal.        Judgment: Judgment normal.   Review of Systems  Constitutional: Negative.   HENT: Negative.    Eyes: Negative.   Respiratory: Negative.    Cardiovascular: Negative.   Gastrointestinal: Negative.   Genitourinary: Negative.   Musculoskeletal: Negative.   Skin: Negative.   Neurological: Negative.   Endo/Heme/Allergies: Negative.   Psychiatric/Behavioral: Negative.    Blood pressure 132/71, pulse 70, temperature 97.8 F (36.6 C), temperature source Oral, resp. rate 18, height '5\' 4"'$  (1.626 m), weight 84.3 kg, SpO2 98 %. Body mass index is 31.91 kg/m.  Mental Status Per Nursing Assessment::   On Admission:  Self-harm behaviors, Self-harm thoughts (hitting self on head)  Demographic Factors:  Male, Age 28 or older, Divorced or widowed, and Caucasian  Loss Factors: Loss of significant relationship  Historical Factors: NA  Risk Reduction Factors:   Living with another person, especially a relative and Positive social support  Cognitive Features That Contribute To Risk:  None    Suicide Risk:  Minimal: No identifiable suicidal ideation.  Patients presenting with no risk factors but with morbid ruminations; may be classified as minimal risk based on the severity of the depressive symptoms    Plan Of Care/Follow-up recommendations: Per [facility] assisted living.   Parks Ranger, DO 01/29/2022, 11:43 AM

## 2022-01-29 NOTE — Progress Notes (Signed)
Patient is sitting in the dayroom in his recliner. Respirations even and unlabored. Patient remains on 1:1 for safety.

## 2022-01-29 NOTE — Plan of Care (Signed)
  Problem: Group Participation Goal: STG - Patient will engage in groups without prompting or encouragement from LRT x3 group sessions within 5 recreation therapy group sessions Description: STG - Patient will engage in groups without prompting or encouragement from LRT x3 group sessions within 5 recreation therapy group sessions Outcome: Not Progressing   

## 2022-01-30 DIAGNOSIS — F03918 Unspecified dementia, unspecified severity, with other behavioral disturbance: Secondary | ICD-10-CM

## 2022-01-30 NOTE — Progress Notes (Signed)
Reviewed discharge instructions with [facility] place including follow up appointment with provider, medication and prescriptions.  Questions answered and understanding verbalized.Discharge packet given. All belongings returned to patient after verification completed by staff.    Patient escorted by staff off unit at this time stable condition without complaint.

## 2022-01-30 NOTE — Plan of Care (Signed)
  Problem: Activity: Goal: Interest or engagement in activities will improve Outcome: Progressing Goal: Sleeping patterns will improve Outcome: Progressing   Problem: Health Behavior/Discharge Planning: Goal: Compliance with treatment plan for underlying cause of condition will improve Outcome: Progressing   Problem: Physical Regulation: Goal: Ability to maintain clinical measurements within normal limits will improve Outcome: Progressing   Problem: Safety: Goal: Periods of time without injury will increase Outcome: Progressing

## 2022-01-30 NOTE — Care Management Important Message (Signed)
Important Message  Patient Details  Name: Jay Webster MRN: [id number] Date of Birth: 1932/05/23   Medicare Important Message Given:  Yes     [name], [name] 01/30/2022, 10:21 AM

## 2022-01-30 NOTE — BHH Counselor (Signed)
CSW discussed discharge planning with pt. Pt stated that he was not interested in follow up care but that he was looking forward to going to his new facility. CSW will provide resources in follow up reminder as needed.   Petar Mucci [place], MSW, LCSW-A 5/26/20239:42 AM

## 2022-01-30 NOTE — Progress Notes (Signed)
Nursing 1:1 note D:Pt observed sleeping in bed with eyes closed. RR even and unlabored. No distress noted. A: close observation continues for safety  R: pt remains safe

## 2022-01-30 NOTE — Progress Notes (Signed)
  [facility] Adult Case Management Discharge Plan :  Will you be returning to the same living situation after discharge:  No. Patient was admitted to [place], [place] At discharge, do you have transportation home?: Yes,  CSW is assisting pt with transportation Do you have the ability to pay for your medications: Yes,  pt has [facility]  Release of information consent forms completed and in the chart;  Patient's signature needed at discharge.  Patient to Follow up at:  [facility] Follow up.   Specialty: Urgent Care Why: Though you declined follow up resources, here is a resource you can utilize as needed. To schedule your follow up appointment for therapy please attend walkl in hours at 7:30am-11am Monday or Wednesday. To schedule your appointment for follow up for medication management, please attend walk in hours at 7:30am-11am Monday, Wednesday-Friday. Contact information: [place] [phone]                Next level of care provider has access to [place] and Suicide Prevention discussed: Yes,  SPE completed with pt's niece, Nevin Bloodgood O'Donley     Has patient been referred to the Quitline?: Patient refused referral  Patient has been referred for addiction treatment: N/A  Marshia Tropea A [place], [place] 01/30/2022, 9:48 AM

## 2022-01-30 NOTE — Progress Notes (Signed)
Patient remain alert, confused and forgetful. Calm and cooperative. Patient is currently in the day room among staff and peers. 1:1 safety observation continues.

## 2022-01-30 NOTE — Progress Notes (Signed)
Nursing 1:1 note D:Pt observed laying in bed composed. RR even and unlabored. No distress noted. A: Close observation continues for safety  R: Pt remains safe

## 2022-01-30 NOTE — NC FL2 (Signed)
[place] LEVEL OF CARE SCREENING TOOL     IDENTIFICATION  Patient Name: Jay Webster Birthdate: 27-May-1932 Sex: male Admission Date (Current Location): 01/07/2022  [facility] and [place] Number:  Engineering geologist and Address:  [facility], [address] [place], [place] [address]      Provider Number: [id number]  Attending Physician Name and Address:  Parks Ranger,*  Relative Name and Phone Number:  Ellis Parents [facility]), [phone] and Isaac Laud (Niece), [phone]    Current Level of Care: Hospital Recommended Level of Care: Memory Care, [place] Prior Approval Number:    Date Approved/Denied:   PASRR Number:    Discharge Plan: Other (Comment) (ALF, [facility], SNF)    Current Diagnoses: Patient Active Problem List   Diagnosis Date Noted   Dementia with behavioral disturbance ([place]) 01/30/2022   Major depressive disorder, recurrent episode, severe ([place]) 01/08/2022   Cognitive and behavioral changes 01/07/2022   Abdominal pain 10/30/2021   Open fracture of tuft of distal phalanx of finger 09/16/2021   Depression, major, single episode, moderate ([place]) 03/26/2021   Decreased hearing of both ears 03/26/2021   Decreased visual acuity 03/26/2021   Pulmonary emphysema, unspecified emphysema type ([place]) 02/19/2020   Atherosclerosis of aorta ([place]) 02/19/2020   Memory loss 02/19/2020   Pseudophakia of both eyes 12/13/2019   Former smoker 08/16/2019   Depression, major, single episode, mild ([place]) 08/16/2019   Diffuse large B-cell lymphoma ([place]) 03/30/2018   Degeneration of lumbar intervertebral disc 10/05/2017   History of total hip arthroplasty 10/05/2017   OA (osteoarthritis) of hip 05/31/2013   History of thyroid cancer 04/21/2011   Macular degeneration 01/05/2011   HYPOTHYROIDISM, POSTSURGICAL 11/20/2008   Osteoarthritis 07/01/2007   Uric acid renal calculus 06/20/2007   Essential hypertension  06/20/2007    Orientation RESPIRATION BLADDER Height & Weight     Self, Place  Normal Incontinent (occasionally, wears briefs) Weight: 185 lb 14.3 oz (84.3 kg) Height:  '5\' 4"'$  (162.6 cm)  BEHAVIORAL SYMPTOMS/MOOD NEUROLOGICAL BOWEL NUTRITION STATUS  Other (Comment) (Redirectable, follows verbal commands)  (N/A) Incontinent (ocassionally, wears briefs) Diet (normal)  AMBULATORY STATUS COMMUNICATION OF NEEDS Skin   Limited Assist (Patient uses 2 front wheel walker) Verbally Normal                       Personal Care Assistance Level of Assistance  Bathing, Dressing Bathing Assistance: Maximum assistance ("fall risk")   Dressing Assistance: Maximum assistance     Functional Limitations Info  Sight Sight Info: Impaired (Poor vision) Hearing Info: Impaired (hard of hearing, does not have hearing aids)      SPECIAL CARE FACTORS FREQUENCY  PT (By licensed PT)     PT Frequency: Patient has been receiving PT inpatient but is at baseline and did not recomment PT continuing PT during inpatient admission              Contractures Contractures Info: Not present    Additional Factors Info  Code Status Code Status Info: DNR             Current Medications (01/30/2022):  This is the current hospital active medication list Current Facility-Administered Medications  Medication Dose Route Frequency Provider Last Rate Last Admin   acetaminophen (TYLENOL) tablet 650 mg  650 mg Oral Q6H PRN Deloria Lair, NP   650 mg at 01/20/22 2242   alum & mag hydroxide-simeth (MAALOX/MYLANTA) 200-200-20 MG/5ML suspension 30 mL  30 mL Oral Q4H PRN Parks Ranger, DO       buPROPion (WELLBUTRIN XL) 24 hr tablet 150 mg  150 mg Oral Daily Parks Ranger, DO   150 mg at 01/30/22 0900   donepezil (ARICEPT) tablet 5 mg  5 mg Oral QHS Parks Ranger, DO   5 mg at 01/29/22 2135   levothyroxine (SYNTHROID) tablet 125 mcg  125 mcg Oral Daily Parks Ranger, DO    125 mcg at 01/30/22 5784   LORazepam (ATIVAN) tablet 0.5 mg  0.5 mg Oral Q4H PRN Parks Ranger, DO   0.5 mg at 01/29/22 2135   magnesium hydroxide (MILK OF MAGNESIA) suspension 30 mL  30 mL Oral Daily PRN Parks Ranger, DO       modafinil (PROVIGIL) tablet 200 mg  200 mg Oral Daily Parks Ranger, DO   200 mg at 01/30/22 0900   OLANZapine (ZYPREXA) tablet 5 mg  5 mg Oral Q6H PRN Parks Ranger, DO       traZODone (DESYREL) tablet 100 mg  100 mg Oral QHS PRN Parks Ranger, DO   100 mg at 01/29/22 2135     Discharge Medications: Please see discharge summary for a list of discharge medications.  Relevant Imaging Results:  Relevant Lab Results:   Additional Information Memory impairment  Nori Winegar A [place], LCSWA

## 2022-01-30 NOTE — Plan of Care (Signed)
Patient remain alert and oriented, calm and cooperative during assessment. Denies SI, HI, and AVH. Endorsed anxiety of 8/10  due to not knowing what will happen. Also endorsed depression of 7/10. Denies pain or discomfort at this time. Compliant with all due medications. Ate breakfast in the day room among staff and peers with good appetite. Remain on 1:1 safety observation.   Problem: Education: Goal: Knowledge of [facility] Education information/materials will improve Outcome: Progressing Goal: Emotional status will improve Outcome: Progressing Goal: Mental status will improve Outcome: Progressing Goal: Verbalization of understanding the information provided will improve Outcome: Progressing   Problem: Activity: Goal: Interest or engagement in activities will improve Outcome: Progressing Goal: Sleeping patterns will improve Outcome: Progressing   Problem: Coping: Goal: Ability to verbalize frustrations and anger appropriately will improve Outcome: Progressing Goal: Ability to demonstrate self-control will improve Outcome: Progressing   Problem: Health Behavior/Discharge Planning: Goal: Identification of resources available to assist in meeting health care needs will improve Outcome: Progressing Goal: Compliance with treatment plan for underlying cause of condition will improve Outcome: Progressing   Problem: Physical Regulation: Goal: Ability to maintain clinical measurements within normal limits will improve Outcome: Progressing   Problem: Safety: Goal: Periods of time without injury will increase Outcome: Progressing   Problem: Education: Goal: Ability to make informed decisions regarding treatment will improve Outcome: Progressing   Problem: Health Behavior/Discharge Planning: Goal: Identification of resources available to assist in meeting health care needs will improve Outcome: Progressing   Problem: Medication: Goal: Compliance with prescribed  medication regimen will improve Outcome: Progressing   Problem: Self-Concept: Goal: Will verbalize positive feelings about self Outcome: Progressing   Problem: Activity: Goal: Will identify at least one activity in which they can participate Outcome: Progressing   Problem: Coping: Goal: Ability to identify and develop effective coping behavior will improve Outcome: Progressing Goal: Ability to interact with others will improve Outcome: Progressing Goal: Demonstration of participation in decision-making regarding own care will improve Outcome: Progressing Goal: Ability to use eye contact when communicating with others will improve Outcome: Progressing   Problem: Health Behavior/Discharge Planning: Goal: Identification of resources available to assist in meeting health care needs will improve Outcome: Progressing   Problem: Self-Concept: Goal: Will verbalize positive feelings about self Outcome: Progressing

## 2022-02-03 ENCOUNTER — Ambulatory Visit (HOSPITAL_BASED_OUTPATIENT_CLINIC_OR_DEPARTMENT_OTHER): Payer: Medicare PPO | Admitting: Family Medicine

## 2022-02-04 DIAGNOSIS — I10 Essential (primary) hypertension: Secondary | ICD-10-CM | POA: Diagnosis not present

## 2022-02-04 DIAGNOSIS — R4586 Emotional lability: Secondary | ICD-10-CM | POA: Diagnosis not present

## 2022-02-04 DIAGNOSIS — R4189 Other symptoms and signs involving cognitive functions and awareness: Secondary | ICD-10-CM | POA: Diagnosis not present

## 2022-02-04 DIAGNOSIS — C8330 Diffuse large B-cell lymphoma, unspecified site: Secondary | ICD-10-CM | POA: Diagnosis not present

## 2022-02-18 DIAGNOSIS — R45851 Suicidal ideations: Secondary | ICD-10-CM | POA: Diagnosis not present

## 2022-02-18 DIAGNOSIS — F32A Depression, unspecified: Secondary | ICD-10-CM | POA: Diagnosis not present

## 2022-02-18 DIAGNOSIS — R4189 Other symptoms and signs involving cognitive functions and awareness: Secondary | ICD-10-CM | POA: Diagnosis not present

## 2022-02-18 DIAGNOSIS — G479 Sleep disorder, unspecified: Secondary | ICD-10-CM | POA: Diagnosis not present

## 2022-02-25 DIAGNOSIS — F5102 Adjustment insomnia: Secondary | ICD-10-CM | POA: Diagnosis not present

## 2022-02-25 DIAGNOSIS — F331 Major depressive disorder, recurrent, moderate: Secondary | ICD-10-CM | POA: Diagnosis not present

## 2022-02-25 DIAGNOSIS — R451 Restlessness and agitation: Secondary | ICD-10-CM | POA: Diagnosis not present

## 2022-02-25 DIAGNOSIS — F03918 Unspecified dementia, unspecified severity, with other behavioral disturbance: Secondary | ICD-10-CM | POA: Diagnosis not present

## 2022-02-25 DIAGNOSIS — F59 Unspecified behavioral syndromes associated with physiological disturbances and physical factors: Secondary | ICD-10-CM | POA: Diagnosis not present

## 2022-02-26 DIAGNOSIS — Z79899 Other long term (current) drug therapy: Secondary | ICD-10-CM | POA: Diagnosis not present

## 2022-03-06 DIAGNOSIS — F03918 Unspecified dementia, unspecified severity, with other behavioral disturbance: Secondary | ICD-10-CM | POA: Diagnosis not present

## 2022-03-06 DIAGNOSIS — R451 Restlessness and agitation: Secondary | ICD-10-CM | POA: Diagnosis not present

## 2022-03-06 DIAGNOSIS — F59 Unspecified behavioral syndromes associated with physiological disturbances and physical factors: Secondary | ICD-10-CM | POA: Diagnosis not present

## 2022-03-16 ENCOUNTER — Other Ambulatory Visit: Payer: Medicare PPO

## 2022-03-16 ENCOUNTER — Ambulatory Visit: Payer: Medicare PPO | Admitting: Hematology

## 2022-04-08 DIAGNOSIS — R4189 Other symptoms and signs involving cognitive functions and awareness: Secondary | ICD-10-CM | POA: Diagnosis not present

## 2022-04-08 DIAGNOSIS — R7303 Prediabetes: Secondary | ICD-10-CM | POA: Diagnosis not present

## 2022-04-08 DIAGNOSIS — F03918 Unspecified dementia, unspecified severity, with other behavioral disturbance: Secondary | ICD-10-CM | POA: Diagnosis not present

## 2022-04-08 DIAGNOSIS — F59 Unspecified behavioral syndromes associated with physiological disturbances and physical factors: Secondary | ICD-10-CM | POA: Diagnosis not present

## 2022-04-08 DIAGNOSIS — I10 Essential (primary) hypertension: Secondary | ICD-10-CM | POA: Diagnosis not present

## 2022-04-08 DIAGNOSIS — I700 Atherosclerosis of aorta: Secondary | ICD-10-CM | POA: Diagnosis not present

## 2022-04-08 DIAGNOSIS — R451 Restlessness and agitation: Secondary | ICD-10-CM | POA: Diagnosis not present

## 2022-04-09 DIAGNOSIS — E890 Postprocedural hypothyroidism: Secondary | ICD-10-CM | POA: Diagnosis not present

## 2022-05-21 DIAGNOSIS — E890 Postprocedural hypothyroidism: Secondary | ICD-10-CM | POA: Diagnosis not present

## 2022-05-21 DIAGNOSIS — Z85850 Personal history of malignant neoplasm of thyroid: Secondary | ICD-10-CM | POA: Diagnosis not present

## 2022-05-21 DIAGNOSIS — R7303 Prediabetes: Secondary | ICD-10-CM | POA: Diagnosis not present

## 2022-05-27 DIAGNOSIS — E890 Postprocedural hypothyroidism: Secondary | ICD-10-CM | POA: Diagnosis not present

## 2022-05-27 DIAGNOSIS — R4189 Other symptoms and signs involving cognitive functions and awareness: Secondary | ICD-10-CM | POA: Diagnosis not present

## 2022-05-27 DIAGNOSIS — R45851 Suicidal ideations: Secondary | ICD-10-CM | POA: Diagnosis not present

## 2022-05-27 DIAGNOSIS — R7303 Prediabetes: Secondary | ICD-10-CM | POA: Diagnosis not present

## 2022-08-05 DIAGNOSIS — I10 Essential (primary) hypertension: Secondary | ICD-10-CM | POA: Diagnosis not present

## 2022-08-05 DIAGNOSIS — E890 Postprocedural hypothyroidism: Secondary | ICD-10-CM | POA: Diagnosis not present

## 2022-08-05 DIAGNOSIS — N202 Calculus of kidney with calculus of ureter: Secondary | ICD-10-CM | POA: Diagnosis not present

## 2022-08-05 DIAGNOSIS — C8330 Diffuse large B-cell lymphoma, unspecified site: Secondary | ICD-10-CM | POA: Diagnosis not present

## 2022-08-05 DIAGNOSIS — H3530 Unspecified macular degeneration: Secondary | ICD-10-CM | POA: Diagnosis not present

## 2022-08-13 DIAGNOSIS — R0981 Nasal congestion: Secondary | ICD-10-CM | POA: Diagnosis not present

## 2022-08-13 DIAGNOSIS — R051 Acute cough: Secondary | ICD-10-CM | POA: Diagnosis not present

## 2022-08-13 DIAGNOSIS — J00 Acute nasopharyngitis [common cold]: Secondary | ICD-10-CM | POA: Diagnosis not present

## 2022-08-13 DIAGNOSIS — Z1152 Encounter for screening for COVID-19: Secondary | ICD-10-CM | POA: Diagnosis not present

## 2022-08-25 DIAGNOSIS — R7303 Prediabetes: Secondary | ICD-10-CM | POA: Diagnosis not present

## 2022-08-25 DIAGNOSIS — Z85850 Personal history of malignant neoplasm of thyroid: Secondary | ICD-10-CM | POA: Diagnosis not present

## 2022-08-25 DIAGNOSIS — E890 Postprocedural hypothyroidism: Secondary | ICD-10-CM | POA: Diagnosis not present

## 2022-08-25 DIAGNOSIS — D519 Vitamin B12 deficiency anemia, unspecified: Secondary | ICD-10-CM | POA: Diagnosis not present

## 2022-08-25 DIAGNOSIS — E559 Vitamin D deficiency, unspecified: Secondary | ICD-10-CM | POA: Diagnosis not present

## 2022-08-25 DIAGNOSIS — I10 Essential (primary) hypertension: Secondary | ICD-10-CM | POA: Diagnosis not present

## 2022-09-01 DIAGNOSIS — R718 Other abnormality of red blood cells: Secondary | ICD-10-CM | POA: Diagnosis not present

## 2022-09-01 DIAGNOSIS — D509 Iron deficiency anemia, unspecified: Secondary | ICD-10-CM | POA: Diagnosis not present

## 2022-09-01 DIAGNOSIS — R7303 Prediabetes: Secondary | ICD-10-CM | POA: Diagnosis not present

## 2022-09-01 DIAGNOSIS — R634 Abnormal weight loss: Secondary | ICD-10-CM | POA: Diagnosis not present

## 2022-09-03 DIAGNOSIS — D509 Iron deficiency anemia, unspecified: Secondary | ICD-10-CM | POA: Diagnosis not present

## 2022-09-09 DIAGNOSIS — D509 Iron deficiency anemia, unspecified: Secondary | ICD-10-CM | POA: Diagnosis not present

## 2022-09-14 DIAGNOSIS — U071 COVID-19: Secondary | ICD-10-CM | POA: Diagnosis not present

## 2022-09-16 DIAGNOSIS — F03918 Unspecified dementia, unspecified severity, with other behavioral disturbance: Secondary | ICD-10-CM | POA: Diagnosis not present

## 2022-09-28 DIAGNOSIS — F5102 Adjustment insomnia: Secondary | ICD-10-CM | POA: Diagnosis not present

## 2022-09-28 DIAGNOSIS — R451 Restlessness and agitation: Secondary | ICD-10-CM | POA: Diagnosis not present

## 2022-09-28 DIAGNOSIS — F331 Major depressive disorder, recurrent, moderate: Secondary | ICD-10-CM | POA: Diagnosis not present

## 2022-09-28 DIAGNOSIS — F03918 Unspecified dementia, unspecified severity, with other behavioral disturbance: Secondary | ICD-10-CM | POA: Diagnosis not present

## 2022-09-28 DIAGNOSIS — F59 Unspecified behavioral syndromes associated with physiological disturbances and physical factors: Secondary | ICD-10-CM | POA: Diagnosis not present

## 2022-10-14 DIAGNOSIS — R451 Restlessness and agitation: Secondary | ICD-10-CM | POA: Diagnosis not present

## 2022-10-15 DIAGNOSIS — D509 Iron deficiency anemia, unspecified: Secondary | ICD-10-CM | POA: Diagnosis not present

## 2022-10-16 DIAGNOSIS — N390 Urinary tract infection, site not specified: Secondary | ICD-10-CM | POA: Diagnosis not present

## 2022-10-21 DIAGNOSIS — D509 Iron deficiency anemia, unspecified: Secondary | ICD-10-CM | POA: Diagnosis not present

## 2022-10-22 DIAGNOSIS — N390 Urinary tract infection, site not specified: Secondary | ICD-10-CM | POA: Diagnosis not present

## 2022-10-28 DIAGNOSIS — D509 Iron deficiency anemia, unspecified: Secondary | ICD-10-CM | POA: Diagnosis not present

## 2022-10-28 DIAGNOSIS — R7303 Prediabetes: Secondary | ICD-10-CM | POA: Diagnosis not present

## 2022-10-28 DIAGNOSIS — F03918 Unspecified dementia, unspecified severity, with other behavioral disturbance: Secondary | ICD-10-CM | POA: Diagnosis not present

## 2022-10-28 DIAGNOSIS — R634 Abnormal weight loss: Secondary | ICD-10-CM | POA: Diagnosis not present

## 2022-10-28 DIAGNOSIS — F331 Major depressive disorder, recurrent, moderate: Secondary | ICD-10-CM | POA: Diagnosis not present

## 2022-10-28 DIAGNOSIS — I10 Essential (primary) hypertension: Secondary | ICD-10-CM | POA: Diagnosis not present

## 2022-10-28 DIAGNOSIS — F59 Unspecified behavioral syndromes associated with physiological disturbances and physical factors: Secondary | ICD-10-CM | POA: Diagnosis not present

## 2022-10-28 DIAGNOSIS — F5102 Adjustment insomnia: Secondary | ICD-10-CM | POA: Diagnosis not present

## 2022-10-28 DIAGNOSIS — R451 Restlessness and agitation: Secondary | ICD-10-CM | POA: Diagnosis not present

## 2022-10-28 DIAGNOSIS — E890 Postprocedural hypothyroidism: Secondary | ICD-10-CM | POA: Diagnosis not present

## 2022-11-04 DIAGNOSIS — Z79899 Other long term (current) drug therapy: Secondary | ICD-10-CM | POA: Diagnosis not present

## 2022-11-08 ENCOUNTER — Emergency Department (HOSPITAL_COMMUNITY)
Admission: EM | Admit: 2022-11-08 | Discharge: 2022-11-08 | Disposition: A | Discharge status: Skilled Nursing Facility | Payer: Medicare PPO | Attending: Emergency Medicine | Admitting: Emergency Medicine

## 2022-11-08 ENCOUNTER — Other Ambulatory Visit: Payer: Self-pay

## 2022-11-08 ENCOUNTER — Emergency Department (HOSPITAL_COMMUNITY): Payer: Medicare PPO

## 2022-11-08 DIAGNOSIS — Z79899 Other long term (current) drug therapy: Secondary | ICD-10-CM | POA: Diagnosis not present

## 2022-11-08 DIAGNOSIS — W19XXXA Unspecified fall, initial encounter: Secondary | ICD-10-CM

## 2022-11-08 DIAGNOSIS — W050XXA Fall from non-moving wheelchair, initial encounter: Secondary | ICD-10-CM | POA: Insufficient documentation

## 2022-11-08 DIAGNOSIS — R531 Weakness: Secondary | ICD-10-CM | POA: Diagnosis not present

## 2022-11-08 DIAGNOSIS — S0081XA Abrasion of other part of head, initial encounter: Secondary | ICD-10-CM | POA: Diagnosis not present

## 2022-11-08 DIAGNOSIS — T148XXA Other injury of unspecified body region, initial encounter: Secondary | ICD-10-CM

## 2022-11-08 DIAGNOSIS — S0990XA Unspecified injury of head, initial encounter: Secondary | ICD-10-CM | POA: Diagnosis not present

## 2022-11-08 DIAGNOSIS — Z85850 Personal history of malignant neoplasm of thyroid: Secondary | ICD-10-CM | POA: Diagnosis not present

## 2022-11-08 DIAGNOSIS — F0390 Unspecified dementia without behavioral disturbance: Secondary | ICD-10-CM | POA: Diagnosis not present

## 2022-11-08 DIAGNOSIS — M47812 Spondylosis without myelopathy or radiculopathy, cervical region: Secondary | ICD-10-CM | POA: Diagnosis not present

## 2022-11-08 DIAGNOSIS — Z043 Encounter for examination and observation following other accident: Secondary | ICD-10-CM | POA: Diagnosis not present

## 2022-11-08 DIAGNOSIS — I10 Essential (primary) hypertension: Secondary | ICD-10-CM | POA: Insufficient documentation

## 2022-11-08 DIAGNOSIS — G319 Degenerative disease of nervous system, unspecified: Secondary | ICD-10-CM | POA: Diagnosis not present

## 2022-11-08 DIAGNOSIS — R102 Pelvic and perineal pain: Secondary | ICD-10-CM | POA: Diagnosis not present

## 2022-11-08 DIAGNOSIS — Z7401 Bed confinement status: Secondary | ICD-10-CM | POA: Diagnosis not present

## 2022-11-08 DIAGNOSIS — R918 Other nonspecific abnormal finding of lung field: Secondary | ICD-10-CM | POA: Diagnosis not present

## 2022-11-08 NOTE — ED Provider Notes (Addendum)
[facility] Provider Note   CSN: [id number] Arrival date & time: 11/08/22  1057     History  Chief Complaint  Patient presents with   Jay Webster is a 89 y.o. male with history of hypertension, thyroid disease, right lateral branch block, thyroid cancer, dementia who presents the emergency department from [facility] memory care unit after a fall.  Per EMS patient was taking a nap in his wheelchair and fell out of it.  Unknown if the fall was witnessed or not.  Staff reported that patient is at his baseline mental status.  Patient is not on chronic anticoagulation.  Level 5 caveat due to dementia   Fall       Home Medications Prior to Admission medications   Medication Sig Start Date End Date Taking? Authorizing Provider  buPROPion (WELLBUTRIN XL) 150 MG 24 hr tablet Take 1 tablet (150 mg total) by mouth daily. 01/30/22   Parks Ranger, DO  donepezil (ARICEPT) 5 MG tablet Take 1 tablet (5 mg total) by mouth at bedtime. 01/29/22   Parks Ranger, DO  levothyroxine (SYNTHROID) 125 MCG tablet TAKE 1 TABLET BY MOUTH EVERY DAY 01/24/22   [place], Blondell Reveal, MD  LORazepam (ATIVAN) 0.5 MG tablet Take 1 tablet (0.5 mg total) by mouth every 4 (four) hours as needed for anxiety. 01/29/22   Parks Ranger, DO  modafinil (PROVIGIL) 200 MG tablet Take 1 tablet (200 mg total) by mouth daily. 01/30/22   Parks Ranger, DO  traZODone (DESYREL) 100 MG tablet Take 1 tablet (100 mg total) by mouth at bedtime as needed for sleep. 01/29/22   Parks Ranger, DO      Allergies    Penicillins, Shingrix [zoster vac recomb adjuvanted], and Zoloft [sertraline hcl]    Review of Systems   Review of Systems  Unable to perform ROS: Dementia    Physical Exam Updated Vital Signs BP 125/73 (BP Location: Left Arm)   Pulse 61   Temp 97.6 F (36.4 C) (Oral)   Resp 16   Ht '5\' 4"'$  (1.626 m)   Wt 84.3 kg    SpO2 99%   BMI 31.90 kg/m  Physical Exam Vitals and nursing note reviewed.  Constitutional:      Appearance: Normal appearance.  HENT:     Head: Normocephalic.      Comments: Abrasion noted to the forehead Eyes:     Conjunctiva/sclera: Conjunctivae normal.  Cardiovascular:     Rate and Rhythm: Normal rate and regular rhythm.  Pulmonary:     Effort: Pulmonary effort is normal. No respiratory distress.     Breath sounds: Normal breath sounds.  Chest:     Comments: Chest wall stable, no revealing skin changes Abdominal:     General: There is no distension.     Palpations: Abdomen is soft.     Tenderness: There is no abdominal tenderness.  Musculoskeletal:     Comments: No midline spinal tenderness, step-offs or crepitus.  No overlying skin changes noted to the back.  Pelvis stable.  Ranging all extremities without difficulty  Compartments of the extremity soft and nontender  Skin:    General: Skin is warm and dry.     Findings: No rash or wound.  Neurological:     General: No focal deficit present.     Mental Status: He is alert.     Comments: Oriented to person and event.  Still believes that he lives at home.  Does not know the year.  Speech is clear, able to follow commands. CN III-XII intact grossly intact. PERRLA. EOMI. Sensation intact throughout. Str 5/5 all extremities.     ED Results / Procedures / Treatments   Labs (all labs ordered are listed, but only abnormal results are displayed) Labs Reviewed - No data to display  EKG None  Radiology CT Head Wo Contrast  Result Date: 11/08/2022 CLINICAL DATA:  [place] out of his wheelchair. EXAM: CT HEAD WITHOUT CONTRAST CT CERVICAL SPINE WITHOUT CONTRAST TECHNIQUE: Multidetector CT imaging of the head and cervical spine was performed following the standard protocol without intravenous contrast. Multiplanar CT image reconstructions of the cervical spine were also generated. RADIATION DOSE REDUCTION: This exam was  performed according to the departmental dose-optimization program which includes automated exposure control, adjustment of the mA and/or kV according to patient size and/or use of iterative reconstruction technique. COMPARISON:  CT neck dated Jan 26, 2014. FINDINGS: CT HEAD FINDINGS Brain: No evidence of acute infarction, hemorrhage, hydrocephalus, extra-axial collection or mass lesion/mass effect. Moderate generalized cerebral atrophy with ex vacuo ventricular dilatation. Vascular: Atherosclerotic vascular calcification of the carotid siphons. No hyperdense vessel. Skull: Normal. Negative for fracture or focal lesion. Sinuses/Orbits: No acute finding. Other: None. CT CERVICAL SPINE FINDINGS Alignment: Straightening of the normal cervical lordosis. No traumatic malalignment. Skull base and vertebrae: No acute fracture. No primary bone lesion or focal pathologic process. Soft tissues and spinal canal: No prevertebral fluid or swelling. No visible canal hematoma. Disc levels: Severe multilevel disc height loss and uncovertebral hypertrophy from C3-C4 through C6-C7. Facet ankylosis at C2-C3 and C3-C4. Upper chest: Negative. Other: Prior thyroidectomy. IMPRESSION: 1. No acute intracranial abnormality. Moderate generalized cerebral atrophy. 2. No acute cervical spine fracture or traumatic malalignment. Advanced multilevel cervical spondylosis. Electronically Signed   By: Titus Dubin M.D.   On: 11/08/2022 12:45   CT Cervical Spine Wo Contrast  Result Date: 11/08/2022 CLINICAL DATA:  [place] out of his wheelchair. EXAM: CT HEAD WITHOUT CONTRAST CT CERVICAL SPINE WITHOUT CONTRAST TECHNIQUE: Multidetector CT imaging of the head and cervical spine was performed following the standard protocol without intravenous contrast. Multiplanar CT image reconstructions of the cervical spine were also generated. RADIATION DOSE REDUCTION: This exam was performed according to the departmental dose-optimization program which includes  automated exposure control, adjustment of the mA and/or kV according to patient size and/or use of iterative reconstruction technique. COMPARISON:  CT neck dated Jan 26, 2014. FINDINGS: CT HEAD FINDINGS Brain: No evidence of acute infarction, hemorrhage, hydrocephalus, extra-axial collection or mass lesion/mass effect. Moderate generalized cerebral atrophy with ex vacuo ventricular dilatation. Vascular: Atherosclerotic vascular calcification of the carotid siphons. No hyperdense vessel. Skull: Normal. Negative for fracture or focal lesion. Sinuses/Orbits: No acute finding. Other: None. CT CERVICAL SPINE FINDINGS Alignment: Straightening of the normal cervical lordosis. No traumatic malalignment. Skull base and vertebrae: No acute fracture. No primary bone lesion or focal pathologic process. Soft tissues and spinal canal: No prevertebral fluid or swelling. No visible canal hematoma. Disc levels: Severe multilevel disc height loss and uncovertebral hypertrophy from C3-C4 through C6-C7. Facet ankylosis at C2-C3 and C3-C4. Upper chest: Negative. Other: Prior thyroidectomy. IMPRESSION: 1. No acute intracranial abnormality. Moderate generalized cerebral atrophy. 2. No acute cervical spine fracture or traumatic malalignment. Advanced multilevel cervical spondylosis. Electronically Signed   By: Titus Dubin M.D.   On: 11/08/2022 12:45   DG Pelvis 1-2 Views  Result Date: 11/08/2022 CLINICAL DATA:  Pain after fall EXAM: PELVIS - 1-2 VIEW COMPARISON:  None Available. FINDINGS: The patient is status post left hip replacement. Visualized hardware is in good position. No acute fractures are noted on today's study. IMPRESSION: No acute fractures. Left hip replacement. Electronically Signed   By: Dorise Bullion III M.D.   On: 11/08/2022 12:23   DG Chest 2 View  Result Date: 11/08/2022 CLINICAL DATA:  Fall. EXAM: CHEST - 2 VIEW COMPARISON:  Chest x-ray September 17, 2018 FINDINGS: A vascular stent is identified in the right  upper chest. Surgical clips are identified in the base of the neck/thoracic inlet region. No pneumothorax. The cardiomediastinal silhouette is stable. No nodules or masses. Mild bibasilar opacities. No overt edema. IMPRESSION: Mild bibasilar opacities are favored to represent atelectasis given history. No other acute abnormalities. Electronically Signed   By: Dorise Bullion III M.D.   On: 11/08/2022 12:22    Procedures Procedures    Medications Ordered in ED Medications - No data to display  ED Course/ Medical Decision Making/ A&P                             Medical Decision Making Amount and/or Complexity of Data Reviewed Radiology: ordered.   This patient is a 89 y.o. male  who presents to the ED for concern of fall from wheelchair with abrasion to the head.   Differential diagnoses prior to evaluation: The emergent differential diagnosis includes, but is not limited to,  mechanical fall, syncope, acute trauma. This is not an exhaustive differential.   Past Medical History / Co-morbidities: hypertension, thyroid disease, right lateral branch block, thyroid cancer, dementia  Physical Exam: Physical exam performed. The pertinent findings include: Patient pleasantly demented, at baseline mental status.  Small abrasion noted to the forehead, no other traumatic findings found on exam.  No midline spinal tenderness, step-offs or crepitus.  Chest wall and pelvis stable.  Compartments of extremity soft and without tenderness.  Lab Tests/Imaging studies: I personally interpreted labs/imaging and the pertinent results include: CT head and cervical spine, x-rays of chest and pelvis showed no acute traumatic findings.. I agree with the radiologist interpretation.  Disposition: After consideration of the diagnostic results and the patients response to treatment, I feel that emergency department workup does not suggest an emergent condition requiring admission or immediate intervention beyond  what has been performed at this time. The plan is: Discharge back to facility. The patient is safe for discharge and has been instructed to return immediately for worsening symptoms, change in symptoms or any other concerns.  Final Clinical Impression(s) / ED Diagnoses Final diagnoses:  Fall, initial encounter  Abrasion    Rx / DC Orders ED Discharge Orders     None      Portions of this report may have been transcribed using voice recognition software. Every effort was made to ensure accuracy; however, inadvertent computerized transcription errors may be present.    Kateri Plummer, PA-C 11/08/22 1340    Jathen Sudano T, PA-C 11/08/22 1340    Pattricia Boss, MD 11/08/22 1600

## 2022-11-08 NOTE — Discharge Instructions (Addendum)
[place] was in in the ER today after a fall.  We managed his head, neck, chest and hips and did not find any evidence of broken bones or bleeding.   Continue to monitor how he is doing and return to the ER for any new or worsening symptoms.

## 2022-11-08 NOTE — ED Notes (Signed)
Patient transported to radiology

## 2022-11-08 NOTE — ED Triage Notes (Signed)
Patient BIB EMS and presents from [facility]. Per EMS, Pt was taking a nap in his wheelchair and fell out of it. EMS is unsure if the fall was unwitnessed. EMS only noted a small skin tear to his head.   Pt a/o x 2 (self and event) at baseline and EMS reports he is presenting to the ED at his mental baseline.  No LOC, blood thinners, or any other obvious injuries per EMS.

## 2022-11-08 NOTE — ED Notes (Signed)
[facility] called for transport.  

## 2022-11-11 DIAGNOSIS — Z9181 History of falling: Secondary | ICD-10-CM | POA: Diagnosis not present

## 2022-11-11 DIAGNOSIS — F03918 Unspecified dementia, unspecified severity, with other behavioral disturbance: Secondary | ICD-10-CM | POA: Diagnosis not present

## 2022-11-11 DIAGNOSIS — R296 Repeated falls: Secondary | ICD-10-CM | POA: Diagnosis not present

## 2022-11-11 DIAGNOSIS — F5102 Adjustment insomnia: Secondary | ICD-10-CM | POA: Diagnosis not present

## 2022-11-16 DIAGNOSIS — F332 Major depressive disorder, recurrent severe without psychotic features: Secondary | ICD-10-CM | POA: Diagnosis not present

## 2022-11-16 DIAGNOSIS — I4510 Unspecified right bundle-branch block: Secondary | ICD-10-CM | POA: Diagnosis not present

## 2022-11-16 DIAGNOSIS — I700 Atherosclerosis of aorta: Secondary | ICD-10-CM | POA: Diagnosis not present

## 2022-11-16 DIAGNOSIS — F0283 Dementia in other diseases classified elsewhere, unspecified severity, with mood disturbance: Secondary | ICD-10-CM | POA: Diagnosis not present

## 2022-11-16 DIAGNOSIS — N390 Urinary tract infection, site not specified: Secondary | ICD-10-CM | POA: Diagnosis not present

## 2022-11-16 DIAGNOSIS — D509 Iron deficiency anemia, unspecified: Secondary | ICD-10-CM | POA: Diagnosis not present

## 2022-11-16 DIAGNOSIS — E890 Postprocedural hypothyroidism: Secondary | ICD-10-CM | POA: Diagnosis not present

## 2022-11-16 DIAGNOSIS — I10 Essential (primary) hypertension: Secondary | ICD-10-CM | POA: Diagnosis not present

## 2022-11-16 DIAGNOSIS — M1991 Primary osteoarthritis, unspecified site: Secondary | ICD-10-CM | POA: Diagnosis not present

## 2022-11-16 DIAGNOSIS — N200 Calculus of kidney: Secondary | ICD-10-CM | POA: Diagnosis not present

## 2022-11-16 DIAGNOSIS — J439 Emphysema, unspecified: Secondary | ICD-10-CM | POA: Diagnosis not present

## 2022-11-18 DIAGNOSIS — F03A0 Unspecified dementia, mild, without behavioral disturbance, psychotic disturbance, mood disturbance, and anxiety: Secondary | ICD-10-CM | POA: Diagnosis not present

## 2022-11-18 DIAGNOSIS — N390 Urinary tract infection, site not specified: Secondary | ICD-10-CM | POA: Diagnosis not present

## 2022-11-19 DIAGNOSIS — R296 Repeated falls: Secondary | ICD-10-CM | POA: Diagnosis not present

## 2022-11-19 DIAGNOSIS — M1991 Primary osteoarthritis, unspecified site: Secondary | ICD-10-CM | POA: Diagnosis not present

## 2022-11-19 DIAGNOSIS — E559 Vitamin D deficiency, unspecified: Secondary | ICD-10-CM | POA: Diagnosis not present

## 2022-11-25 DIAGNOSIS — N200 Calculus of kidney: Secondary | ICD-10-CM | POA: Diagnosis not present

## 2022-11-25 DIAGNOSIS — J439 Emphysema, unspecified: Secondary | ICD-10-CM | POA: Diagnosis not present

## 2022-11-25 DIAGNOSIS — M1991 Primary osteoarthritis, unspecified site: Secondary | ICD-10-CM | POA: Diagnosis not present

## 2022-11-25 DIAGNOSIS — E890 Postprocedural hypothyroidism: Secondary | ICD-10-CM | POA: Diagnosis not present

## 2022-11-25 DIAGNOSIS — F332 Major depressive disorder, recurrent severe without psychotic features: Secondary | ICD-10-CM | POA: Diagnosis not present

## 2022-11-25 DIAGNOSIS — F0283 Dementia in other diseases classified elsewhere, unspecified severity, with mood disturbance: Secondary | ICD-10-CM | POA: Diagnosis not present

## 2022-11-25 DIAGNOSIS — I700 Atherosclerosis of aorta: Secondary | ICD-10-CM | POA: Diagnosis not present

## 2022-11-25 DIAGNOSIS — I10 Essential (primary) hypertension: Secondary | ICD-10-CM | POA: Diagnosis not present

## 2022-11-25 DIAGNOSIS — D509 Iron deficiency anemia, unspecified: Secondary | ICD-10-CM | POA: Diagnosis not present

## 2022-11-26 DIAGNOSIS — R7303 Prediabetes: Secondary | ICD-10-CM | POA: Diagnosis not present

## 2022-11-26 DIAGNOSIS — F0283 Dementia in other diseases classified elsewhere, unspecified severity, with mood disturbance: Secondary | ICD-10-CM | POA: Diagnosis not present

## 2022-11-26 DIAGNOSIS — G894 Chronic pain syndrome: Secondary | ICD-10-CM | POA: Diagnosis not present

## 2022-11-26 DIAGNOSIS — M5136 Other intervertebral disc degeneration, lumbar region: Secondary | ICD-10-CM | POA: Diagnosis not present

## 2022-11-26 DIAGNOSIS — G479 Sleep disorder, unspecified: Secondary | ICD-10-CM | POA: Diagnosis not present

## 2022-11-26 DIAGNOSIS — N200 Calculus of kidney: Secondary | ICD-10-CM | POA: Diagnosis not present

## 2022-11-26 DIAGNOSIS — I700 Atherosclerosis of aorta: Secondary | ICD-10-CM | POA: Diagnosis not present

## 2022-11-26 DIAGNOSIS — J439 Emphysema, unspecified: Secondary | ICD-10-CM | POA: Diagnosis not present

## 2022-11-26 DIAGNOSIS — M1991 Primary osteoarthritis, unspecified site: Secondary | ICD-10-CM | POA: Diagnosis not present

## 2022-11-26 DIAGNOSIS — F03918 Unspecified dementia, unspecified severity, with other behavioral disturbance: Secondary | ICD-10-CM | POA: Diagnosis not present

## 2022-11-26 DIAGNOSIS — F331 Major depressive disorder, recurrent, moderate: Secondary | ICD-10-CM | POA: Diagnosis not present

## 2022-11-26 DIAGNOSIS — R451 Restlessness and agitation: Secondary | ICD-10-CM | POA: Diagnosis not present

## 2022-11-26 DIAGNOSIS — F59 Unspecified behavioral syndromes associated with physiological disturbances and physical factors: Secondary | ICD-10-CM | POA: Diagnosis not present

## 2022-11-26 DIAGNOSIS — F5102 Adjustment insomnia: Secondary | ICD-10-CM | POA: Diagnosis not present

## 2022-11-26 DIAGNOSIS — F332 Major depressive disorder, recurrent severe without psychotic features: Secondary | ICD-10-CM | POA: Diagnosis not present

## 2022-11-26 DIAGNOSIS — E890 Postprocedural hypothyroidism: Secondary | ICD-10-CM | POA: Diagnosis not present

## 2022-11-26 DIAGNOSIS — I10 Essential (primary) hypertension: Secondary | ICD-10-CM | POA: Diagnosis not present

## 2022-11-26 DIAGNOSIS — D509 Iron deficiency anemia, unspecified: Secondary | ICD-10-CM | POA: Diagnosis not present

## 2022-11-30 DIAGNOSIS — R946 Abnormal results of thyroid function studies: Secondary | ICD-10-CM | POA: Diagnosis not present

## 2022-12-02 DIAGNOSIS — F59 Unspecified behavioral syndromes associated with physiological disturbances and physical factors: Secondary | ICD-10-CM | POA: Diagnosis not present

## 2022-12-02 DIAGNOSIS — R451 Restlessness and agitation: Secondary | ICD-10-CM | POA: Diagnosis not present

## 2022-12-02 DIAGNOSIS — F03918 Unspecified dementia, unspecified severity, with other behavioral disturbance: Secondary | ICD-10-CM | POA: Diagnosis not present

## 2022-12-02 DIAGNOSIS — F331 Major depressive disorder, recurrent, moderate: Secondary | ICD-10-CM | POA: Diagnosis not present

## 2022-12-02 DIAGNOSIS — F5102 Adjustment insomnia: Secondary | ICD-10-CM | POA: Diagnosis not present

## 2022-12-03 DIAGNOSIS — R946 Abnormal results of thyroid function studies: Secondary | ICD-10-CM | POA: Diagnosis not present

## 2022-12-07 DIAGNOSIS — I700 Atherosclerosis of aorta: Secondary | ICD-10-CM | POA: Diagnosis not present

## 2022-12-07 DIAGNOSIS — F0283 Dementia in other diseases classified elsewhere, unspecified severity, with mood disturbance: Secondary | ICD-10-CM | POA: Diagnosis not present

## 2022-12-07 DIAGNOSIS — F332 Major depressive disorder, recurrent severe without psychotic features: Secondary | ICD-10-CM | POA: Diagnosis not present

## 2022-12-07 DIAGNOSIS — J439 Emphysema, unspecified: Secondary | ICD-10-CM | POA: Diagnosis not present

## 2022-12-07 DIAGNOSIS — I10 Essential (primary) hypertension: Secondary | ICD-10-CM | POA: Diagnosis not present

## 2022-12-07 DIAGNOSIS — N200 Calculus of kidney: Secondary | ICD-10-CM | POA: Diagnosis not present

## 2022-12-07 DIAGNOSIS — D509 Iron deficiency anemia, unspecified: Secondary | ICD-10-CM | POA: Diagnosis not present

## 2022-12-07 DIAGNOSIS — E890 Postprocedural hypothyroidism: Secondary | ICD-10-CM | POA: Diagnosis not present

## 2022-12-07 DIAGNOSIS — M1991 Primary osteoarthritis, unspecified site: Secondary | ICD-10-CM | POA: Diagnosis not present

## 2022-12-09 DIAGNOSIS — R946 Abnormal results of thyroid function studies: Secondary | ICD-10-CM | POA: Diagnosis not present

## 2022-12-14 DIAGNOSIS — I10 Essential (primary) hypertension: Secondary | ICD-10-CM | POA: Diagnosis not present

## 2022-12-14 DIAGNOSIS — L89301 Pressure ulcer of unspecified buttock, stage 1: Secondary | ICD-10-CM | POA: Diagnosis not present

## 2022-12-16 DIAGNOSIS — E890 Postprocedural hypothyroidism: Secondary | ICD-10-CM | POA: Diagnosis not present

## 2022-12-16 DIAGNOSIS — N200 Calculus of kidney: Secondary | ICD-10-CM | POA: Diagnosis not present

## 2022-12-16 DIAGNOSIS — M1991 Primary osteoarthritis, unspecified site: Secondary | ICD-10-CM | POA: Diagnosis not present

## 2022-12-16 DIAGNOSIS — J439 Emphysema, unspecified: Secondary | ICD-10-CM | POA: Diagnosis not present

## 2022-12-16 DIAGNOSIS — I700 Atherosclerosis of aorta: Secondary | ICD-10-CM | POA: Diagnosis not present

## 2022-12-16 DIAGNOSIS — F0283 Dementia in other diseases classified elsewhere, unspecified severity, with mood disturbance: Secondary | ICD-10-CM | POA: Diagnosis not present

## 2022-12-16 DIAGNOSIS — D509 Iron deficiency anemia, unspecified: Secondary | ICD-10-CM | POA: Diagnosis not present

## 2022-12-16 DIAGNOSIS — F332 Major depressive disorder, recurrent severe without psychotic features: Secondary | ICD-10-CM | POA: Diagnosis not present

## 2022-12-16 DIAGNOSIS — I10 Essential (primary) hypertension: Secondary | ICD-10-CM | POA: Diagnosis not present

## 2022-12-22 DIAGNOSIS — E890 Postprocedural hypothyroidism: Secondary | ICD-10-CM | POA: Diagnosis not present

## 2022-12-22 DIAGNOSIS — F332 Major depressive disorder, recurrent severe without psychotic features: Secondary | ICD-10-CM | POA: Diagnosis not present

## 2022-12-22 DIAGNOSIS — F0283 Dementia in other diseases classified elsewhere, unspecified severity, with mood disturbance: Secondary | ICD-10-CM | POA: Diagnosis not present

## 2022-12-22 DIAGNOSIS — M1991 Primary osteoarthritis, unspecified site: Secondary | ICD-10-CM | POA: Diagnosis not present

## 2022-12-22 DIAGNOSIS — I700 Atherosclerosis of aorta: Secondary | ICD-10-CM | POA: Diagnosis not present

## 2022-12-22 DIAGNOSIS — D509 Iron deficiency anemia, unspecified: Secondary | ICD-10-CM | POA: Diagnosis not present

## 2022-12-22 DIAGNOSIS — I10 Essential (primary) hypertension: Secondary | ICD-10-CM | POA: Diagnosis not present

## 2022-12-22 DIAGNOSIS — N200 Calculus of kidney: Secondary | ICD-10-CM | POA: Diagnosis not present

## 2022-12-22 DIAGNOSIS — J439 Emphysema, unspecified: Secondary | ICD-10-CM | POA: Diagnosis not present

## 2022-12-24 DIAGNOSIS — R451 Restlessness and agitation: Secondary | ICD-10-CM | POA: Diagnosis not present

## 2022-12-24 DIAGNOSIS — F59 Unspecified behavioral syndromes associated with physiological disturbances and physical factors: Secondary | ICD-10-CM | POA: Diagnosis not present

## 2022-12-24 DIAGNOSIS — F331 Major depressive disorder, recurrent, moderate: Secondary | ICD-10-CM | POA: Diagnosis not present

## 2022-12-24 DIAGNOSIS — F5102 Adjustment insomnia: Secondary | ICD-10-CM | POA: Diagnosis not present

## 2022-12-24 DIAGNOSIS — F03918 Unspecified dementia, unspecified severity, with other behavioral disturbance: Secondary | ICD-10-CM | POA: Diagnosis not present

## 2022-12-24 DIAGNOSIS — F419 Anxiety disorder, unspecified: Secondary | ICD-10-CM | POA: Diagnosis not present

## 2022-12-31 DIAGNOSIS — I10 Essential (primary) hypertension: Secondary | ICD-10-CM | POA: Diagnosis not present

## 2022-12-31 DIAGNOSIS — R4182 Altered mental status, unspecified: Secondary | ICD-10-CM | POA: Diagnosis not present

## 2023-01-01 DIAGNOSIS — F331 Major depressive disorder, recurrent, moderate: Secondary | ICD-10-CM | POA: Diagnosis not present

## 2023-01-01 DIAGNOSIS — F03918 Unspecified dementia, unspecified severity, with other behavioral disturbance: Secondary | ICD-10-CM | POA: Diagnosis not present

## 2023-01-01 DIAGNOSIS — F419 Anxiety disorder, unspecified: Secondary | ICD-10-CM | POA: Diagnosis not present

## 2023-01-01 DIAGNOSIS — R451 Restlessness and agitation: Secondary | ICD-10-CM | POA: Diagnosis not present

## 2023-01-01 DIAGNOSIS — R4182 Altered mental status, unspecified: Secondary | ICD-10-CM | POA: Diagnosis not present

## 2023-01-01 DIAGNOSIS — F59 Unspecified behavioral syndromes associated with physiological disturbances and physical factors: Secondary | ICD-10-CM | POA: Diagnosis not present

## 2023-01-01 DIAGNOSIS — F5102 Adjustment insomnia: Secondary | ICD-10-CM | POA: Diagnosis not present

## 2023-01-02 ENCOUNTER — Emergency Department (HOSPITAL_COMMUNITY): Payer: Medicare PPO

## 2023-01-02 ENCOUNTER — Encounter (HOSPITAL_COMMUNITY): Payer: Self-pay | Admitting: Pharmacy Technician

## 2023-01-02 ENCOUNTER — Inpatient Hospital Stay (HOSPITAL_COMMUNITY)
Admission: EM | Admit: 2023-01-02 | Discharge: 2023-01-08 | DRG: 309 | Disposition: A | Discharge status: Home / Self Care | Payer: Medicare PPO | Source: Skilled Nursing Facility | Attending: Internal Medicine | Admitting: Internal Medicine

## 2023-01-02 ENCOUNTER — Other Ambulatory Visit: Payer: Self-pay

## 2023-01-02 DIAGNOSIS — W19XXXA Unspecified fall, initial encounter: Secondary | ICD-10-CM | POA: Diagnosis present

## 2023-01-02 DIAGNOSIS — F0390 Unspecified dementia without behavioral disturbance: Secondary | ICD-10-CM | POA: Diagnosis present

## 2023-01-02 DIAGNOSIS — H3530 Unspecified macular degeneration: Secondary | ICD-10-CM | POA: Diagnosis present

## 2023-01-02 DIAGNOSIS — Z6826 Body mass index (BMI) 26.0-26.9, adult: Secondary | ICD-10-CM

## 2023-01-02 DIAGNOSIS — S0083XA Contusion of other part of head, initial encounter: Secondary | ICD-10-CM | POA: Diagnosis present

## 2023-01-02 DIAGNOSIS — I10 Essential (primary) hypertension: Secondary | ICD-10-CM | POA: Diagnosis present

## 2023-01-02 DIAGNOSIS — E890 Postprocedural hypothyroidism: Secondary | ICD-10-CM | POA: Diagnosis present

## 2023-01-02 DIAGNOSIS — Z880 Allergy status to penicillin: Secondary | ICD-10-CM | POA: Diagnosis not present

## 2023-01-02 DIAGNOSIS — F03C3 Unspecified dementia, severe, with mood disturbance: Secondary | ICD-10-CM | POA: Diagnosis present

## 2023-01-02 DIAGNOSIS — F03C11 Unspecified dementia, severe, with agitation: Secondary | ICD-10-CM | POA: Diagnosis present

## 2023-01-02 DIAGNOSIS — F02818 Dementia in other diseases classified elsewhere, unspecified severity, with other behavioral disturbance: Secondary | ICD-10-CM | POA: Diagnosis not present

## 2023-01-02 DIAGNOSIS — Z7401 Bed confinement status: Secondary | ICD-10-CM | POA: Diagnosis not present

## 2023-01-02 DIAGNOSIS — Z887 Allergy status to serum and vaccine status: Secondary | ICD-10-CM

## 2023-01-02 DIAGNOSIS — I959 Hypotension, unspecified: Secondary | ICD-10-CM | POA: Diagnosis not present

## 2023-01-02 DIAGNOSIS — Z85850 Personal history of malignant neoplasm of thyroid: Secondary | ICD-10-CM

## 2023-01-02 DIAGNOSIS — Z8249 Family history of ischemic heart disease and other diseases of the circulatory system: Secondary | ICD-10-CM | POA: Diagnosis not present

## 2023-01-02 DIAGNOSIS — D649 Anemia, unspecified: Secondary | ICD-10-CM | POA: Diagnosis present

## 2023-01-02 DIAGNOSIS — Z96642 Presence of left artificial hip joint: Secondary | ICD-10-CM | POA: Diagnosis present

## 2023-01-02 DIAGNOSIS — R001 Bradycardia, unspecified: Secondary | ICD-10-CM | POA: Diagnosis not present

## 2023-01-02 DIAGNOSIS — R0609 Other forms of dyspnea: Secondary | ICD-10-CM | POA: Diagnosis not present

## 2023-01-02 DIAGNOSIS — I459 Conduction disorder, unspecified: Secondary | ICD-10-CM | POA: Diagnosis present

## 2023-01-02 DIAGNOSIS — R531 Weakness: Secondary | ICD-10-CM | POA: Diagnosis not present

## 2023-01-02 DIAGNOSIS — J302 Other seasonal allergic rhinitis: Secondary | ICD-10-CM | POA: Diagnosis present

## 2023-01-02 DIAGNOSIS — Z515 Encounter for palliative care: Secondary | ICD-10-CM | POA: Diagnosis not present

## 2023-01-02 DIAGNOSIS — I452 Bifascicular block: Secondary | ICD-10-CM | POA: Diagnosis present

## 2023-01-02 DIAGNOSIS — Z1152 Encounter for screening for COVID-19: Secondary | ICD-10-CM

## 2023-01-02 DIAGNOSIS — F03911 Unspecified dementia, unspecified severity, with agitation: Secondary | ICD-10-CM | POA: Diagnosis not present

## 2023-01-02 DIAGNOSIS — Z7189 Other specified counseling: Secondary | ICD-10-CM | POA: Diagnosis not present

## 2023-01-02 DIAGNOSIS — F03B4 Unspecified dementia, moderate, with anxiety: Secondary | ICD-10-CM | POA: Diagnosis not present

## 2023-01-02 DIAGNOSIS — Z66 Do not resuscitate: Secondary | ICD-10-CM | POA: Diagnosis present

## 2023-01-02 DIAGNOSIS — Z79890 Hormone replacement therapy (postmenopausal): Secondary | ICD-10-CM | POA: Diagnosis not present

## 2023-01-02 DIAGNOSIS — H9193 Unspecified hearing loss, bilateral: Secondary | ICD-10-CM | POA: Diagnosis present

## 2023-01-02 DIAGNOSIS — Z87891 Personal history of nicotine dependence: Secondary | ICD-10-CM

## 2023-01-02 DIAGNOSIS — Z79899 Other long term (current) drug therapy: Secondary | ICD-10-CM

## 2023-01-02 DIAGNOSIS — R413 Other amnesia: Secondary | ICD-10-CM | POA: Diagnosis present

## 2023-01-02 DIAGNOSIS — F32A Depression, unspecified: Secondary | ICD-10-CM | POA: Diagnosis present

## 2023-01-02 DIAGNOSIS — Z888 Allergy status to other drugs, medicaments and biological substances status: Secondary | ICD-10-CM

## 2023-01-02 DIAGNOSIS — E663 Overweight: Secondary | ICD-10-CM | POA: Diagnosis present

## 2023-01-02 DIAGNOSIS — F03C4 Unspecified dementia, severe, with anxiety: Secondary | ICD-10-CM | POA: Diagnosis present

## 2023-01-02 DIAGNOSIS — E039 Hypothyroidism, unspecified: Secondary | ICD-10-CM | POA: Diagnosis not present

## 2023-01-02 DIAGNOSIS — I442 Atrioventricular block, complete: Principal | ICD-10-CM | POA: Diagnosis present

## 2023-01-02 DIAGNOSIS — J439 Emphysema, unspecified: Secondary | ICD-10-CM | POA: Diagnosis present

## 2023-01-02 DIAGNOSIS — Z7982 Long term (current) use of aspirin: Secondary | ICD-10-CM

## 2023-01-02 DIAGNOSIS — Z8619 Personal history of other infectious and parasitic diseases: Secondary | ICD-10-CM

## 2023-01-02 DIAGNOSIS — Z87442 Personal history of urinary calculi: Secondary | ICD-10-CM

## 2023-01-02 DIAGNOSIS — G309 Alzheimer's disease, unspecified: Secondary | ICD-10-CM | POA: Diagnosis not present

## 2023-01-02 LAB — CREATININE, SERUM
Creatinine, Ser: 0.99 mg/dL (ref 0.61–1.24)
GFR, Estimated: >60 mL/min (ref >60)

## 2023-01-02 LAB — BASIC METABOLIC PANEL
Anion gap: 10 (ref 5–15)
BUN: 19 mg/dL (ref 8–23)
CO2: 24 mmol/L (ref 22–32)
Calcium: 8.5 mg/dL — ABNORMAL LOW (ref 8.9–10.3)
Chloride: 106 mmol/L (ref 98–111)
Creatinine, Ser: 1.03 mg/dL (ref 0.61–1.24)
GFR, Estimated: >60 mL/min (ref >60)
Glucose, Bld: 104 mg/dL — ABNORMAL HIGH (ref 70–99)
Potassium: 4.1 mmol/L (ref 3.5–5.1)
Sodium: 140 mmol/L (ref 135–145)

## 2023-01-02 LAB — CBC
HCT: 31.6 % — ABNORMAL LOW (ref 39.0–52.0)
HCT: 31.7 % — ABNORMAL LOW (ref 39.0–52.0)
Hemoglobin: 10.2 g/dL — ABNORMAL LOW (ref 13.0–17.0)
Hemoglobin: 10.5 g/dL — ABNORMAL LOW (ref 13.0–17.0)
MCH: 28.9 pg (ref 26.0–34.0)
MCH: 29.6 pg (ref 26.0–34.0)
MCHC: 32.3 g/dL (ref 30.0–36.0)
MCHC: 33.1 g/dL (ref 30.0–36.0)
MCV: 89.5 fL (ref 80.0–100.0)
MCV: 89.3 fL (ref 80.0–100.0)
Platelets: 196 10*3/uL (ref 150–400)
Platelets: 193 10*3/uL (ref 150–400)
RBC: 3.53 MIL/uL — ABNORMAL LOW (ref 4.22–5.81)
RBC: 3.55 MIL/uL — ABNORMAL LOW (ref 4.22–5.81)
RDW: 13.5 % (ref 11.5–15.5)
RDW: 13.3 % (ref 11.5–15.5)
WBC: 5.7 10*3/uL (ref 4.0–10.5)
WBC: 5.2 10*3/uL (ref 4.0–10.5)
nRBC: 0 % (ref 0.0–0.2)
nRBC: 0 % (ref 0.0–0.2)

## 2023-01-02 LAB — MAGNESIUM: Magnesium: 2.3 mg/dL (ref 1.7–2.4)

## 2023-01-02 LAB — TSH: TSH: 0.932 u[IU]/mL (ref 0.350–4.500)

## 2023-01-02 LAB — TROPONIN I (HIGH SENSITIVITY): Troponin I (High Sensitivity): 17 ng/L (ref <18)

## 2023-01-02 MED ORDER — BUPROPION HCL ER (XL) 150 MG PO TB24
150.0000 mg | ORAL_TABLET | Freq: Every day | ORAL | Status: DC
  Administered 2023-01-03 – 2023-01-08 (×6): 150 mg via ORAL
  Filled 2023-01-02 (×6): qty 1

## 2023-01-02 MED ORDER — LORAZEPAM 0.5 MG PO TABS
0.5000 mg | ORAL_TABLET | ORAL | Status: DC | PRN
  Administered 2023-01-03 – 2023-01-07 (×9): 0.5 mg via ORAL
  Filled 2023-01-02 (×9): qty 1

## 2023-01-02 MED ORDER — OLANZAPINE 5 MG PO TABS
5.0000 mg | ORAL_TABLET | Freq: Every day | ORAL | Status: DC
  Administered 2023-01-02 – 2023-01-06 (×4): 5 mg via ORAL
  Filled 2023-01-02 (×6): qty 1

## 2023-01-02 MED ORDER — DONEPEZIL HCL 5 MG PO TABS
5.0000 mg | ORAL_TABLET | Freq: Every day | ORAL | Status: DC
  Administered 2023-01-02: 5 mg via ORAL
  Filled 2023-01-02: qty 1

## 2023-01-02 MED ORDER — SERTRALINE HCL 100 MG PO TABS
200.0000 mg | ORAL_TABLET | Freq: Every day | ORAL | Status: DC
  Administered 2023-01-03: 200 mg via ORAL
  Filled 2023-01-02: qty 2

## 2023-01-02 MED ORDER — ASPIRIN 81 MG PO TBEC
81.0000 mg | DELAYED_RELEASE_TABLET | Freq: Every day | ORAL | Status: DC
  Administered 2023-01-03 – 2023-01-08 (×6): 81 mg via ORAL
  Filled 2023-01-02 (×6): qty 1

## 2023-01-02 MED ORDER — ENOXAPARIN SODIUM 40 MG/0.4ML IJ SOSY
40.0000 mg | PREFILLED_SYRINGE | INTRAMUSCULAR | Status: DC
  Administered 2023-01-02: 40 mg via SUBCUTANEOUS
  Filled 2023-01-02: qty 0.4

## 2023-01-02 MED ORDER — DOCUSATE SODIUM 100 MG PO CAPS
100.0000 mg | ORAL_CAPSULE | Freq: Two times a day (BID) | ORAL | Status: DC
  Administered 2023-01-02 – 2023-01-08 (×11): 100 mg via ORAL
  Filled 2023-01-02 (×11): qty 1

## 2023-01-02 MED ORDER — MODAFINIL 100 MG PO TABS
200.0000 mg | ORAL_TABLET | Freq: Every day | ORAL | Status: DC
  Administered 2023-01-03 – 2023-01-07 (×5): 200 mg via ORAL
  Filled 2023-01-02 (×6): qty 2

## 2023-01-02 MED ORDER — ONDANSETRON HCL 4 MG/2ML IJ SOLN: 4.0000 mg | Freq: Four times a day (QID) | INTRAMUSCULAR | Status: DC | PRN

## 2023-01-02 MED ORDER — LEVOTHYROXINE SODIUM 25 MCG PO TABS
125.0000 ug | ORAL_TABLET | Freq: Every day | ORAL | Status: DC
  Administered 2023-01-04 – 2023-01-08 (×3): 125 ug via ORAL
  Filled 2023-01-02 (×4): qty 1

## 2023-01-02 MED ORDER — TRAZODONE HCL 100 MG PO TABS: 100.0000 mg | ORAL_TABLET | Freq: Every evening | ORAL | Status: DC | PRN

## 2023-01-02 MED ORDER — ONDANSETRON HCL 4 MG PO TABS: 4.0000 mg | ORAL_TABLET | Freq: Four times a day (QID) | ORAL | Status: DC | PRN

## 2023-01-02 MED ORDER — POLYETHYLENE GLYCOL 3350 17 G PO PACK: 17.0000 g | PACK | Freq: Every day | ORAL | Status: DC | PRN

## 2023-01-02 MED ORDER — ACETAMINOPHEN 650 MG RE SUPP: 650.0000 mg | Freq: Four times a day (QID) | RECTAL | Status: DC | PRN

## 2023-01-02 MED ORDER — ACETAMINOPHEN 325 MG PO TABS
650.0000 mg | ORAL_TABLET | Freq: Four times a day (QID) | ORAL | Status: DC | PRN
  Administered 2023-01-04 – 2023-01-05 (×3): 650 mg via ORAL
  Filled 2023-01-02 (×3): qty 2

## 2023-01-02 NOTE — ED Triage Notes (Addendum)
Pt bib ems from [facility] with fall. Pt with hematoma to R forehead. Pt with hx dementia. EMS reports fall mechanical in nature, unsure if witnessed. Pt did not tolerate ccollar. No other injuries noted. 106/70 HR 38 RR 16 98%

## 2023-01-02 NOTE — H&P (Signed)
History and Physical  Jay Webster UJW:[id number] DOB: 1932/07/15 DOA: 01/02/2023  PCP: de [place], Raymond J, MD   Chief Complaint: Fall at nursing home  HPI: Jay Webster is a 89 y.o. male with medical history significant for advanced dementia, right bundle branch block, hypertension, hypothyroidism on Synthroid be admitted to the hospital with complete heart block.  History is very limited due to the patient's advanced dementia, he was brought from the nursing home due to a syncope event, or fall.  Has obvious contusion to his head.  In the emergency department, head CT was done and unremarkable.  He was noted to be bradycardic, EKG confirmed complete heart block.he seems to be asymptomatic from this, and hemodynamically stable.  Lab work is unremarkable as well.  ER provider discussed with cardiology, who recommends admission on the hospitalist service to [facility], for EP evaluation in the morning.  Review of Systems:  Review of systems could not be performed due to the patient's advanced dementia.  Past Medical History:  Diagnosis Date   Arthritis    OA AND PAIN LEFT HIP AND OTHER JOINT PAINS   Cancer (HCC)    THYROID CANCER - HAD THYROID REMOVED   History of kidney stones    History of shingles    RESOLVED   Hypertension    Hypothyroidism    Low back pain    Macular degeneration of both eyes    RBBB (right bundle branch block with left anterior fascicular block)    Seasonal allergies    Thyroid disease    Uric acid renal calculus 06/20/2007   Qualifier: Diagnosis of  By: Claiborne Billings CMA, Terance Ice     Past Surgical History:  Procedure Laterality Date   APPENDECTOMY  1948   CATARACT EXTRACTION Right 08/27/2019   COLONOSCOPY     Elbow Radical Reduction  1973   IR IMAGING GUIDED PORT INSERTION  05/05/2018   IR REMOVAL TUN ACCESS W/ PORT W/O FL MOD SED  10/18/2018   KIDNEY STONE SURGERY  1971, 1976   MASS EXCISION N/A 03/11/2018   Procedure: EXCISION MASS OF  PERINEUM;  Surgeon: Darnell Level, MD;  Location: [facility];  Service: General;  Laterality: N/A;   REMOVAL OF FIRST RIB   NOV 1994   FOR COMPRESSED VEIN BETWEEN 1 ST RIB AND COLLARBONE   Rib removed, 1st  1994   ROTATOR CUFF REPAIR  2009   THYROIDECTOMY  2010   TOTAL HIP ARTHROPLASTY Left 05/31/2013   Procedure: LEFT TOTAL HIP ARTHROPLASTY ANTERIOR APPROACH;  Surgeon: Loanne Drilling, MD;  Location: [facility];  Service: Orthopedics;  Laterality: Left;    Social History:  reports that he quit smoking about 43 years ago. His smoking use included cigarettes. He has never used smokeless tobacco. He reports that he does not currently use alcohol. He reports that he does not use drugs.   Allergies  Allergen Reactions   Penicillins Swelling, Palpitations and Other (See Comments)    Swelling of joints. Has patient had a PCN reaction causing immediate rash, facial/tongue/throat swelling, SOB or lightheadedness with hypotension: Yes Has patient had a PCN reaction causing severe rash involving mucus membranes or skin necrosis: No Has patient had a PCN reaction that required hospitalization: Yes Has patient had a PCN reaction occurring within the last 10 years: No If all of the above answers are "NO", then may proceed with Cephalosporin use.    Shingrix [Zoster Vac Recomb Adjuvanted] Rash and Other (See  Comments)    Severe rash   Zoloft [Sertraline Hcl] Rash    Family History  Problem Relation Age of Onset   Heart disease Mother        passed in 27s   Diabetes Mother    Alcohol abuse Father        passed from this   Parkinson's disease Sister    Diabetes Brother    Dementia Sister    Cancer Brother        sounds like bone cancer- non healing knee injury     Prior to Admission medications   Medication Sig Start Date End Date Taking? Authorizing Provider  buPROPion (WELLBUTRIN XL) 150 MG 24 hr tablet Take 1 tablet (150 mg total) by mouth daily. 01/30/22   Sarina Ill, DO  donepezil  (ARICEPT) 5 MG tablet Take 1 tablet (5 mg total) by mouth at bedtime. 01/29/22   Sarina Ill, DO  levothyroxine (SYNTHROID) 125 MCG tablet TAKE 1 TABLET BY MOUTH EVERY DAY 01/24/22   de [place], Buren Kos, MD  LORazepam (ATIVAN) 0.5 MG tablet Take 1 tablet (0.5 mg total) by mouth every 4 (four) hours as needed for anxiety. 01/29/22   Sarina Ill, DO  modafinil (PROVIGIL) 200 MG tablet Take 1 tablet (200 mg total) by mouth daily. 01/30/22   Sarina Ill, DO  traZODone (DESYREL) 100 MG tablet Take 1 tablet (100 mg total) by mouth at bedtime as needed for sleep. 01/29/22   Sarina Ill, DO    Physical Exam: BP (!) 122/36   Pulse (!) 36   Temp 97.6 F (36.4 C)   Resp 10   SpO2 98%   General: Elderly male sleeping, curled up on the stretcher in the ER.  Arousable, conversant but oriented only to self. Eyes: EOMI, clear conjuctivae, white sclerea Neck: supple, no masses, trachea mildline  Cardiovascular: Bradycardic and regular, no murmurs or rubs, no peripheral edema  Respiratory: clear to auscultation bilaterally, no wheezes, no crackles  Abdomen: soft, nontender, nondistended, normal bowel tones heard  Skin: dry, no rashes  Musculoskeletal: no joint effusions, normal range of motion           Labs on Admission:  Basic Metabolic Panel: Recent Labs  Lab 01/02/23 1144 01/02/23 1147  NA 140  --   K 4.1  --   CL 106  --   CO2 24  --   GLUCOSE 104*  --   BUN 19  --   CREATININE 1.03  --   CALCIUM 8.5*  --   MG  --  2.3   Liver Function Tests: No results for input(s): "AST", "ALT", "ALKPHOS", "BILITOT", "PROT", "ALBUMIN" in the last 168 hours. No results for input(s): "LIPASE", "AMYLASE" in the last 168 hours. No results for input(s): "AMMONIA" in the last 168 hours. CBC: Recent Labs  Lab 01/02/23 1144  WBC 5.7  HGB 10.2*  HCT 31.6*  MCV 89.5  PLT 196   Cardiac Enzymes: No results for input(s): "CKTOTAL", "CKMB", "CKMBINDEX",  "TROPONINI" in the last 168 hours.  BNP (last 3 results) No results for input(s): "BNP" in the last 8760 hours.  ProBNP (last 3 results) No results for input(s): "PROBNP" in the last 8760 hours.  CBG: No results for input(s): "GLUCAP" in the last 168 hours.  Radiological Exams on Admission: CT Head Wo Contrast  Result Date: 01/02/2023 CLINICAL DATA:  Unwitnessed fall.  Forehead hematoma. EXAM: CT HEAD WITHOUT CONTRAST TECHNIQUE: Contiguous axial images were  obtained from the base of the skull through the vertex without intravenous contrast. RADIATION DOSE REDUCTION: This exam was performed according to the departmental dose-optimization program which includes automated exposure control, adjustment of the mA and/or kV according to patient size and/or use of iterative reconstruction technique. COMPARISON:  11/08/2022 FINDINGS: Brain: There is no evidence for acute hemorrhage, hydrocephalus, mass lesion, or abnormal extra-axial fluid collection. No definite CT evidence for acute infarction. Diffuse loss of parenchymal volume is consistent with atrophy. Patchy low attenuation in the deep hemispheric and periventricular white matter is nonspecific, but likely reflects chronic microvascular ischemic demyelination. Vascular: No hyperdense vessel or unexpected calcification. Skull: No evidence for fracture. No worrisome lytic or sclerotic lesion. Sinuses/Orbits: Paranasal sinuses are obscured by motion artifact but demonstrate no substantial mucosal disease or air-fluid levels. Visualized portions of the globes and intraorbital fat are unremarkable. Other: None. IMPRESSION: 1. No acute intracranial abnormality. 2. Atrophy with chronic small vessel ischemic disease. Electronically Signed   By: Kennith Center M.D.   On: 01/02/2023 12:57    Assessment/Plan Principal Problem:   Bradycardia-due to complete heart block, do not seem to be any other concerning EKG changes, and no culprit medications on his  list. --Observation admission cardiac telemetry at [facility] -Continue close observation of his cardiac rhythm, atropine if symptomatic bradycardia -Cardiology following, anticipate EP evaluation in the morning Active Problems:   Essential hypertension   Former smoker Depression-continue Zoloft, Zyprexa at bedtime Advanced dementia-continue Aricept and Zyprexa   Pulmonary emphysema, unspecified emphysema type (HCC)   Memory loss Hypothyroidism-continue Synthroid   Decreased hearing of both ears   DVT prophylaxis: Lovenox     Code Status: DNR, reviewed ACP documents including [place] DNR form.  Consults called: Cardiology  Admission status: Observation  Time spent: 42 minutes  Sherrill Mckamie Sharlette Dense MD [facility] Pager [phone]  If 7PM-7AM, please contact night-coverage [email or web address] Password [facility]  01/02/2023, 1:35 PM

## 2023-01-02 NOTE — Consult Note (Signed)
Cardiology Consultation   Patient ID: Jay Webster MRN: [id number]; DOB: 06-14-1932  Admit date: 01/02/2023 Date of Consult: 01/02/2023  PCP:  de [place], Buren Kos, MD   [facility] Providers Cardiologist:  None        Patient Profile:   Jay Webster is a 89 y.o. male with a hx of OA, thyroid cancer s/p thyroidectomy, hypertension, RBBB, macular degeneration who is being seen 01/02/2023 for the evaluation of complete heart block at the request of Dr. Denton Lank.  History of Present Illness:   [name] presented to the [facility] ED with EMS from SNF after a fall with hematoma to forehead. Limited history secondary to dementia. In the ED, patient with no acute intracranial abnormality on CT. ECG noted complete heart block with wide QRS morphology, rate ~39 BPM. Labs unremarkable for electrolyte derangement. CBC with HGB 10.2. TSH 0.932.   Patient seen at bedside, very pleasant gentleman, reports he fell and hit his head and has laceration.  Cannot give more history, no prior cardiac history that we know of, has right bundle branch block. Currently the his escape rhythm is good heart rates in 3540s, blood pressure is good, mentating well, no indication for urgent temporary pacemaker or temporary wire  EKG shows complete heart block with right bundle branch morphology escape rhythm.   Past Medical History:  Diagnosis Date   Arthritis    OA AND PAIN LEFT HIP AND OTHER JOINT PAINS   Cancer (HCC)    THYROID CANCER - HAD THYROID REMOVED   History of kidney stones    History of shingles    RESOLVED   Hypertension    Hypothyroidism    Low back pain    Macular degeneration of both eyes    RBBB (right bundle branch block with left anterior fascicular block)    Seasonal allergies    Thyroid disease    Uric acid renal calculus 06/20/2007   Qualifier: Diagnosis of  By: Claiborne Billings CMA, Terance Ice      Past Surgical History:  Procedure Laterality Date   APPENDECTOMY  1948    CATARACT EXTRACTION Right 08/27/2019   COLONOSCOPY     Elbow Radical Reduction  1973   IR IMAGING GUIDED PORT INSERTION  05/05/2018   IR REMOVAL TUN ACCESS W/ PORT W/O FL MOD SED  10/18/2018   KIDNEY STONE SURGERY  1971, 1976   MASS EXCISION N/A 03/11/2018   Procedure: EXCISION MASS OF PERINEUM;  Surgeon: Darnell Level, MD;  Location: [facility];  Service: General;  Laterality: N/A;   REMOVAL OF FIRST RIB   NOV 1994   FOR COMPRESSED VEIN BETWEEN 1 ST RIB AND COLLARBONE   Rib removed, 1st  1994   ROTATOR CUFF REPAIR  2009   THYROIDECTOMY  2010   TOTAL HIP ARTHROPLASTY Left 05/31/2013   Procedure: LEFT TOTAL HIP ARTHROPLASTY ANTERIOR APPROACH;  Surgeon: Loanne Drilling, MD;  Location: [facility];  Service: Orthopedics;  Laterality: Left;     Home Medications:  Prior to Admission medications   Medication Sig Start Date End Date Taking? Authorizing Provider  buPROPion (WELLBUTRIN XL) 150 MG 24 hr tablet Take 1 tablet (150 mg total) by mouth daily. 01/30/22   Sarina Ill, DO  donepezil (ARICEPT) 5 MG tablet Take 1 tablet (5 mg total) by mouth at bedtime. 01/29/22   Sarina Ill, DO  levothyroxine (SYNTHROID) 125 MCG tablet TAKE 1 TABLET BY MOUTH EVERY DAY 01/24/22   de  [place], Raymond J, MD  LORazepam (ATIVAN) 0.5 MG tablet Take 1 tablet (0.5 mg total) by mouth every 4 (four) hours as needed for anxiety. 01/29/22   Sarina Ill, DO  modafinil (PROVIGIL) 200 MG tablet Take 1 tablet (200 mg total) by mouth daily. 01/30/22   Sarina Ill, DO  traZODone (DESYREL) 100 MG tablet Take 1 tablet (100 mg total) by mouth at bedtime as needed for sleep. 01/29/22   Sarina Ill, DO    Inpatient Medications: Scheduled Meds:  Continuous Infusions:  PRN Meds:   Allergies:    Allergies  Allergen Reactions   Penicillins Swelling, Palpitations and Other (See Comments)    Swelling of joints. Has patient had a PCN reaction causing immediate rash, facial/tongue/throat  swelling, SOB or lightheadedness with hypotension: Yes Has patient had a PCN reaction causing severe rash involving mucus membranes or skin necrosis: No Has patient had a PCN reaction that required hospitalization: Yes Has patient had a PCN reaction occurring within the last 10 years: No If all of the above answers are "NO", then may proceed with Cephalosporin use.    Shingrix [Zoster Vac Recomb Adjuvanted] Rash and Other (See Comments)    Severe rash   Zoloft [Sertraline Hcl] Rash    Social History:   Social History   Socioeconomic History   Marital status: Widowed    Spouse name: Not on file   Number of children: Not on file   Years of education: Not on file   Highest education level: Not on file  Occupational History   Occupation: retired  Tobacco Use   Smoking status: Former    Types: Cigarettes    Quit date: 02/22/1979    Years since quitting: 43.8   Smokeless tobacco: Never  Vaping Use   Vaping Use: Never used  Substance and Sexual Activity   Alcohol use: Not Currently    Alcohol/week: 0.0 - 1.0 standard drinks of alcohol   Drug use: No   Sexual activity: Not Currently  Other Topics Concern   Not on file  Social History Narrative   Lives alone and 1 cat. Divorced from mongomous long term partner and eventual husband. No children from prior relationships.       Retired Social worker: socializing, used to play tennis   Social Determinants of Corporate investment banker Strain: Low Risk  (05/27/2020)   Overall Financial Resource Strain (CARDIA)    Difficulty of Paying Living Expenses: Not hard at all  Food Insecurity: No Food Insecurity (05/27/2020)   Hunger Vital Sign    Worried About Running Out of Food in the Last Year: Never true    Ran Out of Food in the Last Year: Never true  Transportation Needs: No Transportation Needs (05/27/2020)   PRAPARE - Administrator, Civil Service (Medical): No    Lack of Transportation  (Non-Medical): No  Physical Activity: Inactive (05/27/2020)   Exercise Vital Sign    Days of Exercise per Week: 0 days    Minutes of Exercise per Session: 0 min  Stress: No Stress Concern Present (05/27/2020)   [employer] of Occupational Health - Occupational Stress Questionnaire    Feeling of Stress : Only a little  Social Connections: Socially Isolated (05/27/2020)   Social Connection and Isolation Panel [NHANES]    Frequency of Communication with Friends and Family: Once a week    Frequency of Social Gatherings with Friends and Family: Twice a  week    Attends Religious Services: Never    Active Member of Clubs or Organizations: No    Attends Banker Meetings: Never    Marital Status: Widowed  Intimate Partner Violence: Not At Risk (05/27/2020)   Humiliation, Afraid, Rape, and Kick questionnaire    Fear of Current or Ex-Partner: No    Emotionally Abused: No    Physically Abused: No    Sexually Abused: No    Family History:    Family History  Problem Relation Age of Onset   Heart disease Mother        passed in 39s   Diabetes Mother    Alcohol abuse Father        passed from this   Parkinson's disease Sister    Diabetes Brother    Dementia Sister    Cancer Brother        sounds like bone cancer- non healing knee injury     ROS:  Please see the history of present illness.   All other ROS reviewed and negative.     Physical Exam/Data:   Vitals:   01/02/23 1143 01/02/23 1145 01/02/23 1200 01/02/23 1245  BP: (!) 125/59 107/67 (!) 140/52   Pulse: (!) 37 (!) 38 (!) 37 (!) 42  Resp: 12 15 11 20   Temp: 97.6 F (36.4 C)     SpO2: 97% 97% 100% 100%   No intake or output data in the 24 hours ending 01/02/23 1321    11/08/2022   11:21 AM 01/07/2022    4:03 PM 12/22/2021    4:03 PM  Last 3 Weights  Weight (lbs) 185 lb 13.6 oz  185 lb 14.4 oz  Weight (kg) 84.3 kg  84.324 kg     Information is confidential and restricted. Go to Review Flowsheets to unlock  data.     There is no height or weight on file to calculate BMI.  General:  Well nourished, well developed, in no acute distress HEENT: normal Neck: no JVD Vascular: No carotid bruits; Distal pulses 2+ bilaterally Cardiac:  normal S1, S2; RRR; no murmur  Lungs:  clear to auscultation bilaterally, no wheezing, rhonchi or rales  Abd: soft, nontender, no hepatomegaly  Ext: no edema Musculoskeletal:  No deformities, BUE and BLE strength normal and equal Skin: warm and dry  Neuro:  CNs 2-12 intact, no focal abnormalities noted Psych:  Normal affect   EKG:  The EKG was personally reviewed and demonstrates:  complete heart block    Relevant CV Studies:  No hx cardiac studies  Laboratory Data:  High Sensitivity Troponin:   Recent Labs  Lab 01/02/23 1147  TROPONINIHS 17     Chemistry Recent Labs  Lab 01/02/23 1144 01/02/23 1147  NA 140  --   K 4.1  --   CL 106  --   CO2 24  --   GLUCOSE 104*  --   BUN 19  --   CREATININE 1.03  --   CALCIUM 8.5*  --   MG  --  2.3  GFRNONAA >60  --   ANIONGAP 10  --     No results for input(s): "PROT", "ALBUMIN", "AST", "ALT", "ALKPHOS", "BILITOT" in the last 168 hours. Lipids No results for input(s): "CHOL", "TRIG", "HDL", "LABVLDL", "LDLCALC", "CHOLHDL" in the last 168 hours.  Hematology Recent Labs  Lab 01/02/23 1144  WBC 5.7  RBC 3.53*  HGB 10.2*  HCT 31.6*  MCV 89.5  MCH 28.9  MCHC 32.3  RDW 13.5  PLT 196   Thyroid  Recent Labs  Lab 01/02/23 1147  TSH 0.932    BNPNo results for input(s): "BNP", "PROBNP" in the last 168 hours.  DDimer No results for input(s): "DDIMER" in the last 168 hours.   Radiology/Studies:  CT Head Wo Contrast  Result Date: 01/02/2023 CLINICAL DATA:  Unwitnessed fall.  Forehead hematoma. EXAM: CT HEAD WITHOUT CONTRAST TECHNIQUE: Contiguous axial images were obtained from the base of the skull through the vertex without intravenous contrast. RADIATION DOSE REDUCTION: This exam was performed  according to the departmental dose-optimization program which includes automated exposure control, adjustment of the mA and/or kV according to patient size and/or use of iterative reconstruction technique. COMPARISON:  11/08/2022 FINDINGS: Brain: There is no evidence for acute hemorrhage, hydrocephalus, mass lesion, or abnormal extra-axial fluid collection. No definite CT evidence for acute infarction. Diffuse loss of parenchymal volume is consistent with atrophy. Patchy low attenuation in the deep hemispheric and periventricular white matter is nonspecific, but likely reflects chronic microvascular ischemic demyelination. Vascular: No hyperdense vessel or unexpected calcification. Skull: No evidence for fracture. No worrisome lytic or sclerotic lesion. Sinuses/Orbits: Paranasal sinuses are obscured by motion artifact but demonstrate no substantial mucosal disease or air-fluid levels. Visualized portions of the globes and intraorbital fat are unremarkable. Other: None. IMPRESSION: 1. No acute intracranial abnormality. 2. Atrophy with chronic small vessel ischemic disease. Electronically Signed   By: Kennith Center M.D.   On: 01/02/2023 12:57     Assessment and Plan:   Unwitnessed fall Complete heart block (new)  Patient brought to the ED today from SNF after a fall. Found with complete heart block/bradycardia on ED ECG. Escape rhythm stable around 38bpm.   No AV nodal agents listed in patient's admission med rec. Close observation overnight, will plan to consult EP in the morning. Anticipate need for goals of care conversation with family. Maintain K>4, Mg>2. Patient has advanced dementia, he lives in memory care, ambulation is not much, frequent falls.  Consider goals of care.  Consider getting in touch with family. Although he has memory loss he appears to be very pleasant or elderly gentleman  He is already DNR   Risk Assessment/Risk Scores:                For questions or updates,  please contact [facility] Please consult [email or web address] for contact info under    Signed, Perlie Gold, PA-C  01/02/2023 1:21 PM

## 2023-01-02 NOTE — ED Provider Notes (Signed)
[place] EMERGENCY DEPARTMENT AT [facility] Provider Note   CSN: [id number] Arrival date & time: 01/02/23  1131     History  Chief Complaint  Patient presents with   Jay Webster is a 89 y.o. male.  Pt with hx dementia, s/p from at [facility].  Pt very poor historian, dementia - level 5 caveat. No reported loc. No reported change in mental status or fevers.  EMS denies specific area of pain or deformity.   The history is provided by the patient, medical records and the EMS personnel. The history is limited by the condition of the patient.  Fall       Home Medications Prior to Admission medications   Medication Sig Start Date End Date Taking? Authorizing Provider  buPROPion (WELLBUTRIN XL) 150 MG 24 hr tablet Take 1 tablet (150 mg total) by mouth daily. 01/30/22   Sarina Ill, DO  donepezil (ARICEPT) 5 MG tablet Take 1 tablet (5 mg total) by mouth at bedtime. 01/29/22   Sarina Ill, DO  levothyroxine (SYNTHROID) 125 MCG tablet TAKE 1 TABLET BY MOUTH EVERY DAY 01/24/22   de [place], Buren Kos, MD  LORazepam (ATIVAN) 0.5 MG tablet Take 1 tablet (0.5 mg total) by mouth every 4 (four) hours as needed for anxiety. 01/29/22   Sarina Ill, DO  modafinil (PROVIGIL) 200 MG tablet Take 1 tablet (200 mg total) by mouth daily. 01/30/22   Sarina Ill, DO  traZODone (DESYREL) 100 MG tablet Take 1 tablet (100 mg total) by mouth at bedtime as needed for sleep. 01/29/22   Sarina Ill, DO      Allergies    Penicillins, Shingrix [zoster vac recomb adjuvanted], and Zoloft [sertraline hcl]    Review of Systems   Review of Systems  Unable to perform ROS: Dementia    Physical Exam Updated Vital Signs BP (!) 140/52   Pulse (!) 42   Temp 97.6 F (36.4 C)   Resp 20   SpO2 100%  Physical Exam Vitals and nursing note reviewed.  Constitutional:      Appearance: Normal appearance. He is well-developed.  HENT:     Head:      Comments: Contusion right forehead.     Nose: Nose normal.     Mouth/Throat:     Mouth: Mucous membranes are moist.     Pharynx: Oropharynx is clear.  Eyes:     General: No scleral icterus.    Conjunctiva/sclera: Conjunctivae normal.     Pupils: Pupils are equal, round, and reactive to light.  Neck:     Vascular: No carotid bruit.     Trachea: No tracheal deviation.     Comments: No stiffness or rigidity.  Cardiovascular:     Rate and Rhythm: Regular rhythm. Bradycardia present.     Pulses: Normal pulses.     Heart sounds: Normal heart sounds. No murmur heard.    No friction rub. No gallop.  Pulmonary:     Effort: Pulmonary effort is normal. No accessory muscle usage or respiratory distress.     Breath sounds: Normal breath sounds.  Chest:     Chest wall: No tenderness.  Abdominal:     General: Bowel sounds are normal. There is no distension.     Palpations: Abdomen is soft.     Tenderness: There is no abdominal tenderness. There is no guarding.     Comments: No bruising or contusion.   Genitourinary:  Comments: No cva tenderness. Musculoskeletal:        General: No swelling.     Cervical back: Normal range of motion and neck supple. No rigidity or tenderness.     Comments: CTLS spine, non tender, aligned, no step off. Good rom bilateral extremities without pain or focal bony tenderness.   Skin:    General: Skin is warm and dry.     Findings: No rash.  Neurological:     Mental Status: He is alert.     Comments: Alert, responds briefly to questions. Confusion/dementia. Motor/sens grossly intact bil.   Psychiatric:        Mood and Affect: Mood normal.     ED Results / Procedures / Treatments   Labs (all labs ordered are listed, but only abnormal results are displayed) Results for orders placed or performed during the hospital encounter of 01/02/23  Basic metabolic panel  Result Value Ref Range   Sodium 140 135 - 145 mmol/L   Potassium 4.1 3.5 - 5.1 mmol/L    Chloride 106 98 - 111 mmol/L   CO2 24 22 - 32 mmol/L   Glucose, Bld 104 (H) 70 - 99 mg/dL   BUN 19 8 - 23 mg/dL   Creatinine, Ser 6.04 0.61 - 1.24 mg/dL   Calcium 8.5 (L) 8.9 - 10.3 mg/dL   GFR, Estimated >54 >09 mL/min   Anion gap 10 5 - 15  CBC  Result Value Ref Range   WBC 5.7 4.0 - 10.5 K/uL   RBC 3.53 (L) 4.22 - 5.81 MIL/uL   Hemoglobin 10.2 (L) 13.0 - 17.0 g/dL   HCT 81.1 (L) 91.4 - 78.2 %   MCV 89.5 80.0 - 100.0 fL   MCH 28.9 26.0 - 34.0 pg   MCHC 32.3 30.0 - 36.0 g/dL   RDW 95.6 21.3 - 08.6 %   Platelets 196 150 - 400 K/uL   nRBC 0.0 0.0 - 0.2 %  Magnesium  Result Value Ref Range   Magnesium 2.3 1.7 - 2.4 mg/dL  TSH  Result Value Ref Range   TSH 0.932 0.350 - 4.500 uIU/mL  Troponin I (High Sensitivity)  Result Value Ref Range   Troponin I (High Sensitivity) 17 <18 ng/L      EKG EKG Interpretation  Date/Time:  Saturday January 02 2023 11:41:22 EDT Ventricular Rate:  39 PR Interval:    QRS Duration: 141 QT Interval:  656 QTC Calculation: 529 R Axis:   73 Text Interpretation: Complete AV block with wide QRS complex Right bundle branch block Confirmed by Cathren Laine ([id number]) on 01/02/2023 11:48:15 AM  Radiology CT Head Wo Contrast  Result Date: 01/02/2023 CLINICAL DATA:  Unwitnessed fall.  Forehead hematoma. EXAM: CT HEAD WITHOUT CONTRAST TECHNIQUE: Contiguous axial images were obtained from the base of the skull through the vertex without intravenous contrast. RADIATION DOSE REDUCTION: This exam was performed according to the departmental dose-optimization program which includes automated exposure control, adjustment of the mA and/or kV according to patient size and/or use of iterative reconstruction technique. COMPARISON:  11/08/2022 FINDINGS: Brain: There is no evidence for acute hemorrhage, hydrocephalus, mass lesion, or abnormal extra-axial fluid collection. No definite CT evidence for acute infarction. Diffuse loss of parenchymal volume is consistent with  atrophy. Patchy low attenuation in the deep hemispheric and periventricular white matter is nonspecific, but likely reflects chronic microvascular ischemic demyelination. Vascular: No hyperdense vessel or unexpected calcification. Skull: No evidence for fracture. No worrisome lytic or sclerotic lesion. Sinuses/Orbits: Paranasal sinuses are  obscured by motion artifact but demonstrate no substantial mucosal disease or air-fluid levels. Visualized portions of the globes and intraorbital fat are unremarkable. Other: None. IMPRESSION: 1. No acute intracranial abnormality. 2. Atrophy with chronic small vessel ischemic disease. Electronically Signed   By: Kennith Center M.D.   On: 01/02/2023 12:57    Procedures Procedures    Medications Ordered in ED Medications - No data to display  ED Course/ Medical Decision Making/ A&P                             Medical Decision Making Problems Addressed: Accidental fall, initial encounter: acute illness or injury with systemic symptoms that poses a threat to life or bodily functions Bradycardia: acute illness or injury with systemic symptoms that poses a threat to life or bodily functions Complete heart block (HCC): acute illness or injury with systemic symptoms that poses a threat to life or bodily functions Moderate dementia with anxiety, unspecified dementia type [facility]): chronic illness or injury that poses a threat to life or bodily functions  Amount and/or Complexity of Data Reviewed Independent Historian: EMS    Details: hx External Data Reviewed: ECG and notes. Labs: ordered. Decision-making details documented in ED Course. Radiology: ordered and independent interpretation performed. Decision-making details documented in ED Course. ECG/medicine tests: ordered and independent interpretation performed. Decision-making details documented in ED Course. Discussion of management or test interpretation with external provider(s):  Cardiology/hospitalist  Risk Prescription drug management. Decision regarding hospitalization.   Iv ns. Continuous pulse ox and cardiac monitoring. Labs ordered/sent. Imaging ordered.   Differential diagnosis includes head injury/sdh, syncope, anemia, dehydration, etc. Dispo decision including potential need for admission considered - will get labs and imaging and reassess.   Reviewed nursing notes and prior charts for additional history. External reports reviewed. Hx advanced dementia. DNR wishes and golden ticket noted. Additional history from: EMS.   Cardiac monitor: sinus rhythm, complete heart block, rate 38.  External pacer pads applied (not paced, as bp ok, mentating c/w baseline, etc.)  Labs reviewed/interpreted by me - chem normal. Wbc normal.   CT reviewed/interpreted by me - no hem.  Cardiology consulted re [facility]. Discussed pt, hx, ecg, etc - they request hospitalist admission to [facility], they will see in consult when pt arrives there, they indicate as bp/perfusion ok, no need for icu, but monitored/progressive bed.   Hospitalists consulted for admission.  CRITICAL CARE RE: fall?syncope with bradycardia/complete heart block.  Performed by: Suzi Roots Total critical care time: 45 minutes Critical care time was exclusive of separately billable procedures and treating other patients. Critical care was necessary to treat or prevent imminent or life-threatening deterioration. Critical care was time spent personally by me on the following activities: development of treatment plan with patient and/or surrogate as well as nursing, discussions with consultants, evaluation of patient's response to treatment, examination of patient, obtaining history from patient or surrogate, ordering and performing treatments and interventions, ordering and review of laboratory studies, ordering and review of radiographic studies, pulse oximetry and re-evaluation of patient's  condition.            Final Clinical Impression(s) / ED Diagnoses Final diagnoses:  None    Rx / DC Orders ED Discharge Orders     None         Cathren Laine, MD 01/02/23 1314

## 2023-01-02 NOTE — Progress Notes (Signed)
Received from [facility] ED, pt AOx1. HR on the 30's.

## 2023-01-03 DIAGNOSIS — F02818 Dementia in other diseases classified elsewhere, unspecified severity, with other behavioral disturbance: Secondary | ICD-10-CM | POA: Diagnosis not present

## 2023-01-03 DIAGNOSIS — Z515 Encounter for palliative care: Secondary | ICD-10-CM | POA: Diagnosis not present

## 2023-01-03 DIAGNOSIS — R001 Bradycardia, unspecified: Secondary | ICD-10-CM | POA: Diagnosis not present

## 2023-01-03 DIAGNOSIS — F32A Depression, unspecified: Secondary | ICD-10-CM | POA: Diagnosis present

## 2023-01-03 DIAGNOSIS — D649 Anemia, unspecified: Secondary | ICD-10-CM | POA: Diagnosis present

## 2023-01-03 DIAGNOSIS — I442 Atrioventricular block, complete: Secondary | ICD-10-CM | POA: Diagnosis present

## 2023-01-03 DIAGNOSIS — J439 Emphysema, unspecified: Secondary | ICD-10-CM | POA: Diagnosis present

## 2023-01-03 DIAGNOSIS — Z79890 Hormone replacement therapy (postmenopausal): Secondary | ICD-10-CM | POA: Diagnosis not present

## 2023-01-03 DIAGNOSIS — Z1152 Encounter for screening for COVID-19: Secondary | ICD-10-CM | POA: Diagnosis not present

## 2023-01-03 DIAGNOSIS — S0083XA Contusion of other part of head, initial encounter: Secondary | ICD-10-CM | POA: Diagnosis present

## 2023-01-03 DIAGNOSIS — G309 Alzheimer's disease, unspecified: Secondary | ICD-10-CM | POA: Diagnosis not present

## 2023-01-03 DIAGNOSIS — Z96642 Presence of left artificial hip joint: Secondary | ICD-10-CM | POA: Diagnosis present

## 2023-01-03 DIAGNOSIS — F03C11 Unspecified dementia, severe, with agitation: Secondary | ICD-10-CM | POA: Diagnosis present

## 2023-01-03 DIAGNOSIS — F03C3 Unspecified dementia, severe, with mood disturbance: Secondary | ICD-10-CM | POA: Diagnosis present

## 2023-01-03 DIAGNOSIS — J302 Other seasonal allergic rhinitis: Secondary | ICD-10-CM | POA: Diagnosis present

## 2023-01-03 DIAGNOSIS — Z7189 Other specified counseling: Secondary | ICD-10-CM | POA: Diagnosis not present

## 2023-01-03 DIAGNOSIS — Z66 Do not resuscitate: Secondary | ICD-10-CM | POA: Diagnosis present

## 2023-01-03 DIAGNOSIS — E039 Hypothyroidism, unspecified: Secondary | ICD-10-CM | POA: Diagnosis not present

## 2023-01-03 DIAGNOSIS — Z87891 Personal history of nicotine dependence: Secondary | ICD-10-CM | POA: Diagnosis not present

## 2023-01-03 DIAGNOSIS — F03C4 Unspecified dementia, severe, with anxiety: Secondary | ICD-10-CM | POA: Diagnosis present

## 2023-01-03 DIAGNOSIS — H9193 Unspecified hearing loss, bilateral: Secondary | ICD-10-CM | POA: Diagnosis present

## 2023-01-03 DIAGNOSIS — W19XXXA Unspecified fall, initial encounter: Secondary | ICD-10-CM | POA: Diagnosis present

## 2023-01-03 DIAGNOSIS — E663 Overweight: Secondary | ICD-10-CM | POA: Diagnosis present

## 2023-01-03 DIAGNOSIS — Z8249 Family history of ischemic heart disease and other diseases of the circulatory system: Secondary | ICD-10-CM | POA: Diagnosis not present

## 2023-01-03 DIAGNOSIS — F03911 Unspecified dementia, unspecified severity, with agitation: Secondary | ICD-10-CM | POA: Diagnosis not present

## 2023-01-03 DIAGNOSIS — I452 Bifascicular block: Secondary | ICD-10-CM | POA: Diagnosis present

## 2023-01-03 DIAGNOSIS — R0609 Other forms of dyspnea: Secondary | ICD-10-CM | POA: Diagnosis not present

## 2023-01-03 DIAGNOSIS — Z880 Allergy status to penicillin: Secondary | ICD-10-CM | POA: Diagnosis not present

## 2023-01-03 DIAGNOSIS — E890 Postprocedural hypothyroidism: Secondary | ICD-10-CM | POA: Diagnosis present

## 2023-01-03 DIAGNOSIS — Z85850 Personal history of malignant neoplasm of thyroid: Secondary | ICD-10-CM | POA: Diagnosis not present

## 2023-01-03 DIAGNOSIS — I10 Essential (primary) hypertension: Secondary | ICD-10-CM | POA: Diagnosis present

## 2023-01-03 DIAGNOSIS — R531 Weakness: Secondary | ICD-10-CM | POA: Diagnosis not present

## 2023-01-03 DIAGNOSIS — H3530 Unspecified macular degeneration: Secondary | ICD-10-CM | POA: Diagnosis present

## 2023-01-03 DIAGNOSIS — I459 Conduction disorder, unspecified: Secondary | ICD-10-CM | POA: Diagnosis present

## 2023-01-03 LAB — BASIC METABOLIC PANEL
Anion gap: 6 (ref 5–15)
BUN: 17 mg/dL (ref 8–23)
CO2: 25 mmol/L (ref 22–32)
Calcium: 8.2 mg/dL — ABNORMAL LOW (ref 8.9–10.3)
Chloride: 108 mmol/L (ref 98–111)
Creatinine, Ser: 1.05 mg/dL (ref 0.61–1.24)
GFR, Estimated: >60 mL/min (ref >60)
Glucose, Bld: 92 mg/dL (ref 70–99)
Potassium: 3.6 mmol/L (ref 3.5–5.1)
Sodium: 139 mmol/L (ref 135–145)

## 2023-01-03 LAB — CBC
HCT: 29.4 % — ABNORMAL LOW (ref 39.0–52.0)
Hemoglobin: 10 g/dL — ABNORMAL LOW (ref 13.0–17.0)
MCH: 29.9 pg (ref 26.0–34.0)
MCHC: 34 g/dL (ref 30.0–36.0)
MCV: 87.8 fL (ref 80.0–100.0)
Platelets: 176 10*3/uL (ref 150–400)
RBC: 3.35 MIL/uL — ABNORMAL LOW (ref 4.22–5.81)
RDW: 13.4 % (ref 11.5–15.5)
WBC: 6.1 10*3/uL (ref 4.0–10.5)
nRBC: 0 % (ref 0.0–0.2)

## 2023-01-03 MED ORDER — SERTRALINE HCL 50 MG PO TABS
150.0000 mg | ORAL_TABLET | Freq: Every day | ORAL | Status: DC
  Administered 2023-01-04 – 2023-01-08 (×5): 150 mg via ORAL
  Filled 2023-01-03 (×5): qty 1

## 2023-01-03 MED ORDER — ORAL CARE MOUTH RINSE: 15.0000 mL | OROMUCOSAL | Status: DC | PRN

## 2023-01-03 MED ORDER — ORAL CARE MOUTH RINSE
15.0000 mL | OROMUCOSAL | Status: DC
  Administered 2023-01-03 – 2023-01-06 (×9): 15 mL via OROMUCOSAL

## 2023-01-03 MED ORDER — POTASSIUM CHLORIDE CRYS ER 20 MEQ PO TBCR
40.0000 meq | EXTENDED_RELEASE_TABLET | Freq: Once | ORAL | Status: AC
  Administered 2023-01-03: 40 meq via ORAL
  Filled 2023-01-03: qty 2

## 2023-01-03 NOTE — Hospital Course (Addendum)
[name] was admitted to the hospital with the working diagnosis of bradycardia.   89 yo male with the past medical history of advanced dementia, right bundle branch block, hypertension, and hypothyroidism, who presented with syncope. He is a nursing home resident and history is limited due to advance cognitive impairment. [name] his blood pressure 122/36, HR 36, RR 10 and 02 saturation 98%, lungs with no wheezing, heart with S1 and S2 present and bradycardic with no murmurs, abdomen with no distention and no lower extremity edema.   Na 140, K 4,1 Cl 106, bicarbonate 24. Glucose 104, bun 19 cr 1,0  Mg 2,3  High sensitive troponin 17  Wbc 5,7 hgb 10,2 plt 196  TSH 0,93   Head CT with no acute changes.   EKG 39 bpm, normal axis, qtc 529, complete hear block, with no significant ST segment or T wave changes.   04/29 patient continue with bradycardia, complete heart block. Due to dementia not able to give consent for invasive interventions/ pacemaker.  EP consulted.   Patient seen in consult by EP who are waiting to have a family meeting with healthcare power of attorney on 01/06/2023.

## 2023-01-03 NOTE — Assessment & Plan Note (Signed)
Systolic blood pressure 101 to 102 mmHg.   Plan to continue to hold blood pressure medications. Continue telemetry monitoring.

## 2023-01-03 NOTE — Progress Notes (Addendum)
  Progress Note   Patient: Jay Webster ZOX:[id number] DOB: 1932/01/07 DOA: 01/02/2023     0 DOS: the patient was seen and examined on 01/03/2023   Brief hospital course: [name] was admitted to the hospital with the working diagnosis of bradycardia.   89 yo male with the past medical history of advanced dementia, right bundle branch block, hypertension, and hypothyroidism, who presented with syncope. He is a nursing home resident and history is limited due to advance cognitive impairment. [name] his blood pressure 122/36, HR 36, RR 10 and 02 saturation 98%, lungs with no wheezing, heart with S1 and S2 present and bradycardic with no murmurs, abdomen with no distention and no lower extremity edema.   Na 140, K 4,1 Cl 106, bicarbonate 24. Glucose 104, bun 19 cr 1,0  Mg 2,3  High sensitive troponin 17  Wbc 5,7 hgb 10,2 plt 196  TSH 0,93   Head CT with no acute changes.   EKG 39 bpm, normal axis, qtc 529, complete hear block, with no significant ST segment or T wave changes.     Assessment and Plan: * Bradycardia Complete heart block.  Telemetry personally reviewed, patient in persistent 3rd degree AV block with rate up to 40. Blood pressure has been stable.   Plan to continue telemetry monitoring Discontinue all possible AV blocking agents.  Out of bed to chair tid with meals Pt and OT. May need pacemaker implantation.  Check echocardiogram.  Keep K at 4 and Mg at 2.   Essential hypertension Systolic blood pressure 101 to 102 mmHg.   Plan to continue to hold blood pressure medications. Continue telemetry monitoring.   Pulmonary emphysema, unspecified emphysema type (HCC) No signs of exacerbation. Continue oxymetry monitoring.   Dementia (HCC) Anxiety and depression,  Positive decreased hearing.   Continue modafinil, bupropion and olanzapine.  Decrease sertraline 150 mg from 200 mg due to post marketing reports of bradycardia.   Lorazepam as needed every 4 hrs.   Hypothyroidism Continue with levothyroxine TSH within normal levels.         Subjective: Patient awake and alert, no chest pain or dyspnea,   Physical Exam: Vitals:   01/03/23 0400 01/03/23 0515 01/03/23 0800 01/03/23 1100  BP: (!) 115/53  (!) 105/45 (!) 107/46  Pulse: (!) 37 (!) 38 77   Resp:   15 17  Temp:  98.2 F (36.8 C) 98 F (36.7 C)   TempSrc:  Oral Axillary   SpO2: 95% 99% 100%   Weight:       Neurology awake and alert, follows commands and responds to simple questions ENT with mild pallor Cardiovascular with S1 and S2 present and bradycardic with no rubs, positive murmur at the right lower sternal border. Respiratory with no rales or wheezing Abdomen with no distention  Data Reviewed:    Family Communication: no family at the bedside   Disposition: Status is: Observation The patient remains OBS appropriate and will d/c before 2 midnights.  Planned Discharge Destination: Home  Author: Coralie Keens, MD 01/03/2023 12:42 PM  For on call review [email or web address].

## 2023-01-03 NOTE — Assessment & Plan Note (Addendum)
Anxiety and depression,  Positive decreased hearing.   Continue modafinil, bupropion and olanzapine.  Decreased sertraline to 150 mg from 200 mg due to post marketing reports of bradycardia per Dr. Ella Jubilee..  Lorazepam as needed every 4 hrs.

## 2023-01-03 NOTE — Consult Note (Signed)
ELECTROPHYSIOLOGY CONSULT NOTE    Patient ID: Jay Webster MRN: [id number], DOB/AGE: [birth year] 89 y.o.  Admit date: 01/02/2023 Date of Consult: 01/04/2023  Primary Physician: de [place], Buren Kos, MD Primary Cardiologist: None  Electrophysiologist: New   Referring Provider: Dr. Wyline Mood  Patient Profile: Jay Webster is a 89 y.o. male with a hx of OA, thyroid cancer s/p thyroidectomy, hypertension, RBBB, macular degeneration who is being seen 01/02/2023 for the evaluation of complete heart block at the request of Dr. Wyline Mood.   HPI:  Jay Webster is a 89 y.o. male with medical history as as above.   Jay Webster presented to the [facility] ED with EMS from SNF after a fall with hematoma to forehead. Limited history secondary to dementia. In the ED, patient with no acute intracranial abnormality on CT. ECG noted complete heart block with wide QRS morphology, rate ~39 BPM. Labs unremarkable for electrolyte derangement. CBC with HGB 10.2. TSH 0.932.   This am, reports being "tired" but otherwise voices no symptoms. Not sure where he is or why he is here.    Labs Potassium3.7 (04/29 0037) Magnesium  2.1 (04/29 0037) Creatinine, ser  1.12 (04/29 0037) PLT  176 (04/28 0040) HGB  10.0* (04/28 0040) WBC 6.1 (04/28 0040) Troponin I (High Sensitivity)17 (04/27 1147).    Past Medical History:  Diagnosis Date   Arthritis    OA AND PAIN LEFT HIP AND OTHER JOINT PAINS   Cancer (HCC)    THYROID CANCER - HAD THYROID REMOVED   History of kidney stones    History of shingles    RESOLVED   Hypertension    Hypothyroidism    Low back pain    Macular degeneration of both eyes    RBBB (right bundle branch block with left anterior fascicular block)    Seasonal allergies    Thyroid disease    Uric acid renal calculus 06/20/2007   Qualifier: Diagnosis of  By: Claiborne Billings CMA, Jacqualynn       Surgical History:  Past Surgical History:  Procedure Laterality Date   APPENDECTOMY  1948    CATARACT EXTRACTION Right 08/27/2019   COLONOSCOPY     Elbow Radical Reduction  1973   IR IMAGING GUIDED PORT INSERTION  05/05/2018   IR REMOVAL TUN ACCESS W/ PORT W/O FL MOD SED  10/18/2018   KIDNEY STONE SURGERY  1971, 1976   MASS EXCISION N/A 03/11/2018   Procedure: EXCISION MASS OF PERINEUM;  Surgeon: Darnell Level, MD;  Location: [facility];  Service: General;  Laterality: N/A;   REMOVAL OF FIRST RIB   NOV 1994   FOR COMPRESSED VEIN BETWEEN 1 ST RIB AND COLLARBONE   Rib removed, 1st  1994   ROTATOR CUFF REPAIR  2009   THYROIDECTOMY  2010   TOTAL HIP ARTHROPLASTY Left 05/31/2013   Procedure: LEFT TOTAL HIP ARTHROPLASTY ANTERIOR APPROACH;  Surgeon: Loanne Drilling, MD;  Location: [facility];  Service: Orthopedics;  Laterality: Left;     Medications Prior to Admission  Medication Sig Dispense Refill Last Dose   acetaminophen (TYLENOL) 325 MG tablet Take 650 mg by mouth every 6 (six) hours as needed for mild pain.   Past Month   aspirin EC 81 MG tablet Take 81 mg by mouth daily. Swallow whole.   01/02/2023   buPROPion (WELLBUTRIN XL) 150 MG 24 hr tablet Take 1 tablet (150 mg total) by mouth daily. 30 tablet 3 01/02/2023   donepezil (ARICEPT)  5 MG tablet Take 1 tablet (5 mg total) by mouth at bedtime. 30 tablet 3 01/01/2023   Emollient (AQUAPHOR OINTMENT BODY EX) Apply 1 Application topically See admin instructions. Apply topically to bilateral lower legs once daily for fry skin   Past Week   Ensure (ENSURE) Take 237 mLs by mouth daily.   01/02/2023   ferrous sulfate 325 (65 FE) MG EC tablet Take 325 mg by mouth daily with breakfast.   01/02/2023   levothyroxine (SYNTHROID) 125 MCG tablet TAKE 1 TABLET BY MOUTH EVERY DAY (Patient taking differently: Take 125 mcg by mouth daily before breakfast.) 90 tablet 1 01/02/2023   LORazepam (ATIVAN) 0.5 MG tablet Take 1 tablet (0.5 mg total) by mouth every 4 (four) hours as needed for anxiety. 30 tablet 0 Past Week   modafinil (PROVIGIL) 200 MG tablet Take 1 tablet  (200 mg total) by mouth daily. 30 tablet 3 01/02/2023   OLANZapine (ZYPREXA) 5 MG tablet Take 5 mg by mouth at bedtime.   Past Month   sertraline (ZOLOFT) 100 MG tablet Take 200 mg by mouth daily.   01/02/2023   traZODone (DESYREL) 100 MG tablet Take 1 tablet (100 mg total) by mouth at bedtime as needed for sleep. 30 tablet 3 Past Week    Inpatient Medications:   aspirin EC  81 mg Oral Daily   buPROPion  150 mg Oral Daily   docusate sodium  100 mg Oral BID   levothyroxine  125 mcg Oral Q0600   modafinil  200 mg Oral Daily   OLANZapine  5 mg Oral QHS   mouth rinse  15 mL Mouth Rinse 4 times per day   sertraline  150 mg Oral Daily    Allergies:  Allergies  Allergen Reactions   Penicillins Swelling, Palpitations and Other (See Comments)    Swelling of joints. Has patient had a PCN reaction causing immediate rash, facial/tongue/throat swelling, SOB or lightheadedness with hypotension: Yes Has patient had a PCN reaction causing severe rash involving mucus membranes or skin necrosis: No Has patient had a PCN reaction that required hospitalization: Yes Has patient had a PCN reaction occurring within the last 10 years: No If all of the above answers are "NO", then may proceed with Cephalosporin use.    Shingrix [Zoster Vac Recomb Adjuvanted] Rash and Other (See Comments)    Severe rash   Zoloft [Sertraline Hcl] Rash    Family History  Problem Relation Age of Onset   Heart disease Mother        passed in 54s   Diabetes Mother    Alcohol abuse Father        passed from this   Parkinson's disease Sister    Diabetes Brother    Dementia Sister    Cancer Brother        sounds like bone cancer- non healing knee injury     Physical Exam: Vitals:   01/03/23 2340 01/04/23 0338 01/04/23 0347 01/04/23 0759  BP: 123/63 (!) 113/56 (!) 113/56 (!) 127/51  Pulse: (!) 46 (!) 37 (!) 37 (!) 37  Resp: 17 19 19 18   Temp: 98 F (36.7 C) 97.8 F (36.6 C) 97.8 F (36.6 C) (!) 97.4 F (36.3 C)   TempSrc:  Oral  Oral  SpO2:  99%  94%  Weight:  70.3 kg      GEN- NAD, A&O x 3, normal affect HEENT: Normocephalic, atraumatic Lungs- CTAB, Normal effort.  Heart- Regular rate and rhythm, No M/G/R.  GI- Soft, NT, ND.  Extremities- No clubbing, cyanosis, or edema   Radiology/Studies: CT Head Wo Contrast  Result Date: 01/02/2023 CLINICAL DATA:  Unwitnessed fall.  Forehead hematoma. EXAM: CT HEAD WITHOUT CONTRAST TECHNIQUE: Contiguous axial images were obtained from the base of the skull through the vertex without intravenous contrast. RADIATION DOSE REDUCTION: This exam was performed according to the departmental dose-optimization program which includes automated exposure control, adjustment of the mA and/or kV according to patient size and/or use of iterative reconstruction technique. COMPARISON:  11/08/2022 FINDINGS: Brain: There is no evidence for acute hemorrhage, hydrocephalus, mass lesion, or abnormal extra-axial fluid collection. No definite CT evidence for acute infarction. Diffuse loss of parenchymal volume is consistent with atrophy. Patchy low attenuation in the deep hemispheric and periventricular white matter is nonspecific, but likely reflects chronic microvascular ischemic demyelination. Vascular: No hyperdense vessel or unexpected calcification. Skull: No evidence for fracture. No worrisome lytic or sclerotic lesion. Sinuses/Orbits: Paranasal sinuses are obscured by motion artifact but demonstrate no substantial mucosal disease or air-fluid levels. Visualized portions of the globes and intraorbital fat are unremarkable. Other: None. IMPRESSION: 1. No acute intracranial abnormality. 2. Atrophy with chronic small vessel ischemic disease. Electronically Signed   By: Kennith Center M.D.   On: 01/02/2023 12:57    EKG:on arrival shows CHB at 39 bpm, RBBB (personally reviewed)  TELEMETRY: CHB 30-40s (personally reviewed)  Assessment/Plan:  Unwitnessed fall Complete heart block (new)   Not on AV agents Patient has advanced dementia, he lives in memory care, ambulation is not much, frequent falls.  Consider goals of care.   Will attempt to get in touch with POA.  It does not appear she was able to reached yesterday.  Pt is not able to consent for himself.   Goals of Care  He is already DNR  For questions or updates, please contact [facility] Please consult [email or web address] for contact info under Cardiology/STEMI.  Dustin Flock, PA-C  01/04/2023  8:47 AM

## 2023-01-03 NOTE — Progress Notes (Addendum)
Rounding Note    Patient Name: Jay Webster Date of Encounter: 01/03/2023  [facility] Cardiologist: New  Subjective   Tired this morning, not able to describe any additional symptoms  Inpatient Medications    Scheduled Meds:  aspirin EC  81 mg Oral Daily   buPROPion  150 mg Oral Daily   docusate sodium  100 mg Oral BID   donepezil  5 mg Oral QHS   enoxaparin (LOVENOX) injection  40 mg Subcutaneous Q24H   levothyroxine  125 mcg Oral Q0600   modafinil  200 mg Oral Daily   OLANZapine  5 mg Oral QHS   mouth rinse  15 mL Mouth Rinse 4 times per day   sertraline  200 mg Oral Daily   Continuous Infusions:  PRN Meds: acetaminophen **OR** acetaminophen, LORazepam, ondansetron **OR** ondansetron (ZOFRAN) IV, mouth rinse, polyethylene glycol, traZODone   Vital Signs    Vitals:   01/03/23 0000 01/03/23 0130 01/03/23 0400 01/03/23 0515  BP: (!) 116/42  (!) 115/53   Pulse:  (!) 36 (!) 37 (!) 38  Resp:  16    Temp:  97.8 F (36.6 C)  98.2 F (36.8 C)  TempSrc:  Axillary  Oral  SpO2:  97% 95% 99%  Weight:        Intake/Output Summary (Last 24 hours) at 01/03/2023 0746 Last data filed at 01/02/2023 2210 Gross per 24 hour  Intake 120 ml  Output --  Net 120 ml      01/02/2023    5:34 PM 11/08/2022   11:21 AM 01/07/2022    4:03 PM  Last 3 Weights  Weight (lbs) 156 lb 12 oz 185 lb 13.6 oz   Weight (kg) 71.1 kg 84.3 kg      Information is confidential and restricted. Go to Review Flowsheets to unlock data.      Telemetry    2:1 AV block - Personally Reviewed  ECG    N/a - Personally Reviewed  Physical Exam   GEN: No acute distress.   Neck: No JVD Cardiac: bradycardic, regular Respiratory: Clear to auscultation bilaterally. GI: Soft, nontender, non-distended  MS: No edema; No deformity. Neuro:  Nonfocal  Psych: Normal affect   Labs    High Sensitivity Troponin:   Recent Labs  Lab 01/02/23 1147  TROPONINIHS 17     Chemistry Recent Labs   Lab 01/02/23 1144 01/02/23 1147 01/02/23 1747 01/03/23 0040  NA 140  --   --  139  K 4.1  --   --  3.6  CL 106  --   --  108  CO2 24  --   --  25  GLUCOSE 104*  --   --  92  BUN 19  --   --  17  CREATININE 1.03  --  0.99 1.05  CALCIUM 8.5*  --   --  8.2*  MG  --  2.3  --   --   GFRNONAA >60  --  >60 >60  ANIONGAP 10  --   --  6    Lipids No results for input(s): "CHOL", "TRIG", "HDL", "LABVLDL", "LDLCALC", "CHOLHDL" in the last 168 hours.  Hematology Recent Labs  Lab 01/02/23 1144 01/02/23 1747 01/03/23 0040  WBC 5.7 5.2 6.1  RBC 3.53* 3.55* 3.35*  HGB 10.2* 10.5* 10.0*  HCT 31.6* 31.7* 29.4*  MCV 89.5 89.3 87.8  MCH 28.9 29.6 29.9  MCHC 32.3 33.1 34.0  RDW 13.5 13.3 13.4  PLT  196 193 176   Thyroid  Recent Labs  Lab 01/02/23 1147  TSH 0.932    BNPNo results for input(s): "BNP", "PROBNP" in the last 168 hours.  DDimer No results for input(s): "DDIMER" in the last 168 hours.   Radiology    CT Head Wo Contrast  Result Date: 01/02/2023 CLINICAL DATA:  Unwitnessed fall.  Forehead hematoma. EXAM: CT HEAD WITHOUT CONTRAST TECHNIQUE: Contiguous axial images were obtained from the base of the skull through the vertex without intravenous contrast. RADIATION DOSE REDUCTION: This exam was performed according to the departmental dose-optimization program which includes automated exposure control, adjustment of the mA and/or kV according to patient size and/or use of iterative reconstruction technique. COMPARISON:  11/08/2022 FINDINGS: Brain: There is no evidence for acute hemorrhage, hydrocephalus, mass lesion, or abnormal extra-axial fluid collection. No definite CT evidence for acute infarction. Diffuse loss of parenchymal volume is consistent with atrophy. Patchy low attenuation in the deep hemispheric and periventricular white matter is nonspecific, but likely reflects chronic microvascular ischemic demyelination. Vascular: No hyperdense vessel or unexpected calcification.  Skull: No evidence for fracture. No worrisome lytic or sclerotic lesion. Sinuses/Orbits: Paranasal sinuses are obscured by motion artifact but demonstrate no substantial mucosal disease or air-fluid levels. Visualized portions of the globes and intraorbital fat are unremarkable. Other: None. IMPRESSION: 1. No acute intracranial abnormality. 2. Atrophy with chronic small vessel ischemic disease. Electronically Signed   By: Kennith Center M.D.   On: 01/02/2023 12:57    Cardiac Studies    Patient Profile     Jay Webster is a 89 y.o. male with a hx of OA, thyroid cancer s/p thyroidectomy, hypertension, RBBB, macular degeneration who is being seen 01/02/2023 for the evaluation of complete heart block at the request of Dr. Denton Lank.   Assessment & Plan    1.Complete heart block - admitted with fall, found to be in complete heart block, has had 2:1 AV block as well.  - blood pressures have been stable - he is not on any av nodal agents, normal thyroid studies - he is on aricept which has assoc with brady and av block, will d/c.  - advanced dementia, lives in memory care unit. He is very groggy this morning, not able to get good feel for his baseline mentation.    - per epic Caleen Essex is power of attorney - I placed a call to Ms Citty this afternoon but unable to reach her - will ask EP to see tomorrow and coordinate best course for patient - placed on liquid diet for AM in case required procedure   2.Dementia - currently resides in [place] memory care unit - he is A&O x 1 to just person - he reports the year is 2021, and that he lives in an apartment by himself and is currently in [place] - he is not able to understand that his heart rates are low and that he may require a pacemaker.      3.DNR  For questions or updates, please contact [facility] Please consult [email or web address] for contact info under        Signed, Dina Rich, MD  01/03/2023, 7:46 AM

## 2023-01-03 NOTE — Assessment & Plan Note (Signed)
No signs of exacerbation Continue oxymetry monitoring.  

## 2023-01-03 NOTE — Assessment & Plan Note (Signed)
Continue with levothyroxine TSH within normal levels.

## 2023-01-03 NOTE — Assessment & Plan Note (Addendum)
Complete heart block.  Telemetry personally reviewed, patient in persistent 3rd degree AV block with rate up to 40. Blood pressure has been stable.   Plan to continue telemetry monitoring Discontinue all possible AV blocking agents.  Out of bed to chair tid with meals Pt and OT. May need pacemaker implantation.  Check echocardiogram.  Keep K at 4 and Mg at 2.

## 2023-01-04 ENCOUNTER — Inpatient Hospital Stay (HOSPITAL_COMMUNITY): Payer: Medicare PPO

## 2023-01-04 DIAGNOSIS — J439 Emphysema, unspecified: Secondary | ICD-10-CM | POA: Diagnosis not present

## 2023-01-04 DIAGNOSIS — R001 Bradycardia, unspecified: Secondary | ICD-10-CM

## 2023-01-04 DIAGNOSIS — R0609 Other forms of dyspnea: Secondary | ICD-10-CM

## 2023-01-04 DIAGNOSIS — E039 Hypothyroidism, unspecified: Secondary | ICD-10-CM | POA: Diagnosis not present

## 2023-01-04 DIAGNOSIS — I10 Essential (primary) hypertension: Secondary | ICD-10-CM

## 2023-01-04 DIAGNOSIS — I459 Conduction disorder, unspecified: Secondary | ICD-10-CM

## 2023-01-04 LAB — BASIC METABOLIC PANEL
Anion gap: 7 (ref 5–15)
BUN: 20 mg/dL (ref 8–23)
CO2: 24 mmol/L (ref 22–32)
Calcium: 8.2 mg/dL — ABNORMAL LOW (ref 8.9–10.3)
Chloride: 106 mmol/L (ref 98–111)
Creatinine, Ser: 1.12 mg/dL (ref 0.61–1.24)
GFR, Estimated: >60 mL/min (ref >60)
Glucose, Bld: 100 mg/dL — ABNORMAL HIGH (ref 70–99)
Potassium: 3.7 mmol/L (ref 3.5–5.1)
Sodium: 137 mmol/L (ref 135–145)

## 2023-01-04 LAB — ECHOCARDIOGRAM COMPLETE
AR max vel: 1.39 cm2
AV Area VTI: 1.41 cm2
AV Area mean vel: 1.56 cm2
AV Mean grad: 8 mmHg
AV Peak grad: 17.5 mmHg
Ao pk vel: 2.09 m/s
Area-P 1/2: 3.42 cm2
S' Lateral: 2.5 cm
Weight: 2479.73 oz

## 2023-01-04 LAB — MAGNESIUM: Magnesium: 2.1 mg/dL (ref 1.7–2.4)

## 2023-01-04 MED ORDER — LORAZEPAM 2 MG/ML IJ SOLN: 1.0000 mg | INTRAMUSCULAR | Status: DC

## 2023-01-04 MED ORDER — LORAZEPAM 2 MG/ML IJ SOLN
2.0000 mg | INTRAMUSCULAR | Status: AC
  Administered 2023-01-04: 2 mg via INTRAVENOUS
  Filled 2023-01-04: qty 1

## 2023-01-04 NOTE — Progress Notes (Signed)
Echocardiogram 2D Echocardiogram has been performed.  Lucendia Herrlich 01/04/2023, 9:55 AM

## 2023-01-04 NOTE — Progress Notes (Addendum)
  Discussed personally with Caleen Essex, pts PoA  She is not completely sure what patients quality of life is like.  She knows that if he is "outside of his room" at the facility, then he is in a wheelchair; but may do some transfers.  She understands a pacemaker is a relative straight forward procedure, but that the risk of dislodgement or infection would be higher in someone who could not follow the precautions.   Pt had a fall last month NOT associated with bradycardia.   She verbalizes understanding that a pacemaker would treat his bradycardia, but we are not otherwise clear how this would potentially affect his quality of life or functional status.   She wishes to discuss further with his family.   She will be back in town on Wednesday afternoon, and asks that no new medical decisions be made until that point or decision regarding PPM be made until that point. DNR in place and she confirms they do not want any heroic measures if his bradycardia were to get worse.   She is reachable by phone at [phone] (it was charging on silent earlier so she just didn't hear it).   She is agreeable to palliative care consultation and understands that declining pacemaker would potentially make pt candidate for hospice services given his complete heart block.    [name] [address] [place], [place]  01/04/2023 3:23 PM
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
## 2023-01-04 NOTE — Progress Notes (Signed)
Progress Note   Patient: Jay Webster GNF:[id number] DOB: 1932-08-03 DOA: 01/02/2023     1 DOS: the patient was seen and examined on 01/04/2023   Brief hospital course: Mr. Keian Odriscoll was admitted to the hospital with the working diagnosis of bradycardia.   89 yo male with the past medical history of advanced dementia, right bundle branch block, hypertension, and hypothyroidism, who presented with syncope. He is a nursing home resident and history is limited due to advance cognitive impairment. [name] his blood pressure 122/36, HR 36, RR 10 and 02 saturation 98%, lungs with no wheezing, heart with S1 and S2 present and bradycardic with no murmurs, abdomen with no distention and no lower extremity edema.   Na 140, K 4,1 Cl 106, bicarbonate 24. Glucose 104, bun 19 cr 1,0  Mg 2,3  High sensitive troponin 17  Wbc 5,7 hgb 10,2 plt 196  TSH 0,93   Head CT with no acute changes.   EKG 39 bpm, normal axis, qtc 529, complete hear block, with no significant ST segment or T wave changes.   04/29 patient continue with bradycardia, complete heart block. Due to dementia not able to give consent for invasive interventions/ pacemaker.  EP consulted.    Assessment and Plan: * Bradycardia Complete heart block.  Telemetry personally reviewed, patient continue in 3rd degree AV block with rate up to 40. Occasional PVCs Blood pressure has been stable 113 to 127 mmHg.  Echocardiogram with preserved LV systolic function with EF 60 to 65%, No LVH, RV with preserved systolic function, no significant valvular disease.  Plan to continue telemetry monitoring Avoid all possible AV blocking agents.  Out of bed to chair tid with meals Pt and OT. May need pacemaker implantation, but so far not able to get consent from his family (not able to be reached).  Keep K at 4 and Mg at 2.   Essential hypertension His blood pressure has been stable.    Plan to continue to hold blood  pressure medications. Continue telemetry monitoring.   Pulmonary emphysema, unspecified emphysema type (HCC) No signs of exacerbation. Continue oxymetry monitoring.   Dementia (HCC) Anxiety and depression,  Positive decreased hearing.   Continue modafinil, bupropion and olanzapine.  Decrease sertraline 150 mg from 200 mg due to post marketing reports of bradycardia.  Lorazepam as needed every 4 hrs.   Hypothyroidism Continue with levothyroxine TSH within normal levels.         Subjective: Patient has been confused and disorientated but not agitated. Denies chest pain, dizziness or dyspnea.   Physical Exam: Vitals:   01/04/23 0338 01/04/23 0347 01/04/23 0759 01/04/23 1121  BP: (!) 113/56 (!) 113/56 (!) 127/51 (!) 127/51  Pulse: (!) 37 (!) 37 (!) 37 73  Resp: 19 19 18 18   Temp: 97.8 F (36.6 C) 97.8 F (36.6 C) (!) 97.4 F (36.3 C) (!) 97.4 F (36.3 C)  TempSrc: Oral  Oral Oral  SpO2: 99%  94%   Weight: 70.3 kg      Neurology awake and alert ENT with mild pallor Cardiovascular with S1 and S2 present and bradycardic with no murmur or rubs No JVD No lower extremity edema Respiratory with prolonged expiratory phase with no rhonchi or rales Abdomen with no distention  Data Reviewed:    Family Communication: no family at the bedside   Disposition: Status is: Inpatient Remains inpatient appropriate because: heart block.   Planned Discharge Destination: Home  Author: Coralie Keens, MD 01/04/2023 3:17 PM  For on call review [email or web address].

## 2023-01-04 NOTE — Plan of Care (Signed)
  Problem: Health Behavior/Discharge Planning: Goal: Ability to manage health-related needs will improve Outcome: Progressing   Problem: Activity: Goal: Risk for activity intolerance will decrease Outcome: Progressing   Problem: Nutrition: Goal: Adequate nutrition will be maintained Outcome: Progressing   Problem: Coping: Goal: Level of anxiety will decrease Outcome: Progressing   Problem: Pain Managment: Goal: General experience of comfort will improve Outcome: Progressing   Problem: Safety: Goal: Ability to remain free from injury will improve Outcome: Progressing   Problem: Skin Integrity: Goal: Risk for impaired skin integrity will decrease Outcome: Progressing   Problem: Safety: Goal: Non-violent Restraint(s) Outcome: Progressing

## 2023-01-05 DIAGNOSIS — F03B4 Unspecified dementia, moderate, with anxiety: Secondary | ICD-10-CM

## 2023-01-05 DIAGNOSIS — I442 Atrioventricular block, complete: Secondary | ICD-10-CM | POA: Diagnosis not present

## 2023-01-05 DIAGNOSIS — R001 Bradycardia, unspecified: Secondary | ICD-10-CM | POA: Diagnosis not present

## 2023-01-05 DIAGNOSIS — Z7189 Other specified counseling: Secondary | ICD-10-CM | POA: Diagnosis not present

## 2023-01-05 DIAGNOSIS — Z515 Encounter for palliative care: Secondary | ICD-10-CM

## 2023-01-05 DIAGNOSIS — Z66 Do not resuscitate: Secondary | ICD-10-CM | POA: Diagnosis not present

## 2023-01-05 DIAGNOSIS — W19XXXA Unspecified fall, initial encounter: Secondary | ICD-10-CM

## 2023-01-05 DIAGNOSIS — I10 Essential (primary) hypertension: Secondary | ICD-10-CM | POA: Diagnosis not present

## 2023-01-05 DIAGNOSIS — F03911 Unspecified dementia, unspecified severity, with agitation: Secondary | ICD-10-CM

## 2023-01-05 DIAGNOSIS — E039 Hypothyroidism, unspecified: Secondary | ICD-10-CM | POA: Diagnosis not present

## 2023-01-05 LAB — BASIC METABOLIC PANEL
Anion gap: 5 (ref 5–15)
BUN: 18 mg/dL (ref 8–23)
CO2: 23 mmol/L (ref 22–32)
Calcium: 8.1 mg/dL — ABNORMAL LOW (ref 8.9–10.3)
Chloride: 110 mmol/L (ref 98–111)
Creatinine, Ser: 0.99 mg/dL (ref 0.61–1.24)
GFR, Estimated: >60 mL/min (ref >60)
Glucose, Bld: 117 mg/dL — ABNORMAL HIGH (ref 70–99)
Potassium: 3.5 mmol/L (ref 3.5–5.1)
Sodium: 138 mmol/L (ref 135–145)

## 2023-01-05 LAB — MAGNESIUM: Magnesium: 2 mg/dL (ref 1.7–2.4)

## 2023-01-05 MED ORDER — POTASSIUM CHLORIDE CRYS ER 10 MEQ PO TBCR
40.0000 meq | EXTENDED_RELEASE_TABLET | Freq: Once | ORAL | Status: AC
  Administered 2023-01-05: 40 meq via ORAL
  Filled 2023-01-05: qty 4

## 2023-01-05 MED ORDER — ENOXAPARIN SODIUM 30 MG/0.3ML IJ SOSY: 30.0000 mg | PREFILLED_SYRINGE | INTRAMUSCULAR | Status: DC

## 2023-01-05 MED ORDER — POTASSIUM CHLORIDE CRYS ER 10 MEQ PO TBCR: 20.0000 meq | EXTENDED_RELEASE_TABLET | Freq: Once | ORAL | Status: DC

## 2023-01-05 MED ORDER — ENOXAPARIN SODIUM 40 MG/0.4ML IJ SOSY
40.0000 mg | PREFILLED_SYRINGE | Freq: Every day | INTRAMUSCULAR | Status: DC
  Administered 2023-01-05 – 2023-01-07 (×3): 40 mg via SUBCUTANEOUS
  Filled 2023-01-05 (×3): qty 0.4

## 2023-01-05 NOTE — Progress Notes (Signed)
PROGRESS NOTE    Jay Webster  ZOX:[id number] DOB: 12-20-1931 DOA: 01/02/2023 PCP: de [place], Raymond J, MD    Chief Complaint  Patient presents with   Fall    Brief Narrative:  Jay Webster was admitted to the hospital with the working diagnosis of bradycardia.   89 yo male with the past medical history of advanced dementia, right bundle branch block, hypertension, and hypothyroidism, who presented with syncope. He is a nursing home resident and history is limited due to advance cognitive impairment. [name] his blood pressure 122/36, HR 36, RR 10 and 02 saturation 98%, lungs with no wheezing, heart with S1 and S2 present and bradycardic with no murmurs, abdomen with no distention and no lower extremity edema.   Na 140, K 4,1 Cl 106, bicarbonate 24. Glucose 104, bun 19 cr 1,0  Mg 2,3  High sensitive troponin 17  Wbc 5,7 hgb 10,2 plt 196  TSH 0,93   Head CT with no acute changes.   EKG 39 bpm, normal axis, qtc 529, complete hear block, with no significant ST segment or T wave changes.   04/29 patient continue with bradycardia, complete heart block. Due to dementia not able to give consent for invasive interventions/ pacemaker.  EP consulted.   Patient seen in consult by EP who are waiting to have a family meeting with healthcare power of attorney on 01/06/2023.    Assessment & Plan:  Principal Problem:   Bradycardia Active Problems:   Essential hypertension   Pulmonary emphysema, unspecified emphysema type (HCC)   Dementia (HCC)   Hypothyroidism   Heart block   Complete heart block (HCC)    Assessment and Plan: * Bradycardia Complete heart block.  Telemetry personally reviewed, patient continue in 3rd degree AV block with rate up to 30-40s. Occasional PVCs BP this morning 115/87.  2D echocardiogram with preserved LV systolic function with EF 60 to 65%, No LVH, RV with preserved systolic function, no significant valvular disease.   -Continue cardiac monitoring. -Avoid AV nodal blocking agents.  Out of bed to chair tid with meals Pt and OT. May need pacemaker implantation, but so far not able to get consent from his family (not able to be reached).  Keep K at 4 and Mg at 2.  -Patient seen in consultation by EP who spoke with family and family meeting planned for tomorrow 01/06/2023 as healthcare power of attorney will be coming from out of town. -Per EP patient a poor candidate for permanent pacing given overall clinical trajectory, poor prognosis and advanced dementia. -Palliative care consultation also placed. -Monitor for now pending EP meeting with family healthcare power of attorney.  Essential hypertension -BP currently stable.   -Blood pressure 115/87.   -Continue to hold antihypertensive medications.  Pulmonary emphysema, unspecified emphysema type (HCC) -Currently stable.   -No signs of exacerbation.   -Follow.  Dementia (HCC) Anxiety and depression,  Positive decreased hearing.   Continue modafinil, bupropion and olanzapine.  Decreased sertraline to 150 mg from 200 mg due to post marketing reports of bradycardia per Dr. Ella Jubilee..  Lorazepam as needed every 4 hrs.   Hypothyroidism -TSH 0.932.   -Continue current dose of levothyroxine.   -Outpatient follow-up.  Will need repeat TFTs done in 4 to 6 weeks.    Heart block -See above.         DVT prophylaxis: Lovenox Code Status: DNR Family Communication: No family at bedside. Disposition: Likely back to nursing home  Status is: Inpatient Remains inpatient appropriate because: Severity of illness   Consultants:  Cardiology: Dr. Brayton Layman 01/02/2023 Electrophysiology: Dr. Lalla Brothers 01/04/2023  Procedures:  CT head 01/02/2023 2D echo 01/04/2023   Antimicrobials:  Anti-infectives (From admission, onward)    None         Subjective: Patient resting comfortably.  Sleeping deeply.  Noted to have received some Ativan this morning.   Per RN this morning agitation improving.  Objective: Vitals:   01/05/23 0021 01/05/23 0200 01/05/23 0506 01/05/23 0742  BP: (!) 104/50 117/66 139/66 115/87  Pulse: (!) 45   (!) 37  Resp: 18  17 18   Temp: (!) 97.5 F (36.4 C)  (!) 97.4 F (36.3 C)   TempSrc: Oral  Axillary   SpO2: 96%  99% 98%  Weight:        Intake/Output Summary (Last 24 hours) at 01/05/2023 1021 Last data filed at 01/05/2023 0759 Gross per 24 hour  Intake 200 ml  Output 800 ml  Net -600 ml   Filed Weights   01/02/23 1734 01/04/23 0338  Weight: 71.1 kg 70.3 kg    Examination:  General exam: Appears calm and comfortable. Respiratory system: Clear to auscultation anterior lung fields.  No wheezes, no crackles, no rhonchi.  Fair air movement.Marland Kitchen Respiratory effort normal. Cardiovascular system: Bradycardic.  No JVD, no murmurs rubs or gallops.  No lower extremity edema.  Gastrointestinal system: Abdomen is nondistended, soft and nontender. No organomegaly or masses felt. Normal bowel sounds heard. Central nervous system: Sleeping comfortably.  Moving extremities spontaneously.. No focal neurological deficits. Extremities: Symmetric 5 x 5 power. Skin: No rashes, lesions or ulcers Psychiatry: Judgement and insight to assess as patient is asleep.. Mood & affect unable to assess.    Data Reviewed:   CBC: Recent Labs  Lab 01/02/23 1144 01/02/23 1747 01/03/23 0040  WBC 5.7 5.2 6.1  HGB 10.2* 10.5* 10.0*  HCT 31.6* 31.7* 29.4*  MCV 89.5 89.3 87.8  PLT 196 193 176    Basic Metabolic Panel: Recent Labs  Lab 01/02/23 1144 01/02/23 1147 01/02/23 1747 01/03/23 0040 01/04/23 0037 01/05/23 0042  NA 140  --   --  139 137 138  K 4.1  --   --  3.6 3.7 3.5  CL 106  --   --  108 106 110  CO2 24  --   --  25 24 23   GLUCOSE 104*  --   --  92 100* 117*  BUN 19  --   --  17 20 18   CREATININE 1.03  --  0.99 1.05 1.12 0.99  CALCIUM 8.5*  --   --  8.2* 8.2* 8.1*  MG  --  2.3  --   --  2.1 2.0     GFR: Estimated Creatinine Clearance: 43.2 mL/min (by C-G formula based on SCr of 0.99 mg/dL).  Liver Function Tests: No results for input(s): "AST", "ALT", "ALKPHOS", "BILITOT", "PROT", "ALBUMIN" in the last 168 hours.  CBG: No results for input(s): "GLUCAP" in the last 168 hours.   No results found for this or any previous visit (from the past 240 hour(s)).       Radiology Studies: ECHOCARDIOGRAM COMPLETE  Result Date: 01/04/2023    ECHOCARDIOGRAM REPORT   Patient Name:   Johncarlos Holtsclaw Webster Date of Exam: 01/04/2023 Medical Rec #:  [id number]         Height:       64.0 in Accession #:    [id number]  Weight:       155.0 lb Date of Birth:  18-Mar-1932         BSA:          1.755 m Patient Age:    89 years          BP:           113/56 mmHg Patient Gender: M                 HR:           37 bpm. Exam Location:  Inpatient Procedure: 2D Echo, Cardiac Doppler and Color Doppler Indications:    Dyspnea R06.00  History:        Patient has prior history of Echocardiogram examinations, most                 recent 04/13/2018. Arrythmias:Bradycardia; Risk                 Factors:Hypertension.  Sonographer:    Lucendia Herrlich Referring Phys: [id number] MAURICIO Keigan Tafoya ARRIEN IMPRESSIONS  1. Pt appears to be in complete heart block at time of study.  2. Left ventricular ejection fraction, by estimation, is 60 to 65%. The left ventricle has normal function. The left ventricle has no regional wall motion abnormalities. Left ventricular diastolic parameters are indeterminate.  3. Right ventricular systolic function is normal. The right ventricular size is normal.  4. The mitral valve is normal in structure. Trivial mitral valve regurgitation. No evidence of mitral stenosis.  5. The aortic valve is tricuspid. Aortic valve regurgitation is not visualized. No aortic stenosis is present.  6. The inferior vena cava is dilated in size with <50% respiratory variability, suggesting right atrial pressure of 15 mmHg.  Comparison(s): No prior Echocardiogram. FINDINGS  Left Ventricle: Left ventricular ejection fraction, by estimation, is 60 to 65%. The left ventricle has normal function. The left ventricle has no regional wall motion abnormalities. The left ventricular internal cavity size was normal in size. There is  no left ventricular hypertrophy. Left ventricular diastolic parameters are indeterminate. Right Ventricle: The right ventricular size is normal. Right ventricular systolic function is normal. Left Atrium: Left atrial size was normal in size. Right Atrium: Right atrial size was normal in size. Pericardium: Trivial pericardial effusion is present. Mitral Valve: The mitral valve is normal in structure. Trivial mitral valve regurgitation. No evidence of mitral valve stenosis. Tricuspid Valve: The tricuspid valve is normal in structure. Tricuspid valve regurgitation is mild . No evidence of tricuspid stenosis. Aortic Valve: The aortic valve is tricuspid. Aortic valve regurgitation is not visualized. No aortic stenosis is present. Aortic valve mean gradient measures 8.0 mmHg. Aortic valve peak gradient measures 17.5 mmHg. Aortic valve area, by VTI measures 1.41  cm. Pulmonic Valve: The pulmonic valve was normal in structure. Pulmonic valve regurgitation is mild. No evidence of pulmonic stenosis. Aorta: The aortic root is normal in size and structure. Venous: The inferior vena cava is dilated in size with less than 50% respiratory variability, suggesting right atrial pressure of 15 mmHg. IAS/Shunts: No atrial level shunt detected by color flow Doppler. Additional Comments: Pt appears to be in complete heart block at time of study.  LEFT VENTRICLE PLAX 2D LVIDd:         4.10 cm   Diastology LVIDs:         2.50 cm   LV e' medial:   8.70 cm/s LV PW:         1.10 cm  LV E/e' medial: 8.3 LV IVS:        0.80 cm LVOT diam:     2.00 cm LV SV:         69 LV SV Index:   40 LVOT Area:     3.14 cm  RIGHT VENTRICLE             IVC RV  S prime:     17.40 cm/s  IVC diam: 2.60 cm TAPSE (M-mode): 2.2 cm LEFT ATRIUM             Index        RIGHT ATRIUM           Index LA diam:        3.60 cm 2.05 cm/m   RA Area:     14.00 cm LA Vol (A2C):   34.8 ml 19.82 ml/m  RA Volume:   30.20 ml  17.20 ml/m LA Vol (A4C):   53.9 ml 30.70 ml/m LA Biplane Vol: 46.1 ml 26.26 ml/m  AORTIC VALVE                     PULMONIC VALVE AV Area (Vmax):    1.39 cm      PR End Diast Vel: 2.38 msec AV Area (Vmean):   1.56 cm AV Area (VTI):     1.41 cm AV Vmax:           209.00 cm/s AV Vmean:          124.000 cm/s AV VTI:            0.493 m AV Peak Grad:      17.5 mmHg AV Mean Grad:      8.0 mmHg LVOT Vmax:         92.24 cm/s LVOT Vmean:        61.380 cm/s LVOT VTI:          0.221 m LVOT/AV VTI ratio: 0.45  AORTA Ao Root diam: 3.40 cm Ao Asc diam:  3.60 cm MITRAL VALVE                TRICUSPID VALVE MV Area (PHT): 3.42 cm     TR Peak grad:   29.2 mmHg MV Decel Time: 222 msec     TR Vmax:        270.00 cm/s MV E velocity: 72.60 cm/s MV A velocity: 105.00 cm/s  SHUNTS MV E/A ratio:  0.69         Systemic VTI:  0.22 m                             Systemic Diam: 2.00 cm Olga Millers MD Electronically signed by Olga Millers MD Signature Date/Time: 01/04/2023/11:52:28 AM    Final         Scheduled Meds:  aspirin EC  81 mg Oral Daily   buPROPion  150 mg Oral Daily   docusate sodium  100 mg Oral BID   enoxaparin (LOVENOX) injection  30 mg Subcutaneous Q24H   levothyroxine  125 mcg Oral Q0600   modafinil  200 mg Oral Daily   OLANZapine  5 mg Oral QHS   mouth rinse  15 mL Mouth Rinse 4 times per day   potassium chloride  40 mEq Oral Once   sertraline  150 mg Oral Daily   Continuous Infusions:   LOS: 2 days    Time spent: 35 minutes  Ramiro Harvest, MD [facility]   To contact the attending provider between 7A-7P or the covering provider during after hours 7P-7A, please log into the web site [email or web address] and access using universal [facility] password for that web site. If you do not have the password, please call the hospital operator.  01/05/2023, 10:21 AM

## 2023-01-05 NOTE — Progress Notes (Signed)
  Transition of Care [facility]) Screening Note   Patient Details  Name: Jeovani Weisenburger II Date of Birth: [birth year]   Transition of Care [facility]) CM/SW Contact:    Leone Haven, RN Phone Number: 01/05/2023, 4:52 PM    Transition of Care Department [facility]) has reviewed patient from Memory care, We will continue to monitor patient advancement through interdisciplinary progression rounds. If new patient transition needs arise, please place a TOC consult.

## 2023-01-05 NOTE — Progress Notes (Signed)
  Severe sundowning with agitation and aggression noted.   Pt is overall a poor candidate for permanent pacing given DNR, clinical trajectory, poor prognosis, and advanced dementia.  The healing process would potentially be very difficulty for him with higher than average risk of post-operative complications.   The PoA has verbalized understanding of this and requested palliative/family meeting prior to any decisions.   She is discussing with his family and plans to be in town 5/1, afternoon.    Potentially for family meeting at that time.   We will continue to follow along for decision and disposition.   [name] [address]" [place], PA-C  01/05/2023 12:15 PM

## 2023-01-05 NOTE — Consult Note (Signed)
Palliative Care Consult Note                                  Date: 01/05/2023   Patient Name: Jay Webster  DOB: April 22, 1932  MRN: [id number]  Age / Sex: 89 y.o., male  PCP: de [place], Buren Kos, MD Referring Physician: Rodolph Bong, MD  Reason for Consultation: Establishing goals of care  HPI/Patient Profile: 89 y.o. male  with past medical history of advanced dementia, right bundle branch block, hypertension, and hypothyroidism, who presented with syncope. He is a nursing home resident and history is limited due to advance cognitive impairment.  On workup he was found to be in complete heart block with a heart rate in the 30s.  He was admitted on 01/02/2023 with bradycardia secondary to complete heart block, dementia, and others.  Cardiology been consulted.  PMT was consulted for GOC conversations.  Past Medical History:  Diagnosis Date   Arthritis    OA AND PAIN LEFT HIP AND OTHER JOINT PAINS   Cancer (HCC)    THYROID CANCER - HAD THYROID REMOVED   History of kidney stones    History of shingles    RESOLVED   Hypertension    Hypothyroidism    Low back pain    Macular degeneration of both eyes    RBBB (right bundle branch block with left anterior fascicular block)    Seasonal allergies    Thyroid disease    Uric acid renal calculus 06/20/2007   Qualifier: Diagnosis of  By: Claiborne Billings CMA, Terance Ice      Subjective:   This NP Wynne Dust reviewed medical records, received report from team, assessed the patient and then meet at the patient's bedside to discuss diagnosis, prognosis, GOC, EOL wishes disposition and options.  I met with the patient at the bedside.  I spoke with the patient's POA Rejeana Brock on the phone.   Concept of Palliative Care was introduced as specialized medical care for people and their families living with serious illness.  If focuses on providing relief from the symptoms and stress of a serious  illness.  The goal is to improve quality of life for both the patient and the family. Values and goals of care important to patient and family were attempted to be elicited.  Created space and opportunity for patient  and family to explore thoughts and feelings regarding current medical situation   Natural trajectory and current clinical status were discussed. Questions and concerns addressed. Patient  encouraged to call with questions or concerns.    Patient/Family Understanding of Illness: The patient is confused and sleepy, unable to have a good goals of care conversation.  Per previously documented notes the patient's POA understands that he is in complete heart block and is not a good candidate for pacemaker placement.  They recognize the need for a goals of care meeting.  Unfortunately, the POA is out of town until tomorrow.  Life Review: TBD  Goals: TBD  Today's Discussion: When I saw the patient at the bedside he looked a bit restless.  When I said hello he did make eye contact.  Asked that he is feeling and he states he is very sleepy.  He denies pain, dyspnea, nausea, vomiting.  He seems a bit confused when I tried to talk to him.  I asked him if he would like me to let him rest and he  said yes.  At that point I excuse myself in the room.  I called the patient's HCPOA Deborah.  I confirmed desire for DNR status.  I introduced palliative medicine and our role in the healthcare team.  I discussed my understanding that we are planning for a goals of care meeting tomorrow and she confirmed, but states that nobody his inquired about this time.  I asked her what time would work well for her.  She is out of town and will be arriving back in town tomorrow around 1:00 PM and we settled on 2:00 PM is a good time for the family meeting.  I informed her of trying to get as many of the healthcare team present as possible.  I messaged the hospitalist and cardiology team for limited time.  Cardiology  will be present to again explain his poor candidacy for pacemaker.  I provided emotional and general support through therapeutic listening, empathy, sharing of stories, and other techniques. I answered all questions and addressed all concerns to the best of my ability.  Limited due to confusion and sleepiness Review of Systems  Constitutional:  Positive for fatigue.    Objective:   Primary Diagnoses: Present on Admission:  Bradycardia  Pulmonary emphysema, unspecified emphysema type (HCC)  Dementia (HCC)  Essential hypertension  Hypothyroidism  Heart block   Physical Exam Vitals and nursing note reviewed.  Constitutional:      General: He is sleeping. He is not in acute distress. HENT:     Head: Normocephalic and atraumatic.  Cardiovascular:     Rate and Rhythm: Bradycardia present.  Pulmonary:     Effort: Pulmonary effort is normal. No respiratory distress.  Abdominal:     General: Abdomen is flat.  Neurological:     Mental Status: He is easily aroused. He is lethargic, disoriented and confused.     Vital Signs:  BP (!) 147/54 (BP Location: Left Arm)   Pulse (!) 36   Temp 97.6 F (36.4 C) (Oral)   Resp 18   Wt 70.3 kg   SpO2 97%   BMI 26.60 kg/m   Palliative Assessment/Data: 20-30%    Advanced Care Planning:   Existing Vynca/ACP Documentation: DNR most recently uploaded 08/12/2017  Primary Decision Maker: HCPOA  Code Status/Advance Care Planning: DNR  A discussion was had today regarding advanced directives. Concepts specific to code status, artifical feeding and hydration, continued IV antibiotics and rehospitalization was had.  The difference between a aggressive medical intervention path and a palliative comfort care path for this patient at this time was had.   Decisions/Changes to ACP: None today  Assessment & Plan:   Impression: 89 year old male with chronic comorbidities and acute presentations as described above.  The patient is a bit  confused and unable to have a significant goals of care conversation today.  I reached out to his HCPOA who is out of town but will be returning tomorrow.  We have scheduled a meeting for 2:00 tomorrow to discuss goals of care and options.  Cardiology has plans to be in attendance as well.  Overall prognosis poor.  SUMMARY OF RECOMMENDATIONS   DNR Continue current scope of care for now Planned family meeting 01/06/2023 at 2:00 PM PMT will continue to follow  Symptom Management:  Per primary team PMT is available to assist as needed  Prognosis:  Unable to determine  Discharge Planning:  To Be Determined   Discussed with: Patient's HCPOA, medical team, nursing    Thank you for  allowing [place] to participate in the care of Jay Webster PMT will continue to support holistically.  Billing based on MDM: High  Problems Addressed: One acute or chronic illness or injury that poses a threat to life or bodily function  Amount and/or Complexity of Data: Category 3:Discussion of management or test interpretation with external physician/other qualified health care professional/appropriate source (not separately reported)  Risks: Decision not to resuscitate or to de-escalate care because of poor prognosis   Signed by: Wynne Dust, NP Palliative Medicine Team  Team Phone # [phone] (Nights/Weekends)  01/05/2023, 12:29 PM

## 2023-01-05 NOTE — Assessment & Plan Note (Signed)
See above

## 2023-01-06 DIAGNOSIS — R001 Bradycardia, unspecified: Secondary | ICD-10-CM | POA: Diagnosis not present

## 2023-01-06 DIAGNOSIS — Z66 Do not resuscitate: Secondary | ICD-10-CM | POA: Diagnosis not present

## 2023-01-06 DIAGNOSIS — Z7189 Other specified counseling: Secondary | ICD-10-CM | POA: Diagnosis not present

## 2023-01-06 DIAGNOSIS — J439 Emphysema, unspecified: Secondary | ICD-10-CM | POA: Diagnosis not present

## 2023-01-06 DIAGNOSIS — W19XXXA Unspecified fall, initial encounter: Secondary | ICD-10-CM | POA: Diagnosis not present

## 2023-01-06 DIAGNOSIS — I10 Essential (primary) hypertension: Secondary | ICD-10-CM | POA: Diagnosis not present

## 2023-01-06 DIAGNOSIS — Z515 Encounter for palliative care: Secondary | ICD-10-CM | POA: Diagnosis not present

## 2023-01-06 DIAGNOSIS — F03911 Unspecified dementia, unspecified severity, with agitation: Secondary | ICD-10-CM | POA: Diagnosis not present

## 2023-01-06 LAB — MAGNESIUM: Magnesium: 2.1 mg/dL (ref 1.7–2.4)

## 2023-01-06 LAB — CBC
HCT: 33.7 % — ABNORMAL LOW (ref 39.0–52.0)
Hemoglobin: 11.2 g/dL — ABNORMAL LOW (ref 13.0–17.0)
MCH: 29.2 pg (ref 26.0–34.0)
MCHC: 33.2 g/dL (ref 30.0–36.0)
MCV: 88 fL (ref 80.0–100.0)
Platelets: 228 10*3/uL (ref 150–400)
RBC: 3.83 MIL/uL — ABNORMAL LOW (ref 4.22–5.81)
RDW: 13.4 % (ref 11.5–15.5)
WBC: 4.6 10*3/uL (ref 4.0–10.5)
nRBC: 0 % (ref 0.0–0.2)

## 2023-01-06 LAB — BASIC METABOLIC PANEL
Anion gap: 5 (ref 5–15)
BUN: 14 mg/dL (ref 8–23)
CO2: 25 mmol/L (ref 22–32)
Calcium: 8.5 mg/dL — ABNORMAL LOW (ref 8.9–10.3)
Chloride: 110 mmol/L (ref 98–111)
Creatinine, Ser: 0.93 mg/dL (ref 0.61–1.24)
GFR, Estimated: >60 mL/min (ref >60)
Glucose, Bld: 103 mg/dL — ABNORMAL HIGH (ref 70–99)
Potassium: 3.9 mmol/L (ref 3.5–5.1)
Sodium: 140 mmol/L (ref 135–145)

## 2023-01-06 MED ORDER — ORAL CARE MOUTH RINSE: 15.0000 mL | OROMUCOSAL | Status: DC | PRN

## 2023-01-06 NOTE — Progress Notes (Signed)
Pt remains in CHB in 30s. Overall a poor candidate for pacing.   DNR, plan for GOC meeting this afternoon.  Confused but not agitated overnight.   [name] [address] [place], PA-C  01/06/2023 7:14 AM

## 2023-01-06 NOTE — Progress Notes (Signed)
  Attended GOC meeting with PoA and Palliative care team.   Power of attorney declines any procedures and wishes to enroll pt in hospice care.   Cardiology will be available to see the pt as needed if plans change.   [name] [address]" [place], PA-C  01/06/2023 2:36 PM

## 2023-01-06 NOTE — Progress Notes (Signed)
Daily Progress Note   Patient Name: Jay Webster       Date: 01/06/2023 DOB: 1932/03/11  Age: 89 y.o. MRN#: [id number] Attending Physician: Merlene Laughter, DO Primary Care Physician: de [place], Raymond J, MD Admit Date: 01/02/2023 Length of Stay: 3 days  Reason for Consultation/Follow-up: Establishing goals of care  HPI/Patient Profile:  89 y.o. male  with past medical history of advanced dementia, right bundle branch block, hypertension, and hypothyroidism, who presented with syncope. He is a nursing home resident and history is limited due to advance cognitive impairment.  On workup he was found to be in complete heart block with a heart rate in the 30s.  He was admitted on 01/02/2023 with bradycardia secondary to complete heart block, dementia, and others.  Cardiology been consulted.   PMT was consulted for GOC conversations.  Subjective:   Subjective: Chart Reviewed. Updates received. Patient Assessed. Created space and opportunity for patient  and family to explore thoughts and feelings regarding current medical situation.  Today's Discussion: Today saw the patient at bedside, he is quite confused.  He is not oriented to person, place, time.  Is consistent with history of advanced dementia.  Also present was the patient's healthcare power of attorney Caleen Essex and Maxine Glenn, [place] with cardiology.  When I spoke with the patient he states he just woke up, does not remember anything yesterday.  He thinks he fell but does not remember much else.  He states that he lives alone, which his power of attorney states is not true.  He admits that he does not know who he is or what is going on.    We had a robust discussion about the patient's current situation with [facility]).  We discussed that he is in complete heart block and explanation of pathophysiology surrounding complete heart block.  We discussed standard of care would be a permanent pacemaker but cardiology feels he is not  a good candidate for this for a multitude of reasons including risk for dislodgment, infection, and that it would not provide prolonged survival given his current baseline health history.  After long discussion the patient's power of attorney agreed that he should not have any surgeries or procedures.  We discussed shifting her focus to a comfort care trajectory including referral for possible hospice care.  The patient currently lives at [facility] memory care.  We discussed that hospice can be consulted to provide services in place at his current long-term care facility.  She is excepted this and would like to enroll with hospice.  I explained that TOC would reach out to provide a list of services and allow choice.  When she is decided who she would like to work with then a referral would be sent to them.  I provided emotional and general support through therapeutic listening, empathy, sharing of stories, therapeutic touch, and other techniques. I answered all questions and addressed all concerns to the best of my ability.  ROS Limited by baseline confusion Review of Systems  Constitutional:        Denies pain in general  Gastrointestinal:  Negative for abdominal pain, nausea and vomiting.    Objective:   Vital Signs:  BP 139/66 (BP Location: Left Arm)   Pulse (!) 34   Temp (!) 97.5 F (36.4 C) (Axillary)   Resp 16   Wt 70.3 kg   SpO2 97%   BMI 26.60 kg/m   Physical Exam: Physical Exam Vitals and nursing note  reviewed.  HENT:     Head: Normocephalic and atraumatic.  Cardiovascular:     Rate and Rhythm: Bradycardia present.  Pulmonary:     Effort: Pulmonary effort is normal. No respiratory distress.  Abdominal:     General: Abdomen is flat.  Skin:    General: Skin is warm and dry.  Neurological:     Mental Status: He is alert. He is disoriented and confused.  Psychiatric:        Mood and Affect: Mood normal.        Behavior: Behavior normal.     Palliative  Assessment/Data: 30-40%    Existing Vynca/ACP Documentation: DNR uploaded 08/12/2017  Assessment & Plan:   Impression: Present on Admission:  Bradycardia  Pulmonary emphysema, unspecified emphysema type (HCC)  Dementia (HCC)  Essential hypertension  Hypothyroidism  Heart block  SUMMARY OF RECOMMENDATIONS   DNR TOC consult for referral to hospice in place at memory care Continued emotional support of patient and family Anticipate discharge back to memory care with hospice in place in the next 24 to 48 hours PMT will shaft of the patient's chart for any significant changes or new needs Please notify us of any significant changes or new palliative needs  Symptom Management:  Per primary team PMT is available to assist as needed  Code Status: DNR  Prognosis: Unable to determine  Discharge Planning: Skilled Nursing Facility with Hospice  Discussed with: Patient's HCPOA, medical team, nursing team, [facility] team  Thank you for allowing [place] to participate in the care of Jay Webster PMT will continue to support holistically.  Billing based on MDM: High  Problems Addressed: One acute or chronic illness or injury that poses a threat to life or bodily function  Amount and/or Complexity of Data: Category 3:Discussion of management or test interpretation with external physician/other qualified health care professional/appropriate source (not separately reported)  Risks: Decision regarding elective major surgery with identified patient or procedure risk factors and Decision not to resuscitate or to de-escalate care because of poor prognosis (decision on no procedure/PPM placement, confirmed DNR, electing hospice services)  Wynne Dust, NP Palliative Medicine Team  Team Phone # [phone] (Nights/Weekends)  05/06/2021, 8:17 AM

## 2023-01-06 NOTE — Plan of Care (Signed)
  Problem: Nutrition: Goal: Adequate nutrition will be maintained Outcome: Progressing   Problem: Coping: Goal: Level of anxiety will decrease Outcome: Progressing   Problem: Safety: Goal: Ability to remain free from injury will improve Outcome: Progressing   Problem: Safety: Goal: Non-violent Restraint(s) Outcome: Completed/Met

## 2023-01-06 NOTE — Progress Notes (Signed)
EOS: Pt slept most of shift; did not sleep night before. Took pills whole in pudding. Confused, not redirectable - will refuse to follow commands but then do them while stating this. Pleasant while awake.

## 2023-01-06 NOTE — Progress Notes (Signed)
PROGRESS NOTE    Jay Webster  ZOX:[id number] DOB: 08-23-1932 DOA: 01/02/2023 PCP: de [place], Raymond J, MD   Brief Narrative:  [name] was admitted to the hospital with the working diagnosis of bradycardia.   89 yo male with the past medical history of advanced dementia, right bundle branch block, hypertension, and hypothyroidism, who presented with syncope. He is a nursing home resident and history is limited due to advance cognitive impairment. [name] his blood pressure 122/36, HR 36, RR 10 and 02 saturation 98%, lungs with no wheezing, heart with S1 and S2 present and bradycardic with no murmurs, abdomen with no distention and no lower extremity edema.   Na 140, K 4,1 Cl 106, bicarbonate 24. Glucose 104, bun 19 cr 1,0  Mg 2,3  High sensitive troponin 17  Wbc 5,7 hgb 10,2 plt 196  TSH 0,93   Head CT with no acute changes.   EKG 39 bpm, normal axis, qtc 529, complete hear block, with no significant ST segment or T wave changes.   04/29 patient continue with bradycardia, complete heart block. Due to dementia not able to give consent for invasive interventions/ pacemaker.  EP consulted.   Patient seen in consult by EP who are waiting to have a family meeting with healthcare power of attorney on 01/06/2023.   **Interim History After further goals of care discussion with the palliative care team and the EP cardiology team the patient's healthcare power of attorney has elected to enroll in the patient in hospice at his facility with no further interventions or procedures so patient will not get a permanent pacemaker.  TOC has been notified of hospice consult and anticipating discharging next 24 to 48 hours  Assessment and Plan: * Bradycardia Complete heart block.  -Telemetry personally reviewed, patient continue in 3rd degree AV block with rate up to 30-40s. -Occasional PVCs -BP this morning 139/66.  -2D echocardiogram with preserved LV systolic  function with EF 60 to 65%, No LVH, RV with preserved systolic function, no significant valvular disease.  -Continue cardiac monitoring while hospitalized . -Avoid AV nodal blocking agents. -Out of bed to chair tid with meals -Keep K at 4 and Mg at 2.  -Patient seen in consultation by EP who spoke with family and family meeting planned for tomorrow 01/06/2023 as healthcare power of attorney will be coming from out of town. -Per EP patient a poor candidate for permanent pacing given overall clinical trajectory, poor prognosis and advanced dementia. -TSH was 0.932 -Palliative care consultation also placed for further goals of care discussion healthcare power of attorney has decided to not have any further procedures and wishes to enroll the patient in hospice care at the patient's memory care unit where he had been living  [facility]) so now cardiology has signed off.  Essential Hypertension -BP currently stable.   -Blood pressure 139/66.   -Continue to hold antihypertensive medications and continue to Monitor BP per Protocol -Last BP reading was   Pulmonary Emphysema, unspecified emphysema type (HCC) -Currently stable.   -No signs of exacerbation.   SpO2: 97 % -Continue to Follow for any Clinical Decompensation  Dementia (HCC) Anxiety and Depression Positive decreased hearing.  -Continue modafinil, bupropion and olanzapine.  -Decreased sertraline to 150 mg from 200 mg due to post marketing reports of bradycardia per Dr. Ella Jubilee..  -Lorazepam as needed every 4 hrs.   Hypothyroidism -TSH 0.932.   -Continue current dose of Levothyroxine.   -Outpatient follow-up.  Will need repeat TFTs done in 4 to 6 weeks if desired based on GOC     Heart Block -See above.  Normocytic Anemia -Hgb/Hct Trend: Recent Labs  Lab 01/02/23 1144 01/02/23 1747 01/03/23 0040 01/06/23 0107  HGB 10.2* 10.5* 10.0* 11.2*  HCT 31.6* 31.7* 29.4* 33.7*  MCV 89.5 89.3 87.8 88.0  -Continue to Monitor for  S/Sx of Bleeding; No overt bleeding noted -Repeat CBC intermittently given CBC stability   DVT prophylaxis: enoxaparin (LOVENOX) injection 40 mg Start: 01/05/23 1100 Place and maintain sequential compression device Start: 01/03/23 1642 SCDs Start: 01/02/23 1335    Code Status: DNR Family Communication: No family present at bedside   Disposition Plan:  Level of care: Telemetry Cardiac Status is: Inpatient Remains inpatient appropriate because: Is a poor candidate for permanent pacing     Consultants:  EP Cardiology   Procedures:  ECHOCARDIOGRAM IMPRESSIONS     1. Pt appears to be in complete heart block at time of study.   2. Left ventricular ejection fraction, by estimation, is 60 to 65%. The  left ventricle has normal function. The left ventricle has no regional  wall motion abnormalities. Left ventricular diastolic parameters are  indeterminate.   3. Right ventricular systolic function is normal. The right ventricular  size is normal.   4. The mitral valve is normal in structure. Trivial mitral valve  regurgitation. No evidence of mitral stenosis.   5. The aortic valve is tricuspid. Aortic valve regurgitation is not  visualized. No aortic stenosis is present.   6. The inferior vena cava is dilated in size with <50% respiratory  variability, suggesting right atrial pressure of 15 mmHg.   Comparison(s): No prior Echocardiogram.   FINDINGS   Left Ventricle: Left ventricular ejection fraction, by estimation, is 60  to 65%. The left ventricle has normal function. The left ventricle has no  regional wall motion abnormalities. The left ventricular internal cavity  size was normal in size. There is   no left ventricular hypertrophy. Left ventricular diastolic parameters  are indeterminate.   Right Ventricle: The right ventricular size is normal. Right ventricular  systolic function is normal.   Left Atrium: Left atrial size was normal in size.   Right Atrium: Right  atrial size was normal in size.   Pericardium: Trivial pericardial effusion is present.   Mitral Valve: The mitral valve is normal in structure. Trivial mitral  valve regurgitation. No evidence of mitral valve stenosis.   Tricuspid Valve: The tricuspid valve is normal in structure. Tricuspid  valve regurgitation is mild . No evidence of tricuspid stenosis.   Aortic Valve: The aortic valve is tricuspid. Aortic valve regurgitation is  not visualized. No aortic stenosis is present. Aortic valve mean gradient  measures 8.0 mmHg. Aortic valve peak gradient measures 17.5 mmHg. Aortic  valve area, by VTI measures 1.41   cm.   Pulmonic Valve: The pulmonic valve was normal in structure. Pulmonic valve  regurgitation is mild. No evidence of pulmonic stenosis.   Aorta: The aortic root is normal in size and structure.   Venous: The inferior vena cava is dilated in size with less than 50%  respiratory variability, suggesting right atrial pressure of 15 mmHg.   IAS/Shunts: No atrial level shunt detected by color flow Doppler.   Additional Comments: Pt appears to be in complete heart block at time of  study.     LEFT VENTRICLE  PLAX 2D  LVIDd:         4.10  cm   Diastology  LVIDs:         2.50 cm   LV e' medial:   8.70 cm/s  LV PW:         1.10 cm   LV E/e' medial: 8.3  LV IVS:        0.80 cm  LVOT diam:     2.00 cm  LV SV:         69  LV SV Index:   40  LVOT Area:     3.14 cm     RIGHT VENTRICLE             IVC  RV S prime:     17.40 cm/s  IVC diam: 2.60 cm  TAPSE (M-mode): 2.2 cm   LEFT ATRIUM             Index        RIGHT ATRIUM           Index  LA diam:        3.60 cm 2.05 cm/m   RA Area:     14.00 cm  LA Vol (A2C):   34.8 ml 19.82 ml/m  RA Volume:   30.20 ml  17.20 ml/m  LA Vol (A4C):   53.9 ml 30.70 ml/m  LA Biplane Vol: 46.1 ml 26.26 ml/m   AORTIC VALVE                     PULMONIC VALVE  AV Area (Vmax):    1.39 cm      PR End Diast Vel: 2.38 msec  AV Area  (Vmean):   1.56 cm  AV Area (VTI):     1.41 cm  AV Vmax:           209.00 cm/s  AV Vmean:          124.000 cm/s  AV VTI:            0.493 m  AV Peak Grad:      17.5 mmHg  AV Mean Grad:      8.0 mmHg  LVOT Vmax:         92.24 cm/s  LVOT Vmean:        61.380 cm/s  LVOT VTI:          0.221 m  LVOT/AV VTI ratio: 0.45    AORTA  Ao Root diam: 3.40 cm  Ao Asc diam:  3.60 cm   MITRAL VALVE                TRICUSPID VALVE  MV Area (PHT): 3.42 cm     TR Peak grad:   29.2 mmHg  MV Decel Time: 222 msec     TR Vmax:        270.00 cm/s  MV E velocity: 72.60 cm/s  MV A velocity: 105.00 cm/s  SHUNTS  MV E/A ratio:  0.69         Systemic VTI:  0.22 m                              Systemic Diam: 2.00 cm    Antimicrobials:  Anti-infectives (From admission, onward)    None       Subjective: Seen and examined at bedside and he is pleasantly confused and demented.  Unable to participate in subjective examination.  Denies any complaints.  Resting comfortably and plan is for further  goals of care discussion this afternoon with palliative and EP as well as the patient's healthcare power of attorney.  Objective: Vitals:   01/05/23 1702 01/05/23 1916 01/06/23 0046 01/06/23 0629  BP: (!) 150/67 136/64 (!) 143/60 139/66  Pulse: (!) 36 (!) 37 (!) 32 (!) 34  Resp: 18 18 18 16   Temp: 97.6 F (36.4 C) 98.2 F (36.8 C) 98 F (36.7 C) (!) 97.5 F (36.4 C)  TempSrc: Oral Oral Oral Axillary  SpO2: 95% 98% 96% 97%  Weight:        Intake/Output Summary (Last 24 hours) at 01/06/2023 1354 Last data filed at 01/06/2023 0052 Gross per 24 hour  Intake --  Output 200 ml  Net -200 ml   Filed Weights   01/02/23 1734 01/04/23 0338  Weight: 71.1 kg 70.3 kg   Examination: Physical Exam:  Constitutional: Slightly overweight chronically ill-appearing elderly Caucasian male who is pleasantly confused and demented Respiratory: Diminished to auscultation bilaterally, no wheezing, rales, rhonchi or crackles.  Normal respiratory effort and patient is not tachypenic. No accessory muscle use.  Unlabored breathing Cardiovascular: Bradycardic rate with no appreciable extremity edema.  Has no appreciable murmurs, rubs, gallops. Abdomen: Soft, non-tender, slightly distended secondary to body habitus. Bowel sounds positive.  GU: Deferred. Musculoskeletal: No clubbing / cyanosis of digits/nails. No joint deformity upper and lower extremities.  Skin: No rashes, lesions, ulcers on a limited skin evaluation. No induration; Warm and dry.  Neurologic: Pleasantly confused and demented and independently moves all extremities Psychiatric: Impaired judgment and insight.  He is awake but not fully alert oriented  Data Reviewed: I have personally reviewed following labs and imaging studies  CBC: Recent Labs  Lab 01/02/23 1144 01/02/23 1747 01/03/23 0040 01/06/23 0107  WBC 5.7 5.2 6.1 4.6  HGB 10.2* 10.5* 10.0* 11.2*  HCT 31.6* 31.7* 29.4* 33.7*  MCV 89.5 89.3 87.8 88.0  PLT 196 193 176 228   Basic Metabolic Panel: Recent Labs  Lab 01/02/23 1144 01/02/23 1147 01/02/23 1747 01/03/23 0040 01/04/23 0037 01/05/23 0042 01/06/23 0107  NA 140  --   --  139 137 138 140  K 4.1  --   --  3.6 3.7 3.5 3.9  CL 106  --   --  108 106 110 110  CO2 24  --   --  25 24 23 25   GLUCOSE 104*  --   --  92 100* 117* 103*  BUN 19  --   --  17 20 18 14   CREATININE 1.03  --  0.99 1.05 1.12 0.99 0.93  CALCIUM 8.5*  --   --  8.2* 8.2* 8.1* 8.5*  MG  --  2.3  --   --  2.1 2.0 2.1   GFR: Estimated Creatinine Clearance: 46 mL/min (by C-G formula based on SCr of 0.93 mg/dL). Liver Function Tests: No results for input(s): "AST", "ALT", "ALKPHOS", "BILITOT", "PROT", "ALBUMIN" in the last 168 hours. No results for input(s): "LIPASE", "AMYLASE" in the last 168 hours. No results for input(s): "AMMONIA" in the last 168 hours. Coagulation Profile: No results for input(s): "INR", "PROTIME" in the last 168 hours. Cardiac  Enzymes: No results for input(s): "CKTOTAL", "CKMB", "CKMBINDEX", "TROPONINI" in the last 168 hours. BNP (last 3 results) No results for input(s): "PROBNP" in the last 8760 hours. HbA1C: No results for input(s): "HGBA1C" in the last 72 hours. CBG: No results for input(s): "GLUCAP" in the last 168 hours. Lipid Profile: No results for input(s): "CHOL", "HDL", "LDLCALC", "TRIG", "  CHOLHDL", "LDLDIRECT" in the last 72 hours. Thyroid Function Tests: No results for input(s): "TSH", "T4TOTAL", "FREET4", "T3FREE", "THYROIDAB" in the last 72 hours. Anemia Panel: No results for input(s): "VITAMINB12", "FOLATE", "FERRITIN", "TIBC", "IRON", "RETICCTPCT" in the last 72 hours. Sepsis Labs: No results for input(s): "PROCALCITON", "LATICACIDVEN" in the last 168 hours.  No results found for this or any previous visit (from the past 240 hour(s)).   Radiology Studies: No results found.  Scheduled Meds:  aspirin EC  81 mg Oral Daily   buPROPion  150 mg Oral Daily   docusate sodium  100 mg Oral BID   enoxaparin (LOVENOX) injection  40 mg Subcutaneous Daily   levothyroxine  125 mcg Oral Q0600   modafinil  200 mg Oral Daily   OLANZapine  5 mg Oral QHS   mouth rinse  15 mL Mouth Rinse 4 times per day   sertraline  150 mg Oral Daily   Continuous Infusions:   LOS: 3 days   Marguerita Merles, DO [facility] Available via Epic secure chat 7am-7pm After these hours, please refer to coverage provider listed on [email or web address] 01/06/2023, 1:54 PM

## 2023-01-07 DIAGNOSIS — F02818 Dementia in other diseases classified elsewhere, unspecified severity, with other behavioral disturbance: Secondary | ICD-10-CM

## 2023-01-07 DIAGNOSIS — J439 Emphysema, unspecified: Secondary | ICD-10-CM | POA: Diagnosis not present

## 2023-01-07 DIAGNOSIS — R531 Weakness: Secondary | ICD-10-CM

## 2023-01-07 DIAGNOSIS — G309 Alzheimer's disease, unspecified: Secondary | ICD-10-CM | POA: Diagnosis not present

## 2023-01-07 DIAGNOSIS — R001 Bradycardia, unspecified: Secondary | ICD-10-CM | POA: Diagnosis not present

## 2023-01-07 DIAGNOSIS — I10 Essential (primary) hypertension: Secondary | ICD-10-CM | POA: Diagnosis not present

## 2023-01-07 MED ORDER — OLANZAPINE 5 MG PO TABS
5.0000 mg | ORAL_TABLET | ORAL | Status: AC
  Administered 2023-01-07: 5 mg via ORAL
  Filled 2023-01-07: qty 1

## 2023-01-07 MED ORDER — ONDANSETRON HCL 4 MG PO TABS: 4.0000 mg | ORAL_TABLET | Freq: Four times a day (QID) | ORAL | 0 refills | Status: DC | PRN

## 2023-01-07 MED ORDER — SERTRALINE HCL 50 MG PO TABS: 150.0000 mg | ORAL_TABLET | Freq: Every day | ORAL | Status: DC

## 2023-01-07 MED ORDER — LORAZEPAM 0.5 MG PO TABS: 0.5000 mg | ORAL_TABLET | ORAL | 0 refills | Status: DC | PRN

## 2023-01-07 MED ORDER — DOCUSATE SODIUM 100 MG PO CAPS: 100.0000 mg | ORAL_CAPSULE | Freq: Two times a day (BID) | ORAL | 0 refills | Status: DC

## 2023-01-07 MED ORDER — POLYETHYLENE GLYCOL 3350 17 G PO PACK: 17.0000 g | PACK | Freq: Every day | ORAL | 0 refills | Status: DC | PRN

## 2023-01-07 MED ORDER — LORAZEPAM 2 MG/ML IJ SOLN
1.0000 mg | Freq: Once | INTRAMUSCULAR | Status: AC
  Administered 2023-01-07: 1 mg via INTRAVENOUS
  Filled 2023-01-07: qty 1

## 2023-01-07 MED ORDER — OLANZAPINE 5 MG PO TABS
5.0000 mg | ORAL_TABLET | Freq: Two times a day (BID) | ORAL | Status: DC
  Administered 2023-01-07 – 2023-01-08 (×2): 5 mg via ORAL
  Filled 2023-01-07 (×3): qty 1

## 2023-01-07 MED ORDER — OLANZAPINE 5 MG PO TABS: 5.0000 mg | ORAL_TABLET | Freq: Two times a day (BID) | ORAL | Status: DC

## 2023-01-07 NOTE — Plan of Care (Signed)
  Problem: Coping: Goal: Level of anxiety will decrease Outcome: Progressing   Problem: Pain Managment: Goal: General experience of comfort will improve Outcome: Progressing   Problem: Safety: Goal: Ability to remain free from injury will improve Outcome: Progressing   Problem: Skin Integrity: Goal: Risk for impaired skin integrity will decrease Outcome: Progressing   Problem: Safety: Goal: Non-violent Restraint(s) Outcome: Progressing   

## 2023-01-07 NOTE — TOC Initial Note (Signed)
Transition of Care [facility]) - Initial/Assessment Note    Patient Details  Name: Jay Webster MRN: [id number] Date of Birth: Oct 08, 1931  Transition of Care [facility] CM/SW Contact:    Leander Rams, LCSW Phone Number: 01/07/2023, 8:30 AM  Clinical Narrative:                 CSW spoke with [place] from [facility] care. Latoya stated that when pt returns, they will need dc summary. CSW provided name and phone number in case their DON needs to come to the hospital to do a assessment on pt.   TOC will continue to follow.         Patient Goals and CMS Choice            Expected Discharge Plan and Services                                              Prior Living Arrangements/Services                       Activities of Daily Living      Permission Sought/Granted                  Emotional Assessment              Admission diagnosis:  Complete heart block (HCC) [I44.2] Bradycardia [R00.1] Accidental fall, initial encounter [W19.XXXA] Moderate dementia with anxiety, unspecified dementia type (HCC) [F03.B4] Heart block [I45.9] Patient Active Problem List   Diagnosis Date Noted   Complete heart block (HCC) 01/05/2023   Hypothyroidism 01/03/2023   Heart block 01/03/2023   Bradycardia 01/02/2023   Dementia with behavioral disturbance (HCC) 01/30/2022   Major depressive disorder, recurrent episode, severe (HCC) 01/08/2022   Cognitive and behavioral changes 01/07/2022   Abdominal pain 10/30/2021   Open fracture of tuft of distal phalanx of finger 09/16/2021   Depression, major, single episode, moderate (HCC) 03/26/2021   Decreased hearing of both ears 03/26/2021   Decreased visual acuity 03/26/2021   Pulmonary emphysema, unspecified emphysema type (HCC) 02/19/2020   Atherosclerosis of aorta (HCC) 02/19/2020   Dementia (HCC) 02/19/2020   Pseudophakia of both eyes 12/13/2019   Former smoker 08/16/2019   Depression, major, single  episode, mild (HCC) 08/16/2019   Diffuse large B-cell lymphoma (HCC) 03/30/2018   Degeneration of lumbar intervertebral disc 10/05/2017   History of total hip arthroplasty 10/05/2017   OA (osteoarthritis) of hip 05/31/2013   History of thyroid cancer 04/21/2011   Macular degeneration 01/05/2011   HYPOTHYROIDISM, POSTSURGICAL 11/20/2008   Osteoarthritis 07/01/2007   Uric acid renal calculus 06/20/2007   Essential hypertension 06/20/2007   PCP:  de [place], Raymond J, MD Pharmacy:   [phone] [place], [place] - [address] [place] [address] Phone: [phone] Fax: [phone]     Social Determinants of Health (SDOH) Social History: SDOH Screenings   Food Insecurity: No Food Insecurity (05/27/2020)  Housing: Low Risk  (05/27/2020)  Transportation Needs: No Transportation Needs (05/27/2020)  Alcohol Screen: Low Risk  (01/07/2022)  Depression (PHQ2-9): Low Risk  (12/22/2021)  Financial Resource Strain: Low Risk  (05/27/2020)  Physical Activity: Inactive (05/27/2020)  Social Connections: Socially Isolated (05/27/2020)  Stress: No Stress Concern Present (05/27/2020)  Tobacco Use: Medium Risk (01/02/2023)   SDOH Interventions:     Readmission  Risk Interventions     No data to display        Oletta Lamas, MSW, LCSWA, LCASA Transitions of Care  Clinical Social Worker I

## 2023-01-07 NOTE — TOC Progression Note (Signed)
Transition of Care [facility]) - Progression Note    Patient Details  Name: Jay Webster MRN: [id number] Date of Birth: 1932/08/13  Transition of Care [facility]) CM/SW Contact  Leander Rams, LCSW Phone Number: 01/07/2023, 2:27 PM  Clinical Narrative:    Pt will return to memory care facility with hospice. CSW reached out to facility to make them aware. [employer] notified CSW of their preferred hospice agency, [facility].   CSW reached out to Adrianne with [facility] [phone]) for hospice referral for pt. Adrianne stated they have Epic and would review pt chart. CSW informed hospice that pt will need a hospital bed at facility.   CSW received a call from [place], RN with [facility] inquiring when pt will dc and stated they would reach out to family and began hospice services at pt facility.   CSW currently awaiting to know if hospital bed can be delivered today. If bed can be delivered today, pt can dc back to memory care facility. If not, we will dc pt tomorrow.   TOC will continue to follow.        Expected Discharge Plan and Services         Expected Discharge Date: 01/07/23                                     Social Determinants of Health (SDOH) Interventions SDOH Screenings   Food Insecurity: No Food Insecurity (05/27/2020)  Housing: Low Risk  (05/27/2020)  Transportation Needs: No Transportation Needs (05/27/2020)  Alcohol Screen: Low Risk  (01/07/2022)  Depression (PHQ2-9): Low Risk  (12/22/2021)  Financial Resource Strain: Low Risk  (05/27/2020)  Physical Activity: Inactive (05/27/2020)  Social Connections: Socially Isolated (05/27/2020)  Stress: No Stress Concern Present (05/27/2020)  Tobacco Use: Medium Risk (01/02/2023)    Readmission Risk Interventions     No data to display         Oletta Lamas, MSW, LCSWA, LCASA Transitions of Care  Clinical Social Worker I

## 2023-01-07 NOTE — Discharge Summary (Signed)
Physician Discharge Summary   Patient: Jay Webster MRN: [id number] DOB: 1932/08/23  Admit date:     01/02/2023  Discharge date: 01/07/23  Discharge Physician: Marguerita Merles, DO   PCP: de [place], Buren Kos, MD   Recommendations at discharge:    Further Care per Hospice Protocol at [facility]   Discharge Diagnoses: Principal Problem:   Bradycardia Active Problems:   Essential hypertension   Pulmonary emphysema, unspecified emphysema type (HCC)   Dementia (HCC)   Hypothyroidism   Heart block   Complete heart block (HCC)  Resolved Problems:   * No resolved hospital problems. [facility] Course: [name] was admitted to the hospital with the working diagnosis of bradycardia.    89 yo male with the past medical history of advanced dementia, right bundle branch block, hypertension, and hypothyroidism, who presented with syncope. He is a nursing home resident and history is limited due to advance cognitive impairment. [name] his blood pressure 122/36, HR 36, RR 10 and 02 saturation 98%, lungs with no wheezing, heart with S1 and S2 present and bradycardic with no murmurs, abdomen with no distention and no lower extremity edema.    Na 140, K 4,1 Cl 106, bicarbonate 24. Glucose 104, bun 19 cr 1,0  Mg 2,3  High sensitive troponin 17  Wbc 5,7 hgb 10,2 plt 196  TSH 0,93    Head CT with no acute changes.    EKG 39 bpm, normal axis, qtc 529, complete hear block, with no significant ST segment or T wave changes.    04/29 patient continue with bradycardia, complete heart block. Due to dementia not able to give consent for invasive interventions/ pacemaker.  EP consulted.   Patient seen in consult by EP who are waiting to have a family meeting with healthcare power of attorney on 01/06/2023.    **Interim History After further goals of care discussion with the palliative care team and the EP cardiology team the patient's healthcare power of  attorney has elected to enroll in the patient in hospice at his facility with no further interventions or procedures so patient will not get a permanent pacemaker.  TOC has been notified of hospice consult and he is stable from a medical standpoint to D/C back to Facility with Full Hospice Support. All measures not in the patient's comfort have been discontinued.   Assessment and Plan:  Bradycardia Complete heart block.  -Telemetry personally reviewed, patient continue in 3rd degree AV block with rate up to 30-40s. -Occasional PVCs -BP this morning 139/66.  -2D echocardiogram with preserved LV systolic function with EF 60 to 65%, No LVH, RV with preserved systolic function, no significant valvular disease.  -Continue cardiac monitoring while hospitalized . -Avoid AV nodal blocking agents. -Out of bed to chair tid with meals -Keep K at 4 and Mg at 2.  -Patient seen in consultation by EP who spoke with family and family meeting planned for tomorrow 01/06/2023 as healthcare power of attorney will be coming from out of town. -Per EP patient a poor candidate for permanent pacing given overall clinical trajectory, poor prognosis and advanced dementia. -TSH was 0.932 -Palliative care consultation also placed for further goals of care discussion healthcare power of attorney has decided to not have any further procedures and wishes to enroll the patient in hospice care at the patient's memory care unit where he had been living  [facility]) so now cardiology has signed off. Stable from a  Medical standpoint to D/C to Memory Care Unit with Hospice -Appreciate Further Palliative Care Evaluation and recommendations    Essential Hypertension -BP currently stable.   -Continue to hold antihypertensive medications and continue to Monitor BP per Protocol -Last BP reading was 122/99   Pulmonary Emphysema, unspecified emphysema type (HCC) -Currently stable.   -No signs of exacerbation.   SpO2: 99  % -Continue to Follow for any Clinical Decompensation   Dementia (HCC) Anxiety and Depression Positive decreased hearing.  -Continued modafinil, bupropion and olanzapine. Increased dose of Zyprexa given Agitation and Confusion -Decreased sertraline to 150 mg from 200 mg due to post marketing reports of bradycardia per Dr. Ella Jubilee..  -Lorazepam as needed every 4 hrs.  -Further Care per Hospice Protocol    Hypothyroidism -TSH 0.932.   -Continue current dose of Levothyroxine and have further care per Hospice Protocol    Heart Block -See above.   Normocytic Anemia -Hgb/Hct Trend: Recent Labs  Lab 01/02/23 1144 01/02/23 1747 01/03/23 0040 01/06/23 0107  HGB 10.2* 10.5* 10.0* 11.2*  HCT 31.6* 31.7* 29.4* 33.7*  MCV 89.5 89.3 87.8 88.0  -Continue to Monitor for S/Sx of Bleeding; No overt bleeding noted -Will not repeat CBC anymore given stability and because patient is going back to the facility with Hospice   Consultants: Palliative Care Medicine, Cardiology  Procedures performed: As delineated as above  Disposition:  Memory Care Unit with Hospice  Diet recommendation:  Cardiac diet DISCHARGE MEDICATION: Allergies as of 01/07/2023       Reactions   Penicillins Swelling, Palpitations, Other (See Comments)   Swelling of joints. Has patient had a PCN reaction causing immediate rash, facial/tongue/throat swelling, SOB or lightheadedness with hypotension: Yes Has patient had a PCN reaction causing severe rash involving mucus membranes or skin necrosis: No Has patient had a PCN reaction that required hospitalization: Yes Has patient had a PCN reaction occurring within the last 10 years: No If all of the above answers are "NO", then may proceed with Cephalosporin use.   Shingrix [zoster Vac Recomb Adjuvanted] Rash, Other (See Comments)   Severe rash   Zoloft [sertraline Hcl] Rash        Medication List     STOP taking these medications    traZODone 100 MG  tablet Commonly known as: DESYREL       TAKE these medications    acetaminophen 325 MG tablet Commonly known as: TYLENOL Take 650 mg by mouth every 6 (six) hours as needed for mild pain.   AQUAPHOR OINTMENT BODY EX Apply 1 Application topically See admin instructions. Apply topically to bilateral lower legs once daily for fry skin   aspirin EC 81 MG tablet Take 81 mg by mouth daily. Swallow whole.   buPROPion 150 MG 24 hr tablet Commonly known as: WELLBUTRIN XL Take 1 tablet (150 mg total) by mouth daily.   docusate sodium 100 MG capsule Commonly known as: COLACE Take 1 capsule (100 mg total) by mouth 2 (two) times daily.   donepezil 5 MG tablet Commonly known as: ARICEPT Take 1 tablet (5 mg total) by mouth at bedtime.   Ensure Take 237 mLs by mouth daily.   ferrous sulfate 325 (65 FE) MG EC tablet Take 325 mg by mouth daily with breakfast.   levothyroxine 125 MCG tablet Commonly known as: SYNTHROID TAKE 1 TABLET BY MOUTH EVERY DAY What changed: when to take this   LORazepam 0.5 MG tablet Commonly known as: ATIVAN Take 1 tablet (0.5 mg total) by  mouth every 4 (four) hours as needed for anxiety.   modafinil 200 MG tablet Commonly known as: PROVIGIL Take 1 tablet (200 mg total) by mouth daily.   OLANZapine 5 MG tablet Commonly known as: ZYPREXA Take 1 tablet (5 mg total) by mouth 2 (two) times daily. What changed: when to take this   ondansetron 4 MG tablet Commonly known as: ZOFRAN Take 1 tablet (4 mg total) by mouth every 6 (six) hours as needed for nausea.   polyethylene glycol 17 g packet Commonly known as: MIRALAX / GLYCOLAX Take 17 g by mouth daily as needed for mild constipation.   sertraline 50 MG tablet Commonly known as: ZOLOFT Take 3 tablets (150 mg total) by mouth daily. Start taking on: Jan 08, 2023 What changed:  medication strength how much to take        Discharge Exam: Filed Weights   01/02/23 1734 01/04/23 0338 01/07/23 0603   Weight: 71.1 kg 70.3 kg 70.4 kg   Vitals:   01/07/23 0603 01/07/23 0826  BP:  (!) 122/99  Pulse: (!) 37 88  Resp: 18 18  Temp: (!) 97.5 F (36.4 C)   SpO2: 99%    Examination: Physical Exam:  Constitutional: Slightly overweight chronically ill-appearing elderly Caucasian male who is confused and demented Respiratory: Diminished to auscultation bilaterally, no wheezing, rales, rhonchi or crackles. Normal respiratory effort and patient is not tachypenic. No accessory muscle use.  Unlabored breathing Cardiovascular: Bradycardic rate but regular rhythm.  No visual extremity edema. Abdomen: Soft, non-tender, non-distended. No masses palpated. Bowel sounds positive.  GU: Deferred. Musculoskeletal: No clubbing / cyanosis of digits/nails. No joint deformity upper and lower extremities.  Skin: No rashes, lesions, ulcers on a limited skin evaluation. No induration; Warm and dry.  Neurologic: CN 2-12 grossly intact with no focal deficits. Romberg sign and cerebellar reflexes not assessed.  Psychiatric: Impaired judgment and insight remains significantly confused  Condition at discharge:  Guarded  The results of significant diagnostics from this hospitalization (including imaging, microbiology, ancillary and laboratory) are listed below for reference.   Imaging Studies: ECHOCARDIOGRAM COMPLETE  Result Date: 01/04/2023    ECHOCARDIOGRAM REPORT   Patient Name:   Heber Hoog II Date of Exam: 01/04/2023 Medical Rec #:  [id number]         Height:       64.0 in Accession #:    [id number]        Weight:       155.0 lb Date of Birth:  [birth year]         BSA:          1.755 m Patient Age:    89 years          BP:           113/56 mmHg Patient Gender: M                 HR:           37 bpm. Exam Location:  Inpatient Procedure: 2D Echo, Cardiac Doppler and Color Doppler Indications:    Dyspnea R06.00  History:        Patient has prior history of Echocardiogram examinations, most                 recent  04/13/2018. Arrythmias:Bradycardia; Risk                 Factors:Hypertension.  Sonographer:    Lucendia Herrlich Referring Phys: [id number] MAURICIO DANIEL ARRIEN IMPRESSIONS  1. Pt appears to be in complete heart block at time of study.  2. Left ventricular ejection fraction, by estimation, is 60 to 65%. The left ventricle has normal function. The left ventricle has no regional wall motion abnormalities. Left ventricular diastolic parameters are indeterminate.  3. Right ventricular systolic function is normal. The right ventricular size is normal.  4. The mitral valve is normal in structure. Trivial mitral valve regurgitation. No evidence of mitral stenosis.  5. The aortic valve is tricuspid. Aortic valve regurgitation is not visualized. No aortic stenosis is present.  6. The inferior vena cava is dilated in size with <50% respiratory variability, suggesting right atrial pressure of 15 mmHg. Comparison(s): No prior Echocardiogram. FINDINGS  Left Ventricle: Left ventricular ejection fraction, by estimation, is 60 to 65%. The left ventricle has normal function. The left ventricle has no regional wall motion abnormalities. The left ventricular internal cavity size was normal in size. There is  no left ventricular hypertrophy. Left ventricular diastolic parameters are indeterminate. Right Ventricle: The right ventricular size is normal. Right ventricular systolic function is normal. Left Atrium: Left atrial size was normal in size. Right Atrium: Right atrial size was normal in size. Pericardium: Trivial pericardial effusion is present. Mitral Valve: The mitral valve is normal in structure. Trivial mitral valve regurgitation. No evidence of mitral valve stenosis. Tricuspid Valve: The tricuspid valve is normal in structure. Tricuspid valve regurgitation is mild . No evidence of tricuspid stenosis. Aortic Valve: The aortic valve is tricuspid. Aortic valve regurgitation is not visualized. No aortic stenosis is present. Aortic  valve mean gradient measures 8.0 mmHg. Aortic valve peak gradient measures 17.5 mmHg. Aortic valve area, by VTI measures 1.41  cm. Pulmonic Valve: The pulmonic valve was normal in structure. Pulmonic valve regurgitation is mild. No evidence of pulmonic stenosis. Aorta: The aortic root is normal in size and structure. Venous: The inferior vena cava is dilated in size with less than 50% respiratory variability, suggesting right atrial pressure of 15 mmHg. IAS/Shunts: No atrial level shunt detected by color flow Doppler. Additional Comments: Pt appears to be in complete heart block at time of study.  LEFT VENTRICLE PLAX 2D LVIDd:         4.10 cm   Diastology LVIDs:         2.50 cm   LV e' medial:   8.70 cm/s LV PW:         1.10 cm   LV E/e' medial: 8.3 LV IVS:        0.80 cm LVOT diam:     2.00 cm LV SV:         69 LV SV Index:   40 LVOT Area:     3.14 cm  RIGHT VENTRICLE             IVC RV S prime:     17.40 cm/s  IVC diam: 2.60 cm TAPSE (M-mode): 2.2 cm LEFT ATRIUM             Index        RIGHT ATRIUM           Index LA diam:        3.60 cm 2.05 cm/m   RA Area:     14.00 cm LA Vol (A2C):   34.8 ml 19.82 ml/m  RA Volume:   30.20 ml  17.20 ml/m LA Vol (A4C):   53.9 ml 30.70 ml/m LA Biplane Vol: 46.1 ml 26.26 ml/m  AORTIC VALVE  PULMONIC VALVE AV Area (Vmax):    1.39 cm      PR End Diast Vel: 2.38 msec AV Area (Vmean):   1.56 cm AV Area (VTI):     1.41 cm AV Vmax:           209.00 cm/s AV Vmean:          124.000 cm/s AV VTI:            0.493 m AV Peak Grad:      17.5 mmHg AV Mean Grad:      8.0 mmHg LVOT Vmax:         92.24 cm/s LVOT Vmean:        61.380 cm/s LVOT VTI:          0.221 m LVOT/AV VTI ratio: 0.45  AORTA Ao Root diam: 3.40 cm Ao Asc diam:  3.60 cm MITRAL VALVE                TRICUSPID VALVE MV Area (PHT): 3.42 cm     TR Peak grad:   29.2 mmHg MV Decel Time: 222 msec     TR Vmax:        270.00 cm/s MV E velocity: 72.60 cm/s MV A velocity: 105.00 cm/s  SHUNTS MV E/A ratio:  0.69          Systemic VTI:  0.22 m                             Systemic Diam: 2.00 cm Olga Millers MD Electronically signed by Olga Millers MD Signature Date/Time: 01/04/2023/11:52:28 AM    Final    CT Head Wo Contrast  Result Date: 01/02/2023 CLINICAL DATA:  Unwitnessed fall.  Forehead hematoma. EXAM: CT HEAD WITHOUT CONTRAST TECHNIQUE: Contiguous axial images were obtained from the base of the skull through the vertex without intravenous contrast. RADIATION DOSE REDUCTION: This exam was performed according to the departmental dose-optimization program which includes automated exposure control, adjustment of the mA and/or kV according to patient size and/or use of iterative reconstruction technique. COMPARISON:  11/08/2022 FINDINGS: Brain: There is no evidence for acute hemorrhage, hydrocephalus, mass lesion, or abnormal extra-axial fluid collection. No definite CT evidence for acute infarction. Diffuse loss of parenchymal volume is consistent with atrophy. Patchy low attenuation in the deep hemispheric and periventricular white matter is nonspecific, but likely reflects chronic microvascular ischemic demyelination. Vascular: No hyperdense vessel or unexpected calcification. Skull: No evidence for fracture. No worrisome lytic or sclerotic lesion. Sinuses/Orbits: Paranasal sinuses are obscured by motion artifact but demonstrate no substantial mucosal disease or air-fluid levels. Visualized portions of the globes and intraorbital fat are unremarkable. Other: None. IMPRESSION: 1. No acute intracranial abnormality. 2. Atrophy with chronic small vessel ischemic disease. Electronically Signed   By: Kennith Center M.D.   On: 01/02/2023 12:57    Microbiology: Results for orders placed or performed during the hospital encounter of 01/07/22  Resp Panel by RT-PCR (Flu A&B, Covid) Nasopharyngeal Swab     Status: None   Collection Time: 01/28/22 10:10 AM   Specimen: Nasopharyngeal Swab; Nasopharyngeal(NP) swabs in vial  transport medium  Result Value Ref Range Status   SARS Coronavirus 2 by RT PCR NEGATIVE NEGATIVE Final    Comment: (NOTE) SARS-CoV-2 target nucleic acids are NOT DETECTED.  The SARS-CoV-2 RNA is generally detectable in upper respiratory specimens during the acute phase of infection. The lowest concentration of SARS-CoV-2 viral copies this assay  can detect is 138 copies/mL. A negative result does not preclude SARS-Cov-2 infection and should not be used as the sole basis for treatment or other patient management decisions. A negative result may occur with  improper specimen collection/handling, submission of specimen other than nasopharyngeal swab, presence of viral mutation(s) within the areas targeted by this assay, and inadequate number of viral copies(<138 copies/mL). A negative result must be combined with clinical observations, patient history, and epidemiological information. The expected result is Negative.  Fact Sheet for Patients:  BloggerCourse.com  Fact Sheet for Healthcare Providers:  SeriousBroker.it  This test is no t yet approved or cleared by the [place] FDA and  has been authorized for detection and/or diagnosis of SARS-CoV-2 by FDA under an Emergency Use Authorization (EUA). This EUA will remain  in effect (meaning this test can be used) for the duration of the COVID-19 declaration under Section 564(b)(1) of the Act, 21 U.S.C.section 360bbb-3(b)(1), unless the authorization is terminated  or revoked sooner.       Influenza A by PCR NEGATIVE NEGATIVE Final   Influenza B by PCR NEGATIVE NEGATIVE Final    Comment: (NOTE) The Xpert Xpress SARS-CoV-2/FLU/RSV plus assay is intended as an aid in the diagnosis of influenza from Nasopharyngeal swab specimens and should not be used as a sole basis for treatment. Nasal washings and aspirates are unacceptable for Xpert Xpress SARS-CoV-2/FLU/RSV testing.  Fact  Sheet for Patients: BloggerCourse.com  Fact Sheet for Healthcare Providers: SeriousBroker.it  This test is not yet approved or cleared by the [place] FDA and has been authorized for detection and/or diagnosis of SARS-CoV-2 by FDA under an Emergency Use Authorization (EUA). This EUA will remain in effect (meaning this test can be used) for the duration of the COVID-19 declaration under Section 564(b)(1) of the Act, 21 U.S.C. section 360bbb-3(b)(1), unless the authorization is terminated or revoked.  Performed at [facility], [address] [place], [place] [address]    Labs: CBC: Recent Labs  Lab 01/02/23 1144 01/02/23 1747 01/03/23 0040 01/06/23 0107  WBC 5.7 5.2 6.1 4.6  HGB 10.2* 10.5* 10.0* 11.2*  HCT 31.6* 31.7* 29.4* 33.7*  MCV 89.5 89.3 87.8 88.0  PLT 196 193 176 228   Basic Metabolic Panel: Recent Labs  Lab 01/02/23 1144 01/02/23 1147 01/02/23 1747 01/03/23 0040 01/04/23 0037 01/05/23 0042 01/06/23 0107  NA 140  --   --  139 137 138 140  K 4.1  --   --  3.6 3.7 3.5 3.9  CL 106  --   --  108 106 110 110  CO2 24  --   --  25 24 23 25   GLUCOSE 104*  --   --  92 100* 117* 103*  BUN 19  --   --  17 20 18 14   CREATININE 1.03  --  0.99 1.05 1.12 0.99 0.93  CALCIUM 8.5*  --   --  8.2* 8.2* 8.1* 8.5*  MG  --  2.3  --   --  2.1 2.0 2.1   Liver Function Tests: No results for input(s): "AST", "ALT", "ALKPHOS", "BILITOT", "PROT", "ALBUMIN" in the last 168 hours. CBG: No results for input(s): "GLUCAP" in the last 168 hours.  Discharge time spent: greater than 30 minutes.  Signed: Marguerita Merles, DO [facility] 01/07/2023

## 2023-01-07 NOTE — Progress Notes (Signed)
Patient ID: Jay Webster, male   DOB: 08-Apr-1932, 89 y.o.   MRN: [id number]    Progress Note from the Palliative Medicine Team at [facility]   Patient Name: [name]        Date: 01/07/2023 DOB: 09-20-1931  Age: 89 y.o. MRN#: [id number] Attending Physician: Merlene Laughter, DO Primary Care Physician: de [place], Raymond J, MD Admit Date: 01/02/2023   Medical records reviewed   89 y.o. male  with past medical history of advanced dementia, right bundle branch block, hypertension, and hypothyroidism, who presented with syncope. He is a nursing home resident and history is limited due to advance cognitive impairment.  On workup he was found to be in complete heart block with a heart rate in the 30s.  He was admitted on 01/02/2023 with bradycardia secondary to complete heart block, dementia, and others.    Today is day 5 of this hospitalization.  Plan is for discharge back to facility on full comfort path.  Patient has had continued intermittent confusion.   This morning he is confused but calm, after added dose of Zyprexa.  From chart review he has a significant psychiatric past history.   He is a long time resident of [place] according to [employer] to HPOA/ [employer] by telephone and then in person.  She confirms that comfort and dignity are the focus of care allowing for a natural death.  She presented me with HPOA documents.   A MOST form was completed to reflect full comfort.    Plan of Care:  - DNR/DNI -no artifical feeding or hydration now or in the future - no further diagnostics or life prolonging measures, allow a natural death - symptoms management to enhance comfort          - increased Zyprexa 5 mg to bid  - hospice benefit- prognosis is less than 6 months   -discharge when medically stable    Education offered regarding natural trajectory and expectations at EOL   Questions and concerns addressed   Discussed with Dr Marland Mcalpine, nursing and [facility] team    Time:  30  Detailed review of medical records, medically appropriate exam  discussed with treatment team, counseling and education to patient, family, staff, documenting clinical information, medication management, coordination of care    Lorinda Creed NP  Palliative Medicine Team Team Phone # [phone] Pager [phone]
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
## 2023-01-07 NOTE — Care Management Important Message (Signed)
Important Message  Patient Details  Name: Jay Webster MRN: [id number] Date of Birth: June 11, 1932   Medicare Important Message Given:  Yes     Renie Ora 01/07/2023, 9:35 AM

## 2023-01-08 NOTE — TOC Transition Note (Signed)
Transition of Care [facility]) - CM/SW Discharge Note   Patient Details  Name: Jay Webster MRN: [id number] Date of Birth: 06-11-1932  [facility]) CM/SW Contact:  Leander Rams, LCSW Phone Number: 01/08/2023, 1:24 PM   Clinical Narrative:    Patient will DC to: [facility] Anticipated DC date: 01/08/2023 Family notified: Gavin Pound  Transport by: Sharin Mons   Per MD patient ready for DC to [facility]. RN, patient, patient's family, and facility notified of DC. Discharge Summary and FL2 sent to facility. RN to call report prior to discharge [phone]. DC packet on chart. Ambulance transport requested for patient.   CSW will sign off for now as social work intervention is no longer needed. Please consult [place] again if new needs arise.          Patient Goals and CMS Choice      Discharge Placement                         Discharge Plan and Services Additional resources added to the After Visit Summary for                                       Social Determinants of Health (SDOH) Interventions SDOH Screenings   Food Insecurity: No Food Insecurity (05/27/2020)  Housing: Low Risk  (05/27/2020)  Transportation Needs: No Transportation Needs (05/27/2020)  Alcohol Screen: Low Risk  (01/07/2022)  Depression (PHQ2-9): Low Risk  (12/22/2021)  Financial Resource Strain: Low Risk  (05/27/2020)  Physical Activity: Inactive (05/27/2020)  Social Connections: Socially Isolated (05/27/2020)  Stress: No Stress Concern Present (05/27/2020)  Tobacco Use: Medium Risk (01/02/2023)     Readmission Risk Interventions     No data to display          Oletta Lamas, MSW, LCSWA, LCASA Transitions of Care  Clinical Social Worker I

## 2023-01-08 NOTE — TOC Progression Note (Addendum)
Transition of Care [facility]) - Progression Note    Patient Details  Name: Gauge Skluzacek MRN: [id number] Date of Birth: 05-30-1932  [facility]) CM/SW Contact  Leander Rams, [place] Phone Number: 01/08/2023, 12:31 PM  Clinical Narrative:    [place] with [facility] called CSW to inform her that pt DME will be delievered to facility by noon today.  CSW called pt contact Gavin Pound, to discuss pt dc today back to facility with hospice. Gavin Pound stated she will pick pt up when dc is complete.   12:30PM CSW called Gavin Pound to inform her that pt is ready for dc. Gavin Pound states she is on her way. Facility is ready for pt.    TOC will remain available should any additional needs arise.         Expected Discharge Plan and Services         Expected Discharge Date: 01/07/23                                     Social Determinants of Health (SDOH) Interventions SDOH Screenings   Food Insecurity: No Food Insecurity (05/27/2020)  Housing: Low Risk  (05/27/2020)  Transportation Needs: No Transportation Needs (05/27/2020)  Alcohol Screen: Low Risk  (01/07/2022)  Depression (PHQ2-9): Low Risk  (12/22/2021)  Financial Resource Strain: Low Risk  (05/27/2020)  Physical Activity: Inactive (05/27/2020)  Social Connections: Socially Isolated (05/27/2020)  Stress: No Stress Concern Present (05/27/2020)  Tobacco Use: Medium Risk (01/02/2023)    Readmission Risk Interventions     No data to display         Oletta Lamas, MSW, LCSWA, LCASA Transitions of Care  Clinical Social Worker I

## 2023-01-08 NOTE — Progress Notes (Signed)
Dc instructions reviewed with POA, Debra Citty. Prescription for ATivan given to [facility] as well as copy of [facility] dc documents. POA also made aware of prescription meds to be picked up from [facility].

## 2023-01-08 NOTE — Progress Notes (Signed)
Jay Webster to be D/C'd to [facility] per MD order. Attempted report, Diplomatic Services operational officer at [facility] given return call back phone number. Skin clean and dry.  IV catheter discontinued intact. Site without signs and symptoms of complications. Dressing and pressure applied.  An After Visit Summary was printed and given to PTAR.  Patient escorted via stretcher, and D/C to [place].  [name]  01/08/2023

## 2023-01-08 NOTE — Discharge Summary (Signed)
Physician Discharge Summary   Patient: Jay [facility] MRN: [id number] DOB: 07/10/1932  Admit date:     01/02/2023  Discharge date: 01/08/23  Discharge Physician: Marguerita Merles, DO   PCP: [place], Buren Kos, MD   Recommendations at discharge:    Further Care per Hospice Protocol at [facility]   Discharge Diagnoses: Principal Problem:   Bradycardia Active Problems:   Essential hypertension   Pulmonary emphysema, unspecified emphysema type (HCC)   Dementia (HCC)   Hypothyroidism   Heart block   Complete heart block (HCC)  Resolved Problems:   * No resolved [facility] problems. [facility] Course: [facility] was admitted to the [facility] with the working diagnosis of bradycardia.    89 yo male with the past medical history of advanced dementia, right bundle branch block, hypertension, and hypothyroidism, who presented with syncope. He is a nursing home resident and history is limited due to advance cognitive impairment. [name] his blood pressure 122/36, HR 36, RR 10 and 02 saturation 98%, lungs with no wheezing, heart with S1 and S2 present and bradycardic with no murmurs, abdomen with no distention and no lower extremity edema.    Na 140, K 4,1 Cl 106, bicarbonate 24. Glucose 104, bun 19 cr 1,0  Mg 2,3  High sensitive troponin 17  Wbc 5,7 hgb 10,2 plt 196  TSH 0,93    Head CT with no acute changes.    EKG 39 bpm, normal axis, qtc 529, complete hear block, with no significant ST segment or T wave changes.    04/29 patient continue with bradycardia, complete heart block. Due to dementia not able to give consent for invasive interventions/ pacemaker.  EP consulted.   Patient seen in consult by EP who are waiting to have a family meeting with healthcare power of attorney on 01/06/2023.    **Interim History After further goals of care discussion with the palliative care team and the EP cardiology team the patient's healthcare power of  attorney has elected to enroll in the patient in hospice at his [facility] with no further interventions or procedures so patient will not get a permanent pacemaker.  TOC has been notified of hospice consult and he is stable from a medical standpoint to D/C back to [facility] with Full Hospice Support. All measures not in the patient's comfort have been discontinued.   ADDENDUM 01/08/2023: Patient was not able to D/C yesterday due to Equipment and [facility] bed not being able to be delivered. It is being delivered today. No acute changes overnight and patient resting well and asleep on exam. Remains medically stable for D/C back to [facility] with Hospice.   Assessment and Plan:  Bradycardia Complete heart block.  -Telemetry personally reviewed, patient continue in 3rd degree AV block with rate up to 30-40s. -Occasional PVCs -BP this morning 139/66.  -2D echocardiogram with preserved LV systolic function with EF 60 to 65%, No LVH, RV with preserved systolic function, no significant valvular disease.  -Continue cardiac monitoring while hospitalized . -Avoid AV nodal blocking agents. -Out of bed to chair tid with meals -Keep K at 4 and Mg at 2.  -Patient seen in consultation by EP who spoke with family and family meeting planned for tomorrow 01/06/2023 as healthcare power of attorney will be coming from out of town. -Per EP patient a poor candidate for permanent pacing given overall clinical trajectory, poor prognosis and advanced dementia. -TSH was 0.932 -Palliative care consultation also placed  for further goals of care discussion healthcare power of attorney has decided to not have any further procedures and wishes to enroll the patient in hospice care at the patient's memory care unit where he had been living  [facility]) so now cardiology has signed off. Stable from a Medical standpoint to D/C to Memory Care Unit with Hospice -Appreciate Further Palliative Care Evaluation and recommendations     Essential Hypertension -BP currently stable.   -Continue to hold antihypertensive medications and continue to Monitor BP per Protocol -Last BP reading was 122/99   Pulmonary Emphysema, unspecified emphysema type (HCC) -Currently stable.   -No signs of exacerbation.   SpO2: 96 % -Continue to Follow for any Clinical Decompensation   Dementia (HCC) Anxiety and Depression Positive decreased hearing.  -Continued modafinil, bupropion and olanzapine. Increased dose of Zyprexa given Agitation and Confusion -Decreased sertraline to 150 mg from 200 mg due to post marketing reports of bradycardia per Dr. Ella Jubilee..  -Lorazepam as needed every 4 hrs.  -Further Care per Hospice Protocol    Hypothyroidism -TSH 0.932.   -Continue current dose of Levothyroxine and have further care per Hospice Protocol    Heart Block -See above.   Normocytic Anemia -Hgb/Hct Trend: Recent Labs  Lab 01/02/23 1144 01/02/23 1747 01/03/23 0040 01/06/23 0107  HGB 10.2* 10.5* 10.0* 11.2*  HCT 31.6* 31.7* 29.4* 33.7*  MCV 89.5 89.3 87.8 88.0   -Continue to Monitor for S/Sx of Bleeding; No overt bleeding noted -Will not repeat CBC anymore given stability and because patient is going back to the [facility] with Hospice   Consultants: Palliative Care Medicine, Cardiology  Procedures performed: As delineated as above  Disposition:  Memory Care Unit with Hospice  Diet recommendation:  Cardiac diet DISCHARGE MEDICATION: Allergies as of 01/08/2023       Reactions   Penicillins Swelling, Palpitations, Other (See Comments)   Swelling of joints. Has patient had a PCN reaction causing immediate rash, facial/tongue/throat swelling, SOB or lightheadedness with hypotension: Yes Has patient had a PCN reaction causing severe rash involving mucus membranes or skin necrosis: No Has patient had a PCN reaction that required hospitalization: Yes Has patient had a PCN reaction occurring within the last 10 years: No If all of  the above answers are "NO", then may proceed with Cephalosporin use.   Shingrix [zoster Vac Recomb Adjuvanted] Rash, Other (See Comments)   Severe rash   Zoloft [sertraline Hcl] Rash        Medication List     STOP taking these medications    traZODone 100 MG tablet Commonly known as: DESYREL       TAKE these medications    acetaminophen 325 MG tablet Commonly known as: TYLENOL Take 650 mg by mouth every 6 (six) hours as needed for mild pain.   AQUAPHOR OINTMENT BODY EX Apply 1 Application topically See admin instructions. Apply topically to bilateral lower legs once daily for fry skin   aspirin EC 81 MG tablet Take 81 mg by mouth daily. Swallow whole.   buPROPion 150 MG 24 hr tablet Commonly known as: WELLBUTRIN XL Take 1 tablet (150 mg total) by mouth daily.   docusate sodium 100 MG capsule Commonly known as: COLACE Take 1 capsule (100 mg total) by mouth 2 (two) times daily.   donepezil 5 MG tablet Commonly known as: ARICEPT Take 1 tablet (5 mg total) by mouth at bedtime.   Ensure Take 237 mLs by mouth daily.   ferrous sulfate 325 (65 FE)  MG EC tablet Take 325 mg by mouth daily with breakfast.   levothyroxine 125 MCG tablet Commonly known as: SYNTHROID TAKE 1 TABLET BY MOUTH EVERY DAY What changed: when to take this   LORazepam 0.5 MG tablet Commonly known as: ATIVAN Take 1 tablet (0.5 mg total) by mouth every 4 (four) hours as needed for anxiety.   modafinil 200 MG tablet Commonly known as: PROVIGIL Take 1 tablet (200 mg total) by mouth daily.   OLANZapine 5 MG tablet Commonly known as: ZYPREXA Take 1 tablet (5 mg total) by mouth 2 (two) times daily. What changed: when to take this   ondansetron 4 MG tablet Commonly known as: ZOFRAN Take 1 tablet (4 mg total) by mouth every 6 (six) hours as needed for nausea.   polyethylene glycol 17 g packet Commonly known as: MIRALAX / GLYCOLAX Take 17 g by mouth daily as needed for mild constipation.    sertraline 50 MG tablet Commonly known as: ZOLOFT Take 3 tablets (150 mg total) by mouth daily. What changed:  medication strength how much to take        Discharge Exam: Filed Weights   01/02/23 1734 01/04/23 0338 01/07/23 0603  Weight: 71.1 kg 70.3 kg 70.4 kg   Vitals:   01/08/23 0020 01/08/23 0934  BP: (!) 131/55 (!) 136/59  Pulse: (!) 34 (!) 31  Resp: 18 18  Temp: 97.6 F (36.4 C) 97.6 F (36.4 C)  SpO2: 99% 96%   Examination: Physical Exam:  Constitutional: Slightly overweight chronically ill-appearing elderly Caucasian male who is confused and demented Respiratory: Diminished to auscultation bilaterally, no wheezing, rales, rhonchi or crackles. Normal respiratory effort and patient is not tachypenic. No accessory muscle use.  Unlabored breathing Cardiovascular: Bradycardic rate but regular rhythm.  No visual extremity edema. Abdomen: Soft, non-tender, non-distended. No masses palpated. Bowel sounds positive.  GU: Deferred. Musculoskeletal: No clubbing / cyanosis of digits/nails. No joint deformity upper and lower extremities.  Skin: No rashes, lesions, ulcers on a limited skin evaluation. No induration; Warm and dry.  Neurologic: CN 2-12 grossly intact with no focal deficits. Romberg sign and cerebellar reflexes not assessed.  Psychiatric: Impaired judgment and insight remains significantly confused  Condition at discharge:  Guarded  The results of significant diagnostics from this hospitalization (including imaging, microbiology, ancillary and laboratory) are listed below for reference.   Imaging Studies: ECHOCARDIOGRAM COMPLETE  Result Date: 01/04/2023    ECHOCARDIOGRAM REPORT   Patient Name:   [facility] Date of Exam: 01/04/2023 Medical Rec #:  [id number]         Height:       64.0 in Accession #:    [id number]        Weight:       155.0 lb Date of Birth:  [birth year]         BSA:          1.755 m Patient Age:    89 years          BP:           113/56 mmHg  Patient Gender: M                 HR:           37 bpm. Exam Location:  Inpatient Procedure: 2D Echo, Cardiac Doppler and Color Doppler Indications:    Dyspnea R06.00  History:        Patient has prior history of Echocardiogram examinations, most  recent 04/13/2018. Arrythmias:Bradycardia; Risk                 Factors:Hypertension.  Sonographer:    Lucendia Herrlich Referring Phys: [id number] MAURICIO DANIEL ARRIEN IMPRESSIONS  1. Pt appears to be in complete heart block at time of study.  2. Left ventricular ejection fraction, by estimation, is 60 to 65%. The left ventricle has normal function. The left ventricle has no regional wall motion abnormalities. Left ventricular diastolic parameters are indeterminate.  3. Right ventricular systolic function is normal. The right ventricular size is normal.  4. The mitral valve is normal in structure. Trivial mitral valve regurgitation. No evidence of mitral stenosis.  5. The aortic valve is tricuspid. Aortic valve regurgitation is not visualized. No aortic stenosis is present.  6. The inferior vena cava is dilated in size with <50% respiratory variability, suggesting right atrial pressure of 15 mmHg. Comparison(s): No prior Echocardiogram. FINDINGS  Left Ventricle: Left ventricular ejection fraction, by estimation, is 60 to 65%. The left ventricle has normal function. The left ventricle has no regional wall motion abnormalities. The left ventricular internal cavity size was normal in size. There is  no left ventricular hypertrophy. Left ventricular diastolic parameters are indeterminate. Right Ventricle: The right ventricular size is normal. Right ventricular systolic function is normal. Left Atrium: Left atrial size was normal in size. Right Atrium: Right atrial size was normal in size. Pericardium: Trivial pericardial effusion is present. Mitral Valve: The mitral valve is normal in structure. Trivial mitral valve regurgitation. No evidence of mitral valve  stenosis. Tricuspid Valve: The tricuspid valve is normal in structure. Tricuspid valve regurgitation is mild . No evidence of tricuspid stenosis. Aortic Valve: The aortic valve is tricuspid. Aortic valve regurgitation is not visualized. No aortic stenosis is present. Aortic valve mean gradient measures 8.0 mmHg. Aortic valve peak gradient measures 17.5 mmHg. Aortic valve area, by VTI measures 1.41  cm. Pulmonic Valve: The pulmonic valve was normal in structure. Pulmonic valve regurgitation is mild. No evidence of pulmonic stenosis. Aorta: The aortic root is normal in size and structure. Venous: The inferior vena cava is dilated in size with less than 50% respiratory variability, suggesting right atrial pressure of 15 mmHg. IAS/Shunts: No atrial level shunt detected by color flow Doppler. Additional Comments: Pt appears to be in complete heart block at time of study.  LEFT VENTRICLE PLAX 2D LVIDd:         4.10 cm   Diastology LVIDs:         2.50 cm   LV e' medial:   8.70 cm/s LV PW:         1.10 cm   LV E/e' medial: 8.3 LV IVS:        0.80 cm LVOT diam:     2.00 cm LV SV:         69 LV SV Index:   40 LVOT Area:     3.14 cm  RIGHT VENTRICLE             IVC RV S prime:     17.40 cm/s  IVC diam: 2.60 cm TAPSE (M-mode): 2.2 cm LEFT ATRIUM             Index        RIGHT ATRIUM           Index LA diam:        3.60 cm 2.05 cm/m   RA Area:     14.00 cm LA Vol (A2C):  34.8 ml 19.82 ml/m  RA Volume:   30.20 ml  17.20 ml/m LA Vol (A4C):   53.9 ml 30.70 ml/m LA Biplane Vol: 46.1 ml 26.26 ml/m  AORTIC VALVE                     PULMONIC VALVE AV Area (Vmax):    1.39 cm      PR End Diast Vel: 2.38 msec AV Area (Vmean):   1.56 cm AV Area (VTI):     1.41 cm AV Vmax:           209.00 cm/s AV Vmean:          124.000 cm/s AV VTI:            0.493 m AV Peak Grad:      17.5 mmHg AV Mean Grad:      8.0 mmHg LVOT Vmax:         92.24 cm/s LVOT Vmean:        61.380 cm/s LVOT VTI:          0.221 m LVOT/AV VTI ratio: 0.45  AORTA Ao  Root diam: 3.40 cm Ao Asc diam:  3.60 cm MITRAL VALVE                TRICUSPID VALVE MV Area (PHT): 3.42 cm     TR Peak grad:   29.2 mmHg MV Decel Time: 222 msec     TR Vmax:        270.00 cm/s MV E velocity: 72.60 cm/s MV A velocity: 105.00 cm/s  SHUNTS MV E/A ratio:  0.69         Systemic VTI:  0.22 m                             Systemic Diam: 2.00 cm Olga Millers MD Electronically signed by Olga Millers MD Signature Date/Time: 01/04/2023/11:52:28 AM    Final    CT Head Wo Contrast  Result Date: 01/02/2023 CLINICAL DATA:  Unwitnessed fall.  Forehead hematoma. EXAM: CT HEAD WITHOUT CONTRAST TECHNIQUE: Contiguous axial images were obtained from the base of the skull through the vertex without intravenous contrast. RADIATION DOSE REDUCTION: This exam was performed according to the departmental dose-optimization program which includes automated exposure control, adjustment of the mA and/or kV according to patient size and/or use of iterative reconstruction technique. COMPARISON:  11/08/2022 FINDINGS: Brain: There is no evidence for acute hemorrhage, hydrocephalus, mass lesion, or abnormal extra-axial fluid collection. No definite CT evidence for acute infarction. Diffuse loss of parenchymal volume is consistent with atrophy. Patchy low attenuation in the deep hemispheric and periventricular white matter is nonspecific, but likely reflects chronic microvascular ischemic demyelination. Vascular: No hyperdense vessel or unexpected calcification. Skull: No evidence for fracture. No worrisome lytic or sclerotic lesion. Sinuses/Orbits: Paranasal sinuses are obscured by motion artifact but demonstrate no substantial mucosal disease or air-fluid levels. Visualized portions of the globes and intraorbital fat are unremarkable. Other: None. IMPRESSION: 1. No acute intracranial abnormality. 2. Atrophy with chronic small vessel ischemic disease. Electronically Signed   By: Kennith Center M.D.   On: 01/02/2023 12:57     Microbiology: Results for orders placed or performed during the [facility] encounter of 01/07/22  Resp Panel by RT-PCR (Flu A&B, Covid) Nasopharyngeal Swab     Status: None   Collection Time: 01/28/22 10:10 AM   Specimen: Nasopharyngeal Swab; Nasopharyngeal(NP) swabs in vial transport medium  Result  Value Ref Range Status   SARS Coronavirus 2 by RT PCR NEGATIVE NEGATIVE Final    Comment: (NOTE) SARS-CoV-2 target nucleic acids are NOT DETECTED.  The SARS-CoV-2 RNA is generally detectable in upper respiratory specimens during the acute phase of infection. The lowest concentration of SARS-CoV-2 viral copies this assay can detect is 138 copies/mL. A negative result does not preclude SARS-Cov-2 infection and should not be used as the sole basis for treatment or other patient management decisions. A negative result may occur with  improper specimen collection/handling, submission of specimen other than nasopharyngeal swab, presence of viral mutation(s) within the areas targeted by this assay, and inadequate number of viral copies(<138 copies/mL). A negative result must be combined with clinical observations, patient history, and epidemiological information. The expected result is Negative.  Fact Sheet for Patients:  BloggerCourse.com  Fact Sheet for Healthcare Providers:  SeriousBroker.it  This test is no t yet approved or cleared by the [place] FDA and  has been authorized for detection and/or diagnosis of SARS-CoV-2 by FDA under an Emergency Use Authorization (EUA). This EUA will remain  in effect (meaning this test can be used) for the duration of the COVID-19 declaration under Section 564(b)(1) of the Act, 21 U.S.C.section 360bbb-3(b)(1), unless the authorization is terminated  or revoked sooner.       Influenza A by PCR NEGATIVE NEGATIVE Final   Influenza B by PCR NEGATIVE NEGATIVE Final    Comment: (NOTE) The Xpert  Xpress SARS-CoV-2/FLU/RSV plus assay is intended as an aid in the diagnosis of influenza from Nasopharyngeal swab specimens and should not be used as a sole basis for treatment. Nasal washings and aspirates are unacceptable for Xpert Xpress SARS-CoV-2/FLU/RSV testing.  Fact Sheet for Patients: BloggerCourse.com  Fact Sheet for Healthcare Providers: SeriousBroker.it  This test is not yet approved or cleared by the [place] FDA and has been authorized for detection and/or diagnosis of SARS-CoV-2 by FDA under an Emergency Use Authorization (EUA). This EUA will remain in effect (meaning this test can be used) for the duration of the COVID-19 declaration under Section 564(b)(1) of the Act, 21 U.S.C. section 360bbb-3(b)(1), unless the authorization is terminated or revoked.  Performed at [facility], [address] [place], [place] [address]    Labs: CBC: Recent Labs  Lab 01/02/23 1144 01/02/23 1747 01/03/23 0040 01/06/23 0107  WBC 5.7 5.2 6.1 4.6  HGB 10.2* 10.5* 10.0* 11.2*  HCT 31.6* 31.7* 29.4* 33.7*  MCV 89.5 89.3 87.8 88.0  PLT 196 193 176 228    Basic Metabolic Panel: Recent Labs  Lab 01/02/23 1144 01/02/23 1147 01/02/23 1747 01/03/23 0040 01/04/23 0037 01/05/23 0042 01/06/23 0107  NA 140  --   --  139 137 138 140  K 4.1  --   --  3.6 3.7 3.5 3.9  CL 106  --   --  108 106 110 110  CO2 24  --   --  25 24 23 25   GLUCOSE 104*  --   --  92 100* 117* 103*  BUN 19  --   --  17 20 18 14   CREATININE 1.03  --  0.99 1.05 1.12 0.99 0.93  CALCIUM 8.5*  --   --  8.2* 8.2* 8.1* 8.5*  MG  --  2.3  --   --  2.1 2.0 2.1    Liver Function Tests: No results for input(s): "AST", "ALT", "ALKPHOS", "BILITOT", "PROT", "ALBUMIN" in the last 168 hours. CBG: No results for  input(s): "GLUCAP" in the last 168 hours.  Discharge time spent: greater than 30 minutes.  Signed: Marguerita Merles, DO [facility] 01/08/2023

## 2023-01-08 NOTE — Progress Notes (Signed)
   CHART CHECK NOTE  Referral received for [facility] for goals of care discussion. Chart reviewed and updates received from RN. Patient assessed and is unable to engage appropriately in discussions.   Attempted to contact patient's ***. Unable to reach. Voicemail left with contact information given.   I was able to speak with ***. GOC meeting scheduled for *** @ ***. Family is aware we will meet at patient's bedside.   PMT will re-attempt to contact family at a later time/date. Detailed note and recommendations to follow once GOC has been completed.   Thank you for your referral and allowing [facility] to assist in [facility]'s care.   Wynne Dust, NP Palliative Medicine Team Phone: [phone]  NO CHARGE

## 2023-01-13 DIAGNOSIS — Z9181 History of falling: Secondary | ICD-10-CM | POA: Diagnosis not present

## 2023-01-13 DIAGNOSIS — F03918 Unspecified dementia, unspecified severity, with other behavioral disturbance: Secondary | ICD-10-CM | POA: Diagnosis not present

## 2023-01-22 DIAGNOSIS — F03918 Unspecified dementia, unspecified severity, with other behavioral disturbance: Secondary | ICD-10-CM | POA: Diagnosis not present

## 2023-01-22 DIAGNOSIS — F03911 Unspecified dementia, unspecified severity, with agitation: Secondary | ICD-10-CM | POA: Diagnosis not present

## 2023-01-22 DIAGNOSIS — F59 Unspecified behavioral syndromes associated with physiological disturbances and physical factors: Secondary | ICD-10-CM | POA: Diagnosis not present

## 2023-01-22 DIAGNOSIS — F331 Major depressive disorder, recurrent, moderate: Secondary | ICD-10-CM | POA: Diagnosis not present

## 2023-01-22 DIAGNOSIS — F5104 Psychophysiologic insomnia: Secondary | ICD-10-CM | POA: Diagnosis not present

## 2023-01-22 DIAGNOSIS — F03C4 Unspecified dementia, severe, with anxiety: Secondary | ICD-10-CM | POA: Diagnosis not present

## 2023-01-27 DIAGNOSIS — R197 Diarrhea, unspecified: Secondary | ICD-10-CM | POA: Diagnosis not present

## 2023-02-15 DIAGNOSIS — F03C18 Unspecified dementia, severe, with other behavioral disturbance: Secondary | ICD-10-CM | POA: Diagnosis not present

## 2023-02-15 DIAGNOSIS — F03911 Unspecified dementia, unspecified severity, with agitation: Secondary | ICD-10-CM | POA: Diagnosis not present

## 2023-02-15 DIAGNOSIS — F03C4 Unspecified dementia, severe, with anxiety: Secondary | ICD-10-CM | POA: Diagnosis not present

## 2023-02-15 DIAGNOSIS — F331 Major depressive disorder, recurrent, moderate: Secondary | ICD-10-CM | POA: Diagnosis not present

## 2023-02-15 DIAGNOSIS — F59 Unspecified behavioral syndromes associated with physiological disturbances and physical factors: Secondary | ICD-10-CM | POA: Diagnosis not present

## 2023-02-15 DIAGNOSIS — F03C3 Unspecified dementia, severe, with mood disturbance: Secondary | ICD-10-CM | POA: Diagnosis not present

## 2023-02-15 DIAGNOSIS — F5104 Psychophysiologic insomnia: Secondary | ICD-10-CM | POA: Diagnosis not present

## 2023-02-17 DIAGNOSIS — F03918 Unspecified dementia, unspecified severity, with other behavioral disturbance: Secondary | ICD-10-CM | POA: Diagnosis not present

## 2023-02-18 DIAGNOSIS — Z20818 Contact with and (suspected) exposure to other bacterial communicable diseases: Secondary | ICD-10-CM | POA: Diagnosis not present

## 2023-03-01 DIAGNOSIS — F03C3 Unspecified dementia, severe, with mood disturbance: Secondary | ICD-10-CM | POA: Diagnosis not present

## 2023-03-01 DIAGNOSIS — F59 Unspecified behavioral syndromes associated with physiological disturbances and physical factors: Secondary | ICD-10-CM | POA: Diagnosis not present

## 2023-03-01 DIAGNOSIS — F03C4 Unspecified dementia, severe, with anxiety: Secondary | ICD-10-CM | POA: Diagnosis not present

## 2023-03-01 DIAGNOSIS — F331 Major depressive disorder, recurrent, moderate: Secondary | ICD-10-CM | POA: Diagnosis not present

## 2023-03-01 DIAGNOSIS — F03911 Unspecified dementia, unspecified severity, with agitation: Secondary | ICD-10-CM | POA: Diagnosis not present

## 2023-03-01 DIAGNOSIS — F5104 Psychophysiologic insomnia: Secondary | ICD-10-CM | POA: Diagnosis not present

## 2023-03-01 DIAGNOSIS — F03C18 Unspecified dementia, severe, with other behavioral disturbance: Secondary | ICD-10-CM | POA: Diagnosis not present

## 2023-03-03 DIAGNOSIS — I10 Essential (primary) hypertension: Secondary | ICD-10-CM | POA: Diagnosis not present

## 2023-03-03 DIAGNOSIS — R609 Edema, unspecified: Secondary | ICD-10-CM | POA: Diagnosis not present

## 2023-03-17 DIAGNOSIS — F03C4 Unspecified dementia, severe, with anxiety: Secondary | ICD-10-CM | POA: Diagnosis not present

## 2023-03-17 DIAGNOSIS — F5105 Insomnia due to other mental disorder: Secondary | ICD-10-CM | POA: Diagnosis not present

## 2023-03-17 DIAGNOSIS — F59 Unspecified behavioral syndromes associated with physiological disturbances and physical factors: Secondary | ICD-10-CM | POA: Diagnosis not present

## 2023-03-17 DIAGNOSIS — F331 Major depressive disorder, recurrent, moderate: Secondary | ICD-10-CM | POA: Diagnosis not present

## 2023-03-17 DIAGNOSIS — F03911 Unspecified dementia, unspecified severity, with agitation: Secondary | ICD-10-CM | POA: Diagnosis not present

## 2023-04-21 DIAGNOSIS — F59 Unspecified behavioral syndromes associated with physiological disturbances and physical factors: Secondary | ICD-10-CM | POA: Diagnosis not present

## 2023-04-21 DIAGNOSIS — F03C4 Unspecified dementia, severe, with anxiety: Secondary | ICD-10-CM | POA: Diagnosis not present

## 2023-04-21 DIAGNOSIS — F331 Major depressive disorder, recurrent, moderate: Secondary | ICD-10-CM | POA: Diagnosis not present

## 2023-04-21 DIAGNOSIS — F5105 Insomnia due to other mental disorder: Secondary | ICD-10-CM | POA: Diagnosis not present

## 2023-04-21 DIAGNOSIS — F03911 Unspecified dementia, unspecified severity, with agitation: Secondary | ICD-10-CM | POA: Diagnosis not present

## 2023-04-28 DIAGNOSIS — F03C4 Unspecified dementia, severe, with anxiety: Secondary | ICD-10-CM | POA: Diagnosis not present

## 2023-04-28 DIAGNOSIS — Z79899 Other long term (current) drug therapy: Secondary | ICD-10-CM | POA: Diagnosis not present

## 2023-04-28 DIAGNOSIS — F331 Major depressive disorder, recurrent, moderate: Secondary | ICD-10-CM | POA: Diagnosis not present

## 2023-04-28 DIAGNOSIS — F5105 Insomnia due to other mental disorder: Secondary | ICD-10-CM | POA: Diagnosis not present

## 2023-04-28 DIAGNOSIS — F03C18 Unspecified dementia, severe, with other behavioral disturbance: Secondary | ICD-10-CM | POA: Diagnosis not present

## 2023-05-03 DIAGNOSIS — F03918 Unspecified dementia, unspecified severity, with other behavioral disturbance: Secondary | ICD-10-CM | POA: Diagnosis not present

## 2023-05-03 DIAGNOSIS — F5105 Insomnia due to other mental disorder: Secondary | ICD-10-CM | POA: Diagnosis not present

## 2023-05-03 DIAGNOSIS — F419 Anxiety disorder, unspecified: Secondary | ICD-10-CM | POA: Diagnosis not present

## 2023-05-03 DIAGNOSIS — F331 Major depressive disorder, recurrent, moderate: Secondary | ICD-10-CM | POA: Diagnosis not present

## 2023-05-18 DIAGNOSIS — F419 Anxiety disorder, unspecified: Secondary | ICD-10-CM | POA: Diagnosis not present

## 2023-05-18 DIAGNOSIS — F331 Major depressive disorder, recurrent, moderate: Secondary | ICD-10-CM | POA: Diagnosis not present

## 2023-05-18 DIAGNOSIS — F5105 Insomnia due to other mental disorder: Secondary | ICD-10-CM | POA: Diagnosis not present

## 2023-05-18 DIAGNOSIS — F01C3 Vascular dementia, severe, with mood disturbance: Secondary | ICD-10-CM | POA: Diagnosis not present

## 2023-08-08 DEATH — deceased

## 2023-08-18 ENCOUNTER — Encounter (HOSPITAL_BASED_OUTPATIENT_CLINIC_OR_DEPARTMENT_OTHER): Payer: Self-pay | Admitting: *Deleted
